# Patient Record
Sex: Female | Born: 1955 | Race: White | Hispanic: No | Smoking: Former smoker
Health system: Southern US, Community
[De-identification: ages and names within clinical notes are randomized; demographics above are authoritative.]

## PROBLEM LIST (undated history)

## (undated) DIAGNOSIS — M1990 Unspecified osteoarthritis, unspecified site: Secondary | ICD-10-CM

## (undated) DIAGNOSIS — IMO0002 Reserved for concepts with insufficient information to code with codable children: Secondary | ICD-10-CM

## (undated) DIAGNOSIS — F419 Anxiety disorder, unspecified: Secondary | ICD-10-CM

## (undated) DIAGNOSIS — Z87448 Personal history of other diseases of urinary system: Secondary | ICD-10-CM

## (undated) DIAGNOSIS — E669 Obesity, unspecified: Secondary | ICD-10-CM

## (undated) DIAGNOSIS — I889 Nonspecific lymphadenitis, unspecified: Secondary | ICD-10-CM

## (undated) DIAGNOSIS — F329 Major depressive disorder, single episode, unspecified: Secondary | ICD-10-CM

## (undated) DIAGNOSIS — M545 Low back pain: Secondary | ICD-10-CM

## (undated) DIAGNOSIS — B351 Tinea unguium: Secondary | ICD-10-CM

## (undated) DIAGNOSIS — IMO0001 Reserved for inherently not codable concepts without codable children: Secondary | ICD-10-CM

## (undated) DIAGNOSIS — G2581 Restless legs syndrome: Secondary | ICD-10-CM

## (undated) DIAGNOSIS — R1011 Right upper quadrant pain: Secondary | ICD-10-CM

## (undated) DIAGNOSIS — M25552 Pain in left hip: Secondary | ICD-10-CM

## (undated) DIAGNOSIS — R350 Frequency of micturition: Secondary | ICD-10-CM

## (undated) DIAGNOSIS — Z0001 Encounter for general adult medical examination with abnormal findings: Secondary | ICD-10-CM

## (undated) DIAGNOSIS — E785 Hyperlipidemia, unspecified: Secondary | ICD-10-CM

## (undated) DIAGNOSIS — B354 Tinea corporis: Secondary | ICD-10-CM

## (undated) DIAGNOSIS — R600 Localized edema: Secondary | ICD-10-CM

## (undated) DIAGNOSIS — H6090 Unspecified otitis externa, unspecified ear: Secondary | ICD-10-CM

## (undated) DIAGNOSIS — G4700 Insomnia, unspecified: Secondary | ICD-10-CM

## (undated) DIAGNOSIS — G4730 Sleep apnea, unspecified: Secondary | ICD-10-CM

## (undated) DIAGNOSIS — K219 Gastro-esophageal reflux disease without esophagitis: Secondary | ICD-10-CM

## (undated) DIAGNOSIS — K5900 Constipation, unspecified: Secondary | ICD-10-CM

## (undated) DIAGNOSIS — K1370 Unspecified lesions of oral mucosa: Secondary | ICD-10-CM

## (undated) DIAGNOSIS — N951 Menopausal and female climacteric states: Secondary | ICD-10-CM

## (undated) DIAGNOSIS — F32A Depression, unspecified: Secondary | ICD-10-CM

## (undated) DIAGNOSIS — T7840XA Allergy, unspecified, initial encounter: Secondary | ICD-10-CM

## (undated) DIAGNOSIS — J019 Acute sinusitis, unspecified: Secondary | ICD-10-CM

## (undated) HISTORY — DX: Nonspecific lymphadenitis, unspecified: I88.9

## (undated) HISTORY — DX: Menopausal and female climacteric states: N95.1

## (undated) HISTORY — DX: Personal history of other diseases of urinary system: Z87.448

## (undated) HISTORY — DX: Right upper quadrant pain: R10.11

## (undated) HISTORY — DX: Tinea unguium: B35.1

## (undated) HISTORY — DX: Unspecified otitis externa, unspecified ear: H60.90

## (undated) HISTORY — DX: Insomnia, unspecified: G47.00

## (undated) HISTORY — DX: Reserved for inherently not codable concepts without codable children: IMO0001

## (undated) HISTORY — DX: Allergy, unspecified, initial encounter: T78.40XA

## (undated) HISTORY — DX: Obesity, unspecified: E66.9

## (undated) HISTORY — DX: Restless legs syndrome: G25.81

## (undated) HISTORY — DX: Low back pain: M54.5

## (undated) HISTORY — DX: Reserved for concepts with insufficient information to code with codable children: IMO0002

## (undated) HISTORY — DX: Anxiety disorder, unspecified: F41.9

## (undated) HISTORY — DX: Constipation, unspecified: K59.00

## (undated) HISTORY — DX: Localized edema: R60.0

## (undated) HISTORY — DX: Tinea corporis: B35.4

## (undated) HISTORY — DX: Depression, unspecified: F32.A

## (undated) HISTORY — DX: Sleep apnea, unspecified: G47.30

## (undated) HISTORY — DX: Unspecified osteoarthritis, unspecified site: M19.90

## (undated) HISTORY — DX: Major depressive disorder, single episode, unspecified: F32.9

## (undated) HISTORY — DX: Hyperlipidemia, unspecified: E78.5

## (undated) HISTORY — DX: Frequency of micturition: R35.0

## (undated) HISTORY — DX: Pain in left hip: M25.552

## (undated) HISTORY — DX: Acute sinusitis, unspecified: J01.90

## (undated) HISTORY — DX: Encounter for general adult medical examination with abnormal findings: Z00.01

## (undated) HISTORY — DX: Unspecified lesions of oral mucosa: K13.70

---

## 1991-07-13 HISTORY — PX: TUBAL LIGATION: SHX77

## 1998-07-21 ENCOUNTER — Encounter: Payer: Self-pay | Admitting: *Deleted

## 1998-07-21 ENCOUNTER — Ambulatory Visit (HOSPITAL_COMMUNITY): Admission: RE | Admit: 1998-07-21 | Discharge: 1998-07-21 | Payer: Self-pay | Admitting: *Deleted

## 1999-10-12 ENCOUNTER — Encounter: Payer: Self-pay | Admitting: *Deleted

## 1999-10-12 ENCOUNTER — Ambulatory Visit (HOSPITAL_COMMUNITY): Admission: RE | Admit: 1999-10-12 | Discharge: 1999-10-12 | Payer: Self-pay | Admitting: *Deleted

## 1999-10-20 ENCOUNTER — Encounter: Admission: RE | Admit: 1999-10-20 | Discharge: 1999-10-20 | Payer: Self-pay | Admitting: *Deleted

## 1999-10-20 ENCOUNTER — Encounter: Payer: Self-pay | Admitting: *Deleted

## 1999-11-04 ENCOUNTER — Other Ambulatory Visit: Admission: RE | Admit: 1999-11-04 | Discharge: 1999-11-04 | Payer: Self-pay | Admitting: *Deleted

## 2000-02-12 ENCOUNTER — Ambulatory Visit (HOSPITAL_COMMUNITY): Admission: RE | Admit: 2000-02-12 | Discharge: 2000-02-12 | Payer: Self-pay | Admitting: Internal Medicine

## 2000-02-12 ENCOUNTER — Encounter: Payer: Self-pay | Admitting: Internal Medicine

## 2000-06-13 ENCOUNTER — Encounter: Admission: RE | Admit: 2000-06-13 | Discharge: 2000-07-21 | Payer: Self-pay | Admitting: Podiatry

## 2000-11-08 ENCOUNTER — Emergency Department (HOSPITAL_COMMUNITY): Admission: EM | Admit: 2000-11-08 | Discharge: 2000-11-08 | Payer: Self-pay | Admitting: *Deleted

## 2000-12-29 ENCOUNTER — Encounter: Payer: Self-pay | Admitting: *Deleted

## 2000-12-29 ENCOUNTER — Ambulatory Visit (HOSPITAL_COMMUNITY): Admission: RE | Admit: 2000-12-29 | Discharge: 2000-12-29 | Payer: Self-pay | Admitting: *Deleted

## 2001-01-10 ENCOUNTER — Encounter: Admission: RE | Admit: 2001-01-10 | Discharge: 2001-01-10 | Payer: Self-pay | Admitting: *Deleted

## 2001-01-10 ENCOUNTER — Encounter: Payer: Self-pay | Admitting: *Deleted

## 2001-01-26 ENCOUNTER — Other Ambulatory Visit: Admission: RE | Admit: 2001-01-26 | Discharge: 2001-01-26 | Payer: Self-pay | Admitting: *Deleted

## 2002-01-19 ENCOUNTER — Encounter: Admission: RE | Admit: 2002-01-19 | Discharge: 2002-01-19 | Payer: Self-pay | Admitting: *Deleted

## 2002-01-19 ENCOUNTER — Encounter: Payer: Self-pay | Admitting: *Deleted

## 2002-04-09 ENCOUNTER — Other Ambulatory Visit: Admission: RE | Admit: 2002-04-09 | Discharge: 2002-04-09 | Payer: Self-pay | Admitting: *Deleted

## 2003-02-13 ENCOUNTER — Encounter: Payer: Self-pay | Admitting: *Deleted

## 2003-02-13 ENCOUNTER — Encounter: Admission: RE | Admit: 2003-02-13 | Discharge: 2003-02-13 | Payer: Self-pay | Admitting: *Deleted

## 2003-04-11 ENCOUNTER — Other Ambulatory Visit: Admission: RE | Admit: 2003-04-11 | Discharge: 2003-04-11 | Payer: Self-pay | Admitting: *Deleted

## 2004-02-24 ENCOUNTER — Encounter: Admission: RE | Admit: 2004-02-24 | Discharge: 2004-02-24 | Payer: Self-pay | Admitting: *Deleted

## 2004-04-13 ENCOUNTER — Other Ambulatory Visit: Admission: RE | Admit: 2004-04-13 | Discharge: 2004-04-13 | Payer: Self-pay | Admitting: *Deleted

## 2004-07-12 HISTORY — PX: CARPAL TUNNEL RELEASE: SHX101

## 2004-07-12 HISTORY — PX: BACK SURGERY: SHX140

## 2005-03-31 ENCOUNTER — Encounter: Admission: RE | Admit: 2005-03-31 | Discharge: 2005-03-31 | Payer: Self-pay | Admitting: Obstetrics and Gynecology

## 2005-05-21 ENCOUNTER — Other Ambulatory Visit: Admission: RE | Admit: 2005-05-21 | Discharge: 2005-05-21 | Payer: Self-pay | Admitting: Obstetrics and Gynecology

## 2005-07-08 ENCOUNTER — Emergency Department (HOSPITAL_COMMUNITY): Admission: EM | Admit: 2005-07-08 | Discharge: 2005-07-08 | Payer: Self-pay | Admitting: Emergency Medicine

## 2005-07-25 ENCOUNTER — Ambulatory Visit (HOSPITAL_COMMUNITY): Admission: RE | Admit: 2005-07-25 | Discharge: 2005-07-25 | Payer: Self-pay | Admitting: Specialist

## 2005-08-05 ENCOUNTER — Encounter: Admission: RE | Admit: 2005-08-05 | Discharge: 2005-08-05 | Payer: Self-pay | Admitting: Obstetrics and Gynecology

## 2005-08-25 ENCOUNTER — Ambulatory Visit (HOSPITAL_COMMUNITY): Admission: RE | Admit: 2005-08-25 | Discharge: 2005-08-26 | Payer: Self-pay | Admitting: Specialist

## 2005-09-13 ENCOUNTER — Encounter: Admission: RE | Admit: 2005-09-13 | Discharge: 2005-09-13 | Payer: Self-pay | Admitting: Obstetrics and Gynecology

## 2006-04-25 ENCOUNTER — Encounter: Admission: RE | Admit: 2006-04-25 | Discharge: 2006-04-25 | Payer: Self-pay | Admitting: Obstetrics and Gynecology

## 2006-06-22 ENCOUNTER — Other Ambulatory Visit: Admission: RE | Admit: 2006-06-22 | Discharge: 2006-06-22 | Payer: Self-pay | Admitting: Obstetrics and Gynecology

## 2007-02-09 ENCOUNTER — Encounter: Admission: RE | Admit: 2007-02-09 | Discharge: 2007-02-09 | Payer: Self-pay | Admitting: Internal Medicine

## 2007-03-04 ENCOUNTER — Emergency Department (HOSPITAL_COMMUNITY): Admission: EM | Admit: 2007-03-04 | Discharge: 2007-03-04 | Payer: Self-pay | Admitting: Emergency Medicine

## 2007-05-23 ENCOUNTER — Encounter: Admission: RE | Admit: 2007-05-23 | Discharge: 2007-05-23 | Payer: Self-pay | Admitting: Obstetrics and Gynecology

## 2007-09-07 ENCOUNTER — Encounter: Admission: RE | Admit: 2007-09-07 | Discharge: 2007-11-01 | Payer: Self-pay | Admitting: Orthopedic Surgery

## 2007-09-20 ENCOUNTER — Other Ambulatory Visit: Admission: RE | Admit: 2007-09-20 | Discharge: 2007-09-20 | Payer: Self-pay | Admitting: Obstetrics and Gynecology

## 2008-06-04 ENCOUNTER — Encounter: Admission: RE | Admit: 2008-06-04 | Discharge: 2008-06-04 | Payer: Self-pay | Admitting: Obstetrics and Gynecology

## 2008-10-02 ENCOUNTER — Other Ambulatory Visit: Admission: RE | Admit: 2008-10-02 | Discharge: 2008-10-02 | Payer: Self-pay | Admitting: Obstetrics and Gynecology

## 2008-11-19 ENCOUNTER — Encounter: Payer: Self-pay | Admitting: Gastroenterology

## 2009-01-24 ENCOUNTER — Encounter: Payer: Self-pay | Admitting: Gastroenterology

## 2009-01-28 ENCOUNTER — Ambulatory Visit: Payer: Self-pay | Admitting: Gastroenterology

## 2009-02-06 ENCOUNTER — Ambulatory Visit: Payer: Self-pay | Admitting: Gastroenterology

## 2009-06-12 ENCOUNTER — Encounter: Admission: RE | Admit: 2009-06-12 | Discharge: 2009-06-12 | Payer: Self-pay | Admitting: Obstetrics and Gynecology

## 2009-06-20 ENCOUNTER — Encounter: Admission: RE | Admit: 2009-06-20 | Discharge: 2009-06-20 | Payer: Self-pay | Admitting: Obstetrics and Gynecology

## 2010-03-12 ENCOUNTER — Ambulatory Visit (HOSPITAL_COMMUNITY): Admission: RE | Admit: 2010-03-12 | Discharge: 2010-03-12 | Payer: Self-pay | Admitting: Specialist

## 2010-03-12 LAB — HM PAP SMEAR

## 2010-06-25 ENCOUNTER — Encounter
Admission: RE | Admit: 2010-06-25 | Discharge: 2010-06-25 | Payer: Self-pay | Source: Home / Self Care | Attending: Obstetrics and Gynecology | Admitting: Obstetrics and Gynecology

## 2010-11-27 NOTE — Op Note (Signed)
NAME:  [name], [name]               ACCOUNT NO.:  [id number]   MEDICAL RECORD NO.:  [id number]          PATIENT TYPE:  OIB   LOCATION:  1008                         FACILITY:  [facility]   PHYSICIAN:  Jene Every, M.D.    DATE OF BIRTH:  28-Mar-1956   DATE OF PROCEDURE:  08/25/2005  DATE OF DISCHARGE:                                 OPERATIVE REPORT   PREOPERATIVE DIAGNOSIS:  Spinal stenosis, herniated nucleus pulposus L5-S1  left.   POSTOPERATIVE DIAGNOSIS:  Spinal stenosis, herniated nucleus pulposus L5-S1  left.   PROCEDURE:  1.  Lateral recess decompression L5-S1, foraminotomy at L5 and S1.  2.  Partial excision of posterior element.  3.  Microdiskectomy L5-S1left.   ANESTHESIA:  General.   ASSISTANT:  Roma Schanz, P.A.   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  Operative intervention was indicated for decompression of the S1 nerve root,  bilateral recess decompression, microdiskectomy and foraminotomy. She had  significant disk degeneration at L5-S1, had minimal back pain. The S1 nerve  root was indicated for decompression. The risks and benefits were discussed  including bleeding, infection, injury to vascular structures, CSF leakage,  epidural fibrosis, adjacent segment disease, persistent back pain requiring  fusion in the future, anesthetic complications, etc.   TECHNIQUE:  The patient in the supine position and after the induction of  adequate general anesthesia and 1 gram of Kefzol, she was placed prone on  the [place] frame, all bony prominences well padded. The lumbar region was  prepped and draped in the usual sterile fashion. Two 18 gauge spinal needles  were utilized to localize the 5-1 interspace and confirmed with x-ray. An  incision was then made from the spinous process of 5 to S1, subcutaneous  tissue was dissected, electrocautery was utilized to  achieve hemostasis. The  dorsolumbar fascia identified and divided in line with the skin incision.  The paraspinous muscle was elevated from the lamina of 5 and S1, McCullough  retractor was placed. A Penfield 4 placed in the intralaminar space with an  x-ray confirming the 5-1 interspace. The operating microscope was draped and  brought onto the surgical field. Due to her significant disk degeneration,  there was a narrowing of the intralaminar window. There was an oblique area  of the lamina of 5 with a small osteophyte off of the junction of the lamina  in the spinous process. This was removed with a 2 and a 3 mm Kerrison. Next,  hemilaminotomy was performed on the caudad edge of 5 with a 2 and a 3 mm  Kerrison detaching the ligamentum flavum. The ligamentum flavum was detached  from the cephalad edge of S1 and again there was a fairly oblique  orientation of the lamina here noted. Foraminotomy of S1 was performed. We  detached the ligamentum flavum removing it from the interspace. Gently  mobilized it medially, there was significant lateral recessed stenosis  noted. There was a fairly hard and degenerated disk noted at 5-1. The  foramen of  5 was opened and I passed a hockey stick there without  difficulty. Extensive epidural venous plexus and bipolar electrocautery  utilized to achieve hemostasis here and aid in the lysing of these  adhesions. After mobilization of the thecal sac and the root medially, it  was a hard protruded disk, it was mobilized with an Epstein and a curette.  We gently removed with a pituitary. The disk degeneration was to the point  where it did not pass into the disk space. There was no movement here noted.  A fair amount of calcification of this disk noted. After removal of the  herniated material, it was felt that there was decompression of the S and 5  nerve root by removal of the intralaminar ligamentum flavum and the lateral  recess and foraminotomy.  Inspection revealed no evidence of CSF leakage or  active bleeding. There was at least 1 cm of excursion in the 5-S1 roots  medial to the pedicle without difficulty. The disk space was copiously  irrigated with antibiotic irrigation. I swept beneath the thecal sac at the  foramen of 5-S1 behind the vertebral body of 5 and S1 searching for a free  fragment which was not found. Also in the axilla of the root and the  shoulder of the root. All hernia material had been excised and the residual  ventral prominence was secondary to osteophyte and calcified disk. Again the  disk space was copiously irrigated with antibiotic irrigation. Inspection  revealed no evidence of CSF leakage or active bleeding. Thrombin soaked  Gelfoam was placed in the laminotomy defect. The McCullough retractor was  removed, paraspinous muscles inspected with no evidence of active bleeding.  The dorsolumbar fascia was reapproximated with #0 Vicryl interrupted figure-  of-eight sutures, subcutaneous tissue reapproximated with 2-0 Vicryl simple  sutures. The skin was reapproximated with 4-0 subcuticular chromic, wound  reinforced with Steri-Strips, sterile dressing applied, placed supine on the  hospital bed, extubated without difficulty and transported to the recovery  room in satisfactory condition.   The patient tolerated the procedure well with no complications. Assistant  Roma Schanz, P.A., minimal blood loss.      Jene Every, M.D.  Electronically Signed     JB/MEDQ  D:  08/25/2005  T:  08/26/2005  Job:  [id number]

## 2011-05-06 ENCOUNTER — Ambulatory Visit (INDEPENDENT_AMBULATORY_CARE_PROVIDER_SITE_OTHER): Payer: 59 | Admitting: Family Medicine

## 2011-05-06 ENCOUNTER — Other Ambulatory Visit: Payer: Self-pay | Admitting: Family Medicine

## 2011-05-06 ENCOUNTER — Encounter: Payer: Self-pay | Admitting: Family Medicine

## 2011-05-06 VITALS — BP 100/68 | HR 80 | Temp 97.8°F | Ht 65.0 in | Wt 193.4 lb

## 2011-05-06 DIAGNOSIS — IMO0002 Reserved for concepts with insufficient information to code with codable children: Secondary | ICD-10-CM

## 2011-05-06 DIAGNOSIS — T7840XA Allergy, unspecified, initial encounter: Secondary | ICD-10-CM

## 2011-05-06 DIAGNOSIS — F329 Major depressive disorder, single episode, unspecified: Secondary | ICD-10-CM | POA: Insufficient documentation

## 2011-05-06 DIAGNOSIS — F341 Dysthymic disorder: Secondary | ICD-10-CM

## 2011-05-06 DIAGNOSIS — R609 Edema, unspecified: Secondary | ICD-10-CM

## 2011-05-06 DIAGNOSIS — Z23 Encounter for immunization: Secondary | ICD-10-CM

## 2011-05-06 DIAGNOSIS — Z87448 Personal history of other diseases of urinary system: Secondary | ICD-10-CM | POA: Insufficient documentation

## 2011-05-06 DIAGNOSIS — Z0000 Encounter for general adult medical examination without abnormal findings: Secondary | ICD-10-CM

## 2011-05-06 DIAGNOSIS — L03019 Cellulitis of unspecified finger: Secondary | ICD-10-CM

## 2011-05-06 DIAGNOSIS — B351 Tinea unguium: Secondary | ICD-10-CM

## 2011-05-06 DIAGNOSIS — M545 Low back pain, unspecified: Secondary | ICD-10-CM

## 2011-05-06 DIAGNOSIS — E669 Obesity, unspecified: Secondary | ICD-10-CM

## 2011-05-06 DIAGNOSIS — F419 Anxiety disorder, unspecified: Secondary | ICD-10-CM | POA: Insufficient documentation

## 2011-05-06 DIAGNOSIS — Z79899 Other long term (current) drug therapy: Secondary | ICD-10-CM

## 2011-05-06 DIAGNOSIS — R600 Localized edema: Secondary | ICD-10-CM

## 2011-05-06 DIAGNOSIS — G2581 Restless legs syndrome: Secondary | ICD-10-CM

## 2011-05-06 DIAGNOSIS — IMO0001 Reserved for inherently not codable concepts without codable children: Secondary | ICD-10-CM

## 2011-05-06 DIAGNOSIS — F418 Other specified anxiety disorders: Secondary | ICD-10-CM

## 2011-05-06 MED ORDER — BACITRACIN ZINC 500 UNIT/GM EX OINT: TOPICAL_OINTMENT | CUTANEOUS | Status: DC

## 2011-05-06 NOTE — Patient Instructions (Addendum)

## 2011-05-07 LAB — BASIC METABOLIC PANEL
BUN: 16 mg/dL (ref 6–23)
Calcium: 8.6 mg/dL (ref 8.4–10.5)
Creat: 0.8 mg/dL (ref 0.50–1.10)
Glucose, Bld: 79 mg/dL (ref 70–99)

## 2011-05-07 LAB — HEPATIC FUNCTION PANEL
Albumin: 4.3 g/dL (ref 3.5–5.2)
Bilirubin, Direct: 0.1 mg/dL (ref 0.0–0.3)
Total Bilirubin: 0.5 mg/dL (ref 0.3–1.2)

## 2011-05-07 LAB — PHOSPHORUS: Phosphorus: 2.8 mg/dL (ref 2.3–4.6)

## 2011-05-09 ENCOUNTER — Encounter: Payer: Self-pay | Admitting: Family Medicine

## 2011-05-09 DIAGNOSIS — M545 Low back pain, unspecified: Secondary | ICD-10-CM | POA: Insufficient documentation

## 2011-05-09 DIAGNOSIS — IMO0001 Reserved for inherently not codable concepts without codable children: Secondary | ICD-10-CM | POA: Insufficient documentation

## 2011-05-09 DIAGNOSIS — Z0000 Encounter for general adult medical examination without abnormal findings: Secondary | ICD-10-CM | POA: Insufficient documentation

## 2011-05-09 DIAGNOSIS — F418 Other specified anxiety disorders: Secondary | ICD-10-CM | POA: Insufficient documentation

## 2011-05-09 DIAGNOSIS — B351 Tinea unguium: Secondary | ICD-10-CM

## 2011-05-09 HISTORY — DX: Tinea unguium: B35.1

## 2011-05-09 HISTORY — DX: Low back pain, unspecified: M54.50

## 2011-05-09 NOTE — Assessment & Plan Note (Addendum)
Is pleased with her improvement on multiple meds, no changes today

## 2011-05-09 NOTE — Assessment & Plan Note (Signed)
Agrees to flu shot today, maintain heart healthy diet, increase exercise and wear seat belt

## 2011-05-09 NOTE — Assessment & Plan Note (Signed)
Soak in water and distilled white vinegar daily and apply vick's vapor rub report if no resolution

## 2011-05-09 NOTE — Assessment & Plan Note (Signed)
Soak in warm water and H2O2 daily cover with Bactroban ointment and report if symptoms worsen

## 2011-05-09 NOTE — Assessment & Plan Note (Signed)
Consider the DASH diet increased exercise and decreased po intake

## 2011-05-09 NOTE — Assessment & Plan Note (Signed)
May use OTC Loratadine or Cetirizine as needed

## 2011-05-09 NOTE — Assessment & Plan Note (Signed)
Symptoms c/w atrophic vaginitis is following with GYN she will discuss the possibility of adding Premarin Cream pv weekly to her regimen to see if that is helpful

## 2011-05-09 NOTE — Assessment & Plan Note (Signed)
Minimize sodium, elevate feet, try compression hose and consider diuretics prn

## 2011-05-09 NOTE — Assessment & Plan Note (Signed)
Well controlled on meds 

## 2011-05-09 NOTE — Progress Notes (Signed)
[name]  [id number] May 21, 1956 05/09/2011      Progress Note New Patient  Subjective  Chief Complaint  Chief Complaint  Patient presents with  . Establish Care    new patient    HPI  Patient is a 55 year old female who is in today for new patient appointment. She has a lot of medical problems but is managing them. She sees Dr. Claudia Desanctis if any of her ongoing depression. She takes multiple medications and does believe it is managing her depression. She is frustrated over weight gain. Advised to watch her by mouth intake but is not exercising due to low back pain.Marland Kitchen He denies chest pain, palpitations, shortness of breath, GI or GU complaints. She complains is very in a dry mucosa. Follows with Dr. Webb Silversmith of gynecology. The G2 P1 status post a miscarriage and a C-section. Has a distant history of abnormal Pap but has had normal Paps since her colposcopy was performed. Last Pap was September 2001 and was normal. She is maintained on Prometrium. She is complaining of thickening and yellowing of something else after her trip to the salon. Has some swelling and discomfort along the nailbed of her left fourth finger.  Past Medical History  Diagnosis Date  . Allergy     seasonal  . Anxiety   . Depression   . Hyperlipidemia   . History of proteinuria syndrome   . Edema extremities   . RLS (restless legs syndrome)   . Dyspareunia   . Obese   . Onychomycosis 05/09/2011    Past Surgical History  Procedure Date  . Cesarean section 1993  . Back surgery 2006    low, discectomy for herniated disc    Family History  Problem Relation Age of Onset  . Cancer Mother 52    thyroid  . Arthritis Mother     nreck, back  . Hepatitis Sister     Hepatitis C  . Diabetes Maternal Grandmother     History   Social History  . Marital Status: Married    Spouse Name: N/A    Number of Children: N/A  . Years of Education: N/A   Occupational History  . Not on file.   Social History  Main Topics  . Smoking status: Former Smoker -- 0.5 packs/day for 20 years    Types: Cigarettes    Quit date: 07/12/1992  . Smokeless tobacco: Never Used  . Alcohol Use: Yes     occasionaly  . Drug Use: No  . Sexually Active: Yes -- Female partner(s)   Other Topics Concern  . Not on file   Social History Narrative  . No narrative on file    No current outpatient prescriptions on file prior to visit.    Allergies  Allergen Reactions  . Ceclor (Cefaclor)     Tongue swell    Review of Systems  Review of Systems  Constitutional: Negative for fever, chills, weight loss and malaise/fatigue.  HENT: Negative for hearing loss, nosebleeds and congestion.   Eyes: Negative for discharge.  Respiratory: Negative for cough, sputum production, shortness of breath and wheezing.   Cardiovascular: Positive for leg swelling. Negative for chest pain, palpitations, orthopnea and claudication.  Gastrointestinal: Negative for heartburn, nausea, vomiting, abdominal pain, diarrhea, constipation and blood in stool.  Genitourinary: Negative for dysuria, urgency, frequency and hematuria.  Musculoskeletal: Negative for myalgias, back pain and falls.  Skin: Positive for rash.  Neurological: Negative for dizziness, tremors, sensory change, focal weakness, loss of consciousness, weakness  and headaches.  Endo/Heme/Allergies: Negative for polydipsia. Does not bruise/bleed easily.  Psychiatric/Behavioral: Negative for depression and suicidal ideas. The patient is not nervous/anxious and does not have insomnia.     Objective  BP 100/68  Pulse 80  Temp(Src) 97.8 F (36.6 C) (Oral)  Ht 5\' 5"  (1.651 m)  Wt 193 lb 6.4 oz (87.726 kg)  BMI 32.18 kg/m2  SpO2 98%  Physical Exam  Physical Exam  Constitutional: She is oriented to person, place, and time and well-developed, well-nourished, and in no distress. No distress.  HENT:  Head: Normocephalic and atraumatic.  Right Ear: External ear normal.  Left  Ear: External ear normal.  Nose: Nose normal.  Mouth/Throat: Oropharynx is clear and moist. No oropharyngeal exudate.  Eyes: Conjunctivae are normal. Pupils are equal, round, and reactive to light. Right eye exhibits no discharge. Left eye exhibits no discharge. No scleral icterus.  Neck: Normal range of motion. Neck supple. No thyromegaly present.  Cardiovascular: Normal rate, regular rhythm, normal heart sounds and intact distal pulses.   No murmur heard. Pulmonary/Chest: Effort normal and breath sounds normal. No respiratory distress. She has no wheezes. She has no rales.  Abdominal: Soft. Bowel sounds are normal. She exhibits no distension and no mass. There is no tenderness.  Musculoskeletal: Normal range of motion. She exhibits no edema and no tenderness.  Lymphadenopathy:    She has no cervical adenopathy.  Neurological: She is alert and oriented to person, place, and time. She has normal reflexes. No cranial nerve deficit. Coordination normal.  Skin: Skin is warm and dry. No rash noted. She is not diaphoretic.       Swelling and red around base of nail on 4th finger left hand. No pus. Fingernails yellow and thickened  Psychiatric: Mood, memory and affect normal.       Assessment & Plan  Paronychia of fourth finger of left hand Soak in warm water and H2O2 daily cover with Bactroban ointment and report if symptoms worsen  Onychomycosis Soak in water and distilled white vinegar daily and apply vick's vapor rub report if no resolution  Depression with anxiety Is pleased with her improvement on multiple meds, no changes today  Dyspareunia Symptoms c/w atrophic vaginitis is following with GYN she will discuss the possibility of adding Premarin Cream pv weekly to her regimen to see if that is helpful  Allergic state May use OTC Loratadine or Cetirizine as needed  Obese Consider the DASH diet increased exercise and decreased po intake  Edema extremities Minimize sodium,  elevate feet, try compression hose and consider diuretics prn  Preventative health care Agrees to flu shot today, maintain heart healthy diet, increase exercise and wear seat belt  RLS (restless legs syndrome) Well controlled on meds

## 2011-06-04 ENCOUNTER — Telehealth: Payer: Self-pay | Admitting: Family Medicine

## 2011-06-04 ENCOUNTER — Other Ambulatory Visit: Payer: Self-pay | Admitting: *Deleted

## 2011-06-04 DIAGNOSIS — Z1231 Encounter for screening mammogram for malignant neoplasm of breast: Secondary | ICD-10-CM

## 2011-06-04 NOTE — Telephone Encounter (Signed)
Please advise 

## 2011-06-04 NOTE — Telephone Encounter (Signed)
Patient has been using the medicine that Dr Abner Greenspan prescribed for her fingers. They are getting infected looking, red & swollen. Can a stronger Rx be sent [facility] to [facility]?

## 2011-06-07 ENCOUNTER — Telehealth: Payer: Self-pay | Admitting: Family Medicine

## 2011-06-07 MED ORDER — CEPHALEXIN 500 MG PO CAPS: 500.0000 mg | ORAL_CAPSULE | Freq: Three times a day (TID) | ORAL | Status: DC

## 2011-06-07 NOTE — Telephone Encounter (Signed)
Pt informed and RX sent to pharmacy  

## 2011-06-07 NOTE — Telephone Encounter (Signed)
Addended by: Court Joy on: 06/07/2011 05:03 PM   Modules accepted: Orders

## 2011-06-07 NOTE — Telephone Encounter (Signed)
Worsening Paronychia, patient requesting antibiotics to treat, agree to start one if patient still uncomfortable and she is welcome to come in for further management as needed

## 2011-06-07 NOTE — Telephone Encounter (Signed)
Pt states her middle finger and ring finger are red and swollen around fingernail. Pt states she has been soaking nail and doing everything MD told her to? [facility] [address]

## 2011-06-07 NOTE — Telephone Encounter (Signed)
Ok to start her on Keflex 500mg  po tid x 10 days and she is to call if no improvement

## 2011-06-29 ENCOUNTER — Other Ambulatory Visit: Payer: Self-pay | Admitting: Family Medicine

## 2011-06-29 ENCOUNTER — Ambulatory Visit
Admission: RE | Admit: 2011-06-29 | Discharge: 2011-06-29 | Disposition: A | Discharge status: Home / Self Care | Payer: 59 | Source: Ambulatory Visit | Attending: *Deleted | Admitting: *Deleted

## 2011-06-29 DIAGNOSIS — Z1231 Encounter for screening mammogram for malignant neoplasm of breast: Secondary | ICD-10-CM

## 2011-08-10 ENCOUNTER — Ambulatory Visit: Payer: 59 | Admitting: Family Medicine

## 2011-09-20 ENCOUNTER — Encounter: Payer: Self-pay | Admitting: Family Medicine

## 2011-09-20 ENCOUNTER — Ambulatory Visit (INDEPENDENT_AMBULATORY_CARE_PROVIDER_SITE_OTHER): Payer: PRIVATE HEALTH INSURANCE | Admitting: Family Medicine

## 2011-09-20 VITALS — BP 121/68 | HR 78 | Temp 99.2°F | Ht 65.0 in | Wt 191.8 lb

## 2011-09-20 DIAGNOSIS — R1013 Epigastric pain: Secondary | ICD-10-CM

## 2011-09-20 DIAGNOSIS — R1011 Right upper quadrant pain: Secondary | ICD-10-CM

## 2011-09-20 HISTORY — DX: Right upper quadrant pain: R10.11

## 2011-09-20 MED ORDER — TRAMADOL HCL 50 MG PO TABS: 50.0000 mg | ORAL_TABLET | Freq: Three times a day (TID) | ORAL | Status: DC | PRN

## 2011-09-20 NOTE — Patient Instructions (Signed)
Cholecystitis   Cholecystitis is swelling and irritation (inflammation) of your gallbladder. This often happens when gallstones or sludge build up in the gallbladder. Treatment is needed right away.  HOME CARE  Home care depends on how you were treated. In general:   If you were given antibiotic medicine, take it as told. Finish the medicine even if you start to feel better.   Only take medicines as told by your doctor.   Eat low-fat foods until your next doctor visit.   Keep all doctor visits as told.  GET HELP RIGHT AWAY IF:   You have more pain and medicine does not help.   Your pain moves to a different part of your belly (abdomen) or to your back.   You have a fever.   You feel sick to your stomach (nauseous).   You throw up (vomit).  MAKE SURE YOU:   Understand these instructions.   Will watch your condition.   Will get help right away if you are not doing well or get worse.  Document Released: 06/17/2011 Document Reviewed: 06/15/2011  [employer] Patient Information 2012 [employer]

## 2011-09-20 NOTE — Assessment & Plan Note (Signed)
Intermittent and lasting 1-5 minutes when it occurs, she denies any correlation with eating. Will order a complete US and she should avoid fatty and spicy foods, she is given a small amount of Tramadol for severe pain. Call if symptoms worsen

## 2011-09-20 NOTE — Progress Notes (Signed)
Patient ID: [name], female   DOB: 1956/05/08, 56 y.o.   MRN: [id number] [name] [id number] 1956/01/25 09/20/2011      Progress Note-Follow Up  Subjective  Chief Complaint  Chief Complaint  Patient presents with  . pain in upper right side    radiates to back    HPI  Patient is a 55 year old Caucasian female who is in today with intermittent right upper quadrant pain since mid January. She describes it as lasting one to 5 minutes when it occurs. It is it's sharp sometimes squeezing pain in the anterior or posterior in the right upper quadrant. Tylenol stop helpful. Changing positions does not changed in character or intensity of pain. The pain has been severe enough to cause him nausea time but never any vomiting. She has noted that her bowels slowly continue daily have been somewhat softer lately. No bloody or tarry stool noted. No other c/o. Past Medical History  Diagnosis Date  . Allergy     seasonal  . Anxiety   . Depression   . Hyperlipidemia   . History of proteinuria syndrome   . Edema extremities   . RLS (restless legs syndrome)   . Dyspareunia   . Obese   . Onychomycosis 05/09/2011  . Low back pain 05/09/2011  . RUQ pain 09/20/2011    Past Surgical History  Procedure Date  . Cesarean section 1993  . Back surgery 2006    low, discectomy for herniated disc    Family History  Problem Relation Age of Onset  . Cancer Mother 77    thyroid  . Arthritis Mother     nreck, back  . Hepatitis Sister     Hepatitis C  . Diabetes Maternal Grandmother     History   Social History  . Marital Status: Married    Spouse Name: N/A    Number of Children: N/A  . Years of Education: N/A   Occupational History  . Not on file.   Social History Main Topics  . Smoking status: Former Smoker -- 0.5 packs/day for 20 years    Types: Cigarettes    Quit date: 07/12/1992  . Smokeless tobacco: Never Used  . Alcohol Use: Yes     occasionaly  . Drug Use: No  .  Sexually Active: Yes -- Female partner(s)   Other Topics Concern  . Not on file   Social History Narrative  . No narrative on file    Current Outpatient Prescriptions on File Prior to Visit  Medication Sig Dispense Refill  . ALPRAZolam (XANAX) 0.5 MG tablet Take 0.5 mg by mouth at bedtime as needed.        . Biotin 1000 MCG tablet Take 1,000 mcg by mouth daily.        . Calcium Carbonate-Vitamin D (CALTRATE 600+D) 600-400 MG-UNIT per tablet Take 1 tablet by mouth daily.        . Cholecalciferol (VITAMIN D3) 1000 UNITS CAPS Take by mouth.        . DULoxetine (CYMBALTA) 60 MG capsule Take 60 mg by mouth daily.        . Esomeprazole Magnesium (NEXIUM PO) Take by mouth daily.        Marland Kitchen FLAXSEED, LINSEED, PO Take by mouth.        . furosemide (LASIX) 40 MG tablet Take 40 mg by mouth daily.        Marland Kitchen lamoTRIgine (LAMICTAL) 200 MG tablet Take 200 mg by mouth daily.        Marland Kitchen  Omega-3 Fatty Acids (FISH OIL) 1000 MG CAPS Take 1 capsule by mouth daily.        . potassium chloride SA (K-DUR,KLOR-CON) 20 MEQ tablet Take 20 mEq by mouth daily.        . progesterone (PROMETRIUM) 200 MG capsule Take 200 mg by mouth as directed.        Marland Kitchen QUEtiapine (SEROQUEL) 100 MG tablet Take 100 mg by mouth at bedtime.        Marland Kitchen rOPINIRole (REQUIP) 1 MG tablet Take 1 mg by mouth daily.        . simvastatin (ZOCOR) 20 MG tablet Take 20 mg by mouth at bedtime.        . vitamin E 100 UNIT capsule Take 100 Units by mouth daily.          Allergies  Allergen Reactions  . Ceclor (Cefaclor)     Tongue swell    Review of Systems  Review of Systems  Constitutional: Negative for fever and malaise/fatigue.  HENT: Negative for congestion.   Eyes: Negative for discharge.  Respiratory: Negative for shortness of breath.   Cardiovascular: Negative for chest pain, palpitations and leg swelling.  Gastrointestinal: Positive for heartburn and abdominal pain. Negative for nausea, diarrhea, constipation, blood in stool and melena.         Ruq pain, lasts 1-5 minutes when it occurs and typically occurs at some point every day. No direct correlation to eating or pain are noted.  Genitourinary: Negative for dysuria.  Musculoskeletal: Negative for falls.  Skin: Negative for rash.  Neurological: Negative for loss of consciousness and headaches.  Endo/Heme/Allergies: Negative for polydipsia.  Psychiatric/Behavioral: Negative for depression and suicidal ideas. The patient is not nervous/anxious and does not have insomnia.     Objective  BP 121/68  Pulse 78  Temp(Src) 99.2 F (37.3 C) (Temporal)  Ht 5\' 5"  (1.651 m)  Wt 191 lb 12.8 oz (87 kg)  BMI 31.92 kg/m2  SpO2 99%  Physical Exam  Physical Exam  Constitutional: She is oriented to person, place, and time and well-developed, well-nourished, and in no distress. No distress.  HENT:  Head: Normocephalic and atraumatic.  Eyes: Conjunctivae are normal.  Neck: Neck supple. No thyromegaly present.  Cardiovascular: Normal rate, regular rhythm and normal heart sounds.   No murmur heard. Pulmonary/Chest: Effort normal and breath sounds normal. She has no wheezes.  Abdominal: Soft. Bowel sounds are normal. She exhibits no distension and no mass. There is tenderness. There is no rebound and no guarding.       Mildly tender with palp in epigastrium  Musculoskeletal: She exhibits no edema.  Lymphadenopathy:    She has no cervical adenopathy.  Neurological: She is alert and oriented to person, place, and time.  Skin: Skin is warm and dry. No rash noted. She is not diaphoretic.  Psychiatric: Memory, affect and judgment normal.    No results found for this basename: TSH   No results found for this basename: WBC, HGB, HCT, MCV, PLT   Lab Results  Component Value Date   CREATININE 0.80 05/06/2011   BUN 16 05/06/2011   NA 138 05/06/2011   K 4.2 05/06/2011   CL 102 05/06/2011   CO2 25 05/06/2011   Lab Results  Component Value Date   ALT 11 05/06/2011   AST 17  05/06/2011   ALKPHOS 43 05/06/2011   BILITOT 0.5 05/06/2011      Assessment & Plan  RUQ pain Intermittent and lasting 1-5 minutes  when it occurs, she denies any correlation with eating. Will order a complete US and she should avoid fatty and spicy foods, she is given a small amount of Tramadol for severe pain. Call if symptoms worsen

## 2011-09-22 ENCOUNTER — Ambulatory Visit (HOSPITAL_BASED_OUTPATIENT_CLINIC_OR_DEPARTMENT_OTHER)
Admission: RE | Admit: 2011-09-22 | Discharge: 2011-09-22 | Disposition: A | Discharge status: Home / Self Care | Payer: PRIVATE HEALTH INSURANCE | Source: Ambulatory Visit | Attending: Family Medicine | Admitting: Family Medicine

## 2011-09-22 ENCOUNTER — Telehealth: Payer: Self-pay | Admitting: Family Medicine

## 2011-09-22 DIAGNOSIS — R1013 Epigastric pain: Secondary | ICD-10-CM

## 2011-09-22 DIAGNOSIS — R1011 Right upper quadrant pain: Secondary | ICD-10-CM

## 2011-09-22 DIAGNOSIS — R110 Nausea: Secondary | ICD-10-CM | POA: Insufficient documentation

## 2011-09-22 LAB — HEPATIC FUNCTION PANEL
ALT: 22 U/L (ref 0–35)
AST: 26 U/L (ref 0–37)
Alkaline Phosphatase: 47 U/L (ref 39–117)
Bilirubin, Direct: 0.1 mg/dL (ref 0.0–0.3)
Indirect Bilirubin: 0.4 mg/dL (ref 0.0–0.9)
Total Protein: 6.7 g/dL (ref 6.0–8.3)

## 2011-09-22 NOTE — Progress Notes (Signed)
Addended by: Luisa Dago on: 09/22/2011 10:13 AM   Modules accepted: Orders

## 2011-09-22 NOTE — Telephone Encounter (Signed)
Will inform pt about [place] results

## 2011-09-23 LAB — CBC WITH DIFFERENTIAL/PLATELET
Eosinophils Absolute: 0.2 10*3/uL (ref 0.0–0.7)
Eosinophils Relative: 3 % (ref 0–5)
HCT: 41.2 % (ref 36.0–46.0)
Lymphs Abs: 2 10*3/uL (ref 0.7–4.0)
MCH: 29.2 pg (ref 26.0–34.0)
MCV: 90.5 fL (ref 78.0–100.0)
Monocytes Absolute: 0.7 10*3/uL (ref 0.1–1.0)
Monocytes Relative: 9 % (ref 3–12)
Platelets: 252 10*3/uL (ref 150–400)
RBC: 4.55 MIL/uL (ref 3.87–5.11)

## 2011-11-01 ENCOUNTER — Encounter: Payer: Self-pay | Admitting: Family Medicine

## 2011-11-01 ENCOUNTER — Ambulatory Visit (INDEPENDENT_AMBULATORY_CARE_PROVIDER_SITE_OTHER): Payer: PRIVATE HEALTH INSURANCE | Admitting: Family Medicine

## 2011-11-01 VITALS — BP 120/84 | HR 81 | Temp 98.0°F | Ht 65.0 in | Wt 190.1 lb

## 2011-11-01 DIAGNOSIS — R1011 Right upper quadrant pain: Secondary | ICD-10-CM

## 2011-11-01 DIAGNOSIS — F418 Other specified anxiety disorders: Secondary | ICD-10-CM

## 2011-11-01 DIAGNOSIS — F411 Generalized anxiety disorder: Secondary | ICD-10-CM

## 2011-11-01 DIAGNOSIS — R600 Localized edema: Secondary | ICD-10-CM

## 2011-11-01 DIAGNOSIS — M25559 Pain in unspecified hip: Secondary | ICD-10-CM

## 2011-11-01 DIAGNOSIS — E785 Hyperlipidemia, unspecified: Secondary | ICD-10-CM

## 2011-11-01 DIAGNOSIS — M25552 Pain in left hip: Secondary | ICD-10-CM

## 2011-11-01 DIAGNOSIS — M545 Low back pain: Secondary | ICD-10-CM

## 2011-11-01 DIAGNOSIS — E669 Obesity, unspecified: Secondary | ICD-10-CM

## 2011-11-01 DIAGNOSIS — F341 Dysthymic disorder: Secondary | ICD-10-CM

## 2011-11-01 DIAGNOSIS — R1013 Epigastric pain: Secondary | ICD-10-CM

## 2011-11-01 DIAGNOSIS — Z0000 Encounter for general adult medical examination without abnormal findings: Secondary | ICD-10-CM

## 2011-11-01 DIAGNOSIS — R609 Edema, unspecified: Secondary | ICD-10-CM

## 2011-11-01 DIAGNOSIS — F419 Anxiety disorder, unspecified: Secondary | ICD-10-CM

## 2011-11-01 DIAGNOSIS — R32 Unspecified urinary incontinence: Secondary | ICD-10-CM

## 2011-11-01 HISTORY — DX: Pain in left hip: M25.552

## 2011-11-01 LAB — POCT URINALYSIS DIPSTICK
Ketones, UA: NEGATIVE
Protein, UA: 100
Spec Grav, UA: 1.01
pH, UA: 5

## 2011-11-01 LAB — RENAL FUNCTION PANEL
Albumin: 4.5 g/dL (ref 3.5–5.2)
CO2: 29 mEq/L (ref 19–32)
Chloride: 101 mEq/L (ref 96–112)
Creat: 0.75 mg/dL (ref 0.50–1.10)
Glucose, Bld: 80 mg/dL (ref 70–99)

## 2011-11-01 LAB — LIPID PANEL
Cholesterol: 203 mg/dL — ABNORMAL HIGH (ref 0–200)
LDL Cholesterol: 100 mg/dL — ABNORMAL HIGH (ref 0–99)
Total CHOL/HDL Ratio: 3.7 Ratio
VLDL: 48 mg/dL — ABNORMAL HIGH (ref 0–40)

## 2011-11-01 MED ORDER — TRAMADOL HCL 50 MG PO TABS: 50.0000 mg | ORAL_TABLET | Freq: Three times a day (TID) | ORAL | Status: DC | PRN

## 2011-11-01 MED ORDER — FUROSEMIDE 40 MG PO TABS: ORAL_TABLET | ORAL | Status: DC

## 2011-11-01 MED ORDER — FUROSEMIDE 40 MG PO TABS: 40.0000 mg | ORAL_TABLET | Freq: Every day | ORAL | Status: DC

## 2011-11-01 MED ORDER — OMEPRAZOLE 40 MG PO CPDR: 40.0000 mg | DELAYED_RELEASE_CAPSULE | Freq: Every day | ORAL | Status: DC

## 2011-11-01 MED ORDER — SIMVASTATIN 20 MG PO TABS: 20.0000 mg | ORAL_TABLET | Freq: Every day | ORAL | Status: DC

## 2011-11-01 MED ORDER — ALPRAZOLAM 0.5 MG PO TABS: 0.5000 mg | ORAL_TABLET | Freq: Every evening | ORAL | Status: DC | PRN

## 2011-11-01 MED ORDER — POTASSIUM CHLORIDE CRYS ER 20 MEQ PO TBCR: EXTENDED_RELEASE_TABLET | ORAL | Status: DC

## 2011-11-01 MED ORDER — POTASSIUM CHLORIDE CRYS ER 20 MEQ PO TBCR: 20.0000 meq | EXTENDED_RELEASE_TABLET | Freq: Every day | ORAL | Status: DC

## 2011-11-01 NOTE — Assessment & Plan Note (Signed)
Check fasting labs, continues to follow up with GYN

## 2011-11-01 NOTE — Assessment & Plan Note (Signed)
Improved but still intermittently worsens continue current meds

## 2011-11-01 NOTE — Assessment & Plan Note (Signed)
Encouraged DASH diet and increased exercise 

## 2011-11-01 NOTE — Progress Notes (Signed)
Patient ID: [name], female   DOB: [birth year], 56 y.o.   MRN: [id number] [name] [id number] 14-Aug-1955 11/01/2011      Progress Note New Patient  Subjective  Chief Complaint  Chief Complaint  Patient presents with  . Annual Exam    physical    HPI  Patient is a 56 year old Caucasian female who is in today for an exam. Overall she reports she she is doing relatively well. Her low back pain is still present but is tolerable at this time. She does not chance of increasing left hip pain but is noting adequate relief when she takes an occasional tramadol or Tylenol. No radicular symptoms numbness or weakness down either leg. No incontinence noted. She denies any recent febrile illness and feels well otherwise. She does continue to take an occasional Xanax and needs a refill on this. Continues to struggle with pedal edema. And has done so for 20 years. She has days where the Lasix feels inadequate and her feet feel tight and uncomfortable. She denies shortness of breath, chest pain or palpitations, GI or GU complaints otherwise. She has not made any significant dietary changes although she is trying to move somewhat more. No significant flare in her allergies complains of congestion or headache are noted at today's visit  Past Medical History  Diagnosis Date  . Allergy     seasonal  . Anxiety   . Depression   . Hyperlipidemia   . History of proteinuria syndrome   . Edema extremities   . RLS (restless legs syndrome)   . Dyspareunia   . Obese   . Onychomycosis 05/09/2011  . Low back pain 05/09/2011  . RUQ pain 09/20/2011  . Hip pain, left 11/01/2011    Past Surgical History  Procedure Date  . Cesarean section 1993  . Back surgery 2006    low, discectomy for herniated disc    Family History  Problem Relation Age of Onset  . Cancer Mother 46    thyroid  . Arthritis Mother     nreck, back  . Hepatitis Sister     Hepatitis C  . Diabetes Maternal Grandmother      History   Social History  . Marital Status: Married    Spouse Name: N/A    Number of Children: N/A  . Years of Education: N/A   Occupational History  . Not on file.   Social History Main Topics  . Smoking status: Former Smoker -- 0.5 packs/day for 20 years    Types: Cigarettes    Quit date: 07/12/1992  . Smokeless tobacco: Never Used  . Alcohol Use: Yes     occasionaly  . Drug Use: No  . Sexually Active: Yes -- Female partner(s)   Other Topics Concern  . Not on file   Social History Narrative  . No narrative on file    Current Outpatient Prescriptions on File Prior to Visit  Medication Sig Dispense Refill  . Biotin 1000 MCG tablet Take 1,000 mcg by mouth daily.        . Calcium Carbonate-Vitamin D (CALTRATE 600+D) 600-400 MG-UNIT per tablet Take 1 tablet by mouth daily.        . Cholecalciferol (VITAMIN D3) 1000 UNITS CAPS Take by mouth.        . DULoxetine (CYMBALTA) 60 MG capsule Take 60 mg by mouth daily.        Marland Kitchen FLAXSEED, LINSEED, PO Take by mouth.        Marland Kitchen  lamoTRIgine (LAMICTAL) 200 MG tablet Take 200 mg by mouth daily.        . Omega-3 Fatty Acids (FISH OIL) 1000 MG CAPS Take 1 capsule by mouth daily.        . progesterone (PROMETRIUM) 200 MG capsule Take 200 mg by mouth as directed.        Marland Kitchen QUEtiapine (SEROQUEL) 100 MG tablet Take 100 mg by mouth at bedtime.        Marland Kitchen rOPINIRole (REQUIP) 1 MG tablet Take 1 mg by mouth daily.        . vitamin E 100 UNIT capsule Take 100 Units by mouth daily.        Marland Kitchen DISCONTD: furosemide (LASIX) 40 MG tablet Take 40 mg by mouth daily.        Marland Kitchen DISCONTD: potassium chloride SA (K-DUR,KLOR-CON) 20 MEQ tablet Take 20 mEq by mouth daily.        Marland Kitchen DISCONTD: simvastatin (ZOCOR) 20 MG tablet Take 20 mg by mouth at bedtime.        Marland Kitchen omeprazole (PRILOSEC) 40 MG capsule Take 1 capsule (40 mg total) by mouth daily.  30 capsule  5    Allergies  Allergen Reactions  . Ceclor (Cefaclor)     Tongue swell    Review of Systems  Review  of Systems  Constitutional: Negative for fever, chills and malaise/fatigue.  HENT: Negative for hearing loss, nosebleeds and congestion.   Eyes: Negative for discharge.  Respiratory: Negative for cough, sputum production, shortness of breath and wheezing.   Cardiovascular: Positive for leg swelling. Negative for chest pain and palpitations.  Gastrointestinal: Negative for heartburn, nausea, vomiting, abdominal pain, diarrhea, constipation and blood in stool.  Genitourinary: Negative for dysuria, urgency, frequency and hematuria.  Musculoskeletal: Negative for myalgias, back pain and falls.  Skin: Negative for rash.  Neurological: Negative for dizziness, tremors, sensory change, focal weakness, loss of consciousness, weakness and headaches.  Endo/Heme/Allergies: Negative for polydipsia. Does not bruise/bleed easily.  Psychiatric/Behavioral: Negative for depression and suicidal ideas. The patient is not nervous/anxious and does not have insomnia.     Objective  BP 120/84  Pulse 81  Temp(Src) 98 F (36.7 C) (Temporal)  Ht 5\' 5"  (1.651 m)  Wt 190 lb 1.9 oz (86.238 kg)  BMI 31.64 kg/m2  SpO2 99%  Physical Exam  Physical Exam  Constitutional: She is oriented to person, place, and time and well-developed, well-nourished, and in no distress. No distress.  HENT:  Head: Normocephalic and atraumatic.  Right Ear: External ear normal.  Left Ear: External ear normal.  Nose: Nose normal.  Mouth/Throat: Oropharynx is clear and moist. No oropharyngeal exudate.  Eyes: Conjunctivae are normal. Pupils are equal, round, and reactive to light. Right eye exhibits no discharge. Left eye exhibits no discharge. No scleral icterus.  Neck: Normal range of motion. Neck supple. No thyromegaly present.  Cardiovascular: Normal rate, regular rhythm, normal heart sounds and intact distal pulses.   No murmur heard. Pulmonary/Chest: Effort normal and breath sounds normal. No respiratory distress. She has no  wheezes. She has no rales.  Abdominal: Soft. Bowel sounds are normal. She exhibits no distension and no mass. There is no tenderness.  Musculoskeletal: Normal range of motion. She exhibits edema. She exhibits no tenderness.       1+ pedal edema b/l LE  Lymphadenopathy:    She has no cervical adenopathy.  Neurological: She is alert and oriented to person, place, and time. She has normal reflexes. No cranial nerve  deficit. Coordination normal.  Skin: Skin is warm and dry. No rash noted. She is not diaphoretic.  Psychiatric: Mood, memory and affect normal.       Assessment & Plan  Hip pain, left Left hip pain is tolerable with intermittent Tramadol and Tylenol. Report if symptoms worsen  Low back pain Improved but still intermittently worsens continue current meds   Depression with anxiety Doing well on current meds, no concerns today  Obese Encouraged DASH diet and increased exercise  Edema extremities Has a long history of edema and some days are worse, is give permission to take an extra Lasix and Potassium daily as needed  Preventative health care Check fasting labs, continues to follow up with GYN

## 2011-11-01 NOTE — Assessment & Plan Note (Signed)
Has a long history of edema and some days are worse, is give permission to take an extra Lasix and Potassium daily as needed

## 2011-11-01 NOTE — Assessment & Plan Note (Signed)
Left hip pain is tolerable with intermittent Tramadol and Tylenol. Report if symptoms worsen

## 2011-11-01 NOTE — Assessment & Plan Note (Signed)
Doing well on current meds, no concerns today

## 2011-11-01 NOTE — Patient Instructions (Signed)
Allergic Rhinitis Allergic rhinitis is when the mucous membranes in the nose respond to allergens. Allergens are particles in the air that cause your body to have an allergic reaction. This causes you to release allergic antibodies. Through a chain of events, these eventually cause you to release histamine into the blood stream (hence the use of antihistamines). Although meant to be protective to the body, it is this release that causes your discomfort, such as frequent sneezing, congestion and an itchy runny nose.  CAUSES  The pollen allergens may come from grasses, trees, and weeds. This is seasonal allergic rhinitis, or "hay fever." Other allergens cause year-round allergic rhinitis (perennial allergic rhinitis) such as house dust mite allergen, pet dander and mold spores.  SYMPTOMS   Nasal stuffiness (congestion).   Runny, itchy nose with sneezing and tearing of the eyes.   There is often an itching of the mouth, eyes and ears.  It cannot be cured, but it can be controlled with medications. DIAGNOSIS  If you are unable to determine the offending allergen, skin or blood testing may find it. TREATMENT   Avoid the allergen.   Medications and allergy shots (immunotherapy) can help.   Hay fever may often be treated with antihistamines in pill or nasal spray forms. Antihistamines block the effects of histamine. There are over-the-counter medicines that may help with nasal congestion and swelling around the eyes. Check with your caregiver before taking or giving this medicine.  If the treatment above does not work, there are many new medications your caregiver can prescribe. Stronger medications may be used if initial measures are ineffective. Desensitizing injections can be used if medications and avoidance fails. Desensitization is when a patient is given ongoing shots until the body becomes less sensitive to the allergen. Make sure you follow up with your caregiver if problems continue. SEEK  MEDICAL CARE IF:   You develop fever (more than 100.5 F (38.1 C).   You develop a cough that does not stop easily (persistent).   You have shortness of breath.   You start wheezing.   Symptoms interfere with normal daily activities.  Document Released: 03/23/2001 Document Revised: 06/17/2011 Document Reviewed: 10/02/2008 [facility] Patient Information 2012 [place], [place].  Try nasal saline twice daily, use Sudafed sparingly, increase Claritin to twice daily and notify us if not improving

## 2011-11-03 ENCOUNTER — Other Ambulatory Visit: Payer: Self-pay

## 2011-11-03 DIAGNOSIS — R1013 Epigastric pain: Secondary | ICD-10-CM

## 2011-11-03 LAB — URINE CULTURE
Colony Count: NO GROWTH
Organism ID, Bacteria: NO GROWTH

## 2011-11-03 NOTE — Telephone Encounter (Signed)
Pt informed that Omeprazole was sent to [facility].

## 2012-03-16 ENCOUNTER — Telehealth: Payer: Self-pay

## 2012-03-16 NOTE — Telephone Encounter (Signed)
I informed patient and she states that she will call Monday if not any better. I asked pt if she was sure she wanted to wait all weekend and she stated the headache she had this morning has eased up a little. Then patient stated that if it gets any worse tonight she would call in the morning to schedule an appt.

## 2012-03-16 NOTE — Telephone Encounter (Signed)
For a headache lasting several weeks I cannot give her good information/advice/meds unless I see her and do an exam. Can remind her to eat small, frequent meals with lean proteins and complex carbs (low or hi sugars can make HA worse), hydrate well with at least 64 oz of fluids daily and get 8 hours of sleep. Have her come in

## 2012-03-16 NOTE — Telephone Encounter (Signed)
Patient left a message stating that she has had a headache for the last several weeks. Pt also stated she took her BP and it was 120/66 and pulse 82. Patient stated she was scared about her pulse being 82?  I called patient and she states she was nauseas yesterday. Patient states that she saw spots yesterday with headache. Headache has been for 2 weeks. I informed pt that the pulse was okay and at her appt in April it was 19. Pt stated that made her feel better. I asked patient if she would like to come in and see one of the doctors today and she stated she would wait till after the weekend to see how she felt. I informed pt that if the headache got worse over the weekend and she was seeing spots, back, jaw or arm pain to go to ED. Pt voiced understanding.

## 2012-04-27 ENCOUNTER — Other Ambulatory Visit (INDEPENDENT_AMBULATORY_CARE_PROVIDER_SITE_OTHER): Payer: PRIVATE HEALTH INSURANCE

## 2012-04-27 DIAGNOSIS — E785 Hyperlipidemia, unspecified: Secondary | ICD-10-CM

## 2012-04-27 LAB — LIPID PANEL
HDL: 43.2 mg/dL (ref >39.00)
VLDL: 61.6 mg/dL — ABNORMAL HIGH (ref 0.0–40.0)

## 2012-04-27 LAB — LDL CHOLESTEROL, DIRECT: Direct LDL: 90.4 mg/dL

## 2012-05-02 ENCOUNTER — Ambulatory Visit (INDEPENDENT_AMBULATORY_CARE_PROVIDER_SITE_OTHER): Payer: PRIVATE HEALTH INSURANCE | Admitting: Family Medicine

## 2012-05-02 ENCOUNTER — Encounter: Payer: Self-pay | Admitting: Family Medicine

## 2012-05-02 VITALS — BP 82/56 | HR 82 | Temp 97.9°F | Ht 65.0 in | Wt 192.4 lb

## 2012-05-02 DIAGNOSIS — M25551 Pain in right hip: Secondary | ICD-10-CM

## 2012-05-02 DIAGNOSIS — M79662 Pain in left lower leg: Secondary | ICD-10-CM

## 2012-05-02 DIAGNOSIS — R600 Localized edema: Secondary | ICD-10-CM

## 2012-05-02 DIAGNOSIS — M79609 Pain in unspecified limb: Secondary | ICD-10-CM

## 2012-05-02 DIAGNOSIS — M545 Low back pain, unspecified: Secondary | ICD-10-CM

## 2012-05-02 DIAGNOSIS — I10 Essential (primary) hypertension: Secondary | ICD-10-CM

## 2012-05-02 DIAGNOSIS — M25559 Pain in unspecified hip: Secondary | ICD-10-CM

## 2012-05-02 DIAGNOSIS — R1013 Epigastric pain: Secondary | ICD-10-CM

## 2012-05-02 DIAGNOSIS — R609 Edema, unspecified: Secondary | ICD-10-CM

## 2012-05-02 DIAGNOSIS — E669 Obesity, unspecified: Secondary | ICD-10-CM

## 2012-05-02 DIAGNOSIS — Z23 Encounter for immunization: Secondary | ICD-10-CM

## 2012-05-02 DIAGNOSIS — R1011 Right upper quadrant pain: Secondary | ICD-10-CM

## 2012-05-02 DIAGNOSIS — M25552 Pain in left hip: Secondary | ICD-10-CM

## 2012-05-02 DIAGNOSIS — E785 Hyperlipidemia, unspecified: Secondary | ICD-10-CM

## 2012-05-02 MED ORDER — TRAMADOL HCL 50 MG PO TABS: 50.0000 mg | ORAL_TABLET | Freq: Three times a day (TID) | ORAL | Status: AC | PRN

## 2012-05-02 MED ORDER — FUROSEMIDE 40 MG PO TABS: ORAL_TABLET | ORAL | Status: DC

## 2012-05-02 MED ORDER — POTASSIUM CHLORIDE CRYS ER 20 MEQ PO TBCR: EXTENDED_RELEASE_TABLET | ORAL | Status: DC

## 2012-05-02 NOTE — Progress Notes (Signed)
Patient ID: [name], female   DOB: 1955-12-30, 56 y.o.   MRN: [id number] MALACHI SUDERMAN [id number] 06/05/1956 05/02/2012      Progress Note-Follow Up  Subjective  Chief Complaint  Chief Complaint  Patient presents with  . Follow-up    6 month  . Injections    flu    HPI  Patient is a 56 year old Caucasian female who is in today for followup with a few concerns. She notes roughly 4-6 weeks ago she jammed her second third and fourth toe on her left foot. She went to physiatry and her x-ray and she is unclear whether there was a fracture or not. It is slowly improving but she's had increased pedal edema in that leg ever since. Does complain of some posterior calf and posterior knee pain as well. She is persistent edema in both lower extremities which has responded to an increase in Lasix to 2 tabs daily she does have to do this very frequently now. She has up her potassium to 2 tabs daily as well and denies any chest pain, shortness of breath, palpitations, leg cramps or other new acute complaints today. She does have ongoing low back pain which has been present for years as well as bilateral hip pain. Left is worse than right. She has trouble finding a comfortable position at night secondary to being unable to lie in her left side at this time. She does get some relief with intermittent use of tramadol but is ready to proceed with orthopedic consultation and possible physical therapy for her persistent pain  Past Medical History  Diagnosis Date  . Allergy     seasonal  . Anxiety   . Depression   . Hyperlipidemia   . History of proteinuria syndrome   . Edema extremities   . RLS (restless legs syndrome)   . Dyspareunia   . Obese   . Onychomycosis 05/09/2011  . Low back pain 05/09/2011  . RUQ pain 09/20/2011  . Hip pain, left 11/01/2011    Past Surgical History  Procedure Date  . Cesarean section 1993  . Back surgery 2006    low, discectomy for herniated disc    Family  History  Problem Relation Age of Onset  . Cancer Mother 13    thyroid  . Arthritis Mother     nreck, back  . Hepatitis Sister     Hepatitis C  . Diabetes Maternal Grandmother     History   Social History  . Marital Status: Married    Spouse Name: N/A    Number of Children: N/A  . Years of Education: N/A   Occupational History  . Not on file.   Social History Main Topics  . Smoking status: Former Smoker -- 0.5 packs/day for 20 years    Types: Cigarettes    Quit date: 07/12/1992  . Smokeless tobacco: Never Used  . Alcohol Use: Yes     occasionaly  . Drug Use: No  . Sexually Active: Yes -- Female partner(s)   Other Topics Concern  . Not on file   Social History Narrative  . No narrative on file    Current Outpatient Prescriptions on File Prior to Visit  Medication Sig Dispense Refill  . ALPRAZolam (XANAX) 0.5 MG tablet Take 1 tablet (0.5 mg total) by mouth at bedtime as needed for sleep or anxiety.  30 tablet  5  . Calcium Carbonate-Vitamin D (CALTRATE 600+D) 600-400 MG-UNIT per tablet Take 1 tablet by mouth daily.       Marland Kitchen  Cholecalciferol (VITAMIN D3) 1000 UNITS CAPS Take by mouth.        Marland Kitchen FLAXSEED, LINSEED, PO Take by mouth every other day.       . lamoTRIgine (LAMICTAL) 200 MG tablet Take 200 mg by mouth daily.        Marland Kitchen omeprazole (PRILOSEC) 40 MG capsule Take 1 capsule (40 mg total) by mouth daily.  30 capsule  5  . progesterone (PROMETRIUM) 200 MG capsule Take 200 mg by mouth as directed.        Marland Kitchen QUEtiapine (SEROQUEL) 100 MG tablet Take 100 mg by mouth at bedtime.        Marland Kitchen rOPINIRole (REQUIP) 1 MG tablet Take 1 mg by mouth daily.        . simvastatin (ZOCOR) 20 MG tablet Take 1 tablet (20 mg total) by mouth at bedtime.  30 tablet  11  . venlafaxine XR (EFFEXOR-XR) 150 MG 24 hr capsule Take 150 mg by mouth daily.      Marland Kitchen DISCONTD: furosemide (LASIX) 40 MG tablet 1 tab po daily and a 2 nd tab daily prn increased edema  45 tablet  5  . DISCONTD: potassium chloride SA  (K-DUR,KLOR-CON) 20 MEQ tablet 1 tab po daily and a 2nd prn increased Lasix use  45 tablet  5    Allergies  Allergen Reactions  . Ceclor (Cefaclor)     Tongue swell    Review of Systems  Review of Systems  Constitutional: Negative for fever and malaise/fatigue.  HENT: Negative for congestion.   Eyes: Negative for discharge.  Respiratory: Negative for shortness of breath.   Cardiovascular: Positive for leg swelling. Negative for chest pain, palpitations and PND.  Gastrointestinal: Negative for nausea, abdominal pain and diarrhea.  Genitourinary: Negative for dysuria.  Musculoskeletal: Positive for myalgias, back pain and joint pain. Negative for falls.  Skin: Negative for rash.  Neurological: Negative for loss of consciousness and headaches.  Endo/Heme/Allergies: Negative for polydipsia.  Psychiatric/Behavioral: Negative for depression and suicidal ideas. The patient is not nervous/anxious and does not have insomnia.     Objective  BP 82/56  Pulse 82  Temp 97.9 F (36.6 C) (Temporal)  Ht 5\' 5"  (1.651 m)  Wt 192 lb 6.4 oz (87.272 kg)  BMI 32.02 kg/m2  SpO2 98%  Physical Exam  Physical Exam  Constitutional: She is oriented to person, place, and time and well-developed, well-nourished, and in no distress. No distress.  HENT:  Head: Normocephalic and atraumatic.  Eyes: Conjunctivae normal are normal.  Neck: Neck supple. No thyromegaly present.  Cardiovascular: Normal rate, regular rhythm and normal heart sounds.   No murmur heard. Pulmonary/Chest: Effort normal and breath sounds normal. She has no wheezes.  Abdominal: Bowel sounds are normal. She exhibits no distension and no mass.  Musculoskeletal: She exhibits edema and tenderness.       1-2 + pedal edema RLE, 2+ pedal edema LLE, mild pain with palp posterior left knee with swelling. Ecchymosis 2nd and 3 rd toe left foot  Lymphadenopathy:    She has no cervical adenopathy.  Neurological: She is alert and oriented to  person, place, and time.  Skin: Skin is warm and dry. No rash noted. She is not diaphoretic.  Psychiatric: Memory, affect and judgment normal.    Lab Results  Component Value Date   TSH 1.202 11/01/2011   Lab Results  Component Value Date   WBC 6.9 09/22/2011   HGB 13.3 09/22/2011   HCT 41.2 09/22/2011  MCV 90.5 09/22/2011   PLT 252 09/22/2011   Lab Results  Component Value Date   CREATININE 0.75 11/01/2011   BUN 14 11/01/2011   NA 139 11/01/2011   K 4.0 11/01/2011   CL 101 11/01/2011   CO2 29 11/01/2011   Lab Results  Component Value Date   ALT 22 09/22/2011   AST 26 09/22/2011   ALKPHOS 47 09/22/2011   BILITOT 0.5 09/22/2011   Lab Results  Component Value Date   CHOL 169 04/27/2012   Lab Results  Component Value Date   HDL 43.20 04/27/2012   Lab Results  Component Value Date   LDLCALC 100* 11/01/2011   Lab Results  Component Value Date   TRIG 308.0* 04/27/2012   Lab Results  Component Value Date   CHOLHDL 4 04/27/2012     Assessment & Plan  Edema extremities Worsening. Is not wearing the compression hose as recommended. She's not been watching her diet significantly. She has increased her furosemide to 2 pills daily along with two potassium tablets daily and does feel that helps some. Refills given today. We'll check a 2-D echo due to her hypertension and her worsening edema as well. Left leg is worse and she is having some discomfort and pain in posterior calf and behind knee. This started after she jammed her toes badly in this leg 4-6 weeks ago. Will check a lower extremity doppler  Hip pain, left Has trouble getting comfortable even at night, agrees to xray and referral to ortho at this time, is given a refill on Tramadol to use prn til then which she notes does help  Low back pain Persistence, encouraged ongoing attempts at weight loss and referred to ortho for further consideration  Obese Weight stable over past year. Encouraged DASH diet and decreased po  intake  Hyperlipidemia TGs up, avoid trans fats, minimize saturated fats and simple carbs, add a Krill oil cap daily and recheck in 3 months

## 2012-05-02 NOTE — Assessment & Plan Note (Signed)
Worsening. Is not wearing the compression hose as recommended. She's not been watching her diet significantly. She has increased her furosemide to 2 pills daily along with two potassium tablets daily and does feel that helps some. Refills given today. We'll check a 2-D echo due to her hypertension and her worsening edema as well. Left leg is worse and she is having some discomfort and pain in posterior calf and behind knee. This started after she jammed her toes badly in this leg 4-6 weeks ago. Will check a lower extremity doppler

## 2012-05-02 NOTE — Assessment & Plan Note (Signed)
Weight stable over past year. Encouraged DASH diet and decreased po intake

## 2012-05-02 NOTE — Assessment & Plan Note (Signed)
Persistence, encouraged ongoing attempts at weight loss and referred to ortho for further consideration

## 2012-05-02 NOTE — Assessment & Plan Note (Signed)
TGs up, avoid trans fats, minimize saturated fats and simple carbs, add a Krill oil cap daily and recheck in 3 months

## 2012-05-02 NOTE — Assessment & Plan Note (Signed)
Has trouble getting comfortable even at night, agrees to xray and referral to ortho at this time, is given a refill on Tramadol to use prn til then which she notes does help

## 2012-05-02 NOTE — Patient Instructions (Signed)
Start a Lawyer by [employer], 1 cap daily Minimize carbohydrate  Hip Pain The hips join the upper legs to the lower pelvis. The bones, cartilage, tendons, and muscles of the hip joint perform a lot of work each day holding your body weight and allowing you to move around. Hip pain is a common symptom. It can range from a minor ache to severe pain on 1 or both hips. Pain may be felt on the inside of the hip joint near the groin, or the outside near the buttocks and upper thigh. There may be swelling or stiffness as well. It occurs more often when a person walks or performs activity. There are many reasons hip pain can develop. CAUSES  It is important to work with your caregiver to identify the cause since many conditions can impact the bones, cartilage, muscles, and tendons of the hips. Causes for hip pain include:  Broken (fractured) bones.  Separation of the thighbone from the hip socket (dislocation).  Torn cartilage of the hip joint.  Swelling (inflammation) of a tendon (tendonitis), the sac within the hip joint (bursitis), or a joint.  A weakening in the abdominal wall (hernia), affecting the nerves to the hip.  Arthritis in the hip joint or lining of the hip joint.  Pinched nerves in the back, hip, or upper thigh.  A bulging disc in the spine (herniated disc).  Rarely, bone infection or cancer. DIAGNOSIS  The location of your hip pain will help your caregiver understand what may be causing the pain. A diagnosis is based on your medical history, your symptoms, results from your physical exam, and results from diagnostic tests. Diagnostic tests may include X-ray exams, a computerized magnetic scan (magnetic resonance imaging, MRI), or bone scan. TREATMENT  Treatment will depend on the cause of your hip pain. Treatment may include:  Limiting activities and resting until symptoms improve.  Crutches or other walking supports (a cane or brace).  Ice, elevation,  and compression.  Physical therapy or home exercises.  Shoe inserts or special shoes.  Losing weight.  Medications to reduce pain.  Undergoing surgery. HOME CARE INSTRUCTIONS   Only take over-the-counter or prescription medicines for pain, discomfort, or fever as directed by your caregiver.  Put ice on the injured area:  Put ice in a plastic bag.  Place a towel between your skin and the bag.  Leave the ice on for 15 to 20 minutes at a time, 3 to 4 times a day.  Keep your leg raised (elevated) when possible to lessen swelling.  Avoid activities that cause pain.  Follow specific exercises as directed by your caregiver.  Sleep with a pillow between your legs on your most comfortable side.  Record how often you have hip pain, the location of the pain, and what it feels like. This information may be helpful to you and your caregiver.  Ask your caregiver about returning to work or sports and whether you should drive.  Follow up with your caregiver for further exams, therapy, or testing as directed. SEEK MEDICAL CARE IF:   Your pain or swelling continues or worsens after 1 week.  You are feeling unwell or have chills.  You have increasing difficulty with walking.  You have a loss of sensation or other new symptoms.  You have questions or concerns. SEEK IMMEDIATE MEDICAL CARE IF:   You cannot put weight on the affected hip.  You have fallen.  You have a sudden increase in pain  and swelling in your hip.  You have a fever. MAKE SURE YOU:   Understand these instructions.  Will watch your condition.  Will get help right away if you are not doing well or get worse. Document Released: 12/16/2009 Document Revised: 09/20/2011 Document Reviewed: 12/16/2009 [facility] Patient Information 2013 [place], [place].

## 2012-05-03 ENCOUNTER — Ambulatory Visit (HOSPITAL_BASED_OUTPATIENT_CLINIC_OR_DEPARTMENT_OTHER)
Admission: RE | Admit: 2012-05-03 | Discharge: 2012-05-03 | Disposition: A | Discharge status: Home / Self Care | Payer: PRIVATE HEALTH INSURANCE | Source: Ambulatory Visit | Attending: Family Medicine | Admitting: Family Medicine

## 2012-05-03 DIAGNOSIS — I080 Rheumatic disorders of both mitral and aortic valves: Secondary | ICD-10-CM | POA: Insufficient documentation

## 2012-05-03 DIAGNOSIS — M545 Low back pain: Secondary | ICD-10-CM

## 2012-05-03 DIAGNOSIS — R600 Localized edema: Secondary | ICD-10-CM

## 2012-05-03 DIAGNOSIS — M25551 Pain in right hip: Secondary | ICD-10-CM

## 2012-05-03 DIAGNOSIS — M25552 Pain in left hip: Secondary | ICD-10-CM

## 2012-05-03 DIAGNOSIS — I359 Nonrheumatic aortic valve disorder, unspecified: Secondary | ICD-10-CM

## 2012-05-03 DIAGNOSIS — I379 Nonrheumatic pulmonary valve disorder, unspecified: Secondary | ICD-10-CM | POA: Insufficient documentation

## 2012-05-03 DIAGNOSIS — M79662 Pain in left lower leg: Secondary | ICD-10-CM

## 2012-05-03 DIAGNOSIS — I369 Nonrheumatic tricuspid valve disorder, unspecified: Secondary | ICD-10-CM | POA: Insufficient documentation

## 2012-05-03 NOTE — Progress Notes (Signed)
Quick Note:  Patient Informed and voiced understanding ______ 

## 2012-05-03 NOTE — Progress Notes (Signed)
  Echocardiogram 2D Echocardiogram has been performed.  Tameyah Koch, [facility] 05/03/2012, 10:41 AM

## 2012-05-24 ENCOUNTER — Other Ambulatory Visit: Payer: Self-pay | Admitting: Orthopedic Surgery

## 2012-05-24 DIAGNOSIS — M545 Low back pain: Secondary | ICD-10-CM

## 2012-05-28 ENCOUNTER — Other Ambulatory Visit: Payer: PRIVATE HEALTH INSURANCE

## 2012-05-31 ENCOUNTER — Ambulatory Visit
Admission: RE | Admit: 2012-05-31 | Discharge: 2012-05-31 | Disposition: A | Discharge status: Home / Self Care | Payer: PRIVATE HEALTH INSURANCE | Source: Ambulatory Visit | Attending: Orthopedic Surgery | Admitting: Orthopedic Surgery

## 2012-05-31 DIAGNOSIS — M545 Low back pain: Secondary | ICD-10-CM

## 2012-06-16 ENCOUNTER — Ambulatory Visit (INDEPENDENT_AMBULATORY_CARE_PROVIDER_SITE_OTHER): Payer: PRIVATE HEALTH INSURANCE | Admitting: Family Medicine

## 2012-06-16 ENCOUNTER — Encounter: Payer: Self-pay | Admitting: Family Medicine

## 2012-06-16 VITALS — BP 105/72 | HR 84 | Temp 97.6°F | Ht 65.0 in | Wt 195.8 lb

## 2012-06-16 DIAGNOSIS — J029 Acute pharyngitis, unspecified: Secondary | ICD-10-CM

## 2012-06-16 DIAGNOSIS — K1120 Sialoadenitis, unspecified: Secondary | ICD-10-CM

## 2012-06-16 DIAGNOSIS — I889 Nonspecific lymphadenitis, unspecified: Secondary | ICD-10-CM

## 2012-06-16 DIAGNOSIS — R110 Nausea: Secondary | ICD-10-CM

## 2012-06-16 DIAGNOSIS — K1121 Acute sialoadenitis: Secondary | ICD-10-CM | POA: Insufficient documentation

## 2012-06-16 HISTORY — DX: Nonspecific lymphadenitis, unspecified: I88.9

## 2012-06-16 LAB — POCT RAPID STREP A (OFFICE): Rapid Strep A Screen: NEGATIVE

## 2012-06-16 LAB — CBC
HCT: 40.9 % (ref 36.0–46.0)
Hemoglobin: 13.4 g/dL (ref 12.0–15.0)
MCHC: 32.7 g/dL (ref 30.0–36.0)
MCV: 91 fl (ref 78.0–100.0)
Platelets: 240 10*3/uL (ref 150.0–400.0)
RDW: 13.1 % (ref 11.5–14.6)

## 2012-06-16 MED ORDER — PROMETHAZINE HCL 25 MG PO TABS: 25.0000 mg | ORAL_TABLET | Freq: Three times a day (TID) | ORAL | Status: DC | PRN

## 2012-06-16 MED ORDER — CLINDAMYCIN HCL 300 MG PO CAPS: 300.0000 mg | ORAL_CAPSULE | Freq: Three times a day (TID) | ORAL | Status: DC

## 2012-06-16 NOTE — Assessment & Plan Note (Signed)
Left neck will treat with Clindamycin 300 mg tid

## 2012-06-16 NOTE — Assessment & Plan Note (Signed)
Check mumps titer and cbc, encouraged ice, Ibuprofen and rest. Return early next week for further evaluation

## 2012-06-16 NOTE — Patient Instructions (Addendum)
Parotitis  Parotitis is soreness and swelling (inflammation) of one or both parotid glands. The parotid glands produce saliva. They are located on each side of the face, below and in front of the earlobes. The saliva produced comes out of tiny openings (ducts) inside the cheeks. In most cases, parotitis goes away over time or with treatment. If your parotitis is caused by certain long-term (chronic) diseases, it may come back again.   CAUSES   Parotitis can be caused by:   Viral infections. Mumps is one viral infection that can cause parotitis.   Bacterial infections.   Blockage of the salivary ducts due to a salivary stone.   Narrowing of the salivary ducts.   Swelling of the salivary ducts.   Dehydration.   Autoimmune conditions, such as sarcoidosis or Sjogren's syndrome.   Air from activities such as scuba diving, glass blowing, or playing an instrument (rare).   Human immunodeficiency virus (HIV) or acquired immunodeficiency syndrome (AIDS).   Tuberculosis.  SYMPTOMS    The ears may appear to be pushed up and out from their normal position.   Redness (erythema) of the skin over the parotid glands.   Pain and tenderness over the parotid glands.   Swelling in the parotid gland area.   Yellowish-white fluid (pus) coming from the ducts inside the cheeks.   Dry mouth.   Bad taste in the mouth.  DIAGNOSIS   Your caregiver may determine that you have parotitis based on your symptoms and a physical exam. A sample of fluid may also be taken from the parotid gland and tested to find the cause of your infection. X-rays or computed tomography (CT) scans may be taken if your caregiver thinks you might have a salivary stone blocking your salivary duct.  TREATMENT   Treatment varies depending upon the cause of your parotitis. If your parotitis is caused by mumps, no treatment is needed. The condition will go away on its own after 7 to 10 days. In other cases, treatment may include:   Antibiotics if your  infection was caused by bacteria.   Pain medicines.   Gland massage.   Eating sour candy to increase your saliva production.   Removal of salivary stones. Your caregiver may flush stones out with fluids or remove them with tweezers.   Surgery to remove the parotid glands.  HOME CARE INSTRUCTIONS    If you were given antibiotics, take them as directed. Finish them even if you start to feel better.   Put warm compresses on the sore area.   Only take over-the-counter or prescription medicines for pain, discomfort, or fever as directed by your caregiver.   Drink enough fluids to keep your urine clear or pale yellow.  SEEK IMMEDIATE MEDICAL CARE IF:    You have increasing pain or swelling that is not controlled with medicine.   You have a fever.  MAKE SURE YOU:   Understand these instructions.   Will watch your condition.   Will get help right away if you are not doing well or get worse.  Document Released: 12/18/2001 Document Revised: 09/20/2011 Document Reviewed: 05/24/2011  [employer] Patient Information 2013 [employer].

## 2012-06-16 NOTE — Progress Notes (Signed)
Patient ID: [name], female   DOB: 1956-01-28, 56 y.o.   MRN: [id number] [name] [id number] 1956/07/10 06/16/2012      Progress Note-Follow Up  Subjective  Chief Complaint  Chief Complaint  Patient presents with  . Sore Throat    X  2 weeks- and lump near left ear and neck X 3 days    HPI  Patient is a 56 year old Caucasian female who is in today complaining of swelling and pain around her left ear for 2-3 days now. She reports mouth or throat 2 weeks ago as well as migraine week ago. Then over the last few days she's had increased nasal congestion itchy watery eyes and nose. Nausea then had low-grade fevers and chills and now she has swelling that is uncomfortable in front of and behind her left ear. She also has pain and discomfort down the left side of her neck. No chest congestion no right ear pain no headache. No neurologic complaints, shortness of breath palpitations or other GI symptoms noted. She has not tried any new medications for her symptoms  Past Medical History  Diagnosis Date  . Allergy     seasonal  . Anxiety   . Depression   . Hyperlipidemia   . History of proteinuria syndrome   . Edema extremities   . RLS (restless legs syndrome)   . Dyspareunia   . Obese   . Onychomycosis 05/09/2011  . Low back pain 05/09/2011  . RUQ pain 09/20/2011  . Hip pain, left 11/01/2011  . Lymphadenitis 06/16/2012    Past Surgical History  Procedure Date  . Cesarean section 1993  . Back surgery 2006    low, discectomy for herniated disc    Family History  Problem Relation Age of Onset  . Cancer Mother 87    thyroid  . Arthritis Mother     nreck, back  . Hepatitis Sister     Hepatitis C  . Diabetes Maternal Grandmother     History   Social History  . Marital Status: Married    Spouse Name: N/A    Number of Children: N/A  . Years of Education: N/A   Occupational History  . Not on file.   Social History Main Topics  . Smoking status: Former Smoker  -- 0.5 packs/day for 20 years    Types: Cigarettes    Quit date: 07/12/1992  . Smokeless tobacco: Never Used  . Alcohol Use: Yes     Comment: occasionaly  . Drug Use: No  . Sexually Active: Yes -- Female partner(s)   Other Topics Concern  . Not on file   Social History Narrative  . No narrative on file    Current Outpatient Prescriptions on File Prior to Visit  Medication Sig Dispense Refill  . ALPRAZolam (XANAX) 0.5 MG tablet Take 1 tablet (0.5 mg total) by mouth at bedtime as needed for sleep or anxiety.  30 tablet  5  . Calcium Carbonate-Vitamin D (CALTRATE 600+D) 600-400 MG-UNIT per tablet Take 1 tablet by mouth daily.       . Cholecalciferol (VITAMIN D3) 1000 UNITS CAPS Take by mouth.        Marland Kitchen FLAXSEED, LINSEED, PO Take by mouth every other day.       . furosemide (LASIX) 40 MG tablet 1 tab po daily and a 2 nd tab daily prn increased edema  60 tablet  5  . lamoTRIgine (LAMICTAL) 200 MG tablet Take 200 mg by mouth daily.        Marland Kitchen  omeprazole (PRILOSEC) 40 MG capsule Take 1 capsule (40 mg total) by mouth daily.  30 capsule  5  . potassium chloride SA (K-DUR,KLOR-CON) 20 MEQ tablet 1 tab po daily and a 2nd prn increased Lasix use  60 tablet  5  . progesterone (PROMETRIUM) 200 MG capsule Take 200 mg by mouth as directed.        Marland Kitchen QUEtiapine (SEROQUEL) 100 MG tablet Take 100 mg by mouth at bedtime.        Marland Kitchen rOPINIRole (REQUIP) 1 MG tablet Take 1 mg by mouth daily.        . simvastatin (ZOCOR) 20 MG tablet Take 1 tablet (20 mg total) by mouth at bedtime.  30 tablet  11  . traMADol (ULTRAM) 50 MG tablet Take 1 tablet (50 mg total) by mouth every 8 (eight) hours as needed for pain.  60 tablet  3  . venlafaxine XR (EFFEXOR-XR) 150 MG 24 hr capsule Take 150 mg by mouth daily.      . Zinc 50 MG TABS Take 1 tablet by mouth daily.      . promethazine (PHENERGAN) 25 MG tablet Take 1 tablet (25 mg total) by mouth every 8 (eight) hours as needed for nausea.  20 tablet  0    Allergies  Allergen  Reactions  . Ceclor (Cefaclor)     Tongue swell    Review of Systems  Review of Systems  Constitutional: Positive for fever and malaise/fatigue.  HENT: Positive for congestion and sore throat.        Swelling and tenderness in front of and behind left ear. Pain and swelling also noted down left side of nec.  Eyes: Negative for discharge.  Respiratory: Positive for cough. Negative for shortness of breath.   Cardiovascular: Negative for chest pain, palpitations and leg swelling.  Gastrointestinal: Positive for nausea. Negative for vomiting, abdominal pain and diarrhea.  Genitourinary: Negative for dysuria.  Musculoskeletal: Negative for falls.  Skin: Negative for rash.  Neurological: Negative for loss of consciousness and headaches.  Endo/Heme/Allergies: Negative for polydipsia.  Psychiatric/Behavioral: Negative for depression and suicidal ideas. The patient is not nervous/anxious and does not have insomnia.     Objective  BP 105/72  Pulse 84  Temp 97.6 F (36.4 C) (Temporal)  Ht 5\' 5"  (1.651 m)  Wt 195 lb 12.8 oz (88.814 kg)  BMI 32.58 kg/m2  SpO2 98%  Physical Exam  Physical Exam  Constitutional: She is oriented to person, place, and time and well-developed, well-nourished, and in no distress. No distress.  HENT:  Head: Normocephalic and atraumatic.       Parotid gland swollen, mildy erythematous and tender on left  Eyes: Conjunctivae normal are normal.  Neck: Neck supple. No thyromegaly present.       Left posterior auricular node and nodes along left SCM all swollen and tender  Cardiovascular: Normal rate, regular rhythm and normal heart sounds.   No murmur heard. Pulmonary/Chest: Effort normal and breath sounds normal. She has no wheezes.  Abdominal: She exhibits no distension and no mass.  Musculoskeletal: She exhibits no edema.  Lymphadenopathy:    She has no cervical adenopathy.  Neurological: She is alert and oriented to person, place, and time.  Skin: Skin  is warm and dry. No rash noted. She is not diaphoretic.  Psychiatric: Memory, affect and judgment normal.    Lab Results  Component Value Date   TSH 1.202 11/01/2011   Lab Results  Component Value Date   WBC  6.9 09/22/2011   HGB 13.3 09/22/2011   HCT 41.2 09/22/2011   MCV 90.5 09/22/2011   PLT 252 09/22/2011   Lab Results  Component Value Date   CREATININE 0.75 11/01/2011   BUN 14 11/01/2011   NA 139 11/01/2011   K 4.0 11/01/2011   CL 101 11/01/2011   CO2 29 11/01/2011   Lab Results  Component Value Date   ALT 22 09/22/2011   AST 26 09/22/2011   ALKPHOS 47 09/22/2011   BILITOT 0.5 09/22/2011   Lab Results  Component Value Date   CHOL 169 04/27/2012   Lab Results  Component Value Date   HDL 43.20 04/27/2012   Lab Results  Component Value Date   LDLCALC 100* 11/01/2011   Lab Results  Component Value Date   TRIG 308.0* 04/27/2012   Lab Results  Component Value Date   CHOLHDL 4 04/27/2012     Assessment & Plan  Parotitis, acute Check mumps titer and cbc, encouraged ice, Ibuprofen and rest. Return early next week for further evaluation  Lymphadenitis Left neck will treat with Clindamycin 300 mg tid

## 2012-06-18 LAB — CULTURE, GROUP A STREP: Organism ID, Bacteria: NORMAL

## 2012-06-20 NOTE — Progress Notes (Signed)
Quick Note:  Patient Informed and voiced understanding ______ 

## 2012-06-22 NOTE — Progress Notes (Signed)
Quick Note:  Patient Informed and voiced understanding ______ 

## 2012-06-23 ENCOUNTER — Ambulatory Visit (INDEPENDENT_AMBULATORY_CARE_PROVIDER_SITE_OTHER): Payer: PRIVATE HEALTH INSURANCE | Admitting: Family Medicine

## 2012-06-23 ENCOUNTER — Encounter: Payer: Self-pay | Admitting: Family Medicine

## 2012-06-23 VITALS — BP 109/72 | HR 83 | Temp 97.6°F | Ht 65.0 in | Wt 197.8 lb

## 2012-06-23 DIAGNOSIS — K1121 Acute sialoadenitis: Secondary | ICD-10-CM

## 2012-06-23 DIAGNOSIS — H60399 Other infective otitis externa, unspecified ear: Secondary | ICD-10-CM

## 2012-06-23 DIAGNOSIS — H6090 Unspecified otitis externa, unspecified ear: Secondary | ICD-10-CM

## 2012-06-23 DIAGNOSIS — K1120 Sialoadenitis, unspecified: Secondary | ICD-10-CM

## 2012-06-23 HISTORY — DX: Unspecified otitis externa, unspecified ear: H60.90

## 2012-06-23 MED ORDER — NEOMYCIN-POLYMYXIN-HC 1 % OT SOLN: 3.0000 [drp] | Freq: Three times a day (TID) | OTIC | Status: DC

## 2012-06-23 NOTE — Progress Notes (Signed)
Patient ID: [name], female   DOB: 05-15-1956, 56 y.o.   MRN: [id number] [name] [id number] September 26, 1955 06/23/2012      Progress Note-Follow Up  Subjective  Chief Complaint  Chief Complaint  Patient presents with  . Follow-up    1 week follow up    HPI  Patient is a 56 year old Caucasian female who is in today for followup secondary to parotiditis. Last week she had significant painful swelling on her left.. We started on antibiotics and her symptoms started to improve almost immediately. At this point she denies any pain, discomfort, swelling, malaise, myalgias, fevers or any other concerns. The other complaint is some itching in her left ear canal. That has been present for several days without any pain or discharge. No change in hearing, chest pain palpitations, shortness of breath, GI or GU complaints otherwise noted she is awaiting epidural injections in her spine next week and has stopped all NSAIDs and is encouraged to stop all fatty acid supplements until that procedure is completed  Past Medical History  Diagnosis Date  . Allergy     seasonal  . Anxiety   . Depression   . Hyperlipidemia   . History of proteinuria syndrome   . Edema extremities   . RLS (restless legs syndrome)   . Dyspareunia   . Obese   . Onychomycosis 05/09/2011  . Low back pain 05/09/2011  . RUQ pain 09/20/2011  . Hip pain, left 11/01/2011  . Lymphadenitis 06/16/2012  . Otitis externa 06/23/2012    left    Past Surgical History  Procedure Date  . Cesarean section 1993  . Back surgery 2006    low, discectomy for herniated disc    Family History  Problem Relation Age of Onset  . Cancer Mother 73    thyroid  . Arthritis Mother     nreck, back  . Hepatitis Sister     Hepatitis C  . Diabetes Maternal Grandmother     History   Social History  . Marital Status: Married    Spouse Name: N/A    Number of Children: N/A  . Years of Education: N/A   Occupational History  .  Not on file.   Social History Main Topics  . Smoking status: Former Smoker -- 0.5 packs/day for 20 years    Types: Cigarettes    Quit date: 07/12/1992  . Smokeless tobacco: Never Used  . Alcohol Use: Yes     Comment: occasionaly  . Drug Use: No  . Sexually Active: Yes -- Female partner(s)   Other Topics Concern  . Not on file   Social History Narrative  . No narrative on file    Current Outpatient Prescriptions on File Prior to Visit  Medication Sig Dispense Refill  . ALPRAZolam (XANAX) 0.5 MG tablet Take 1 tablet (0.5 mg total) by mouth at bedtime as needed for sleep or anxiety.  30 tablet  5  . Calcium Carbonate-Vitamin D (CALTRATE 600+D) 600-400 MG-UNIT per tablet Take 1 tablet by mouth daily.       . Cholecalciferol (VITAMIN D3) 1000 UNITS CAPS Take by mouth.        . clindamycin (CLEOCIN) 300 MG capsule Take 1 capsule (300 mg total) by mouth 3 (three) times daily.  30 capsule  0  . FLAXSEED, LINSEED, PO Take by mouth every other day.       . furosemide (LASIX) 40 MG tablet 1 tab po daily and a 2 nd tab  daily prn increased edema  60 tablet  5  . lamoTRIgine (LAMICTAL) 200 MG tablet Take 200 mg by mouth daily.        Marland Kitchen omeprazole (PRILOSEC) 40 MG capsule Take 1 capsule (40 mg total) by mouth daily.  30 capsule  5  . potassium chloride SA (K-DUR,KLOR-CON) 20 MEQ tablet 1 tab po daily and a 2nd prn increased Lasix use  60 tablet  5  . progesterone (PROMETRIUM) 200 MG capsule Take 200 mg by mouth as directed.        . promethazine (PHENERGAN) 25 MG tablet Take 1 tablet (25 mg total) by mouth every 8 (eight) hours as needed for nausea.  20 tablet  0  . QUEtiapine (SEROQUEL) 100 MG tablet Take 100 mg by mouth at bedtime.        Marland Kitchen rOPINIRole (REQUIP) 1 MG tablet Take 1 mg by mouth daily.        . simvastatin (ZOCOR) 20 MG tablet Take 1 tablet (20 mg total) by mouth at bedtime.  30 tablet  11  . traMADol (ULTRAM) 50 MG tablet Take 1 tablet (50 mg total) by mouth every 8 (eight) hours as  needed for pain.  60 tablet  3  . venlafaxine XR (EFFEXOR-XR) 150 MG 24 hr capsule Take 150 mg by mouth daily.      . Zinc 50 MG TABS Take 1 tablet by mouth daily.        Allergies  Allergen Reactions  . Ceclor (Cefaclor)     Tongue swell    Review of Systems  Review of Systems  Constitutional: Negative for fever and malaise/fatigue.  HENT: Negative for congestion.   Eyes: Negative for discharge.  Respiratory: Negative for shortness of breath.   Cardiovascular: Negative for chest pain, palpitations and leg swelling.  Gastrointestinal: Negative for nausea, abdominal pain and diarrhea.  Genitourinary: Negative for dysuria.  Musculoskeletal: Negative for falls.  Skin: Negative for rash.  Neurological: Negative for loss of consciousness and headaches.  Endo/Heme/Allergies: Negative for polydipsia.  Psychiatric/Behavioral: Negative for depression and suicidal ideas. The patient is not nervous/anxious and does not have insomnia.     Objective  BP 109/72  Pulse 83  Temp 97.6 F (36.4 C) (Temporal)  Ht 5\' 5"  (1.651 m)  Wt 197 lb 12.8 oz (89.721 kg)  BMI 32.92 kg/m2  SpO2 98%  Physical Exam  Physical Exam  Constitutional: She is oriented to person, place, and time and well-developed, well-nourished, and in no distress. No distress.  HENT:  Head: Normocephalic and atraumatic.       Left external canal, dry flaky skin  Eyes: Conjunctivae normal are normal.  Neck: Neck supple. No thyromegaly present.  Cardiovascular: Normal rate, regular rhythm and normal heart sounds.   No murmur heard. Pulmonary/Chest: Effort normal and breath sounds normal. She has no wheezes.  Abdominal: She exhibits no distension and no mass.  Musculoskeletal: She exhibits no edema.  Lymphadenopathy:    She has no cervical adenopathy.  Neurological: She is alert and oriented to person, place, and time.  Skin: Skin is warm and dry. No rash noted. She is not diaphoretic.  Psychiatric: Memory, affect  and judgment normal.    Lab Results  Component Value Date   TSH 1.202 11/01/2011   Lab Results  Component Value Date   WBC 6.9 06/16/2012   HGB 13.4 06/16/2012   HCT 40.9 06/16/2012   MCV 91.0 06/16/2012   PLT 240.0 06/16/2012   Lab Results  Component Value Date   CREATININE 0.75 11/01/2011   BUN 14 11/01/2011   NA 139 11/01/2011   K 4.0 11/01/2011   CL 101 11/01/2011   CO2 29 11/01/2011   Lab Results  Component Value Date   ALT 22 09/22/2011   AST 26 09/22/2011   ALKPHOS 47 09/22/2011   BILITOT 0.5 09/22/2011   Lab Results  Component Value Date   CHOL 169 04/27/2012   Lab Results  Component Value Date   HDL 43.20 04/27/2012   Lab Results  Component Value Date   LDLCALC 100* 11/01/2011   Lab Results  Component Value Date   TRIG 308.0* 04/27/2012   Lab Results  Component Value Date   CHOLHDL 4 04/27/2012     Assessment & Plan  Parotitis, acute Resolved with antibiotics, finish course and continue probiotic  Otitis externa coritsporin HC otic prn and call if any concerns

## 2012-06-23 NOTE — Patient Instructions (Addendum)
Otitis Externa Otitis externa is a bacterial or fungal infection of the outer ear canal. This is the area from the eardrum to the outside of the ear. Otitis externa is sometimes called "swimmer's ear." CAUSES  Possible causes of infection include:  Swimming in dirty water.  Moisture remaining in the ear after swimming or bathing.  Mild injury (trauma) to the ear.  Objects stuck in the ear (foreign body).  Cuts or scrapes (abrasions) on the outside of the ear. SYMPTOMS  The first symptom of infection is often itching in the ear canal. Later signs and symptoms may include swelling and redness of the ear canal, ear pain, and yellowish-white fluid (pus) coming from the ear. The ear pain may be worse when pulling on the earlobe. DIAGNOSIS  Your caregiver will perform a physical exam. A sample of fluid may be taken from the ear and examined for bacteria or fungi. TREATMENT  Antibiotic ear drops are often given for 10 to 14 days. Treatment may also include pain medicine or corticosteroids to reduce itching and swelling. PREVENTION   Keep your ear dry. Use the corner of a towel to absorb water out of the ear canal after swimming or bathing.  Avoid scratching or putting objects inside your ear. This can damage the ear canal or remove the protective wax that lines the canal. This makes it easier for bacteria and fungi to grow.  Avoid swimming in lakes, polluted water, or poorly chlorinated pools.  You may use ear drops made of rubbing alcohol and vinegar after swimming. Combine equal parts of white vinegar and alcohol in a bottle. Put 3 or 4 drops into each ear after swimming. HOME CARE INSTRUCTIONS   Apply antibiotic ear drops to the ear canal as prescribed by your caregiver.  Only take over-the-counter or prescription medicines for pain, discomfort, or fever as directed by your caregiver.  If you have diabetes, follow any additional treatment instructions from your caregiver.  Keep all  follow-up appointments as directed by your caregiver. SEEK MEDICAL CARE IF:   You have a fever.  Your ear is still red, swollen, painful, or draining pus after 3 days.  Your redness, swelling, or pain gets worse.  You have a severe headache.  You have redness, swelling, pain, or tenderness in the area behind your ear. MAKE SURE YOU:   Understand these instructions.  Will watch your condition.  Will get help right away if you are not doing well or get worse. Document Released: 06/28/2005 Document Revised: 09/20/2011 Document Reviewed: 07/15/2011 [employer] Patient Information 2013 [employer].  

## 2012-06-23 NOTE — Assessment & Plan Note (Signed)
coritsporin HC otic prn and call if any concerns

## 2012-06-23 NOTE — Assessment & Plan Note (Signed)
Resolved with antibiotics, finish course and continue probiotic

## 2012-06-25 LAB — ECHOVIRUS ABS PANEL (CSF)
Echovirus Ab Type 30: <1:10 {titer}
Echovirus Ab Type 7: <1:10 {titer}

## 2012-06-27 ENCOUNTER — Other Ambulatory Visit: Payer: Self-pay | Admitting: Family Medicine

## 2012-06-27 ENCOUNTER — Other Ambulatory Visit: Payer: Self-pay | Admitting: Obstetrics & Gynecology

## 2012-06-27 DIAGNOSIS — Z1231 Encounter for screening mammogram for malignant neoplasm of breast: Secondary | ICD-10-CM

## 2012-08-09 ENCOUNTER — Ambulatory Visit: Payer: PRIVATE HEALTH INSURANCE

## 2012-08-10 ENCOUNTER — Ambulatory Visit
Admission: RE | Admit: 2012-08-10 | Discharge: 2012-08-10 | Disposition: A | Discharge status: Home / Self Care | Payer: BC Managed Care – PPO | Source: Ambulatory Visit | Attending: Obstetrics & Gynecology | Admitting: Obstetrics & Gynecology

## 2012-08-10 DIAGNOSIS — Z1231 Encounter for screening mammogram for malignant neoplasm of breast: Secondary | ICD-10-CM

## 2012-09-14 ENCOUNTER — Telehealth: Payer: Self-pay | Admitting: Family Medicine

## 2012-09-14 ENCOUNTER — Other Ambulatory Visit: Payer: Self-pay | Admitting: Family Medicine

## 2012-09-14 NOTE — Telephone Encounter (Signed)
Please advise refill? 

## 2012-09-14 NOTE — Telephone Encounter (Signed)
Refill- alprazolam 0.5mg  tablet. Take one tablet by mouth at bedtime as needed for sleep or anxiety. Qty 30 last fill 8.12.13

## 2012-09-14 NOTE — Telephone Encounter (Signed)
Ok to refill Alprazolam #[phone] rf

## 2012-09-18 MED ORDER — ALPRAZOLAM 0.5 MG PO TABS: 0.5000 mg | ORAL_TABLET | Freq: Every evening | ORAL | Status: DC | PRN

## 2012-10-18 ENCOUNTER — Other Ambulatory Visit: Payer: Self-pay | Admitting: Urology

## 2012-10-19 ENCOUNTER — Telehealth: Payer: Self-pay | Admitting: Emergency Medicine

## 2012-10-19 NOTE — Telephone Encounter (Signed)
Wants referral for pain management. She is seeing one now but is not satisfied and wants another referral.

## 2012-10-20 NOTE — Telephone Encounter (Signed)
So I still need to know is it mostly for back pain or leg pain or both? Will set up referral after that

## 2012-10-20 NOTE — Telephone Encounter (Signed)
OK to move referral for pain management but I need to know a bit more. First she needs to come signa a release of rec for current pain management office, new office will usually not accept without old records. Then I need her to clarify is it primarily the low back pain they manage or her leg pain or both?

## 2012-10-20 NOTE — Telephone Encounter (Signed)
Please advise 

## 2012-10-20 NOTE — Telephone Encounter (Signed)
Patient states she would like to go to Dr Doristine Section? On [address]? Pt states Dr Sheppard Coil office sent her to Dr Christianne Dolin office and its to crowded in the waiting room, she has to wait 3-4 hours, theres only one bathroom for men or women, and they did a drug test again in 30 days. Pt states she will come and sign a release and would like a referral to be placed to Dr Roxy Horseman on [address]? Please advise?

## 2012-10-23 ENCOUNTER — Other Ambulatory Visit: Payer: Self-pay | Admitting: Family Medicine

## 2012-10-23 DIAGNOSIS — M549 Dorsalgia, unspecified: Secondary | ICD-10-CM

## 2012-10-23 NOTE — Telephone Encounter (Signed)
done

## 2012-10-23 NOTE — Telephone Encounter (Signed)
Sorry she said for back pain.

## 2012-11-03 ENCOUNTER — Encounter (HOSPITAL_BASED_OUTPATIENT_CLINIC_OR_DEPARTMENT_OTHER): Payer: Self-pay | Admitting: *Deleted

## 2012-11-03 NOTE — Progress Notes (Signed)
To [facility] at 0615-Hg on arrival,Npo after Mn-instructed to take omeprazole,xanax,effexor with small amt water that am.

## 2012-11-05 NOTE — Anesthesia Preprocedure Evaluation (Addendum)
Anesthesia Evaluation  Patient identified by MRN, date of birth, ID band Patient awake    Reviewed: Allergy & Precautions, H&P , NPO status , Patient's Chart, lab work & pertinent test results, reviewed documented beta blocker date and time   Airway Mallampati: II TM Distance: >3 FB Neck ROM: full    Dental  (+) Loose and Dental Advisory Given She reports slightly loose upper incisors.:   Pulmonary former smoker,  breath sounds clear to auscultation  Pulmonary exam normal       Cardiovascular Exercise Tolerance: Good negative cardio ROS  Rhythm:regular Rate:Normal  ECHO from Oct 2013 reviewed. EF good. Mild to moderate Aortic regurg. Echo was done because of swelling left ankle. She denies cardiopulmonary symptoms.   Neuro/Psych PSYCHIATRIC DISORDERS Anxiety Depression Low back pain.    GI/Hepatic Neg liver ROS, GERD-  Medicated,  Endo/Other  negative endocrine ROS  Renal/GU Renal diseaseH/O proteinuria.  negative genitourinary   Musculoskeletal   Abdominal (+) + obese,   Peds  Hematology  (+) Blood dyscrasia, ,   Anesthesia Other Findings   Reproductive/Obstetrics negative OB ROS                       Anesthesia Physical Anesthesia Plan  ASA: II  Anesthesia Plan: General   Post-op Pain Management:    Induction: Intravenous  Airway Management Planned: LMA  Additional Equipment:   Intra-op Plan:   Post-operative Plan: Extubation in OR  Informed Consent: I have reviewed the patients History and Physical, chart, labs and discussed the procedure including the risks, benefits and alternatives for the proposed anesthesia with the patient or authorized representative who has indicated his/her understanding and acceptance.   Dental Advisory Given  Plan Discussed with: CRNA  Anesthesia Plan Comments:         Anesthesia Quick Evaluation

## 2012-11-06 NOTE — H&P (Signed)
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 Dyspareunia 625.0 3. 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 Lower Back Pain 724.2 5. Pyuria 791.9 6. Urge And Stress Incontinence 788.33 7. Vaginitis 616.10  History of Present Illness  [name] returns today in f/u from her UDS.  She was found to have low amplitude detrusor instability, a low VLPP and CLPP consistent with genuine stress incontinence.  She had some EDSD consistent with a history of dysfunctional voiding, but she emptied completely with a good stream and low pressure voiding.   Past Medical History Problems  1. History of  Anxiety (Symptom) 300.00 2. History of  Depression 311 3. History of  Heartburn 787.1 4. History of  Hypercholesterolemia 272.0 5. History of  Incomplete Emptying Of Bladder 788.21  Surgical History Problems  1. History of  Back Surgery 2. History of  Cesarean Section 3. History of  Wrist Carpectomy  Current Meds 1. Biotin TABS; Therapy: (Recorded:17Feb2014) to 2. Caltrate 600+D TABS; Therapy: (Recorded:17Feb2014) to 3. Estrace 0.1 MG/GM Vaginal Cream; Therapy: (Recorded:17Feb2014) to 4. Estradiol 2 MG Oral Tablet; Therapy: (Recorded:17Feb2014) to 5. Flax Seed Oil CAPS; Therapy: (Recorded:17Feb2014) to 6. Furosemide 40 MG Oral Tablet; Therapy: (Recorded:17Feb2014) to 7. LaMICtal TABS; Therapy: (Recorded:17Feb2014) to 8. OxyCODONE HCl 5 MG Oral Tablet; Therapy: (Recorded:09Apr2014) to 9. Potassium Chloride Crys ER 20 MEQ Oral Tablet Extended Release; Therapy:  (Recorded:17Feb2014) to 10. Prometrium 200 MG Oral Capsule; Therapy: (Recorded:17Feb2014) to 11. ROPINIRole HCl 2 MG Oral Tablet; Therapy: (Recorded:17Feb2014) to 12. SEROquel 100 MG Oral Tablet; Therapy: (Recorded:17Feb2014) to 13. Simvastatin 20 MG Oral Tablet; Therapy: (Recorded:17Feb2014) to 14. Venlafaxine HCl ER 150 MG Oral Tablet Extended Release 24 Hour; Therapy:   (Recorded:17Feb2014) to 15. Vitamin D3 CAPS;  Therapy: (Recorded:17Feb2014) to  Allergies Medication  1. Ceclor CAPS  Family History Problems  1. Maternal history of  Family Health Status Number Of Children 1 son 2. Family history of  Family Health Status Of Father - Alive 3. Family history of  Family Health Status Of Mother - Alive 4. Maternal history of  Thyroid Cancer  Social History Problems  1. Alcohol Use 2 glasses of wine per week 2. Caffeine Use coffee, tea 2/day 3. Former Smoker V15.82 smoked 1ppd for 11yrs quit 50yrs ago 4. 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    Vitals Vital Signs [Data Includes: Last 1 Day]  09Apr2014 09:48AM  Blood Pressure: 108 / 73 Temperature: 97.8 F Heart Rate: 83  Physical Exam Constitutional: Well nourished and well developed . No acute distress.  Pulmonary: No respiratory distress and normal respiratory rhythm and effort.  Cardiovascular: Heart rate and rhythm are normal Amended By: [name]; 10/18/2012 10:22 AMEST . No peripheral edema. Amended By: [name]; 10/18/2012 10:22 AMEST.    Results/Data Urine [Data Includes: Last 1 Day]   09Apr2014  COLOR YELLOW   APPEARANCE CLEAR   SPECIFIC GRAVITY 1.020   pH 5.5   GLUCOSE NEG mg/dL  BILIRUBIN NEG   KETONE NEG mg/dL  BLOOD TRACE   PROTEIN NEG mg/dL  UROBILINOGEN 0.2 mg/dL  NITRITE NEG   LEUKOCYTE ESTERASE NEG   SQUAMOUS EPITHELIAL/HPF FEW   WBC 0-2 WBC/hpf  RBC 0-2 RBC/hpf  BACTERIA RARE   CRYSTALS NONE SEEN   CASTS NONE SEEN    The following images/tracing/specimen were independently visualized:  I have reviewed her UDS tracing, flouro and report.    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 Incomplete Emptying Of Bladder 788.21 3. 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 She has some findings consistent  with dysfunctional voiding. She has a good flow and empties well.   Plan Health Maintenance (V70.0)  1. UA With REFLEX  Done: 09Apr2014 09:38AM Intrinsic Sphincter Deficiency (599.82)  2. Follow-up Schedule Surgery Office  Follow-up  Requested for: 09Apr2014   I discussed physical therapy and a sling procedure but believe her best option is the sling. I reviewed the risks of bleeding, infection, persistent SUI, denovo UUI, obstruction possibly requiring catheterization or repair release, mesh erosion and extrusion, chronic pain, vaginal scarring, dysparunia, injury to the bladder or adjacent structures, thrombotic events and anesthetic complications.  I reviewed the outcome expectations and the recovery time.  She wants to go ahead and schedule the sling.   Discussion/Summary   CC: Dr. Malachi Carl.

## 2012-11-07 ENCOUNTER — Ambulatory Visit (HOSPITAL_BASED_OUTPATIENT_CLINIC_OR_DEPARTMENT_OTHER): Payer: BC Managed Care – PPO | Admitting: Anesthesiology

## 2012-11-07 ENCOUNTER — Encounter (HOSPITAL_BASED_OUTPATIENT_CLINIC_OR_DEPARTMENT_OTHER)
Admission: RE | Disposition: A | Discharge status: Home / Self Care | Payer: Self-pay | Source: Ambulatory Visit | Attending: Urology

## 2012-11-07 ENCOUNTER — Encounter (HOSPITAL_BASED_OUTPATIENT_CLINIC_OR_DEPARTMENT_OTHER): Payer: Self-pay | Admitting: Anesthesiology

## 2012-11-07 ENCOUNTER — Ambulatory Visit (HOSPITAL_BASED_OUTPATIENT_CLINIC_OR_DEPARTMENT_OTHER)
Admission: RE | Admit: 2012-11-07 | Discharge: 2012-11-07 | Disposition: A | Discharge status: Home / Self Care | Payer: BC Managed Care – PPO | Source: Ambulatory Visit | Attending: Urology | Admitting: Urology

## 2012-11-07 ENCOUNTER — Encounter (HOSPITAL_BASED_OUTPATIENT_CLINIC_OR_DEPARTMENT_OTHER): Payer: Self-pay | Admitting: *Deleted

## 2012-11-07 DIAGNOSIS — Z888 Allergy status to other drugs, medicaments and biological substances status: Secondary | ICD-10-CM | POA: Insufficient documentation

## 2012-11-07 DIAGNOSIS — IMO0002 Reserved for concepts with insufficient information to code with codable children: Secondary | ICD-10-CM | POA: Insufficient documentation

## 2012-11-07 DIAGNOSIS — R3915 Urgency of urination: Secondary | ICD-10-CM | POA: Insufficient documentation

## 2012-11-07 DIAGNOSIS — N393 Stress incontinence (female) (male): Secondary | ICD-10-CM

## 2012-11-07 DIAGNOSIS — F411 Generalized anxiety disorder: Secondary | ICD-10-CM | POA: Insufficient documentation

## 2012-11-07 DIAGNOSIS — N3642 Intrinsic sphincter deficiency (ISD): Secondary | ICD-10-CM | POA: Insufficient documentation

## 2012-11-07 DIAGNOSIS — M545 Low back pain, unspecified: Secondary | ICD-10-CM | POA: Insufficient documentation

## 2012-11-07 DIAGNOSIS — N3946 Mixed incontinence: Secondary | ICD-10-CM | POA: Insufficient documentation

## 2012-11-07 DIAGNOSIS — E669 Obesity, unspecified: Secondary | ICD-10-CM | POA: Insufficient documentation

## 2012-11-07 DIAGNOSIS — I359 Nonrheumatic aortic valve disorder, unspecified: Secondary | ICD-10-CM | POA: Insufficient documentation

## 2012-11-07 DIAGNOSIS — D759 Disease of blood and blood-forming organs, unspecified: Secondary | ICD-10-CM | POA: Insufficient documentation

## 2012-11-07 DIAGNOSIS — R339 Retention of urine, unspecified: Secondary | ICD-10-CM | POA: Insufficient documentation

## 2012-11-07 DIAGNOSIS — F329 Major depressive disorder, single episode, unspecified: Secondary | ICD-10-CM | POA: Insufficient documentation

## 2012-11-07 DIAGNOSIS — F3289 Other specified depressive episodes: Secondary | ICD-10-CM | POA: Insufficient documentation

## 2012-11-07 DIAGNOSIS — E7800 Pure hypercholesterolemia, unspecified: Secondary | ICD-10-CM | POA: Insufficient documentation

## 2012-11-07 DIAGNOSIS — N760 Acute vaginitis: Secondary | ICD-10-CM | POA: Insufficient documentation

## 2012-11-07 DIAGNOSIS — Z808 Family history of malignant neoplasm of other organs or systems: Secondary | ICD-10-CM | POA: Insufficient documentation

## 2012-11-07 DIAGNOSIS — K219 Gastro-esophageal reflux disease without esophagitis: Secondary | ICD-10-CM | POA: Insufficient documentation

## 2012-11-07 DIAGNOSIS — Z79899 Other long term (current) drug therapy: Secondary | ICD-10-CM | POA: Insufficient documentation

## 2012-11-07 DIAGNOSIS — N318 Other neuromuscular dysfunction of bladder: Secondary | ICD-10-CM | POA: Insufficient documentation

## 2012-11-07 DIAGNOSIS — Z87891 Personal history of nicotine dependence: Secondary | ICD-10-CM | POA: Insufficient documentation

## 2012-11-07 HISTORY — PX: BLADDER SUSPENSION: SHX72

## 2012-11-07 HISTORY — DX: Gastro-esophageal reflux disease without esophagitis: K21.9

## 2012-11-07 HISTORY — PX: CYSTOSCOPY: SHX5120

## 2012-11-07 LAB — POCT HEMOGLOBIN-HEMACUE: Hemoglobin: 14 g/dL (ref 12.0–15.0)

## 2012-11-07 SURGERY — CREATION, URETHRAL SLING, RETROPUBIC APPROACH, USING POLYPROPYLENE TAPE
Anesthesia: General | Site: Vagina | Wound class: Clean Contaminated

## 2012-11-07 MED ORDER — DEXAMETHASONE SODIUM PHOSPHATE 4 MG/ML IJ SOLN
INTRAMUSCULAR | Status: DC | PRN
  Administered 2012-11-07: 8 mg via INTRAVENOUS

## 2012-11-07 MED ORDER — DOCUSATE SODIUM 100 MG PO CAPS
100.0000 mg | ORAL_CAPSULE | Freq: Two times a day (BID) | ORAL | Status: DC
Start: 2012-11-07 — End: 2013-07-10

## 2012-11-07 MED ORDER — LACTATED RINGERS IV SOLN
INTRAVENOUS | Status: DC
  Administered 2012-11-07 (×3): via INTRAVENOUS
  Filled 2012-11-07: qty 1000

## 2012-11-07 MED ORDER — CIPROFLOXACIN HCL 500 MG PO TABS: 500.0000 mg | ORAL_TABLET | Freq: Two times a day (BID) | ORAL | Status: DC

## 2012-11-07 MED ORDER — ONDANSETRON HCL 4 MG/2ML IJ SOLN
4.0000 mg | Freq: Four times a day (QID) | INTRAMUSCULAR | Status: DC | PRN
  Filled 2012-11-07: qty 2

## 2012-11-07 MED ORDER — CIPROFLOXACIN IN D5W 400 MG/200ML IV SOLN
400.0000 mg | INTRAVENOUS | Status: AC
  Administered 2012-11-07: 400 mg via INTRAVENOUS
  Filled 2012-11-07: qty 200

## 2012-11-07 MED ORDER — ACETAMINOPHEN 650 MG RE SUPP
650.0000 mg | RECTAL | Status: DC | PRN
  Filled 2012-11-07: qty 1

## 2012-11-07 MED ORDER — PROPOFOL 10 MG/ML IV BOLUS
INTRAVENOUS | Status: DC | PRN
  Administered 2012-11-07: 250 mg via INTRAVENOUS

## 2012-11-07 MED ORDER — SODIUM CHLORIDE 0.9 % IV SOLN
250.0000 mL | INTRAVENOUS | Status: DC | PRN
  Filled 2012-11-07: qty 250

## 2012-11-07 MED ORDER — OXYCODONE HCL 5 MG PO TABS: 5.0000 mg | ORAL_TABLET | ORAL | Status: DC | PRN

## 2012-11-07 MED ORDER — STERILE WATER FOR IRRIGATION IR SOLN
Status: DC | PRN
  Administered 2012-11-07: 3000 mL

## 2012-11-07 MED ORDER — LIDOCAINE-EPINEPHRINE (PF) 1 %-1:200000 IJ SOLN
INTRAMUSCULAR | Status: DC | PRN
  Administered 2012-11-07: 5 mL

## 2012-11-07 MED ORDER — SODIUM CHLORIDE 0.9 % IJ SOLN
3.0000 mL | Freq: Two times a day (BID) | INTRAMUSCULAR | Status: DC
  Filled 2012-11-07: qty 3

## 2012-11-07 MED ORDER — SODIUM CHLORIDE 0.9 % IJ SOLN
3.0000 mL | INTRAMUSCULAR | Status: DC | PRN
  Filled 2012-11-07: qty 3

## 2012-11-07 MED ORDER — PROMETHAZINE HCL 25 MG/ML IJ SOLN
6.2500 mg | INTRAMUSCULAR | Status: DC | PRN
  Filled 2012-11-07: qty 1

## 2012-11-07 MED ORDER — MIDAZOLAM HCL 5 MG/5ML IJ SOLN
INTRAMUSCULAR | Status: DC | PRN
  Administered 2012-11-07: 1 mg via INTRAVENOUS

## 2012-11-07 MED ORDER — ACETAMINOPHEN 325 MG PO TABS
650.0000 mg | ORAL_TABLET | ORAL | Status: DC | PRN
  Filled 2012-11-07: qty 2

## 2012-11-07 MED ORDER — FENTANYL CITRATE 0.05 MG/ML IJ SOLN
INTRAMUSCULAR | Status: DC | PRN
  Administered 2012-11-07 (×6): 25 ug via INTRAVENOUS
  Administered 2012-11-07: 50 ug via INTRAVENOUS

## 2012-11-07 MED ORDER — LIDOCAINE HCL (CARDIAC) 20 MG/ML IV SOLN
INTRAVENOUS | Status: DC | PRN
  Administered 2012-11-07: 7575 mg via INTRAVENOUS

## 2012-11-07 MED ORDER — SODIUM CHLORIDE 0.9 % IR SOLN
Status: DC | PRN
  Administered 2012-11-07: 08:00:00

## 2012-11-07 MED ORDER — FENTANYL CITRATE 0.05 MG/ML IJ SOLN
25.0000 ug | INTRAMUSCULAR | Status: DC | PRN
  Administered 2012-11-07 (×3): 25 ug via INTRAVENOUS
  Filled 2012-11-07: qty 1

## 2012-11-07 MED ORDER — OXYCODONE HCL 5 MG PO TABS
5.0000 mg | ORAL_TABLET | ORAL | Status: DC | PRN
  Administered 2012-11-07: 5 mg via ORAL
  Filled 2012-11-07: qty 2

## 2012-11-07 MED ORDER — ONDANSETRON HCL 4 MG/2ML IJ SOLN
INTRAMUSCULAR | Status: DC | PRN
  Administered 2012-11-07: 4 mg via INTRAVENOUS

## 2012-11-07 MED ORDER — FENTANYL CITRATE 0.05 MG/ML IJ SOLN
25.0000 ug | INTRAMUSCULAR | Status: DC | PRN
  Filled 2012-11-07: qty 1

## 2012-11-07 SURGICAL SUPPLY — 38 items
ADH SKN CLS APL DERMABOND .7 (GAUZE/BANDAGES/DRESSINGS) ×2
BAG URINE DRAINAGE (UROLOGICAL SUPPLIES) ×3 IMPLANT
BLADE SURG 15 STRL LF DISP TIS (BLADE) ×2 IMPLANT
BLADE SURG 15 STRL SS (BLADE) ×3
BLADE SURG ROTATE 9660 (MISCELLANEOUS) ×3 IMPLANT
CANISTER SUCTION 1200CC (MISCELLANEOUS) ×3 IMPLANT
CATH FOLEY 2WAY SLVR  5CC 16FR (CATHETERS) ×1
CATH FOLEY 2WAY SLVR 5CC 16FR (CATHETERS) ×2 IMPLANT
CLEANER CAUTERY TIP 5X5 PAD (MISCELLANEOUS) IMPLANT
CLOTH BEACON ORANGE TIMEOUT ST (SAFETY) ×3 IMPLANT
COVER MAYO STAND STRL (DRAPES) ×3 IMPLANT
DERMABOND ADVANCED (GAUZE/BANDAGES/DRESSINGS) ×1
DERMABOND ADVANCED .7 DNX12 (GAUZE/BANDAGES/DRESSINGS) ×2 IMPLANT
DRAPE LG THREE QUARTER DISP (DRAPES) ×3 IMPLANT
DRAPE UNDERBUTTOCKS STRL (DRAPE) ×3 IMPLANT
ELECT REM PT RETURN 9FT ADLT (ELECTROSURGICAL)
ELECTRODE REM PT RTRN 9FT ADLT (ELECTROSURGICAL) IMPLANT
GAUZE PACKING IODOFORM 2 (PACKING) ×1 IMPLANT
GLOVE BIO SURGEON STRL SZ 6.5 (GLOVE) ×2 IMPLANT
GLOVE BIO SURGEON STRL SZ7.5 (GLOVE) ×1 IMPLANT
GLOVE ECLIPSE 6.0 STRL STRAW (GLOVE) ×1 IMPLANT
GLOVE ECLIPSE 6.5 STRL STRAW (GLOVE) ×1 IMPLANT
GLOVE SURG SS PI 8.0 STRL IVOR (GLOVE) ×3 IMPLANT
GOWN PREVENTION PLUS LG XLONG (DISPOSABLE) ×4 IMPLANT
GOWN STRL REIN XL XLG (GOWN DISPOSABLE) ×3 IMPLANT
HOLDER FOLEY CATH W/STRAP (MISCELLANEOUS) ×3 IMPLANT
NEEDLE HYPO 22GX1.5 SAFETY (NEEDLE) ×3 IMPLANT
PACK BASIN DAY SURGERY FS (CUSTOM PROCEDURE TRAY) ×3 IMPLANT
PACK CYSTOSCOPY (CUSTOM PROCEDURE TRAY) ×3 IMPLANT
PAD CLEANER CAUTERY TIP 5X5 (MISCELLANEOUS)
PENCIL BUTTON HOLSTER BLD 10FT (ELECTRODE) IMPLANT
PLUG CATH AND CAP STER (CATHETERS) ×3 IMPLANT
SLING SYSTEM SPARC (Sling) ×3 IMPLANT
SUT VIC AB 2-0 UR6 27 (SUTURE) ×3 IMPLANT
SYR BULB IRRIGATION 50ML (SYRINGE) ×3 IMPLANT
SYRINGE 10CC LL (SYRINGE) ×3 IMPLANT
TUBE CONNECTING 12X1/4 (SUCTIONS) ×3 IMPLANT
YANKAUER SUCT BULB TIP NO VENT (SUCTIONS) ×3 IMPLANT

## 2012-11-07 NOTE — Transfer of Care (Signed)
Immediate Anesthesia Transfer of Care Note  Patient: [name]  Procedure(s) Performed: Procedure(s) (LRB): SPARC SLING (N/A) CYSTOSCOPY (N/A)  Patient Location: Patient transported to PACU with oxygen via face mask at 4 Liters / Min  Anesthesia Type: General  Level of Consciousness: awake and alert   Airway & Oxygen Therapy: Patient Spontanous Breathing and Patient connected to face mask oxygen  Post-op Assessment: Report given to PACU RN and Post -op Vital signs reviewed and stable  Post vital signs: Reviewed and stable  Dentition: Teeth and oropharynx remain in pre-op condition  Complications: No apparent anesthesia complications

## 2012-11-07 NOTE — Brief Op Note (Signed)
11/07/2012  8:04 AM  PATIENT:  [name]  57 y.o. female  PRE-OPERATIVE DIAGNOSIS:  STRESS URINARY INCONTINENCE  POST-OPERATIVE DIAGNOSIS:  STRESS URINARY INCONTINENCE  PROCEDURE:  Procedure(s): SPARC SLING (N/A) CYSTOSCOPY (N/A)  SURGEON:  Surgeon(s) and Role:    * Anner Crete, MD - Primary  PHYSICIAN ASSISTANT:   ASSISTANTS: none   ANESTHESIA:   general  EBL:  Total I/O In: 800 [I.V.:800] Out: -   BLOOD ADMINISTERED:none  DRAINS: Urinary Catheter (Foley)   LOCAL MEDICATIONS USED:  LIDOCAINE   SPECIMEN:  No Specimen  DISPOSITION OF SPECIMEN:  N/A  COUNTS:  YES  TOURNIQUET:  * No tourniquets in log *  DICTATION: .Other Dictation: Dictation Number [id number]  PLAN OF CARE: Discharge to home after PACU  PATIENT DISPOSITION:  PACU - hemodynamically stable.   Delay start of Pharmacological VTE agent (>24hrs) due to surgical blood loss or risk of bleeding: yes

## 2012-11-07 NOTE — Interval H&P Note (Signed)
History and Physical Interval Note:  11/07/2012 7:15 AM  [name]  has presented today for surgery, with the diagnosis of STRESS URINARY INCONTINENCE  The various methods of treatment have been discussed with the patient and family. After consideration of risks, benefits and other options for treatment, the patient has consented to  Procedure(s): SPARC SLING (N/A) CYSTOSCOPY (N/A) as a surgical intervention .  The patient's history has been reviewed, patient examined, no change in status, stable for surgery.  I have reviewed the patient's chart and labs.  Questions were answered to the patient's satisfaction.     Kaliann Coryell J

## 2012-11-07 NOTE — Anesthesia Postprocedure Evaluation (Signed)
  Anesthesia Post-op Note  Patient: [name]  Procedure(s) Performed: Procedure(s) (LRB): SPARC SLING (N/A) CYSTOSCOPY (N/A)  Patient Location: PACU  Anesthesia Type: General  Level of Consciousness: awake and alert   Airway and Oxygen Therapy: Patient Spontanous Breathing  Post-op Pain: mild  Post-op Assessment: Post-op Vital signs reviewed, Patient's Cardiovascular Status Stable, Respiratory Function Stable, Patent Airway and No signs of Nausea or vomiting  Last Vitals:  Filed Vitals:   11/07/12 0815  BP: 113/62  Pulse: 82  Temp: 36.2 C  Resp: 12    Post-op Vital Signs: stable   Complications: No apparent anesthesia complications

## 2012-11-07 NOTE — Anesthesia Procedure Notes (Signed)
Procedure Name: LMA Insertion Date/Time: 11/07/2012 7:29 AM Performed by: Fran Lowes Pre-anesthesia Checklist: Patient identified, Emergency Drugs available, Suction available and Patient being monitored Patient Re-evaluated:Patient Re-evaluated prior to inductionOxygen Delivery Method: Circle System Utilized Preoxygenation: Pre-oxygenation with 100% oxygen Intubation Type: IV induction Ventilation: Mask ventilation without difficulty LMA: LMA inserted LMA Size: 4.0 Number of attempts: 1 Airway Equipment and Method: bite block Placement Confirmation: positive ETCO2 Tube secured with: Tape Dental Injury: Teeth and Oropharynx as per pre-operative assessment

## 2012-11-08 ENCOUNTER — Encounter (HOSPITAL_BASED_OUTPATIENT_CLINIC_OR_DEPARTMENT_OTHER): Payer: Self-pay | Admitting: Urology

## 2012-11-08 NOTE — Op Note (Signed)
[name], [name]               ACCOUNT NO.:  [id number]  MEDICAL RECORD NO.:  [id number]  LOCATION:                                 FACILITY:  PHYSICIAN:  Excell Seltzer. Annabell Howells, M.D.    DATE OF BIRTH:  July 06, 1956  DATE OF PROCEDURE:  11/07/2012 DATE OF DISCHARGE:                              OPERATIVE REPORT   PROCEDURE:  SPARC sling.  PREOPERATIVE DIAGNOSIS:  Stress urinary incontinence.  POSTOPERATIVE DIAGNOSIS:  Stress urinary incontinence.  SURGEON:  Excell Seltzer. Annabell Howells, M.D.  ANESTHESIA:  General.  SPECIMENS:  None.  DRAINS:  A 16-French Foley catheter.  BLOOD LOSS:  100 mL.  COMPLICATIONS:  None.  INDICATIONS:  [name] is a 57 year old white female with genuine stress incontinence.  She has elected a sling for correction.  FINDINGS AND PROCEDURE:  She was given Cipro.  She was taken to the operating room where general anesthetic was induced.  She was fitted with PAS hose and placed in lithotomy positions.  Her mons and genitalia were clipped.  She was prepped with Betadine solution and draped in usual sterile fashion.  A 16-French Foley catheter was inserted and a weighted vaginal retractor was placed.  The anterior vaginal wall was infiltrated with 5 mL of 1% lidocaine with epinephrine over the mid urethra.  Two small stab wounds were made just above the pubis 2 cm lateral to the midline on each side. The fat was spread to the fascia with a hemostat.  Midline anterior vaginal wall incision was made with a knife over the mid urethral area and scissors were used to elevate the vaginal mucosa allowing entry to the pubourethral fascia on each side.  Once sufficient space allowed, tip of the finger was created, we turned our attention to placing the sling.  The [facility] trocar was brought through the right abdominal puncture down to the top of the pubis where it walked along the back the pubis until it could be felt with a finger in the right side of the vaginal incision.   The urethra was protected during this maneuver.  The trocar was then brought through into the vaginal vault.  This was then repeated on the left side.  Once the trocars had been passed, cystoscopy was performed using a 22- [place] scope and 70-degree lens.  Examination revealed a normal urethra. The bladder wall was smooth and pale.  No tumor, stones, or inflammation were noted.  Ureteral orifices were unremarkable.  No evidence of bladder wall injury was identified.  The bladder was drained.  The cystoscope was removed.  The [facility] mesh was then secured to the trocars and drawn into position.  Repeat cystoscopy once again revealed no evidence of bladder injury.  At this point, the abdominal ends of the mesh were cut allowing removal of the sheath.  The Foley had been replaced and a small right angle clamp was used to provide appropriate spacing between the mesh and the urethra during sheath removal.  Once the sheaths were removed, the tension was adjusted, and the vaginal wall was then closed using a running locked 2-0 Vicryl suture.  There was some brisk venous bleeding during the procedure, but once  the incision was closed, no significant bleeding or further filling of the vaginal submucosa was noted.  The vault was aspirated free of blood and a 2-inch iodoform vaginal pack was placed.  The redundant ends of the mesh were then trimmed allowing the tips to fall back in the abdominal subcutaneous space.  The wound was cleaned, dried, and then sealed with Dermabond.  The patient was taken down from lithotomy position.  The Foley catheter was placed to straight drainage.  Her anesthetic was reversed.  She was moved to recovery in stable condition.  There were no complications. The catheter and pack will be left in for 2 hours, then removed.  She will be able to go home when voiding.     Excell Seltzer. Annabell Howells, M.D.     JJW/MEDQ  D:  11/07/2012  T:  11/08/2012  Job:  [id number]

## 2012-11-28 NOTE — Telephone Encounter (Signed)
Pt informed and voiced understanding

## 2012-11-28 NOTE — Telephone Encounter (Signed)
Unfortunately all the pain clinics I know require old records

## 2012-11-28 NOTE — Telephone Encounter (Signed)
Patient called in today. She went in to [facility] to see if she could get her records quicker. I sent record requests for the patient on 10/24/12 & to [facility] on 11/22/12.  The office in [place] told her that they would request them from their medical records department in [place] and that it would take 6 weeks to get. Patient wants to know if there is another pain clinic that she can go to that doesn't require prior records.

## 2012-11-28 NOTE — Telephone Encounter (Signed)
Please advise 

## 2012-12-27 ENCOUNTER — Ambulatory Visit (INDEPENDENT_AMBULATORY_CARE_PROVIDER_SITE_OTHER): Payer: BC Managed Care – PPO | Admitting: Family Medicine

## 2012-12-27 ENCOUNTER — Encounter: Payer: Self-pay | Admitting: Family Medicine

## 2012-12-27 VITALS — BP 100/68 | HR 77 | Temp 98.0°F | Ht 65.0 in | Wt 193.0 lb

## 2012-12-27 DIAGNOSIS — K1120 Sialoadenitis, unspecified: Secondary | ICD-10-CM

## 2012-12-27 DIAGNOSIS — R1011 Right upper quadrant pain: Secondary | ICD-10-CM

## 2012-12-27 DIAGNOSIS — K3189 Other diseases of stomach and duodenum: Secondary | ICD-10-CM

## 2012-12-27 DIAGNOSIS — R110 Nausea: Secondary | ICD-10-CM

## 2012-12-27 DIAGNOSIS — R1013 Epigastric pain: Secondary | ICD-10-CM

## 2012-12-27 DIAGNOSIS — Z79899 Other long term (current) drug therapy: Secondary | ICD-10-CM

## 2012-12-27 DIAGNOSIS — I889 Nonspecific lymphadenitis, unspecified: Secondary | ICD-10-CM

## 2012-12-27 DIAGNOSIS — E785 Hyperlipidemia, unspecified: Secondary | ICD-10-CM

## 2012-12-27 DIAGNOSIS — M545 Low back pain: Secondary | ICD-10-CM

## 2012-12-27 DIAGNOSIS — G2581 Restless legs syndrome: Secondary | ICD-10-CM

## 2012-12-27 DIAGNOSIS — G4700 Insomnia, unspecified: Secondary | ICD-10-CM

## 2012-12-27 MED ORDER — PROMETHAZINE HCL 25 MG PO TABS: 25.0000 mg | ORAL_TABLET | Freq: Three times a day (TID) | ORAL | Status: DC | PRN

## 2012-12-27 MED ORDER — SIMVASTATIN 20 MG PO TABS: 20.0000 mg | ORAL_TABLET | Freq: Every day | ORAL | Status: DC

## 2012-12-27 MED ORDER — METHOCARBAMOL 500 MG PO TABS: 500.0000 mg | ORAL_TABLET | Freq: Three times a day (TID) | ORAL | Status: DC | PRN

## 2012-12-27 MED ORDER — NEOMYCIN-POLYMYXIN-HC 1 % OT SOLN: 3.0000 [drp] | Freq: Three times a day (TID) | OTIC | Status: DC

## 2012-12-27 MED ORDER — TRAMADOL HCL 50 MG PO TABS: 50.0000 mg | ORAL_TABLET | Freq: Two times a day (BID) | ORAL | Status: DC | PRN

## 2012-12-27 MED ORDER — DIAZEPAM 10 MG PO TABS: 10.0000 mg | ORAL_TABLET | Freq: Two times a day (BID) | ORAL | Status: DC | PRN

## 2012-12-27 NOTE — Patient Instructions (Signed)
Labs prior to visit, lipid, renal, hepatic, tsh, cbc  Back Pain, Adult Low back pain is very common. About 1 in 5 people have back pain.The cause of low back pain is rarely dangerous. The pain often gets better over time.About half of people with a sudden onset of back pain feel better in just 2 weeks. About 8 in 10 people feel better by 6 weeks.  CAUSES Some common causes of back pain include:  Strain of the muscles or ligaments supporting the spine.  Wear and tear (degeneration) of the spinal discs.  Arthritis.  Direct injury to the back. DIAGNOSIS Most of the time, the direct cause of low back pain is not known.However, back pain can be treated effectively even when the exact cause of the pain is unknown.Answering your caregiver's questions about your overall health and symptoms is one of the most accurate ways to make sure the cause of your pain is not dangerous. If your caregiver needs more information, he or she may order lab work or imaging tests (X-rays or MRIs).However, even if imaging tests show changes in your back, this usually does not require surgery. HOME CARE INSTRUCTIONS For many people, back pain returns.Since low back pain is rarely dangerous, it is often a condition that people can learn to [facility] their own.   Remain active. It is stressful on the back to sit or stand in one place. Do not sit, drive, or stand in one place for more than 30 minutes at a time. Take short walks on level surfaces as soon as pain allows.Try to increase the length of time you walk each day.  Do not stay in bed.Resting more than 1 or 2 days can delay your recovery.  Do not avoid exercise or work.Your body is made to move.It is not dangerous to be active, even though your back may hurt.Your back will likely heal faster if you return to being active before your pain is gone.  Pay attention to your body when you bend and lift. Many people have less discomfortwhen lifting if they  bend their knees, keep the load close to their bodies,and avoid twisting. Often, the most comfortable positions are those that put less stress on your recovering back.  Find a comfortable position to sleep. Use a firm mattress and lie on your side with your knees slightly bent. If you lie on your back, put a pillow under your knees.  Only take over-the-counter or prescription medicines as directed by your caregiver. Over-the-counter medicines to reduce pain and inflammation are often the most helpful.Your caregiver may prescribe muscle relaxant drugs.These medicines help dull your pain so you can more quickly return to your normal activities and healthy exercise.  Put ice on the injured area.  Put ice in a plastic bag.  Place a towel between your skin and the bag.  Leave the ice on for 15-20 minutes, 3-4 times a day for the first 2 to 3 days. After that, ice and heat may be alternated to reduce pain and spasms.  Ask your caregiver about trying back exercises and gentle massage. This may be of some benefit.  Avoid feeling anxious or stressed.Stress increases muscle tension and can worsen back pain.It is important to recognize when you are anxious or stressed and learn ways to manage it.Exercise is a great option. SEEK MEDICAL CARE IF:  You have pain that is not relieved with rest or medicine.  You have pain that does not improve in 1 week.  You have  new symptoms.  You are generally not feeling well. SEEK IMMEDIATE MEDICAL CARE IF:   You have pain that radiates from your back into your legs.  You develop new bowel or bladder control problems.  You have unusual weakness or numbness in your arms or legs.  You develop nausea or vomiting.  You develop abdominal pain.  You feel faint. Document Released: 06/28/2005 Document Revised: 12/28/2011 Document Reviewed: 11/16/2010 [facility] Patient Information 2014 [place], [place].

## 2012-12-27 NOTE — Assessment & Plan Note (Signed)
Tolerating Simvastatin, continue to avoid trans fats and continue fatty acids.

## 2012-12-27 NOTE — Progress Notes (Signed)
Patient ID: [name], female   DOB: [birth year], 57 y.o.   MRN: [id number] BEE MARCHIANO [id number] 1955-07-28 12/27/2012      Progress Note-Follow Up  Subjective  Chief Complaint  Chief Complaint  Patient presents with  . Back Pain    no disc left in L5    HPI  Patient is a 57 year old Caucasian female who is in today for followup. She has chosen not to continue with pain management due to the difficulty of the office situation there. Instead is been managing with tramadol for her pain. She weaned herself off the oxycodone and other than nausea she was able to get through without incident. She denies any worsening of pain. No recent illness. No fevers or chills. No chest pain, palpitations, shortness of breath. Requip is been helpful for her restless leg syndrome.  Past Medical History  Diagnosis Date  . Allergy     seasonal  . Anxiety   . Depression   . Hyperlipidemia   . History of proteinuria syndrome   . Edema extremities   . RLS (restless legs syndrome)   . Dyspareunia   . Obese   . Onychomycosis 05/09/2011  . Low back pain 05/09/2011  . RUQ pain 09/20/2011  . Hip pain, left 11/01/2011  . Lymphadenitis 06/16/2012  . Otitis externa 06/23/2012    left  . GERD (gastroesophageal reflux disease)     Past Surgical History  Procedure Laterality Date  . Cesarean section  1993  . Back surgery  2006    low, discectomy for herniated disc  . Tubal ligation  1993  . Carpal tunnel release Right 2006  . Bladder suspension N/A 11/07/2012    Procedure: [facility];  Surgeon: Anner Crete, MD;  Location: [facility];  Service: Urology;  Laterality: N/A;  . Cystoscopy N/A 11/07/2012    Procedure: CYSTOSCOPY;  Surgeon: Anner Crete, MD;  Location: [facility];  Service: Urology;  Laterality: N/A;    Family History  Problem Relation Age of Onset  . Cancer Mother 30    thyroid  . Arthritis Mother     nreck, back  . Hepatitis Sister    Hepatitis C  . Diabetes Maternal Grandmother     History   Social History  . Marital Status: Married    Spouse Name: N/A    Number of Children: N/A  . Years of Education: N/A   Occupational History  . Not on file.   Social History Main Topics  . Smoking status: Former Smoker -- 0.50 packs/day for 20 years    Types: Cigarettes    Quit date: 07/12/1992  . Smokeless tobacco: Never Used  . Alcohol Use: Yes     Comment: occasionaly  . Drug Use: No  . Sexually Active: Yes -- Female partner(s)   Other Topics Concern  . Not on file   Social History Narrative  . No narrative on file    Current Outpatient Prescriptions on File Prior to Visit  Medication Sig Dispense Refill  . BIOTIN PO Take by mouth.      . Calcium Carbonate-Vitamin D (CALTRATE 600+D) 600-400 MG-UNIT per tablet Take 1 tablet by mouth daily.       . Cholecalciferol (VITAMIN D3) 1000 UNITS CAPS Take by mouth.        . docusate sodium (COLACE) 100 MG capsule Take 1 capsule (100 mg total) by mouth 2 (two) times daily.  60 capsule  2  .  FLAXSEED, LINSEED, PO Take by mouth every other day.       . furosemide (LASIX) 40 MG tablet TAKE 1 TABLET BY MOUTH DAILY & A 2ND TABLET AS NEEDED FOR INCREASED EDEMA  45 tablet  5  . lamoTRIgine (LAMICTAL) 200 MG tablet Take 200 mg by mouth daily.        Marland Kitchen omeprazole (PRILOSEC) 40 MG capsule Take 40 mg by mouth daily.      . potassium chloride SA (K-DUR,KLOR-CON) 20 MEQ tablet TAKE 1 TABLET BY MOUTH DAILY & A 2ND TABLET AS NEEDED W/ INCREASED LASIXUSE  45 tablet  5  . rOPINIRole (REQUIP) 1 MG tablet Take 1 mg by mouth daily.        Marland Kitchen venlafaxine XR (EFFEXOR-XR) 150 MG 24 hr capsule Take 150 mg by mouth daily.       No current facility-administered medications on file prior to visit.    Allergies  Allergen Reactions  . Ceclor (Cefaclor)     Tongue swell    Review of Systems  Review of Systems  Constitutional: Negative for fever and malaise/fatigue.  HENT: Negative for  congestion.   Eyes: Negative for discharge.  Respiratory: Negative for shortness of breath.   Cardiovascular: Negative for chest pain, palpitations and leg swelling.  Gastrointestinal: Negative for nausea, abdominal pain and diarrhea.  Genitourinary: Negative for dysuria.  Musculoskeletal: Negative for falls.  Skin: Negative for rash.  Neurological: Negative for loss of consciousness and headaches.  Endo/Heme/Allergies: Negative for polydipsia.  Psychiatric/Behavioral: Negative for depression and suicidal ideas. The patient is not nervous/anxious and does not have insomnia.     Objective  BP 100/68  Pulse 77  Temp(Src) 98 F (36.7 C) (Oral)  Ht 5\' 5"  (1.651 m)  Wt 193 lb (87.544 kg)  BMI 32.12 kg/m2  SpO2 97%  LMP 10/03/2012  Physical Exam  Physical Exam  Constitutional: She is oriented to person, place, and time and well-developed, well-nourished, and in no distress. No distress.  HENT:  Head: Normocephalic and atraumatic.  Eyes: Conjunctivae are normal.  Neck: Neck supple. No thyromegaly present.  Cardiovascular: Normal rate, regular rhythm and normal heart sounds.   No murmur heard. Pulmonary/Chest: Effort normal and breath sounds normal. She has no wheezes.  Abdominal: She exhibits no distension and no mass.  Musculoskeletal: She exhibits no edema.  Lymphadenopathy:    She has no cervical adenopathy.  Neurological: She is alert and oriented to person, place, and time.  Skin: Skin is warm and dry. No rash noted. She is not diaphoretic.  Psychiatric: Memory, affect and judgment normal.    Lab Results  Component Value Date   TSH 1.202 11/01/2011   Lab Results  Component Value Date   WBC 6.9 06/16/2012   HGB 14.0 11/07/2012   HCT 40.9 06/16/2012   MCV 91.0 06/16/2012   PLT 240.0 06/16/2012   Lab Results  Component Value Date   CREATININE 0.75 11/01/2011   BUN 14 11/01/2011   NA 139 11/01/2011   K 4.0 11/01/2011   CL 101 11/01/2011   CO2 29 11/01/2011   Lab  Results  Component Value Date   ALT 22 09/22/2011   AST 26 09/22/2011   ALKPHOS 47 09/22/2011   BILITOT 0.5 09/22/2011   Lab Results  Component Value Date   CHOL 169 04/27/2012   Lab Results  Component Value Date   HDL 43.20 04/27/2012   Lab Results  Component Value Date   LDLCALC 100* 11/01/2011   Lab  Results  Component Value Date   TRIG 308.0* 04/27/2012   Lab Results  Component Value Date   CHOLHDL 4 04/27/2012     Assessment & Plan  Low back pain Has finished with Pain management clinic as of this month. Is off of Oxycodone after trouble with nausea. Now using Tramadol in am and only an occasional Tramadol in pm to manage the pain, would like to hold off on further referral to another pain management clinic. Has documented disc disease in lower back  Hyperlipidemia Tolerating Simvastatin, continue to avoid trans fats and continue fatty acids.  RLS (restless legs syndrome) requip has been helpful, will continue the same

## 2012-12-27 NOTE — Assessment & Plan Note (Signed)
Has finished with Pain management clinic as of this month. Is off of Oxycodone after trouble with nausea. Now using Tramadol in am and only an occasional Tramadol in pm to manage the pain, would like to hold off on further referral to another pain management clinic. Has documented disc disease in lower back

## 2012-12-27 NOTE — Assessment & Plan Note (Signed)
requip has been helpful, will continue the same

## 2013-01-31 NOTE — Addendum Note (Signed)
Addended by: Regis Bill on: 01/31/2013 02:42 PM   Modules accepted: Orders

## 2013-02-01 LAB — LIPID PANEL
Cholesterol: 161 mg/dL (ref 0–200)
HDL: 31 mg/dL — ABNORMAL LOW (ref >39)
LDL Cholesterol: 65 mg/dL (ref 0–99)
Triglycerides: 323 mg/dL — ABNORMAL HIGH (ref <150)
VLDL: 65 mg/dL — ABNORMAL HIGH (ref 0–40)

## 2013-02-01 LAB — CBC
MCH: 28.9 pg (ref 26.0–34.0)
MCHC: 32.4 g/dL (ref 30.0–36.0)
Platelets: 276 10*3/uL (ref 150–400)
RDW: 14.2 % (ref 11.5–15.5)

## 2013-02-01 LAB — HEPATIC FUNCTION PANEL
AST: 17 U/L (ref 0–37)
Albumin: 3.8 g/dL (ref 3.5–5.2)
Alkaline Phosphatase: 35 U/L — ABNORMAL LOW (ref 39–117)
Total Protein: 6.1 g/dL (ref 6.0–8.3)

## 2013-02-01 LAB — RENAL FUNCTION PANEL
Albumin: 3.8 g/dL (ref 3.5–5.2)
BUN: 10 mg/dL (ref 6–23)
CO2: 25 mEq/L (ref 19–32)
Chloride: 107 mEq/L (ref 96–112)
Creat: 0.71 mg/dL (ref 0.50–1.10)

## 2013-02-01 LAB — TSH: TSH: 1.205 u[IU]/mL (ref 0.350–4.500)

## 2013-02-06 ENCOUNTER — Encounter: Payer: Self-pay | Admitting: Family Medicine

## 2013-02-06 ENCOUNTER — Ambulatory Visit (INDEPENDENT_AMBULATORY_CARE_PROVIDER_SITE_OTHER): Payer: BC Managed Care – PPO | Admitting: Family Medicine

## 2013-02-06 VITALS — BP 102/60 | HR 82 | Temp 97.8°F | Ht 65.0 in | Wt 195.0 lb

## 2013-02-06 DIAGNOSIS — E785 Hyperlipidemia, unspecified: Secondary | ICD-10-CM

## 2013-02-06 DIAGNOSIS — R609 Edema, unspecified: Secondary | ICD-10-CM

## 2013-02-06 DIAGNOSIS — F341 Dysthymic disorder: Secondary | ICD-10-CM

## 2013-02-06 DIAGNOSIS — M545 Low back pain, unspecified: Secondary | ICD-10-CM

## 2013-02-06 DIAGNOSIS — F418 Other specified anxiety disorders: Secondary | ICD-10-CM

## 2013-02-06 DIAGNOSIS — R600 Localized edema: Secondary | ICD-10-CM

## 2013-02-06 DIAGNOSIS — G4700 Insomnia, unspecified: Secondary | ICD-10-CM

## 2013-02-06 MED ORDER — DIAZEPAM 10 MG PO TABS: 10.0000 mg | ORAL_TABLET | Freq: Two times a day (BID) | ORAL | Status: DC | PRN

## 2013-02-06 MED ORDER — METHYLPREDNISOLONE (PAK) 4 MG PO TABS: ORAL_TABLET | ORAL | Status: DC

## 2013-02-06 MED ORDER — METHOCARBAMOL 500 MG PO TABS: 500.0000 mg | ORAL_TABLET | Freq: Three times a day (TID) | ORAL | Status: DC | PRN

## 2013-02-06 NOTE — Patient Instructions (Addendum)
Robaxin 3 x a day, Valium and Tramadol 2 x a day and the medrol dose pak for next 3-5 days, if no improvement will need xray For edema take Lasix 3 x a day for 2 days, twice a day for 2 days then as needed   Edema Edema is an abnormal build-up of fluids in tissues. Because this is partly dependent on gravity (water flows to the lowest place), it is more common in the legs and thighs (lower extremities). It is also common in the looser tissues, like around the eyes. Painless swelling of the feet and ankles is common and increases as a person ages. It may affect both legs and may include the calves or even thighs. When squeezed, the fluid may move out of the affected area and may leave a dent for a few moments. CAUSES   Prolonged standing or sitting in one place for extended periods of time. Movement helps pump tissue fluid into the veins, and absence of movement prevents this, resulting in edema.  Varicose veins. The valves in the veins do not work as well as they should. This causes fluid to leak into the tissues.  Fluid and salt overload.  Injury, burn, or surgery to the leg, ankle, or foot, may damage veins and allow fluid to leak out.  Sunburn damages vessels. Leaky vessels allow fluid to go out into the sunburned tissues.  Allergies (from insect bites or stings, medications or chemicals) cause swelling by allowing vessels to become leaky.  Protein in the blood helps keep fluid in your vessels. Low protein, as in malnutrition, allows fluid to leak out.  Hormonal changes, including pregnancy and menstruation, cause fluid retention. This fluid may leak out of vessels and cause edema.  Medications that cause fluid retention. Examples are sex hormones, blood pressure medications, steroid treatment, or anti-depressants.  Some illnesses cause edema, especially heart failure, kidney disease, or liver disease.  Surgery that cuts veins or lymph nodes, such as surgery done for the heart or for  breast cancer, may result in edema. DIAGNOSIS  Your caregiver is usually easily able to determine what is causing your swelling (edema) by simply asking what is wrong (getting a history) and examining you (doing a physical). Sometimes x-rays, EKG (electrocardiogram or heart tracing), and blood work may be done to evaluate for underlying medical illness. TREATMENT  General treatment includes:  Leg elevation (or elevation of the affected body part).  Restriction of fluid intake.  Prevention of fluid overload.  Compression of the affected body part. Compression with elastic bandages or support stockings squeezes the tissues, preventing fluid from entering and forcing it back into the blood vessels.  Diuretics (also called water pills or fluid pills) pull fluid out of your body in the form of increased urination. These are effective in reducing the swelling, but can have side effects and must be used only under your caregiver's supervision. Diuretics are appropriate only for some types of edema. The specific treatment can be directed at any underlying causes discovered. Heart, liver, or kidney disease should be treated appropriately. HOME CARE INSTRUCTIONS   Elevate the legs (or affected body part) above the level of the heart, while lying down.  Avoid sitting or standing still for prolonged periods of time.  Avoid putting anything directly under the knees when lying down, and do not wear constricting clothing or garters on the upper legs.  Exercising the legs causes the fluid to work back into the veins and lymphatic channels. This may  help the swelling go down.  The pressure applied by elastic bandages or support stockings can help reduce ankle swelling.  A low-salt diet may help reduce fluid retention and decrease the ankle swelling.  Take any medications exactly as prescribed. SEEK MEDICAL CARE IF:  Your edema is not responding to recommended treatments. SEEK IMMEDIATE MEDICAL CARE  IF:   You develop shortness of breath or chest pain.  You cannot breathe when you lay down; or if, while lying down, you have to get up and go to the window to get your breath.  You are having increasing swelling without relief from treatment.  You develop a fever over 102 F (38.9 C).  You develop pain or redness in the areas that are swollen.  Tell your caregiver right away if you have gained 3 lb/1.4 kg in 1 day or 5 lb/2.3 kg in a week. MAKE SURE YOU:   Understand these instructions.  Will watch your condition.  Will get help right away if you are not doing well or get worse. Document Released: 06/28/2005 Document Revised: 12/28/2011 Document Reviewed: 02/14/2008 [facility] Patient Information 2014 [place], [place].

## 2013-02-07 LAB — URINALYSIS
Leukocytes, UA: NEGATIVE
Nitrite: NEGATIVE
Protein, ur: 30 mg/dL — AB
Urobilinogen, UA: 0.2 mg/dL (ref 0.0–1.0)

## 2013-02-07 MED ORDER — SULFAMETHOXAZOLE-TMP DS 800-160 MG PO TABS: 1.0000 | ORAL_TABLET | Freq: Two times a day (BID) | ORAL | Status: DC

## 2013-02-08 ENCOUNTER — Encounter: Payer: Self-pay | Admitting: Family Medicine

## 2013-02-08 LAB — URINE CULTURE

## 2013-02-08 NOTE — Progress Notes (Signed)
Quick Note:  Patient Informed and voiced understanding.  Pt states she didn't pick up the bactrim but is feeling a little better. Pt stated she doesn't think she has a uti because it doesn't burn when she pees. ______

## 2013-02-08 NOTE — Progress Notes (Signed)
Patient ID: [name], female   DOB: Nov 25, 1955, 57 y.o.   MRN: [id number] [name] [id number] August 15, 1955 02/08/2013      Progress Note-Follow Up  Subjective  Chief Complaint  Chief Complaint  Patient presents with  . Follow-up    HPI  Patient is a 57 year old Caucasian female who is in today for followup. Her blood has been flared for a couple of weeks. She has trouble is in any position. She has to change positions frequently and she does not sitting, standing, lying down all her she continues to struggle with lower extremity edema that first night. No fevers, chills, cp, palp, sob, gi or gu c/o. Still struggling with depression but it is improved. No suicidal ideation  Past Medical History  Diagnosis Date  . Allergy     seasonal  . Anxiety   . Depression   . Hyperlipidemia   . History of proteinuria syndrome   . Edema extremities   . RLS (restless legs syndrome)   . Dyspareunia   . Obese   . Onychomycosis 05/09/2011  . Low back pain 05/09/2011  . RUQ pain 09/20/2011  . Hip pain, left 11/01/2011  . Lymphadenitis 06/16/2012  . Otitis externa 06/23/2012    left  . GERD (gastroesophageal reflux disease)     Past Surgical History  Procedure Laterality Date  . Cesarean section  1993  . Back surgery  2006    low, discectomy for herniated disc  . Tubal ligation  1993  . Carpal tunnel release Right 2006  . Bladder suspension N/A 11/07/2012    Procedure: [facility];  Surgeon: Anner Crete, MD;  Location: [facility];  Service: Urology;  Laterality: N/A;  . Cystoscopy N/A 11/07/2012    Procedure: CYSTOSCOPY;  Surgeon: Anner Crete, MD;  Location: [facility];  Service: Urology;  Laterality: N/A;    Family History  Problem Relation Age of Onset  . Cancer Mother 32    thyroid  . Arthritis Mother     nreck, back  . Hepatitis Sister     Hepatitis C  . Diabetes Maternal Grandmother     History   Social History  . Marital  Status: Married    Spouse Name: N/A    Number of Children: N/A  . Years of Education: N/A   Occupational History  . Not on file.   Social History Main Topics  . Smoking status: Former Smoker -- 0.50 packs/day for 20 years    Types: Cigarettes    Quit date: 07/12/1992  . Smokeless tobacco: Never Used  . Alcohol Use: Yes     Comment: occasionaly  . Drug Use: No  . Sexually Active: Yes -- Female partner(s)   Other Topics Concern  . Not on file   Social History Narrative  . No narrative on file    Current Outpatient Prescriptions on File Prior to Visit  Medication Sig Dispense Refill  . BIOTIN PO Take by mouth.      . Calcium Carbonate-Vitamin D (CALTRATE 600+D) 600-400 MG-UNIT per tablet Take 1 tablet by mouth daily.       . Cholecalciferol (VITAMIN D3) 1000 UNITS CAPS Take by mouth.        . docusate sodium (COLACE) 100 MG capsule Take 1 capsule (100 mg total) by mouth 2 (two) times daily.  60 capsule  2  . estradiol (ESTRACE) 2 MG tablet Take 2 mg by mouth daily.      Marland Kitchen  FLAXSEED, LINSEED, PO Take by mouth every other day.       . furosemide (LASIX) 40 MG tablet TAKE 1 TABLET BY MOUTH DAILY & A 2ND TABLET AS NEEDED FOR INCREASED EDEMA  45 tablet  5  . lamoTRIgine (LAMICTAL) 200 MG tablet Take 200 mg by mouth daily.        . NEOMYCIN-POLYMYXIN-HC, OTIC, (CORTISPORIN) 1 % SOLN Place 3 drops into both ears every 8 (eight) hours. Itching or irritation  10 mL  0  . omeprazole (PRILOSEC) 40 MG capsule Take 40 mg by mouth daily.      . potassium chloride SA (K-DUR,KLOR-CON) 20 MEQ tablet TAKE 1 TABLET BY MOUTH DAILY & A 2ND TABLET AS NEEDED W/ INCREASED LASIXUSE  45 tablet  5  . promethazine (PHENERGAN) 25 MG tablet Take 1 tablet (25 mg total) by mouth every 8 (eight) hours as needed for nausea.  40 tablet  2  . rOPINIRole (REQUIP) 1 MG tablet Take 1 mg by mouth daily.        . simvastatin (ZOCOR) 20 MG tablet Take 1 tablet (20 mg total) by mouth at bedtime.  90 tablet  1  . traMADol  (ULTRAM) 50 MG tablet Take 1 tablet (50 mg total) by mouth 2 (two) times daily as needed for pain.  60 tablet  2  . venlafaxine XR (EFFEXOR-XR) 150 MG 24 hr capsule Take 150 mg by mouth daily.       No current facility-administered medications on file prior to visit.    Allergies  Allergen Reactions  . Ceclor (Cefaclor)     Tongue swell    Review of Systems  Review of Systems  Constitutional: Negative for fever and malaise/fatigue.  HENT: Negative for congestion.   Eyes: Negative for pain and discharge.  Respiratory: Negative for shortness of breath.   Cardiovascular: Positive for leg swelling. Negative for chest pain and palpitations.  Gastrointestinal: Negative for nausea, abdominal pain and diarrhea.  Genitourinary: Negative for dysuria.  Musculoskeletal: Positive for myalgias and back pain. Negative for falls.  Skin: Negative for rash.  Neurological: Negative for loss of consciousness and headaches.  Endo/Heme/Allergies: Negative for polydipsia.  Psychiatric/Behavioral: Positive for depression. Negative for suicidal ideas. The patient is nervous/anxious. The patient does not have insomnia.     Objective  BP 102/60  Pulse 82  Temp(Src) 97.8 F (36.6 C) (Oral)  Ht 5\' 5"  (1.651 m)  Wt 195 lb (88.451 kg)  BMI 32.45 kg/m2  SpO2 97%  LMP 10/03/2012  Physical Exam  Physical Exam  Constitutional: She is oriented to person, place, and time and well-developed, well-nourished, and in no distress. No distress.  HENT:  Head: Normocephalic and atraumatic.  Eyes: Conjunctivae are normal.  Neck: Neck supple. No thyromegaly present.  Cardiovascular: Normal rate and regular rhythm.  Exam reveals no gallop.   No murmur heard. Pulmonary/Chest: Effort normal and breath sounds normal. She has no wheezes.  Abdominal: She exhibits no distension and no mass.  Musculoskeletal: She exhibits no edema.  Lymphadenopathy:    She has no cervical adenopathy.  Neurological: She is alert and  oriented to person, place, and time.  Skin: Skin is warm and dry. No rash noted. She is not diaphoretic.  Psychiatric: Memory, affect and judgment normal.    Lab Results  Component Value Date   TSH 1.205 01/31/2013   Lab Results  Component Value Date   WBC 7.1 01/31/2013   HGB 13.4 01/31/2013   HCT 41.3 01/31/2013  MCV 89.0 01/31/2013   PLT 276 01/31/2013   Lab Results  Component Value Date   CREATININE 0.71 01/31/2013   BUN 10 01/31/2013   NA 140 01/31/2013   K 4.5 01/31/2013   CL 107 01/31/2013   CO2 25 01/31/2013   Lab Results  Component Value Date   ALT 14 01/31/2013   AST 17 01/31/2013   ALKPHOS 35* 01/31/2013   BILITOT 0.5 01/31/2013   Lab Results  Component Value Date   CHOL 161 01/31/2013   Lab Results  Component Value Date   HDL 31* 01/31/2013   Lab Results  Component Value Date   LDLCALC 65 01/31/2013   Lab Results  Component Value Date   TRIG 323* 01/31/2013   Lab Results  Component Value Date   CHOLHDL 5.2 01/31/2013     Assessment & Plan  Hyperlipidemia Tolerating Zocor, avoid trans fats, add krill oil caps  Edema extremities Minimize sodium elevate feet. Furosemide as prescribed  Low back pain Flared, moist heat, gentle stretching, Tramdol prn. Medrol dosepak and Robaxin prn  Depression with anxiety Doing well on Venlafaxine

## 2013-02-08 NOTE — Progress Notes (Signed)
Quick Note:  Patient Informed and voiced understanding ______ 

## 2013-02-11 NOTE — Assessment & Plan Note (Signed)
Tolerating Zocor, avoid trans fats, add krill oil caps

## 2013-02-11 NOTE — Assessment & Plan Note (Addendum)
Minimize sodium elevate feet. Furosemide as prescribed

## 2013-02-12 NOTE — Assessment & Plan Note (Signed)
Flared, moist heat, gentle stretching, Tramdol prn. Medrol dosepak and Robaxin prn

## 2013-02-12 NOTE — Assessment & Plan Note (Signed)
Doing well on Venlafaxine

## 2013-03-05 ENCOUNTER — Ambulatory Visit (INDEPENDENT_AMBULATORY_CARE_PROVIDER_SITE_OTHER): Payer: BC Managed Care – PPO | Admitting: Family Medicine

## 2013-03-05 ENCOUNTER — Encounter: Payer: Self-pay | Admitting: Family Medicine

## 2013-03-05 VITALS — BP 110/62 | HR 88 | Temp 97.8°F | Ht 65.0 in | Wt 191.1 lb

## 2013-03-05 DIAGNOSIS — N951 Menopausal and female climacteric states: Secondary | ICD-10-CM

## 2013-03-05 DIAGNOSIS — E669 Obesity, unspecified: Secondary | ICD-10-CM

## 2013-03-05 DIAGNOSIS — G4700 Insomnia, unspecified: Secondary | ICD-10-CM

## 2013-03-05 DIAGNOSIS — M545 Low back pain: Secondary | ICD-10-CM

## 2013-03-05 DIAGNOSIS — E785 Hyperlipidemia, unspecified: Secondary | ICD-10-CM

## 2013-03-05 MED ORDER — PHENTERMINE HCL 15 MG PO CAPS: 15.0000 mg | ORAL_CAPSULE | ORAL | Status: DC

## 2013-03-05 NOTE — Patient Instructions (Addendum)
Move  Start a probiotic daily, generic is fine  DASH Diet The DASH diet stands for "Dietary Approaches to Stop Hypertension." It is a healthy eating plan that has been shown to reduce high blood pressure (hypertension) in as little as 14 days, while also possibly providing other significant health benefits. These other health benefits include reducing the risk of breast cancer after menopause and reducing the risk of type 2 diabetes, heart disease, colon cancer, and stroke. Health benefits also include weight loss and slowing kidney failure in patients with chronic kidney disease.  DIET GUIDELINES  Limit salt (sodium). Your diet should contain less than 1500 mg of sodium daily.  Limit refined or processed carbohydrates. Your diet should include mostly whole grains. Desserts and added sugars should be used sparingly.  Include small amounts of heart-healthy fats. These types of fats include nuts, oils, and tub margarine. Limit saturated and trans fats. These fats have been shown to be harmful in the body. CHOOSING FOODS  The following food groups are based on a 2000 calorie diet. See your Registered Dietitian for individual calorie needs. Grains and Grain Products (6 to 8 servings daily)  Eat More Often: Whole-wheat bread, brown rice, whole-grain or wheat pasta, quinoa, popcorn without added fat or salt (air popped).  Eat Less Often: White bread, white pasta, white rice, cornbread. Vegetables (4 to 5 servings daily)  Eat More Often: Fresh, frozen, and canned vegetables. Vegetables may be raw, steamed, roasted, or grilled with a minimal amount of fat.  Eat Less Often/Avoid: Creamed or fried vegetables. Vegetables in a cheese sauce. Fruit (4 to 5 servings daily)  Eat More Often: All fresh, canned (in natural juice), or frozen fruits. Dried fruits without added sugar. One hundred percent fruit juice ( cup [237 mL] daily).  Eat Less Often: Dried fruits with added sugar. Canned fruit in light  or heavy syrup. [employer], Fish, and Poultry (2 servings or less daily. One serving is 3 to 4 oz [85-114 g]).  Eat More Often: Ninety percent or leaner ground beef, tenderloin, sirloin. Round cuts of beef, chicken breast, [place] breast. All fish. Grill, bake, or broil your meat. Nothing should be fried.  Eat Less Often/Avoid: Fatty cuts of meat, [place], or chicken leg, thigh, or wing. Fried cuts of meat or fish. Dairy (2 to 3 servings)  Eat More Often: Low-fat or fat-free milk, low-fat plain or light yogurt, reduced-fat or part-skim cheese.  Eat Less Often/Avoid: Milk (whole, 2%).Whole milk yogurt. Full-fat cheeses. Nuts, Seeds, and Legumes (4 to 5 servings per week)  Eat More Often: All without added salt.  Eat Less Often/Avoid: Salted nuts and seeds, canned beans with added salt. Fats and Sweets (limited)  Eat More Often: Vegetable oils, tub margarines without trans fats, sugar-free gelatin. Mayonnaise and salad dressings.  Eat Less Often/Avoid: Coconut oils, palm oils, butter, stick margarine, cream, half and half, cookies, candy, pie. FOR MORE INFORMATION The Dash Diet Eating Plan: www.dashdiet.org Document Released: 06/17/2011 Document Revised: 09/20/2011 Document Reviewed: 06/17/2011 [facility] Patient Information 2014 [place], [place].

## 2013-03-11 ENCOUNTER — Encounter: Payer: Self-pay | Admitting: Family Medicine

## 2013-03-11 DIAGNOSIS — N951 Menopausal and female climacteric states: Secondary | ICD-10-CM

## 2013-03-11 DIAGNOSIS — G4700 Insomnia, unspecified: Secondary | ICD-10-CM

## 2013-03-11 HISTORY — DX: Insomnia, unspecified: G47.00

## 2013-03-11 HISTORY — DX: Menopausal and female climacteric states: N95.1

## 2013-03-11 NOTE — Assessment & Plan Note (Signed)
Tolerating Simvastatin, needs Krill oil caps daily, avoid trans fats.

## 2013-03-11 NOTE — Progress Notes (Signed)
Patient ID: [name], female   DOB: January 10, 1956, 57 y.o.   MRN: [id number] [name] [id number] [birth year] 03/11/2013      Progress Note-Follow Up  Subjective  Chief Complaint  Chief Complaint  Patient presents with  . Follow-up    1 month    HPI  Patient is a 57 year old Caucasian female who is in today for followup. She notes her back pain and lower extremity radicular symptoms are improved somewhat Robaxin. Tramadol is still helpful. She started estrogen recently for hot flashes and that has been helpful. Valium has been helping her sleep. No recent illness. No chest pain, palpitations, shortness of breath, GI or GU complaints at this time.  Past Medical History  Diagnosis Date  . Allergy     seasonal  . Anxiety   . Depression   . Hyperlipidemia   . History of proteinuria syndrome   . Edema extremities   . RLS (restless legs syndrome)   . Dyspareunia   . Obese   . Onychomycosis 05/09/2011  . Low back pain 05/09/2011  . RUQ pain 09/20/2011  . Hip pain, left 11/01/2011  . Lymphadenitis 06/16/2012  . Otitis externa 06/23/2012    left  . GERD (gastroesophageal reflux disease)   . Insomnia 03/11/2013  . Perimenopause 03/11/2013    Past Surgical History  Procedure Laterality Date  . Cesarean section  1993  . Back surgery  2006    low, discectomy for herniated disc  . Tubal ligation  1993  . Carpal tunnel release Right 2006  . Bladder suspension N/A 11/07/2012    Procedure: [facility];  Surgeon: Anner Crete, MD;  Location: [facility];  Service: Urology;  Laterality: N/A;  . Cystoscopy N/A 11/07/2012    Procedure: CYSTOSCOPY;  Surgeon: Anner Crete, MD;  Location: [facility];  Service: Urology;  Laterality: N/A;    Family History  Problem Relation Age of Onset  . Cancer Mother 51    thyroid  . Arthritis Mother     nreck, back  . Hepatitis Sister     Hepatitis C  . Diabetes Maternal Grandmother     History   Social  History  . Marital Status: Married    Spouse Name: N/A    Number of Children: N/A  . Years of Education: N/A   Occupational History  . Not on file.   Social History Main Topics  . Smoking status: Former Smoker -- 0.50 packs/day for 20 years    Types: Cigarettes    Quit date: 07/12/1992  . Smokeless tobacco: Never Used  . Alcohol Use: Yes     Comment: occasionaly  . Drug Use: No  . Sexual Activity: Yes    Partners: Male   Other Topics Concern  . Not on file   Social History Narrative  . No narrative on file    Current Outpatient Prescriptions on File Prior to Visit  Medication Sig Dispense Refill  . BIOTIN PO Take by mouth.      . Calcium Carbonate-Vitamin D (CALTRATE 600+D) 600-400 MG-UNIT per tablet Take 1 tablet by mouth daily.       . Cholecalciferol (VITAMIN D3) 1000 UNITS CAPS Take by mouth.        . diazepam (VALIUM) 10 MG tablet Take 1 tablet (10 mg total) by mouth every 12 (twelve) hours as needed for anxiety or sleep.  40 tablet  1  . docusate sodium (COLACE) 100 MG capsule Take 1  capsule (100 mg total) by mouth 2 (two) times daily.  60 capsule  2  . FLAXSEED, LINSEED, PO Take by mouth every other day.       . furosemide (LASIX) 40 MG tablet TAKE 1 TABLET BY MOUTH DAILY & A 2ND TABLET AS NEEDED FOR INCREASED EDEMA  45 tablet  5  . lamoTRIgine (LAMICTAL) 200 MG tablet Take 200 mg by mouth daily.        . methocarbamol (ROBAXIN) 500 MG tablet Take 1 tablet (500 mg total) by mouth 3 (three) times daily as needed.  90 tablet  2  . NEOMYCIN-POLYMYXIN-HC, OTIC, (CORTISPORIN) 1 % SOLN Place 3 drops into both ears every 8 (eight) hours. Itching or irritation  10 mL  0  . omeprazole (PRILOSEC) 40 MG capsule Take 40 mg by mouth daily.      . potassium chloride SA (K-DUR,KLOR-CON) 20 MEQ tablet TAKE 1 TABLET BY MOUTH DAILY & A 2ND TABLET AS NEEDED W/ INCREASED LASIXUSE  45 tablet  5  . promethazine (PHENERGAN) 25 MG tablet Take 1 tablet (25 mg total) by mouth every 8 (eight)  hours as needed for nausea.  40 tablet  2  . rOPINIRole (REQUIP) 1 MG tablet Take 1 mg by mouth daily.        . simvastatin (ZOCOR) 20 MG tablet Take 1 tablet (20 mg total) by mouth at bedtime.  90 tablet  1  . traMADol (ULTRAM) 50 MG tablet Take 1 tablet (50 mg total) by mouth 2 (two) times daily as needed for pain.  60 tablet  2  . venlafaxine XR (EFFEXOR-XR) 150 MG 24 hr capsule Take 150 mg by mouth daily.       No current facility-administered medications on file prior to visit.    Allergies  Allergen Reactions  . Ceclor [Cefaclor]     Tongue swell    Review of Systems  Review of Systems  Constitutional: Negative for fever and malaise/fatigue.  HENT: Negative for congestion.   Eyes: Negative for discharge.  Respiratory: Negative for shortness of breath.   Cardiovascular: Negative for chest pain, palpitations and leg swelling.  Gastrointestinal: Negative for nausea, abdominal pain and diarrhea.  Genitourinary: Negative for dysuria.  Musculoskeletal: Positive for back pain and joint pain. Negative for falls.  Skin: Negative for rash.  Neurological: Negative for loss of consciousness and headaches.  Endo/Heme/Allergies: Negative for polydipsia.  Psychiatric/Behavioral: Positive for depression. Negative for suicidal ideas. The patient has insomnia. The patient is not nervous/anxious.     Objective  BP 110/62  Pulse 88  Temp(Src) 97.8 F (36.6 C) (Oral)  Ht 5\' 5"  (1.651 m)  Wt 191 lb 1.3 oz (86.673 kg)  BMI 31.8 kg/m2  SpO2 97%  LMP 10/03/2012  Physical Exam  Physical Exam  Constitutional: She is oriented to person, place, and time and well-developed, well-nourished, and in no distress. No distress.  HENT:  Head: Normocephalic and atraumatic.  Eyes: Conjunctivae are normal.  Neck: Neck supple. No thyromegaly present.  Cardiovascular: Normal rate, regular rhythm and normal heart sounds.   No murmur heard. Pulmonary/Chest: Effort normal and breath sounds normal. She  has no wheezes.  Abdominal: She exhibits no distension and no mass.  Musculoskeletal: She exhibits no edema.  Lymphadenopathy:    She has no cervical adenopathy.  Neurological: She is alert and oriented to person, place, and time.  Skin: Skin is warm and dry. No rash noted. She is not diaphoretic.  Psychiatric: Memory, affect and judgment  normal.    Lab Results  Component Value Date   TSH 1.205 01/31/2013   Lab Results  Component Value Date   WBC 7.1 01/31/2013   HGB 13.4 01/31/2013   HCT 41.3 01/31/2013   MCV 89.0 01/31/2013   PLT 276 01/31/2013   Lab Results  Component Value Date   CREATININE 0.71 01/31/2013   BUN 10 01/31/2013   NA 140 01/31/2013   K 4.5 01/31/2013   CL 107 01/31/2013   CO2 25 01/31/2013   Lab Results  Component Value Date   ALT 14 01/31/2013   AST 17 01/31/2013   ALKPHOS 35* 01/31/2013   BILITOT 0.5 01/31/2013   Lab Results  Component Value Date   CHOL 161 01/31/2013   Lab Results  Component Value Date   HDL 31* 01/31/2013   Lab Results  Component Value Date   LDLCALC 65 01/31/2013   Lab Results  Component Value Date   TRIG 323* 01/31/2013   Lab Results  Component Value Date   CHOLHDL 5.2 01/31/2013     Assessment & Plan  Low back pain Good response to Robaxin, tolerating well. May continue Tramadol prn  Insomnia Good response to Valium prn, continue good sleep hygiene  Perimenopause Hot flashes were helped with addition of Estroven. Continue same  Hyperlipidemia Tolerating Simvastatin, needs Krill oil caps daily, avoid trans fats.

## 2013-03-11 NOTE — Assessment & Plan Note (Signed)
Hot flashes were helped with addition of Estroven. Continue same

## 2013-03-11 NOTE — Assessment & Plan Note (Signed)
Good response to Robaxin, tolerating well. May continue Tramadol prn

## 2013-03-11 NOTE — Assessment & Plan Note (Signed)
Good response to Valium prn, continue good sleep hygiene

## 2013-03-19 ENCOUNTER — Telehealth: Payer: Self-pay | Admitting: *Deleted

## 2013-03-19 DIAGNOSIS — M545 Low back pain: Secondary | ICD-10-CM

## 2013-03-19 DIAGNOSIS — G4700 Insomnia, unspecified: Secondary | ICD-10-CM

## 2013-03-19 NOTE — Telephone Encounter (Signed)
Faxed refill request received from pharmacy for Diazepam 10 mg Last filled by MD on 07.29.14, #[id number] Last AEX - 08.25.14 Next AEX - 4 Weeks, [has appt scheduled for 09.30.14] Please Advise/SLS

## 2013-03-19 NOTE — Telephone Encounter (Signed)
Not due til end of September, would have to come in sooner to get more sooner

## 2013-03-23 ENCOUNTER — Telehealth: Payer: Self-pay | Admitting: *Deleted

## 2013-03-23 NOTE — Telephone Encounter (Signed)
Faxed refill request received from pharmacy for Diazepam 10 mg Last filled by MD on 07.30.14, #40x1; phoned pharmacy & they reported that pt filled Rx on 07.30.14 & 08.16.14 Patient informed, understood & agreed; states she will keep her scheduled appt for 09.30.14/SLS  Per phone message from 09.08.14: Bradd Canary, MD at 03/19/2013 4:36 PM Not due til end of September, would have to come in sooner to get more sooner diazepam (VALIUM) 10 MG tablet 40 tablet 1 03/20/2013  Sig - Route: Take 1 tablet (10 mg total) by mouth every 12 (twelve) hours as needed for anxiety or sleep.  Oral Class: Print Reason for Refusal: Patient has requested refill too soon Refused By: April Paul Half, CMA

## 2013-03-26 ENCOUNTER — Encounter: Payer: Self-pay | Admitting: Family Medicine

## 2013-03-26 ENCOUNTER — Ambulatory Visit (INDEPENDENT_AMBULATORY_CARE_PROVIDER_SITE_OTHER): Payer: BC Managed Care – PPO | Admitting: Family Medicine

## 2013-03-26 VITALS — BP 120/70 | HR 97 | Temp 98.1°F | Ht 65.0 in | Wt 190.1 lb

## 2013-03-26 DIAGNOSIS — J209 Acute bronchitis, unspecified: Secondary | ICD-10-CM

## 2013-03-26 DIAGNOSIS — M545 Low back pain: Secondary | ICD-10-CM

## 2013-03-26 DIAGNOSIS — Z5189 Encounter for other specified aftercare: Secondary | ICD-10-CM

## 2013-03-26 DIAGNOSIS — R609 Edema, unspecified: Secondary | ICD-10-CM

## 2013-03-26 DIAGNOSIS — G4700 Insomnia, unspecified: Secondary | ICD-10-CM

## 2013-03-26 DIAGNOSIS — J019 Acute sinusitis, unspecified: Secondary | ICD-10-CM

## 2013-03-26 DIAGNOSIS — E785 Hyperlipidemia, unspecified: Secondary | ICD-10-CM

## 2013-03-26 DIAGNOSIS — T7840XD Allergy, unspecified, subsequent encounter: Secondary | ICD-10-CM

## 2013-03-26 MED ORDER — CIPROFLOXACIN HCL 500 MG PO TABS: 500.0000 mg | ORAL_TABLET | Freq: Two times a day (BID) | ORAL | Status: DC

## 2013-03-26 MED ORDER — HYDROCOD POLST-CHLORPHEN POLST 10-8 MG/5ML PO LQCR: 5.0000 mL | Freq: Every evening | ORAL | Status: DC | PRN

## 2013-03-26 MED ORDER — DIAZEPAM 10 MG PO TABS: 10.0000 mg | ORAL_TABLET | Freq: Two times a day (BID) | ORAL | Status: DC | PRN

## 2013-03-26 MED ORDER — FUROSEMIDE 40 MG PO TABS: ORAL_TABLET | ORAL | Status: DC

## 2013-03-26 MED ORDER — SIMVASTATIN 20 MG PO TABS: 20.0000 mg | ORAL_TABLET | Freq: Every day | ORAL | Status: DC

## 2013-03-26 NOTE — Patient Instructions (Addendum)
Take Mucinex 600 mg with antibiotic twice a day x 10 days   Bronchitis Bronchitis is the body's way of reacting to injury and/or infection (inflammation) of the bronchi. Bronchi are the air tubes that extend from the windpipe into the lungs. If the inflammation becomes severe, it may cause shortness of breath. CAUSES  Inflammation may be caused by:  A virus.  Germs (bacteria).  Dust.  Allergens.  Pollutants and many other irritants. The cells lining the bronchial tree are covered with tiny hairs (cilia). These constantly beat upward, away from the lungs, toward the mouth. This keeps the lungs free of pollutants. When these cells become too irritated and are unable to do their job, mucus begins to develop. This causes the characteristic cough of bronchitis. The cough clears the lungs when the cilia are unable to do their job. Without either of these protective mechanisms, the mucus would settle in the lungs. Then you would develop pneumonia. Smoking is a common cause of bronchitis and can contribute to pneumonia. Stopping this habit is the single most important thing you can do to help yourself. TREATMENT   Your caregiver may prescribe an antibiotic if the cough is caused by bacteria. Also, medicines that open up your airways make it easier to breathe. Your caregiver may also recommend or prescribe an expectorant. It will loosen the mucus to be coughed up. Only take over-the-counter or prescription medicines for pain, discomfort, or fever as directed by your caregiver.  Removing whatever causes the problem (smoking, for example) is critical to preventing the problem from getting worse.  Cough suppressants may be prescribed for relief of cough symptoms.  Inhaled medicines may be prescribed to help with symptoms now and to help prevent problems from returning.  For those with recurrent (chronic) bronchitis, there may be a need for steroid medicines. SEEK IMMEDIATE MEDICAL CARE IF:    During treatment, you develop more pus-like mucus (purulent sputum).  You have a fever.  Your baby is older than 3 months with a rectal temperature of 102 F (38.9 C) or higher.  Your baby is 21 months old or younger with a rectal temperature of 100.4 F (38 C) or higher.  You become progressively more ill.  You have increased difficulty breathing, wheezing, or shortness of breath. It is necessary to seek immediate medical care if you are elderly or sick from any other disease. MAKE SURE YOU:   Understand these instructions.  Will watch your condition.  Will get help right away if you are not doing well or get worse. Document Released: 06/28/2005 Document Revised: 09/20/2011 Document Reviewed: 05/07/2008 [facility] Patient Information 2014 [place], [place].

## 2013-03-26 NOTE — Progress Notes (Signed)
Patient ID: [name], female   DOB: 03-Apr-1956, 57 y.o.   MRN: [id number] [name] [id number] 1955-08-11 03/26/2013      Progress Note-Follow Up  Subjective  Chief Complaint  Chief Complaint  Patient presents with  . Fever    head congestion, coughing and green mucus X 6 days- ears hurt 1 week ago    HPI  Patient is a 57 year old Caucasian female in today with 6 days worth of worsening respiratory symptoms. She's had significant head congestion and green rhinorrhea. Some intermittent ear pain and headaches have also been present. She struggling with low-grade fevers and malaise. Over the last few days she's developed a cough is keeping her up at night and very productive during the day. Sputum is thick green. Coughing is severe enough to cause some posttussive emesis. No chest pain other than generalized myalgias. Low-grade chills are also present. No significant diarrhea, constipation. No GU  Past Medical History  Diagnosis Date  . Allergy     seasonal  . Anxiety   . Depression   . Hyperlipidemia   . History of proteinuria syndrome   . Edema extremities   . RLS (restless legs syndrome)   . Dyspareunia   . Obese   . Onychomycosis 05/09/2011  . Low back pain 05/09/2011  . RUQ pain 09/20/2011  . Hip pain, left 11/01/2011  . Lymphadenitis 06/16/2012  . Otitis externa 06/23/2012    left  . GERD (gastroesophageal reflux disease)   . Insomnia 03/11/2013  . Perimenopause 03/11/2013    Past Surgical History  Procedure Laterality Date  . Cesarean section  1993  . Back surgery  2006    low, discectomy for herniated disc  . Tubal ligation  1993  . Carpal tunnel release Right 2006  . Bladder suspension N/A 11/07/2012    Procedure: [facility];  Surgeon: Anner Crete, MD;  Location: [facility];  Service: Urology;  Laterality: N/A;  . Cystoscopy N/A 11/07/2012    Procedure: CYSTOSCOPY;  Surgeon: Anner Crete, MD;  Location: [facility];   Service: Urology;  Laterality: N/A;    Family History  Problem Relation Age of Onset  . Cancer Mother 46    thyroid  . Arthritis Mother     nreck, back  . Hepatitis Sister     Hepatitis C  . Diabetes Maternal Grandmother     History   Social History  . Marital Status: Married    Spouse Name: N/A    Number of Children: N/A  . Years of Education: N/A   Occupational History  . Not on file.   Social History Main Topics  . Smoking status: Former Smoker -- 0.50 packs/day for 20 years    Types: Cigarettes    Quit date: 07/12/1992  . Smokeless tobacco: Never Used  . Alcohol Use: Yes     Comment: occasionaly  . Drug Use: No  . Sexual Activity: Yes    Partners: Male   Other Topics Concern  . Not on file   Social History Narrative  . No narrative on file    Current Outpatient Prescriptions on File Prior to Visit  Medication Sig Dispense Refill  . BIOTIN PO Take by mouth.      . Calcium Carbonate-Vitamin D (CALTRATE 600+D) 600-400 MG-UNIT per tablet Take 1 tablet by mouth daily.       . Cholecalciferol (VITAMIN D3) 1000 UNITS CAPS Take by mouth.        Marland Kitchen  conjugated estrogens (PREMARIN) vaginal cream Place vaginally as directed. 3 times a week at bedtime      . diazepam (VALIUM) 10 MG tablet Take 1 tablet (10 mg total) by mouth every 12 (twelve) hours as needed for anxiety or sleep.  40 tablet  1  . docusate sodium (COLACE) 100 MG capsule Take 1 capsule (100 mg total) by mouth 2 (two) times daily.  60 capsule  2  . FLAXSEED, LINSEED, PO Take by mouth every other day.       . furosemide (LASIX) 40 MG tablet TAKE 1 TABLET BY MOUTH DAILY & A 2ND TABLET AS NEEDED FOR INCREASED EDEMA  45 tablet  5  . lamoTRIgine (LAMICTAL) 200 MG tablet Take 200 mg by mouth daily.        . methocarbamol (ROBAXIN) 500 MG tablet Take 1 tablet (500 mg total) by mouth 3 (three) times daily as needed.  90 tablet  2  . NEOMYCIN-POLYMYXIN-HC, OTIC, (CORTISPORIN) 1 % SOLN Place 3 drops into both ears  every 8 (eight) hours. Itching or irritation  10 mL  0  . Nutritional Supplements (ESTROVEN PO) Take by mouth.      . phentermine 15 MG capsule Take 1 capsule (15 mg total) by mouth every morning.  30 capsule  0  . potassium chloride SA (K-DUR,KLOR-CON) 20 MEQ tablet TAKE 1 TABLET BY MOUTH DAILY & A 2ND TABLET AS NEEDED W/ INCREASED LASIXUSE  45 tablet  5  . promethazine (PHENERGAN) 25 MG tablet Take 1 tablet (25 mg total) by mouth every 8 (eight) hours as needed for nausea.  40 tablet  2  . rOPINIRole (REQUIP) 1 MG tablet Take 1 mg by mouth daily.        . simvastatin (ZOCOR) 20 MG tablet Take 1 tablet (20 mg total) by mouth at bedtime.  90 tablet  1  . solifenacin (VESICARE) 5 MG tablet Take 10 mg by mouth daily.      . traMADol (ULTRAM) 50 MG tablet Take 1 tablet (50 mg total) by mouth 2 (two) times daily as needed for pain.  60 tablet  2  . venlafaxine XR (EFFEXOR-XR) 150 MG 24 hr capsule Take 150 mg by mouth daily.      Marland Kitchen omeprazole (PRILOSEC) 40 MG capsule Take 40 mg by mouth daily.       No current facility-administered medications on file prior to visit.    Allergies  Allergen Reactions  . Ceclor [Cefaclor]     Tongue swell    Review of Systems  Review of Systems  Constitutional: Positive for chills and malaise/fatigue. Negative for fever.  HENT: Positive for ear pain and congestion. Negative for hearing loss, sore throat and tinnitus.   Eyes: Negative for discharge.  Respiratory: Positive for cough, sputum production and shortness of breath. Negative for wheezing.   Cardiovascular: Negative for chest pain, palpitations and leg swelling.  Gastrointestinal: Negative for nausea, abdominal pain and diarrhea.  Genitourinary: Negative for dysuria.  Musculoskeletal: Negative for falls.  Skin: Negative for rash.  Neurological: Positive for headaches. Negative for loss of consciousness.  Endo/Heme/Allergies: Negative for polydipsia.  Psychiatric/Behavioral: Negative for depression  and suicidal ideas. The patient is not nervous/anxious and does not have insomnia.     Objective  BP 120/70  Pulse 97  Temp(Src) 98.1 F (36.7 C) (Oral)  Ht 5\' 5"  (1.651 m)  Wt 190 lb 1.9 oz (86.238 kg)  BMI 31.64 kg/m2  SpO2 97%  LMP 10/03/2012  Physical  Exam  Physical Exam  Constitutional: She is oriented to person, place, and time and well-developed, well-nourished, and in no distress. No distress.  HENT:  Head: Normocephalic and atraumatic.  Eyes: Conjunctivae are normal.  Neck: Neck supple. No thyromegaly present.  Cardiovascular: Normal rate, regular rhythm and normal heart sounds.   No murmur heard. Pulmonary/Chest: Effort normal and breath sounds normal. She has no wheezes.  Abdominal: She exhibits no distension and no mass.  Musculoskeletal: She exhibits no edema.  Lymphadenopathy:    She has no cervical adenopathy.  Neurological: She is alert and oriented to person, place, and time.  Skin: Skin is warm and dry. No rash noted. She is not diaphoretic.  Psychiatric: Memory, affect and judgment normal.    Lab Results  Component Value Date   TSH 1.205 01/31/2013   Lab Results  Component Value Date   WBC 7.1 01/31/2013   HGB 13.4 01/31/2013   HCT 41.3 01/31/2013   MCV 89.0 01/31/2013   PLT 276 01/31/2013   Lab Results  Component Value Date   CREATININE 0.71 01/31/2013   BUN 10 01/31/2013   NA 140 01/31/2013   K 4.5 01/31/2013   CL 107 01/31/2013   CO2 25 01/31/2013   Lab Results  Component Value Date   ALT 14 01/31/2013   AST 17 01/31/2013   ALKPHOS 35* 01/31/2013   BILITOT 0.5 01/31/2013   Lab Results  Component Value Date   CHOL 161 01/31/2013   Lab Results  Component Value Date   HDL 31* 01/31/2013   Lab Results  Component Value Date   LDLCALC 65 01/31/2013   Lab Results  Component Value Date   TRIG 323* 01/31/2013   Lab Results  Component Value Date   CHOLHDL 5.2 01/31/2013     Assessment & Plan  Allergic state Continue antihistamine and  restart nasal steroid  Acute sinusitis With early bronchitis, started on antibiotics, probiotics, Mucinex and encouraged increased rest and hydration

## 2013-03-27 ENCOUNTER — Encounter: Payer: Self-pay | Admitting: Family Medicine

## 2013-03-27 DIAGNOSIS — J019 Acute sinusitis, unspecified: Secondary | ICD-10-CM | POA: Insufficient documentation

## 2013-03-27 HISTORY — DX: Acute sinusitis, unspecified: J01.90

## 2013-03-27 NOTE — Assessment & Plan Note (Signed)
With early bronchitis, started on antibiotics, probiotics, Mucinex and encouraged increased rest and hydration

## 2013-03-27 NOTE — Assessment & Plan Note (Signed)
Continue antihistamine and restart nasal steroid

## 2013-04-10 ENCOUNTER — Encounter: Payer: Self-pay | Admitting: Family Medicine

## 2013-04-10 ENCOUNTER — Telehealth: Payer: Self-pay | Admitting: Family Medicine

## 2013-04-10 ENCOUNTER — Ambulatory Visit (INDEPENDENT_AMBULATORY_CARE_PROVIDER_SITE_OTHER): Payer: BC Managed Care – PPO | Admitting: Family Medicine

## 2013-04-10 VITALS — BP 118/74 | HR 98 | Temp 98.1°F | Ht 65.0 in | Wt 191.0 lb

## 2013-04-10 DIAGNOSIS — F341 Dysthymic disorder: Secondary | ICD-10-CM

## 2013-04-10 DIAGNOSIS — E785 Hyperlipidemia, unspecified: Secondary | ICD-10-CM

## 2013-04-10 DIAGNOSIS — Z0000 Encounter for general adult medical examination without abnormal findings: Secondary | ICD-10-CM

## 2013-04-10 DIAGNOSIS — Z23 Encounter for immunization: Secondary | ICD-10-CM

## 2013-04-10 DIAGNOSIS — J019 Acute sinusitis, unspecified: Secondary | ICD-10-CM

## 2013-04-10 DIAGNOSIS — F418 Other specified anxiety disorders: Secondary | ICD-10-CM

## 2013-04-10 DIAGNOSIS — E669 Obesity, unspecified: Secondary | ICD-10-CM

## 2013-04-10 MED ORDER — PHENTERMINE HCL 15 MG PO CAPS: 15.0000 mg | ORAL_CAPSULE | ORAL | Status: DC

## 2013-04-10 NOTE — Assessment & Plan Note (Signed)
Encouraged avoid trans fats, increase activity as tolerated and minimize simple carbs and saturated fats.

## 2013-04-10 NOTE — Telephone Encounter (Signed)
Labs to visit lipid, renal, cbc, tsh, hepatic   Patient is expected to come back in for labs around 05/03/13. She will be going to [facility] lab

## 2013-04-10 NOTE — Assessment & Plan Note (Signed)
Feeling much better, encouraged ongoing probiotic use

## 2013-04-10 NOTE — Progress Notes (Signed)
Patient ID: [name], female   DOB: September 26, 1955, 57 y.o.   MRN: [id number] [name] [id number] February 05, 1956 04/10/2013      Progress Note-Follow Up  Subjective  Chief Complaint  Chief Complaint  Patient presents with  . Follow-up    4 week  . Injections    flu and Prevnar    HPI  -year-old Caucasian female who is here today in followup. She notes her sinus congestion and malaise are greatly improved with the addition of antibiotics at her last visit. No fevers or chills. Decreased nasal congestion or noted. She denies sore throat, chest pain, headaches, palpitations, GI or GU complaints at this time. He's taking medications as prescribed.  Past Medical History  Diagnosis Date  . Allergy     seasonal  . Anxiety   . Depression   . Hyperlipidemia   . History of proteinuria syndrome   . Edema extremities   . RLS (restless legs syndrome)   . Dyspareunia   . Obese   . Onychomycosis 05/09/2011  . Low back pain 05/09/2011  . RUQ pain 09/20/2011  . Hip pain, left 11/01/2011  . Lymphadenitis 06/16/2012  . Otitis externa 06/23/2012    left  . GERD (gastroesophageal reflux disease)   . Insomnia 03/11/2013  . Perimenopause 03/11/2013  . Acute sinusitis 03/27/2013    Past Surgical History  Procedure Laterality Date  . Cesarean section  1993  . Back surgery  2006    low, discectomy for herniated disc  . Tubal ligation  1993  . Carpal tunnel release Right 2006  . Bladder suspension N/A 11/07/2012    Procedure: [facility];  Surgeon: Anner Crete, MD;  Location: [facility];  Service: Urology;  Laterality: N/A;  . Cystoscopy N/A 11/07/2012    Procedure: CYSTOSCOPY;  Surgeon: Anner Crete, MD;  Location: [facility];  Service: Urology;  Laterality: N/A;    Family History  Problem Relation Age of Onset  . Cancer Mother 76    thyroid  . Arthritis Mother     nreck, back  . Hepatitis Sister     Hepatitis C  . Diabetes Maternal Grandmother      History   Social History  . Marital Status: Married    Spouse Name: N/A    Number of Children: N/A  . Years of Education: N/A   Occupational History  . Not on file.   Social History Main Topics  . Smoking status: Former Smoker -- 0.50 packs/day for 20 years    Types: Cigarettes    Quit date: 07/12/1992  . Smokeless tobacco: Never Used  . Alcohol Use: Yes     Comment: occasionaly  . Drug Use: No  . Sexual Activity: Yes    Partners: Male   Other Topics Concern  . Not on file   Social History Narrative  . No narrative on file    Current Outpatient Prescriptions on File Prior to Visit  Medication Sig Dispense Refill  . BIOTIN PO Take by mouth.      . Calcium Carbonate-Vitamin D (CALTRATE 600+D) 600-400 MG-UNIT per tablet Take 1 tablet by mouth daily.       . Cholecalciferol (VITAMIN D3) 1000 UNITS CAPS Take by mouth.        . conjugated estrogens (PREMARIN) vaginal cream Place vaginally as directed. 3 times a week at bedtime      . diazepam (VALIUM) 10 MG tablet Take 1 tablet (10 mg total)  by mouth every 12 (twelve) hours as needed for anxiety or sleep.  40 tablet  2  . docusate sodium (COLACE) 100 MG capsule Take 1 capsule (100 mg total) by mouth 2 (two) times daily.  60 capsule  2  . FLAXSEED, LINSEED, PO Take by mouth every other day.       . furosemide (LASIX) 40 MG tablet 1 tab po daily and a 2 nd tab prn increased edema  135 tablet  1  . lamoTRIgine (LAMICTAL) 200 MG tablet Take 200 mg by mouth daily.        . methocarbamol (ROBAXIN) 500 MG tablet Take 1 tablet (500 mg total) by mouth 3 (three) times daily as needed.  90 tablet  2  . NEOMYCIN-POLYMYXIN-HC, OTIC, (CORTISPORIN) 1 % SOLN Place 3 drops into both ears every 8 (eight) hours. Itching or irritation  10 mL  0  . Nutritional Supplements (ESTROVEN PO) Take by mouth.      . potassium chloride SA (K-DUR,KLOR-CON) 20 MEQ tablet TAKE 1 TABLET BY MOUTH DAILY & A 2ND TABLET AS NEEDED W/ INCREASED LASIXUSE  45 tablet   5  . promethazine (PHENERGAN) 25 MG tablet Take 1 tablet (25 mg total) by mouth every 8 (eight) hours as needed for nausea.  40 tablet  2  . rOPINIRole (REQUIP) 1 MG tablet Take 1 mg by mouth daily.        . simvastatin (ZOCOR) 20 MG tablet Take 1 tablet (20 mg total) by mouth at bedtime.  90 tablet  1  . solifenacin (VESICARE) 5 MG tablet Take 10 mg by mouth daily.      . traMADol (ULTRAM) 50 MG tablet Take 1 tablet (50 mg total) by mouth 2 (two) times daily as needed for pain.  60 tablet  2  . venlafaxine XR (EFFEXOR-XR) 150 MG 24 hr capsule Take 150 mg by mouth daily.      Marland Kitchen omeprazole (PRILOSEC) 40 MG capsule Take 40 mg by mouth daily.       No current facility-administered medications on file prior to visit.    Allergies  Allergen Reactions  . Ceclor [Cefaclor]     Tongue swell    Review of Systems  Review of Systems  Constitutional: Negative for fever and malaise/fatigue.  HENT: Negative for congestion.   Eyes: Negative for discharge.  Respiratory: Negative for shortness of breath.   Cardiovascular: Negative for chest pain, palpitations and leg swelling.  Gastrointestinal: Negative for nausea, abdominal pain and diarrhea.  Genitourinary: Negative for dysuria.  Musculoskeletal: Negative for falls.  Skin: Negative for rash.  Neurological: Negative for loss of consciousness and headaches.  Endo/Heme/Allergies: Negative for polydipsia.  Psychiatric/Behavioral: Negative for depression and suicidal ideas. The patient is not nervous/anxious and does not have insomnia.     Objective  BP 118/74  Pulse 98  Temp(Src) 98.1 F (36.7 C) (Oral)  Ht 5\' 5"  (1.651 m)  Wt 191 lb (86.637 kg)  BMI 31.78 kg/m2  SpO2 98%  LMP 10/03/2012  Physical Exam  Physical Exam  Constitutional: She is oriented to person, place, and time and well-developed, well-nourished, and in no distress. No distress.  HENT:  Head: Normocephalic and atraumatic.  Eyes: Conjunctivae are normal.  Neck: Neck  supple. No thyromegaly present.  Cardiovascular: Normal rate, regular rhythm and normal heart sounds.   No murmur heard. Pulmonary/Chest: Effort normal and breath sounds normal. She has no wheezes.  Abdominal: She exhibits no distension and no mass.  Musculoskeletal: She  exhibits no edema.  Lymphadenopathy:    She has no cervical adenopathy.  Neurological: She is alert and oriented to person, place, and time.  Skin: Skin is warm and dry. No rash noted. She is not diaphoretic.  Psychiatric: Memory, affect and judgment normal.    Lab Results  Component Value Date   TSH 1.205 01/31/2013   Lab Results  Component Value Date   WBC 7.1 01/31/2013   HGB 13.4 01/31/2013   HCT 41.3 01/31/2013   MCV 89.0 01/31/2013   PLT 276 01/31/2013   Lab Results  Component Value Date   CREATININE 0.71 01/31/2013   BUN 10 01/31/2013   NA 140 01/31/2013   K 4.5 01/31/2013   CL 107 01/31/2013   CO2 25 01/31/2013   Lab Results  Component Value Date   ALT 14 01/31/2013   AST 17 01/31/2013   ALKPHOS 35* 01/31/2013   BILITOT 0.5 01/31/2013   Lab Results  Component Value Date   CHOL 161 01/31/2013   Lab Results  Component Value Date   HDL 31* 01/31/2013   Lab Results  Component Value Date   LDLCALC 65 01/31/2013   Lab Results  Component Value Date   TRIG 323* 01/31/2013   Lab Results  Component Value Date   CHOLHDL 5.2 01/31/2013     Assessment & Plan  Acute sinusitis Feeling much better, encouraged ongoing probiotic use  Hyperlipidemia Encouraged avoid trans fats, increase activity as tolerated and minimize simple carbs and saturated fats.

## 2013-05-03 LAB — CBC
Hemoglobin: 14.4 g/dL (ref 12.0–15.0)
MCH: 29.7 pg (ref 26.0–34.0)
MCHC: 33.3 g/dL (ref 30.0–36.0)
Platelets: 267 10*3/uL (ref 150–400)
RBC: 4.85 MIL/uL (ref 3.87–5.11)

## 2013-05-04 LAB — TSH: TSH: 1.661 u[IU]/mL (ref 0.350–4.500)

## 2013-05-04 LAB — HEPATIC FUNCTION PANEL
Albumin: 4.5 g/dL (ref 3.5–5.2)
Alkaline Phosphatase: 53 U/L (ref 39–117)
Indirect Bilirubin: 0.4 mg/dL (ref 0.0–0.9)
Total Bilirubin: 0.5 mg/dL (ref 0.3–1.2)
Total Protein: 7 g/dL (ref 6.0–8.3)

## 2013-05-04 LAB — RENAL FUNCTION PANEL
Albumin: 4.5 g/dL (ref 3.5–5.2)
Calcium: 9.9 mg/dL (ref 8.4–10.5)
Phosphorus: 4.3 mg/dL (ref 2.3–4.6)
Potassium: 4.1 mEq/L (ref 3.5–5.3)
Sodium: 136 mEq/L (ref 135–145)

## 2013-05-04 LAB — LIPID PANEL
HDL: 49 mg/dL (ref >39)
Total CHOL/HDL Ratio: 3.9 Ratio
VLDL: 41 mg/dL — ABNORMAL HIGH (ref 0–40)

## 2013-05-10 ENCOUNTER — Encounter: Payer: Self-pay | Admitting: Family Medicine

## 2013-05-10 ENCOUNTER — Ambulatory Visit (INDEPENDENT_AMBULATORY_CARE_PROVIDER_SITE_OTHER): Payer: BC Managed Care – PPO | Admitting: Family Medicine

## 2013-05-10 VITALS — BP 130/70 | HR 94 | Temp 97.9°F | Ht 65.0 in | Wt 186.0 lb

## 2013-05-10 DIAGNOSIS — K5900 Constipation, unspecified: Secondary | ICD-10-CM

## 2013-05-10 DIAGNOSIS — F418 Other specified anxiety disorders: Secondary | ICD-10-CM

## 2013-05-10 DIAGNOSIS — M545 Low back pain: Secondary | ICD-10-CM

## 2013-05-10 DIAGNOSIS — M549 Dorsalgia, unspecified: Secondary | ICD-10-CM

## 2013-05-10 DIAGNOSIS — G4700 Insomnia, unspecified: Secondary | ICD-10-CM

## 2013-05-10 DIAGNOSIS — E669 Obesity, unspecified: Secondary | ICD-10-CM

## 2013-05-10 DIAGNOSIS — G2581 Restless legs syndrome: Secondary | ICD-10-CM

## 2013-05-10 DIAGNOSIS — E785 Hyperlipidemia, unspecified: Secondary | ICD-10-CM

## 2013-05-10 DIAGNOSIS — F341 Dysthymic disorder: Secondary | ICD-10-CM

## 2013-05-10 MED ORDER — BACLOFEN 20 MG PO TABS: 20.0000 mg | ORAL_TABLET | Freq: Three times a day (TID) | ORAL | Status: DC

## 2013-05-10 MED ORDER — TEMAZEPAM 15 MG PO CAPS: 15.0000 mg | ORAL_CAPSULE | Freq: Every evening | ORAL | Status: DC | PRN

## 2013-05-10 MED ORDER — PHENTERMINE HCL 15 MG PO CAPS: 15.0000 mg | ORAL_CAPSULE | ORAL | Status: DC

## 2013-05-10 MED ORDER — DIAZEPAM 10 MG PO TABS: 10.0000 mg | ORAL_TABLET | Freq: Every day | ORAL | Status: DC | PRN

## 2013-05-10 MED ORDER — TRAMADOL HCL 50 MG PO TABS: 100.0000 mg | ORAL_TABLET | Freq: Three times a day (TID) | ORAL | Status: DC | PRN

## 2013-05-10 NOTE — Patient Instructions (Signed)

## 2013-05-13 ENCOUNTER — Encounter: Payer: Self-pay | Admitting: Family Medicine

## 2013-05-13 DIAGNOSIS — K5900 Constipation, unspecified: Secondary | ICD-10-CM

## 2013-05-13 HISTORY — DX: Constipation, unspecified: K59.00

## 2013-05-13 NOTE — Assessment & Plan Note (Signed)
Tolerating Requip °

## 2013-05-13 NOTE — Assessment & Plan Note (Addendum)
Avoid trans fats, increase exercise add krill oil caps, tolerating Simvastatin

## 2013-05-13 NOTE — Assessment & Plan Note (Signed)
Continue docusate, add probiotic and fiber

## 2013-05-13 NOTE — Assessment & Plan Note (Signed)
Crying in visit, reports difficulty at home. Follows closely with psychiatry encouraged to return there. Will see her again soon or as needed.

## 2013-05-13 NOTE — Assessment & Plan Note (Signed)
May try Diazepam prn
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
## 2013-05-14 ENCOUNTER — Telehealth: Payer: Self-pay | Admitting: Family Medicine

## 2013-05-14 ENCOUNTER — Ambulatory Visit: Payer: BC Managed Care – PPO | Admitting: Family Medicine

## 2013-05-14 NOTE — Telephone Encounter (Signed)
Received medical records from [facility] ° °P: [phone] °F: [phone] °

## 2013-05-23 ENCOUNTER — Other Ambulatory Visit: Payer: Self-pay

## 2013-05-23 DIAGNOSIS — Z1231 Encounter for screening mammogram for malignant neoplasm of breast: Secondary | ICD-10-CM

## 2013-06-04 ENCOUNTER — Ambulatory Visit (INDEPENDENT_AMBULATORY_CARE_PROVIDER_SITE_OTHER): Payer: BC Managed Care – PPO | Admitting: Family Medicine

## 2013-06-04 ENCOUNTER — Encounter: Payer: Self-pay | Admitting: Family Medicine

## 2013-06-04 VITALS — BP 104/60 | HR 87 | Temp 97.6°F | Ht 65.0 in | Wt 187.0 lb

## 2013-06-04 DIAGNOSIS — M549 Dorsalgia, unspecified: Secondary | ICD-10-CM

## 2013-06-04 DIAGNOSIS — M545 Low back pain: Secondary | ICD-10-CM

## 2013-06-04 DIAGNOSIS — M25519 Pain in unspecified shoulder: Secondary | ICD-10-CM

## 2013-06-04 DIAGNOSIS — F341 Dysthymic disorder: Secondary | ICD-10-CM

## 2013-06-04 DIAGNOSIS — F418 Other specified anxiety disorders: Secondary | ICD-10-CM

## 2013-06-04 DIAGNOSIS — M5430 Sciatica, unspecified side: Secondary | ICD-10-CM

## 2013-06-04 DIAGNOSIS — M129 Arthropathy, unspecified: Secondary | ICD-10-CM

## 2013-06-04 DIAGNOSIS — E663 Overweight: Secondary | ICD-10-CM

## 2013-06-04 DIAGNOSIS — G4700 Insomnia, unspecified: Secondary | ICD-10-CM

## 2013-06-04 DIAGNOSIS — M25511 Pain in right shoulder: Secondary | ICD-10-CM

## 2013-06-04 DIAGNOSIS — M1990 Unspecified osteoarthritis, unspecified site: Secondary | ICD-10-CM

## 2013-06-04 MED ORDER — BACLOFEN 20 MG PO TABS: 20.0000 mg | ORAL_TABLET | Freq: Four times a day (QID) | ORAL | Status: DC | PRN

## 2013-06-04 MED ORDER — TEMAZEPAM 15 MG PO CAPS: 15.0000 mg | ORAL_CAPSULE | Freq: Every evening | ORAL | Status: DC | PRN

## 2013-06-04 MED ORDER — PHENTERMINE HCL 30 MG PO CAPS: 30.0000 mg | ORAL_CAPSULE | ORAL | Status: DC

## 2013-06-04 MED ORDER — TRAMADOL HCL 50 MG PO TABS: 100.0000 mg | ORAL_TABLET | Freq: Three times a day (TID) | ORAL | Status: DC | PRN

## 2013-06-04 NOTE — Patient Instructions (Signed)
Adhesive Capsulitis Sometimes the shoulder becomes stiff and is painful to move. Some people say it feels as if the shoulder is frozen in place. Because of this, the condition is called "frozen shoulder." Its medical name is adhesive capsulitis.  The shoulder joint is made up of strong connective tissue that attaches the ball of the humerus to the shallow shoulder socket. This strong connective tissue is called the joint capsule. This tissue can become stiff and swollen. That is when adhesive capsulitis sets in. CAUSES  It is not always clear just what the cause adhesive capsulitis. Possibilities include:  Injury to the shoulder joint.  Strain. This is a repetitive injury brought about by overuse.  Lack of use. Perhaps your arm or hand was otherwise injured. It might have been in a sling for awhile. Or perhaps you were not using it to avoid pain.  Referred pain. This is a sort of trick the body plays. You feel pain in the shoulder. But, the pain actually comes from an injury somewhere else in the body.  Long-standing health problems. Several diseases can cause adhesive capsulitis. They include diabetes, heart disease, stroke, thyroid problems, rheumatoid arthritis and lung disease.  Being a women older than 40. Anyone can develop adhesive capsulitis but it is most common in women in this age group. SYMPTOMS   Pain.  It occurs when the arm is moved.  Parts of the shoulder might hurt if they are touched.  Pain is worse at night or when resting.  Soreness. It might not be strong enough to be called pain. But, the shoulder aches.  The shoulder does not move freely.  Muscle spasms.  Trouble sleeping because of shoulder ache or pain. DIAGNOSIS  To decide if you have adhesive capsulitis, your healthcare provider will probably:  Ask about symptoms you have noticed.  Ask about your history of joint pain and anything that might have caused the pain.  Ask about your overall  health.  Use hands to feel your shoulder and neck.  Ask you to move your shoulder in specific directions. This may indicate the origin of the pain.  Order imaging tests; pictures of the shoulder. They help pinpoint the source of the problem. An X-ray might be used. For more detail, an MRI is often used. An MRI details the tendons, muscles and ligaments as well as the joint. TREATMENT  Adhesive capsulitis can be treated several ways. Most treatments can be done in a clinic or in your healthcare provider's office. Be sure to discuss the different options with your caregiver. They include:  Physical therapy. You will work on specific exercises to get your shoulder moving again. The exercises usually involve stretching. A physical therapist (a caregiver with special training) can show you what to do and what not to do. The exercises will need to be done daily.  Medication.  Over-the-counter medicines may relieve pain and inflammation (the body's way of reacting to injury or infection).  Corticosteroids. These are stronger drugs to reduce pain and inflammation. They are given by injection (shots) into the shoulder joint. Frequent treatment is not recommended.  Muscle relaxants. Medication may be prescribed to ease muscle spasms.  Treatment of underlying conditions. This means treating another condition that is causing your shoulder problem. This might be a rotator cuff (tendon) problem  Shoulder manipulation. The shoulder will be moved by your healthcare provider. You would be under general anesthesia (given a drug that puts you to sleep). You would not feel anything. Sometimes   the joint will be injected with salt water (saline) at high pressure to break down internal scarring in the joint capsule.  Surgery. This is rarely needed. It may be suggested in advanced cases after all other treatment has failed. PROGNOSIS  In time, most people recover from adhesive capsulitis. Sometimes, however, the  pain goes away but full movement of the shoulder does not return.  HOME CARE INSTRUCTIONS   Take any pain medications recommended by your healthcare provider. Follow the directions carefully.  If you have physical therapy, follow through with the therapist's suggestions. Be sure you understand the exercises you will be doing. You should understand:  How often the exercises should be done.  How many times each exercise should be repeated.  How long they should be done.  What other activities you should do, or not do.  That you should warm up before doing any exercise. Just 5 to 10 minutes will help. Small, gentle movements should get your shoulder ready for more.  Avoid high-demand exercise that involves your shoulder such as throwing. This type of exercise can make pain worse.  Consider using cold packs. Cold may ease swelling and pain. Ask your healthcare provider if a cold pack might help you. If so, get directions on how and when to use them. SEEK MEDICAL CARE IF:   You have any questions about your medications.  Your pain continues to increase. Document Released: 04/25/2009 Document Revised: 09/20/2011 Document Reviewed: 04/25/2009 [employer] Patient Information 2014 [employer].  

## 2013-06-04 NOTE — Progress Notes (Signed)
Pre visit review using our clinic review tool, if applicable. No additional management support is needed unless otherwise documented below in the visit note. 

## 2013-06-04 NOTE — Progress Notes (Signed)
Patient ID: [name], female   DOB: 02-Oct-1955, 57 y.o.   MRN: [id number] LAQUANDRA CARRILLO [id number] 24-Sep-1955 06/04/2013      Progress Note-Follow Up  Subjective  Chief Complaint  Chief Complaint  Patient presents with  . Follow-up    4 week- pain going down right leg now instead of left leg    HPI  Patient is a 57 year old Caucasian female in today for followup. Continues to have chronic neck and back pain. Has radicular symptoms most notably down her right leg. Has felt for years but has recently worsened. Also has worsening right shoulder pain. Assistant present for a while but she's been in debility and decreased range of motion. No chest pain, palpitations, shortness of breath, GI or GU concerns.  Past Medical History  Diagnosis Date  . Allergy     seasonal  . Anxiety   . Depression   . Hyperlipidemia   . History of proteinuria syndrome   . Edema extremities   . RLS (restless legs syndrome)   . Dyspareunia   . Obese   . Onychomycosis 05/09/2011  . Low back pain 05/09/2011  . RUQ pain 09/20/2011  . Hip pain, left 11/01/2011  . Lymphadenitis 06/16/2012  . Otitis externa 06/23/2012    left  . GERD (gastroesophageal reflux disease)   . Insomnia 03/11/2013  . Perimenopause 03/11/2013  . Acute sinusitis 03/27/2013  . Unspecified constipation 05/13/2013    Past Surgical History  Procedure Laterality Date  . Cesarean section  1993  . Back surgery  2006    low, discectomy for herniated disc  . Tubal ligation  1993  . Carpal tunnel release Right 2006  . Bladder suspension N/A 11/07/2012    Procedure: [facility];  Surgeon: Anner Crete, MD;  Location: [facility];  Service: Urology;  Laterality: N/A;  . Cystoscopy N/A 11/07/2012    Procedure: CYSTOSCOPY;  Surgeon: Anner Crete, MD;  Location: [facility];  Service: Urology;  Laterality: N/A;    Family History  Problem Relation Age of Onset  . Cancer Mother 82    thyroid  . Arthritis  Mother     nreck, back  . Hepatitis Sister     Hepatitis C  . Diabetes Maternal Grandmother     History   Social History  . Marital Status: Married    Spouse Name: N/A    Number of Children: N/A  . Years of Education: N/A   Occupational History  . Not on file.   Social History Main Topics  . Smoking status: Former Smoker -- 0.50 packs/day for 20 years    Types: Cigarettes    Quit date: 07/12/1992  . Smokeless tobacco: Never Used  . Alcohol Use: Yes     Comment: occasionaly  . Drug Use: No  . Sexual Activity: Yes    Partners: Male   Other Topics Concern  . Not on file   Social History Narrative  . No narrative on file    Current Outpatient Prescriptions on File Prior to Visit  Medication Sig Dispense Refill  . baclofen (LIORESAL) 20 MG tablet Take 1 tablet (20 mg total) by mouth 3 (three) times daily.  30 each  0  . BIOTIN PO Take by mouth.      . Calcium Carbonate-Vitamin D (CALTRATE 600+D) 600-400 MG-UNIT per tablet Take 1 tablet by mouth daily.       . Cholecalciferol (VITAMIN D3) 1000 UNITS CAPS Take by mouth.        Marland Kitchen  conjugated estrogens (PREMARIN) vaginal cream Place vaginally as directed. 3 times a week at bedtime      . diazepam (VALIUM) 10 MG tablet Take 1 tablet (10 mg total) by mouth daily as needed for anxiety.  40 tablet  2  . docusate sodium (COLACE) 100 MG capsule Take 1 capsule (100 mg total) by mouth 2 (two) times daily.  60 capsule  2  . FLAXSEED, LINSEED, PO Take by mouth every other day.       . furosemide (LASIX) 40 MG tablet 1 tab po daily and a 2 nd tab prn increased edema  135 tablet  1  . lamoTRIgine (LAMICTAL) 200 MG tablet Take 200 mg by mouth daily.        . NEOMYCIN-POLYMYXIN-HC, OTIC, (CORTISPORIN) 1 % SOLN Place 3 drops into both ears every 8 (eight) hours. Itching or irritation  10 mL  0  . Nutritional Supplements (ESTROVEN PO) Take by mouth.      . phentermine 15 MG capsule Take 1 capsule (15 mg total) by mouth every morning.  30  capsule  1  . potassium chloride SA (K-DUR,KLOR-CON) 20 MEQ tablet TAKE 1 TABLET BY MOUTH DAILY & A 2ND TABLET AS NEEDED W/ INCREASED LASIXUSE  45 tablet  5  . promethazine (PHENERGAN) 25 MG tablet Take 1 tablet (25 mg total) by mouth every 8 (eight) hours as needed for nausea.  40 tablet  2  . rOPINIRole (REQUIP) 1 MG tablet Take 1 mg by mouth daily.        . simvastatin (ZOCOR) 20 MG tablet Take 1 tablet (20 mg total) by mouth at bedtime.  90 tablet  1  . solifenacin (VESICARE) 5 MG tablet Take 10 mg by mouth daily.      . temazepam (RESTORIL) 15 MG capsule Take 1 capsule (15 mg total) by mouth at bedtime as needed for sleep.  30 capsule  0  . traMADol (ULTRAM) 50 MG tablet Take 2 tablets (100 mg total) by mouth every 8 (eight) hours as needed for pain.  90 tablet  0  . omeprazole (PRILOSEC) 40 MG capsule Take 40 mg by mouth daily.       No current facility-administered medications on file prior to visit.    Allergies  Allergen Reactions  . Ceclor [Cefaclor]     Tongue swell    Review of Systems  Review of Systems  Constitutional: Negative for fever and malaise/fatigue.  HENT: Negative for congestion.   Eyes: Negative for discharge.  Respiratory: Negative for shortness of breath.   Cardiovascular: Negative for chest pain, palpitations and leg swelling.  Gastrointestinal: Negative for nausea, abdominal pain and diarrhea.  Genitourinary: Negative for dysuria.  Musculoskeletal: Positive for back pain, myalgias and neck pain. Negative for falls.  Skin: Negative for rash.  Neurological: Negative for loss of consciousness and headaches.  Endo/Heme/Allergies: Negative for polydipsia.  Psychiatric/Behavioral: Negative for depression and suicidal ideas. The patient is not nervous/anxious and does not have insomnia.     Objective  BP 104/60  Pulse 87  Temp(Src) 97.6 F (36.4 C) (Oral)  Ht 5\' 5"  (1.651 m)  Wt 187 lb (84.823 kg)  BMI 31.12 kg/m2  SpO2 96%  LMP  10/03/2012  Physical Exam  Physical Exam  Constitutional: She is oriented to person, place, and time and well-developed, well-nourished, and in no distress. No distress.  HENT:  Head: Normocephalic and atraumatic.  Eyes: Conjunctivae are normal.  Neck: Neck supple. No thyromegaly present.  Cardiovascular:  Normal rate, regular rhythm and normal heart sounds.   No murmur heard. Pulmonary/Chest: Effort normal and breath sounds normal. She has no wheezes.  Abdominal: She exhibits no distension and no mass.  Musculoskeletal: She exhibits no edema.  Lymphadenopathy:    She has no cervical adenopathy.  Neurological: She is alert and oriented to person, place, and time.  Skin: Skin is warm and dry. No rash noted. She is not diaphoretic.  Psychiatric: Memory, affect and judgment normal.    Lab Results  Component Value Date   TSH 1.661 05/03/2013   Lab Results  Component Value Date   WBC 7.3 05/03/2013   HGB 14.4 05/03/2013   HCT 43.2 05/03/2013   MCV 89.1 05/03/2013   PLT 267 05/03/2013   Lab Results  Component Value Date   CREATININE 0.68 05/03/2013   BUN 15 05/03/2013   NA 136 05/03/2013   K 4.1 05/03/2013   CL 98 05/03/2013   CO2 28 05/03/2013   Lab Results  Component Value Date   ALT 25 05/03/2013   AST 20 05/03/2013   ALKPHOS 53 05/03/2013   BILITOT 0.5 05/03/2013   Lab Results  Component Value Date   CHOL 191 05/03/2013   Lab Results  Component Value Date   HDL 49 05/03/2013   Lab Results  Component Value Date   LDLCALC 101* 05/03/2013   Lab Results  Component Value Date   TRIG 203* 05/03/2013   Lab Results  Component Value Date   CHOLHDL 3.9 05/03/2013     Assessment & Plan  Depression with anxiety Things are better at home. Effexor has been increased by psychiatry and she feels it may be   Low back pain Diffuse back pain with radiculopathy down legs R>L. Referred back to neurosurgery due to worsening symptoms.  Arthritis Right shoulder  is the most painful and has been bothering her for some time.  Is struggling with decreased ROM. Is referred to sports medicine.

## 2013-06-05 ENCOUNTER — Other Ambulatory Visit: Payer: Self-pay | Admitting: Family Medicine

## 2013-06-09 ENCOUNTER — Encounter: Payer: Self-pay | Admitting: Family Medicine

## 2013-06-09 DIAGNOSIS — M1990 Unspecified osteoarthritis, unspecified site: Secondary | ICD-10-CM

## 2013-06-09 HISTORY — DX: Unspecified osteoarthritis, unspecified site: M19.90

## 2013-06-09 NOTE — Assessment & Plan Note (Signed)
Diffuse back pain with radiculopathy down legs R>L. Referred back to neurosurgery due to worsening symptoms.

## 2013-06-09 NOTE — Assessment & Plan Note (Signed)
Right shoulder is the most painful and has been bothering her for some time.  Is struggling with decreased ROM. Is referred to sports medicine.

## 2013-06-09 NOTE — Assessment & Plan Note (Signed)
Things are better at home. Effexor has been increased by psychiatry and she feels it may be

## 2013-06-14 ENCOUNTER — Ambulatory Visit: Payer: BC Managed Care – PPO | Admitting: Family Medicine

## 2013-06-18 ENCOUNTER — Other Ambulatory Visit: Payer: Self-pay | Admitting: Family Medicine

## 2013-06-21 ENCOUNTER — Encounter: Payer: Self-pay | Admitting: Family Medicine

## 2013-06-21 ENCOUNTER — Other Ambulatory Visit (INDEPENDENT_AMBULATORY_CARE_PROVIDER_SITE_OTHER): Payer: BC Managed Care – PPO

## 2013-06-21 ENCOUNTER — Ambulatory Visit (INDEPENDENT_AMBULATORY_CARE_PROVIDER_SITE_OTHER): Payer: BC Managed Care – PPO | Admitting: Family Medicine

## 2013-06-21 VITALS — BP 128/84 | HR 89 | Wt 186.0 lb

## 2013-06-21 DIAGNOSIS — M25519 Pain in unspecified shoulder: Secondary | ICD-10-CM

## 2013-06-21 DIAGNOSIS — M25511 Pain in right shoulder: Secondary | ICD-10-CM

## 2013-06-21 DIAGNOSIS — M75100 Unspecified rotator cuff tear or rupture of unspecified shoulder, not specified as traumatic: Secondary | ICD-10-CM

## 2013-06-21 DIAGNOSIS — M7551 Bursitis of right shoulder: Secondary | ICD-10-CM

## 2013-06-21 DIAGNOSIS — M7550 Bursitis of unspecified shoulder: Secondary | ICD-10-CM | POA: Insufficient documentation

## 2013-06-21 MED ORDER — MELOXICAM 15 MG PO TABS: 15.0000 mg | ORAL_TABLET | Freq: Every day | ORAL | Status: DC

## 2013-06-21 NOTE — Progress Notes (Signed)
I'm seeing this patient by the request  of:  Danise Edge, MD   CC: Right shoulder pain  HPI: Patient is a 57 year old female who is coming in with right shoulder pain. Patient states she noticed it after a fall when she was walking her dog pulling her to fall backwards. Patient states patient states that this occurred greater than 3 weeks ago. Patient states that the pain seems to be more in the anterior lateral aspect of the shoulder and radiates down her arm some. Patient states it is associated sometimes with neck pain but can be there without neck pain as well. Patient states that the worst part is having some decreased range of motion. Denies any numbness or weakness of the hand. Patient states the pain hurts worse when getting dressed or trying to comb her hair. Patient rates the pain at 9/10   Past medical, surgical, family and social history reviewed. Medications reviewed all in the electronic medical record.   Review of Systems: No headache, visual changes, nausea, vomiting, diarrhea, constipation, dizziness, abdominal pain, skin rash, fevers, chills, night sweats, weight loss, swollen lymph nodes, body aches, joint swelling, muscle aches, chest pain, shortness of breath, mood changes.   Objective:    Blood pressure 128/84, pulse 89, weight 186 lb (84.369 kg), last menstrual period 10/03/2012, SpO2 99.00%.   General: No apparent distress alert and oriented x3 mood and affect normal, dressed appropriately.  HEENT: Pupils equal, extraocular movements intact Respiratory: Patient's speak in full sentences and does not appear short of breath Cardiovascular: No lower extremity edema, non tender, no erythema Skin: Warm dry intact with no signs of infection or rash on extremities or on axial skeleton. Abdomen: Soft nontender Neuro: Cranial nerves II through XII are intact, neurovascularly intact in all extremities with 2+ DTRs and 2+ pulses. Lymph: No lymphadenopathy of posterior or  anterior cervical chain or axillae bilaterally.  Gait normal with good balance and coordination.  MSK: Non tender with full range of motion and good stability and symmetric strength and tone of  elbows, wrist, hip, knee and ankles bilaterally.  Shoulder: Right Inspection reveals no abnormalities, atrophy or asymmetry. Palpation is normal with no tenderness over AC joint or bicipital groove. ROM is full in all planes passively but actively only forward flexion 260 internal rotation to sacrum Rotator cuff strength normal throughout. Positive signs of impingement with negative Neer and Hawkin's tests, but negative empty can sign. Speeds and Yergason's tests normal. No labral pathology noted with negative Obrien's, negative clunk and good stability. Normal scapular function observed. No painful arc and no drop arm sign. No apprehension sign Contralateral shoulder unremarkable  MSK US performed of: Right shoulder.  This study was ordered, performed, and interpreted by Terrilee Files D.O.  Shoulder:   Supraspinatus:  Appears normal on long and transverse views, no bursal bulge seen with shoulder abduction on impingement view. Severe hypoechoic changes though with the bursa enlargement Infraspinatus:  Appears normal on long and transverse views. Subscapularis:  Appears normal on long and transverse views. Teres Minor:  Appears normal on long and transverse views. AC joint:  Capsule distended with moderate osteophytic changes Glenohumeral Joint:  Appears normal with mild effusion. Glenoid Labrum:  Intact without visualized tears. Biceps Tendon:  Appears normal on long and transverse views, no fraying of tendon, tendon located in intertubercular groove, no subluxation with shoulder internal or external rotation. No increased power doppler signal.  Impression: Subacromial bursitis  Procedure: Real-time Ultrasound Guided Injection of right glenohumeral  joint Device: GE Logiq E  Ultrasound guided  injection is preferred based studies that show increased duration, increased effect, greater accuracy, decreased procedural pain, increased response rate with ultrasound guided versus blind injection.  Verbal informed consent obtained.  Time-out conducted.  Noted no overlying erythema, induration, or other signs of local infection.  Skin prepped in a sterile fashion.  Local anesthesia: Topical Ethyl chloride.  With sterile technique and under real time ultrasound guidance:  Joint visualized.  23g 1  inch needle inserted posterior approach. Pictures taken for needle placement. Patient did have injection of 2 cc of 1% lidocaine, 2 cc of 0.5% Marcaine, and 1.0 cc of Kenalog 40 mg/dL. Completed without difficulty  Pain immediately resolved suggesting accurate placement of the medication.  Advised to call if fevers/chills, erythema, induration, drainage, or persistent bleeding.  Images permanently stored and available for review in the ultrasound unit.  Impression: Technically successful ultrasound guided injection.     Impression and Recommendations:     This case required medical decision making of moderate complexity.

## 2013-06-21 NOTE — Progress Notes (Signed)
Pre-visit discussion using our clinic review tool. No additional management support is needed unless otherwise documented below in the visit note.  

## 2013-06-21 NOTE — Assessment & Plan Note (Signed)
Patient had injection today and did have good relief. Patient will do home exercise with theraband. Patient will try this on a daily basis. Patient was also given some anti-inflammatories which will be beneficial. Patient will follow up again in 4 weeks. If she does not seem to have improvement we may look at more of cervical radiculopathy. Patient is already seeing neurosurgery for her lower back.

## 2013-06-21 NOTE — Patient Instructions (Addendum)
Great to meet you Happy Holidays!!! Kenalog injection was done, subacromial bursitis.  Try exercises daily.  Meloxicam daily for 10 days then as needed Come back again in 4 weeks.

## 2013-07-02 ENCOUNTER — Encounter: Payer: Self-pay | Admitting: Physician Assistant

## 2013-07-02 ENCOUNTER — Ambulatory Visit (INDEPENDENT_AMBULATORY_CARE_PROVIDER_SITE_OTHER): Payer: BC Managed Care – PPO | Admitting: Physician Assistant

## 2013-07-02 VITALS — BP 118/78 | HR 88 | Temp 98.5°F | Resp 16 | Ht 65.0 in | Wt 179.5 lb

## 2013-07-02 DIAGNOSIS — J209 Acute bronchitis, unspecified: Secondary | ICD-10-CM

## 2013-07-02 MED ORDER — HYDROCOD POLST-CHLORPHEN POLST 10-8 MG/5ML PO LQCR: 5.0000 mL | Freq: Two times a day (BID) | ORAL | Status: DC | PRN

## 2013-07-02 MED ORDER — FLUTICASONE PROPIONATE 50 MCG/ACT NA SUSP: 2.0000 | Freq: Every day | NASAL | Status: DC

## 2013-07-02 MED ORDER — AZITHROMYCIN 250 MG PO TABS: ORAL_TABLET | ORAL | Status: DC

## 2013-07-02 NOTE — Progress Notes (Signed)
Pre visit review using our clinic review tool, if applicable. No additional management support is needed unless otherwise documented below in the visit note/SLS  

## 2013-07-02 NOTE — Progress Notes (Signed)
Patient ID: [name], female   DOB: 1956/05/23, 57 y.o.   MRN: [id number]  Patient presents to clinic today complaining of one week of head congestion, chest congestion, postnasal drainage, sore throat, dry cough and chest tightness. Patient denies fever, myalgias, recent travel or sick contact. Patient denies history of asthma. Does endorse seasonal allergies. Patient has also noticed bilateral ear pain with left ear worse than right ear.  Past Medical History  Diagnosis Date  . Allergy     seasonal  . Anxiety   . Depression   . Hyperlipidemia   . History of proteinuria syndrome   . Edema extremities   . RLS (restless legs syndrome)   . Dyspareunia   . Obese   . Onychomycosis 05/09/2011  . Low back pain 05/09/2011  . RUQ pain 09/20/2011  . Hip pain, left 11/01/2011  . Lymphadenitis 06/16/2012  . Otitis externa 06/23/2012    left  . GERD (gastroesophageal reflux disease)   . Insomnia 03/11/2013  . Perimenopause 03/11/2013  . Acute sinusitis 03/27/2013  . Unspecified constipation 05/13/2013  . Arthritis 06/09/2013    Current Outpatient Prescriptions on File Prior to Visit  Medication Sig Dispense Refill  . baclofen (LIORESAL) 20 MG tablet Take 1 tablet (20 mg total) by mouth 4 (four) times daily as needed for muscle spasms.  120 each  1  . BIOTIN PO Take by mouth.      . Calcium Carbonate-Vitamin D (CALTRATE 600+D) 600-400 MG-UNIT per tablet Take 1 tablet by mouth daily.       . Cholecalciferol (VITAMIN D3) 1000 UNITS CAPS Take by mouth.        . conjugated estrogens (PREMARIN) vaginal cream Place vaginally as directed. 3 times a week at bedtime      . diazepam (VALIUM) 10 MG tablet Take 1 tablet (10 mg total) by mouth daily as needed for anxiety.  40 tablet  2  . docusate sodium (COLACE) 100 MG capsule Take 1 capsule (100 mg total) by mouth 2 (two) times daily.  60 capsule  2  . FLAXSEED, LINSEED, PO Take by mouth every other day.       . furosemide (LASIX) 40 MG tablet 1 tab  po daily and a 2 nd tab prn increased edema  135 tablet  1  . lamoTRIgine (LAMICTAL) 200 MG tablet Take 200 mg by mouth daily.        . meloxicam (MOBIC) 15 MG tablet Take 1 tablet (15 mg total) by mouth daily.  30 tablet  0  . NEOMYCIN-POLYMYXIN-HC, OTIC, (CORTISPORIN) 1 % SOLN Place 3 drops into both ears every 8 (eight) hours. Itching or irritation  10 mL  0  . Nutritional Supplements (ESTROVEN PO) Take by mouth.      Marland Kitchen omeprazole (PRILOSEC) 40 MG capsule Take 40 mg by mouth daily.      . phentermine 30 MG capsule Take 1 capsule (30 mg total) by mouth every morning.  30 capsule  0  . potassium chloride SA (K-DUR,KLOR-CON) 20 MEQ tablet TAKE 1 TABLET BY MOUTH DAILY & A 2ND TABLET AS NEEDED W/ INCREASED LASIXUSE  45 tablet  5  . promethazine (PHENERGAN) 25 MG tablet Take 1 tablet (25 mg total) by mouth every 8 (eight) hours as needed for nausea.  40 tablet  2  . rOPINIRole (REQUIP) 1 MG tablet Take 1 mg by mouth daily.        . simvastatin (ZOCOR) 20 MG tablet TAKE 1 TABLET BY  MOUTH ATBEDTIME  90 tablet  1  . solifenacin (VESICARE) 5 MG tablet Take 10 mg by mouth daily.      . temazepam (RESTORIL) 15 MG capsule Take 1 capsule (15 mg total) by mouth at bedtime as needed for sleep.  30 capsule  0  . traMADol (ULTRAM) 50 MG tablet Take 2 tablets (100 mg total) by mouth every 8 (eight) hours as needed for moderate pain or severe pain.  120 tablet  0  . venlafaxine XR (EFFEXOR-XR) 150 MG 24 hr capsule Take 225 mg by mouth daily with breakfast.       No current facility-administered medications on file prior to visit.    Allergies  Allergen Reactions  . Ceclor [Cefaclor]     Tongue swell    Family History  Problem Relation Age of Onset  . Cancer Mother 20    thyroid  . Arthritis Mother     nreck, back  . Hepatitis Sister     Hepatitis C  . Diabetes Maternal Grandmother     History   Social History  . Marital Status: Married    Spouse Name: N/A    Number of Children: N/A  . Years  of Education: N/A   Social History Main Topics  . Smoking status: Former Smoker -- 0.50 packs/day for 20 years    Types: Cigarettes    Quit date: 07/12/1992  . Smokeless tobacco: Never Used  . Alcohol Use: Yes     Comment: occasionaly  . Drug Use: No  . Sexual Activity: Yes    Partners: Male   Other Topics Concern  . None   Social History Narrative  . None   Review of Systems - See HPI.  All other ROS are negative.  Filed Vitals:   07/02/13 1614  BP: 118/78  Pulse: 88  Temp: 98.5 F (36.9 C)  Resp: 16    Physical Exam  Vitals reviewed. Constitutional: She is oriented to person, place, and time and well-developed, well-nourished, and in no distress.  HENT:  Head: Normocephalic and atraumatic.  Right Ear: External ear normal.  Left Ear: External ear normal.  Nose: Nose normal.  Mouth/Throat: Oropharynx is clear and moist.  Tympanic membranes within normal limits bilaterally.  Eyes: Conjunctivae are normal. Pupils are equal, round, and reactive to light.  Neck: Neck supple.  Cardiovascular: Normal rate, regular rhythm, normal heart sounds and intact distal pulses.   Pulmonary/Chest: Effort normal and breath sounds normal. No respiratory distress. She has no wheezes. She has no rales. She exhibits no tenderness.  Lymphadenopathy:    She has no cervical adenopathy.  Neurological: She is alert and oriented to person, place, and time.  Skin: Skin is warm and dry. No rash noted.  Psychiatric: Affect normal.    Recent Results (from the past 2160 hour(s))  LIPID PANEL     Status: Abnormal   Collection Time    05/03/13 12:17 PM      Result Value Range   Cholesterol 191  0 - 200 mg/dL   Comment: ATP III Classification:           < 200        mg/dL        Desirable          200 - 239     mg/dL        Borderline High          >= 240        mg/dL  High         Triglycerides 203 (*) <150 mg/dL   HDL 49  >47 mg/dL   Total CHOL/HDL Ratio 3.9     VLDL 41 (*) 0 - 40  mg/dL   LDL Cholesterol 829 (*) 0 - 99 mg/dL   Comment:       Total Cholesterol/HDL Ratio:CHD Risk                            Coronary Heart Disease Risk Table                                            Men       Women              1/2 Average Risk              3.4        3.3                  Average Risk              5.0        4.4               2X Average Risk              9.6        7.1               3X Average Risk             23.4       11.0     Use the calculated Patient Ratio above and the CHD Risk table      to determine the patient's CHD Risk.     ATP III Classification (LDL):           < 100        mg/dL         Optimal          100 - 129     mg/dL         Near or Above Optimal          130 - 159     mg/dL         Borderline High          160 - 189     mg/dL         High           > 190        mg/dL         Very High        RENAL FUNCTION PANEL     Status: None   Collection Time    05/03/13 12:17 PM      Result Value Range   Sodium 136  135 - 145 mEq/L   Potassium 4.1  3.5 - 5.3 mEq/L   Chloride 98  96 - 112 mEq/L   CO2 28  19 - 32 mEq/L   Glucose, Bld 82  70 - 99 mg/dL   BUN 15  6 - 23 mg/dL   Creat 5.62  1.30 - 8.65 mg/dL   Albumin 4.5  3.5 - 5.2 g/dL   Calcium 9.9  8.4 - 78.4 mg/dL   Phosphorus 4.3  2.3 - 4.6 mg/dL  CBC     Status: None   Collection Time    05/03/13 12:17 PM      Result Value Range   WBC 7.3  4.0 - 10.5 K/uL   RBC 4.85  3.87 - 5.11 MIL/uL   Hemoglobin 14.4  12.0 - 15.0 g/dL   HCT 16.1  09.6 - 04.5 %   MCV 89.1  78.0 - 100.0 fL   MCH 29.7  26.0 - 34.0 pg   MCHC 33.3  30.0 - 36.0 g/dL   RDW 40.9  81.1 - 91.4 %   Platelets 267  150 - 400 K/uL  TSH     Status: None   Collection Time    05/03/13 12:17 PM      Result Value Range   TSH 1.661  0.350 - 4.500 uIU/mL  HEPATIC FUNCTION PANEL     Status: None   Collection Time    05/03/13 12:17 PM      Result Value Range   Total Bilirubin 0.5  0.3 - 1.2 mg/dL   Bilirubin, Direct 0.1  0.0 - 0.3  mg/dL   Indirect Bilirubin 0.4  0.0 - 0.9 mg/dL   Alkaline Phosphatase 53  39 - 117 U/L   AST 20  0 - 37 U/L   ALT 25  0 - 35 U/L   Total Protein 7.0  6.0 - 8.3 g/dL   Albumin 4.5  3.5 - 5.2 g/dL    Assessment/Plan: Acute bronchitis Rx azithromycin. Rx Flonase. Rx Tussionex. Increase fluid intake. Rest. Please humidifier in bedroom. Over-the-counter medications for symptomatic care.

## 2013-07-02 NOTE — Patient Instructions (Signed)
Please take medications as directed.  Increase fluid intake.  Mucinex-D.  Claritin at bedtime.  Place a humidifier in bedroom.  Call or return to clinic if symptoms not improving.

## 2013-07-03 DIAGNOSIS — J209 Acute bronchitis, unspecified: Secondary | ICD-10-CM | POA: Insufficient documentation

## 2013-07-03 NOTE — Assessment & Plan Note (Signed)
Rx azithromycin. Rx Flonase. Rx Tussionex. Increase fluid intake. Rest. Please humidifier in bedroom. Over-the-counter medications for symptomatic care.

## 2013-07-09 ENCOUNTER — Ambulatory Visit: Payer: BC Managed Care – PPO | Admitting: Family Medicine

## 2013-07-09 MED ORDER — METHYLPREDNISOLONE ACETATE 80 MG/ML IJ SUSP
80.0000 mg | Freq: Once | INTRAMUSCULAR | Status: AC
  Administered 2013-07-02: 80 mg via INTRAMUSCULAR

## 2013-07-09 NOTE — Addendum Note (Signed)
Addended by: Regis Bill on: 07/09/2013 11:55 AM   Modules accepted: Orders

## 2013-07-10 ENCOUNTER — Encounter: Payer: Self-pay | Admitting: Family Medicine

## 2013-07-10 ENCOUNTER — Ambulatory Visit (INDEPENDENT_AMBULATORY_CARE_PROVIDER_SITE_OTHER): Payer: BC Managed Care – PPO | Admitting: Family Medicine

## 2013-07-10 VITALS — BP 118/80 | HR 81 | Temp 97.8°F | Ht 65.0 in | Wt 177.0 lb

## 2013-07-10 DIAGNOSIS — R609 Edema, unspecified: Secondary | ICD-10-CM

## 2013-07-10 DIAGNOSIS — R110 Nausea: Secondary | ICD-10-CM

## 2013-07-10 DIAGNOSIS — J209 Acute bronchitis, unspecified: Secondary | ICD-10-CM

## 2013-07-10 DIAGNOSIS — E669 Obesity, unspecified: Secondary | ICD-10-CM

## 2013-07-10 DIAGNOSIS — R600 Localized edema: Secondary | ICD-10-CM

## 2013-07-10 MED ORDER — HYDROCOD POLST-CHLORPHEN POLST 10-8 MG/5ML PO LQCR: 5.0000 mL | Freq: Two times a day (BID) | ORAL | Status: DC | PRN

## 2013-07-10 MED ORDER — PROMETHAZINE HCL 25 MG PO TABS: 25.0000 mg | ORAL_TABLET | Freq: Three times a day (TID) | ORAL | Status: DC | PRN

## 2013-07-10 MED ORDER — ALBUTEROL SULFATE HFA 108 (90 BASE) MCG/ACT IN AERS: 2.0000 | INHALATION_SPRAY | Freq: Four times a day (QID) | RESPIRATORY_TRACT | Status: DC | PRN

## 2013-07-10 MED ORDER — AMOXICILLIN-POT CLAVULANATE 875-125 MG PO TABS: 1.0000 | ORAL_TABLET | Freq: Two times a day (BID) | ORAL | Status: DC

## 2013-07-10 MED ORDER — METHYLPREDNISOLONE (PAK) 4 MG PO TABS: ORAL_TABLET | ORAL | Status: DC

## 2013-07-10 MED ORDER — PANTOPRAZOLE SODIUM 40 MG PO TBEC: 40.0000 mg | DELAYED_RELEASE_TABLET | Freq: Every day | ORAL | Status: DC

## 2013-07-10 NOTE — Patient Instructions (Signed)

## 2013-07-10 NOTE — Progress Notes (Signed)
Pre visit review using our clinic review tool, if applicable. No additional management support is needed unless otherwise documented below in the visit note. 

## 2013-07-13 ENCOUNTER — Other Ambulatory Visit: Payer: Self-pay | Admitting: Family Medicine

## 2013-07-16 DIAGNOSIS — R110 Nausea: Secondary | ICD-10-CM | POA: Insufficient documentation

## 2013-07-16 NOTE — Assessment & Plan Note (Signed)
Avoid offending foods, use Ginger prn. Given refill on Phenergan.

## 2013-07-16 NOTE — Assessment & Plan Note (Signed)
Will hold Phentermine for now. Encouraged DASH diet.

## 2013-07-16 NOTE — Assessment & Plan Note (Signed)
No recent flares 

## 2013-07-16 NOTE — Assessment & Plan Note (Signed)
Given antibiotics and Phenergan, increase rest and hydration. Albuterol prn report worsening symptoms

## 2013-07-16 NOTE — Progress Notes (Signed)
Patient ID: [name], female   DOB: January 04, 1956, 58 y.o.   MRN: [id number] [name] [id number] 1955-12-22 07/16/2013      Progress Note-Follow Up  Subjective  Chief Complaint  Chief Complaint  Patient presents with  . Follow-up    4 month  . chest congestion    X 3 weeks- hardly any phlegm    HPI  Patient is a 58 year old Caucasian female who is in today for followup. She is complaining of a persistent cough. She says she tightness in her chest and her cough is keeping her awake. It is generally nonproductive but she does feel phlegm in her nose and throat. No fevers or chills but she does have nausea and some occasional sweats which might be low-grade fevers. She is having trouble sleeping due to the cough. No chest pain or palpitations. No shortness of breath GI or GU complaints.  Past Medical History  Diagnosis Date  . Allergy     seasonal  . Anxiety   . Depression   . Hyperlipidemia   . History of proteinuria syndrome   . Edema extremities   . RLS (restless legs syndrome)   . Dyspareunia   . Obese   . Onychomycosis 05/09/2011  . Low back pain 05/09/2011  . RUQ pain 09/20/2011  . Hip pain, left 11/01/2011  . Lymphadenitis 06/16/2012  . Otitis externa 06/23/2012    left  . GERD (gastroesophageal reflux disease)   . Insomnia 03/11/2013  . Perimenopause 03/11/2013  . Acute sinusitis 03/27/2013  . Unspecified constipation 05/13/2013  . Arthritis 06/09/2013    Past Surgical History  Procedure Laterality Date  . Cesarean section  1993  . Back surgery  2006    low, discectomy for herniated disc  . Tubal ligation  1993  . Carpal tunnel release Right 2006  . Bladder suspension N/A 11/07/2012    Procedure: [facility] SLING;  Surgeon: Malka So, MD;  Location: [facility];  Service: Urology;  Laterality: N/A;  . Cystoscopy N/A 11/07/2012    Procedure: CYSTOSCOPY;  Surgeon: Malka So, MD;  Location: [facility];  Service: Urology;   Laterality: N/A;    Family History  Problem Relation Age of Onset  . Cancer Mother 109    thyroid  . Arthritis Mother     nreck, back  . Hepatitis Sister     Hepatitis C  . Diabetes Maternal Grandmother     History   Social History  . Marital Status: Married    Spouse Name: N/A    Number of Children: N/A  . Years of Education: N/A   Occupational History  . Not on file.   Social History Main Topics  . Smoking status: Former Smoker -- 0.50 packs/day for 20 years    Types: Cigarettes    Quit date: 07/12/1992  . Smokeless tobacco: Never Used  . Alcohol Use: Yes     Comment: occasionaly  . Drug Use: No  . Sexual Activity: Yes    Partners: Male   Other Topics Concern  . Not on file   Social History Narrative  . No narrative on file    Current Outpatient Prescriptions on File Prior to Visit  Medication Sig Dispense Refill  . baclofen (LIORESAL) 20 MG tablet Take 1 tablet (20 mg total) by mouth 4 (four) times daily as needed for muscle spasms.  120 each  1  . BIOTIN PO Take by mouth.      Marland Kitchen  Calcium Carbonate-Vitamin D (CALTRATE 600+D) 600-400 MG-UNIT per tablet Take 1 tablet by mouth daily.       . Cholecalciferol (VITAMIN D3) 1000 UNITS CAPS Take by mouth.        . conjugated estrogens (PREMARIN) vaginal cream Place vaginally as directed. 3 times a week at bedtime      . diazepam (VALIUM) 10 MG tablet Take 1 tablet (10 mg total) by mouth daily as needed for anxiety.  40 tablet  2  . FLAXSEED, LINSEED, PO Take by mouth every other day.       . fluticasone (FLONASE) 50 MCG/ACT nasal spray Place 2 sprays into both nostrils daily.  16 g  6  . furosemide (LASIX) 40 MG tablet 1 tab po daily and a 2 nd tab prn increased edema  135 tablet  1  . lamoTRIgine (LAMICTAL) 200 MG tablet Take 200 mg by mouth daily.        . NEOMYCIN-POLYMYXIN-HC, OTIC, (CORTISPORIN) 1 % SOLN Place 3 drops into both ears every 8 (eight) hours. Itching or irritation  10 mL  0  . Nutritional Supplements  (ESTROVEN PO) Take by mouth.      Marland Kitchen rOPINIRole (REQUIP) 1 MG tablet Take 1 mg by mouth daily.        . simvastatin (ZOCOR) 20 MG tablet TAKE 1 TABLET BY MOUTH ATBEDTIME  90 tablet  1  . temazepam (RESTORIL) 15 MG capsule Take 1 capsule (15 mg total) by mouth at bedtime as needed for sleep.  30 capsule  0  . traMADol (ULTRAM) 50 MG tablet Take 2 tablets (100 mg total) by mouth every 8 (eight) hours as needed for moderate pain or severe pain.  120 tablet  0  . venlafaxine XR (EFFEXOR-XR) 150 MG 24 hr capsule Take 225 mg by mouth daily with breakfast.      . omeprazole (PRILOSEC) 40 MG capsule Take 40 mg by mouth daily.       No current facility-administered medications on file prior to visit.    Allergies  Allergen Reactions  . Ceclor [Cefaclor]     Tongue swell    Review of Systems  Review of Systems  Constitutional: Positive for malaise/fatigue. Negative for fever.  HENT: Positive for congestion.   Eyes: Negative for discharge.  Respiratory: Positive for cough. Negative for shortness of breath.   Cardiovascular: Negative for chest pain, palpitations and leg swelling.  Gastrointestinal: Positive for nausea. Negative for abdominal pain and diarrhea.  Genitourinary: Negative for dysuria.  Musculoskeletal: Negative for falls.  Skin: Negative for rash.  Neurological: Positive for headaches. Negative for loss of consciousness.  Endo/Heme/Allergies: Negative for polydipsia.  Psychiatric/Behavioral: Positive for depression. Negative for suicidal ideas. The patient has insomnia. The patient is not nervous/anxious.     Objective  BP 118/80  Pulse 81  Temp(Src) 97.8 F (36.6 C) (Oral)  Ht 5\' 5"  (1.651 m)  Wt 177 lb 0.6 oz (80.305 kg)  BMI 29.46 kg/m2  SpO2 95%  LMP 10/03/2012  Physical Exam  Physical Exam  Constitutional: She is oriented to person, place, and time and well-developed, well-nourished, and in no distress. No distress.  HENT:  Head: Normocephalic and atraumatic.   Eyes: Conjunctivae are normal.  Neck: Neck supple. No thyromegaly present.  Cardiovascular: Normal rate, regular rhythm and normal heart sounds.   No murmur heard. Pulmonary/Chest: Effort normal and breath sounds normal. She has no wheezes.  Abdominal: She exhibits no distension and no mass.  Musculoskeletal: She exhibits no  edema.  Lymphadenopathy:    She has no cervical adenopathy.  Neurological: She is alert and oriented to person, place, and time.  Skin: Skin is warm and dry. No rash noted. She is not diaphoretic.  Psychiatric: Memory, affect and judgment normal.    Lab Results  Component Value Date   TSH 1.661 05/03/2013   Lab Results  Component Value Date   WBC 7.3 05/03/2013   HGB 14.4 05/03/2013   HCT 43.2 05/03/2013   MCV 89.1 05/03/2013   PLT 267 05/03/2013   Lab Results  Component Value Date   CREATININE 0.68 05/03/2013   BUN 15 05/03/2013   NA 136 05/03/2013   K 4.1 05/03/2013   CL 98 05/03/2013   CO2 28 05/03/2013   Lab Results  Component Value Date   ALT 25 05/03/2013   AST 20 05/03/2013   ALKPHOS 53 05/03/2013   BILITOT 0.5 05/03/2013   Lab Results  Component Value Date   CHOL 191 05/03/2013   Lab Results  Component Value Date   HDL 49 05/03/2013   Lab Results  Component Value Date   LDLCALC 101* 05/03/2013   Lab Results  Component Value Date   TRIG 203* 05/03/2013   Lab Results  Component Value Date   CHOLHDL 3.9 05/03/2013     Assessment & Plan  Nausea Avoid offending foods, use Ginger prn. Given refill on Phenergan.   Acute bronchitis Given antibiotics and Phenergan, increase rest and hydration. Albuterol prn report worsening symptoms  Obese Will hold Phentermine for now. Encouraged DASH diet.   Edema extremities No recent flares.

## 2013-07-23 ENCOUNTER — Ambulatory Visit: Payer: BC Managed Care – PPO | Admitting: Family Medicine

## 2013-08-06 ENCOUNTER — Telehealth: Payer: Self-pay | Admitting: Family Medicine

## 2013-08-06 DIAGNOSIS — G4700 Insomnia, unspecified: Secondary | ICD-10-CM

## 2013-08-06 DIAGNOSIS — M545 Low back pain, unspecified: Secondary | ICD-10-CM

## 2013-08-06 DIAGNOSIS — M549 Dorsalgia, unspecified: Secondary | ICD-10-CM

## 2013-08-06 NOTE — Telephone Encounter (Signed)
Refill- diazepam 10mg  tablet. Take one tablet by mouth every 12 hours as needed for anxiety or sleep. Qty 40 last fill 12.8.14  Refill- temazepam 15mg  capsule. Take one capsule by mouth at bedtime as needed for sleep. Qty 30 last fill 12.1.14

## 2013-08-08 MED ORDER — TEMAZEPAM 15 MG PO CAPS: 15.0000 mg | ORAL_CAPSULE | Freq: Every evening | ORAL | Status: DC | PRN

## 2013-08-08 MED ORDER — DIAZEPAM 10 MG PO TABS: 10.0000 mg | ORAL_TABLET | Freq: Every day | ORAL | Status: DC | PRN

## 2013-08-08 NOTE — Telephone Encounter (Signed)
Rx's called to pharmacy voicemail.

## 2013-08-08 NOTE — Telephone Encounter (Signed)
OK to refill Diazepam and Temazepam with same strength, same sig, same number, no further refills

## 2013-08-23 ENCOUNTER — Ambulatory Visit (INDEPENDENT_AMBULATORY_CARE_PROVIDER_SITE_OTHER): Payer: BC Managed Care – PPO | Admitting: Family Medicine

## 2013-08-23 ENCOUNTER — Encounter: Payer: Self-pay | Admitting: Family Medicine

## 2013-08-23 VITALS — BP 118/70 | HR 89 | Temp 98.1°F | Ht 65.0 in | Wt 185.1 lb

## 2013-08-23 DIAGNOSIS — T148XXA Other injury of unspecified body region, initial encounter: Secondary | ICD-10-CM

## 2013-08-23 DIAGNOSIS — R5383 Other fatigue: Secondary | ICD-10-CM

## 2013-08-23 DIAGNOSIS — K1370 Unspecified lesions of oral mucosa: Secondary | ICD-10-CM

## 2013-08-23 DIAGNOSIS — K219 Gastro-esophageal reflux disease without esophagitis: Secondary | ICD-10-CM

## 2013-08-23 DIAGNOSIS — R8290 Unspecified abnormal findings in urine: Secondary | ICD-10-CM

## 2013-08-23 DIAGNOSIS — R110 Nausea: Secondary | ICD-10-CM

## 2013-08-23 DIAGNOSIS — R0789 Other chest pain: Secondary | ICD-10-CM

## 2013-08-23 DIAGNOSIS — R5381 Other malaise: Secondary | ICD-10-CM

## 2013-08-23 DIAGNOSIS — R82998 Other abnormal findings in urine: Secondary | ICD-10-CM

## 2013-08-23 LAB — POCT URINALYSIS DIPSTICK
Bilirubin, UA: NEGATIVE
Glucose, UA: NEGATIVE
KETONES UA: NEGATIVE
Leukocytes, UA: NEGATIVE
Nitrite, UA: NEGATIVE
PH UA: 5
PROTEIN UA: NEGATIVE
Spec Grav, UA: 1.01
Urobilinogen, UA: 0.2

## 2013-08-23 MED ORDER — POTASSIUM CHLORIDE CRYS ER 20 MEQ PO TBCR: EXTENDED_RELEASE_TABLET | ORAL | Status: DC

## 2013-08-23 NOTE — Patient Instructions (Signed)
Start a probiotic daily   Gastroesophageal Reflux Disease, Adult Gastroesophageal reflux disease (GERD) happens when acid from your stomach flows up into the esophagus. When acid comes in contact with the esophagus, the acid causes soreness (inflammation) in the esophagus. Over time, GERD may create small holes (ulcers) in the lining of the esophagus. CAUSES   Increased body weight. This puts pressure on the stomach, making acid rise from the stomach into the esophagus.  Smoking. This increases acid production in the stomach.  Drinking alcohol. This causes decreased pressure in the lower esophageal sphincter (valve or ring of muscle between the esophagus and stomach), allowing acid from the stomach into the esophagus.  Late evening meals and a full stomach. This increases pressure and acid production in the stomach.  A malformed lower esophageal sphincter. Sometimes, no cause is found. SYMPTOMS   Burning pain in the lower part of the mid-chest behind the breastbone and in the mid-stomach area. This may occur twice a week or more often.  Trouble swallowing.  Sore throat.  Dry cough.  Asthma-like symptoms including chest tightness, shortness of breath, or wheezing. DIAGNOSIS  Your caregiver may be able to diagnose GERD based on your symptoms. In some cases, X-rays and other tests may be done to check for complications or to check the condition of your stomach and esophagus. TREATMENT  Your caregiver may recommend over-the-counter or prescription medicines to help decrease acid production. Ask your caregiver before starting or adding any new medicines.  HOME CARE INSTRUCTIONS   Change the factors that you can control. Ask your caregiver for guidance concerning weight loss, quitting smoking, and alcohol consumption.  Avoid foods and drinks that make your symptoms worse, such as:  Caffeine or alcoholic drinks.  Chocolate.  Peppermint or mint flavorings.  Garlic and  onions.  Spicy foods.  Citrus fruits, such as oranges, lemons, or limes.  Tomato-based foods such as sauce, chili, salsa, and pizza.  Fried and fatty foods.  Avoid lying down for the 3 hours prior to your bedtime or prior to taking a nap.  Eat small, frequent meals instead of large meals.  Wear loose-fitting clothing. Do not wear anything tight around your waist that causes pressure on your stomach.  Raise the head of your bed 6 to 8 inches with wood blocks to help you sleep. Extra pillows will not help.  Only take over-the-counter or prescription medicines for pain, discomfort, or fever as directed by your caregiver.  Do not take aspirin, ibuprofen, or other nonsteroidal anti-inflammatory drugs (NSAIDs). SEEK IMMEDIATE MEDICAL CARE IF:   You have pain in your arms, neck, jaw, teeth, or back.  Your pain increases or changes in intensity or duration.  You develop nausea, vomiting, or sweating (diaphoresis).  You develop shortness of breath, or you faint.  Your vomit is green, yellow, black, or looks like coffee grounds or blood.  Your stool is red, bloody, or black. These symptoms could be signs of other problems, such as heart disease, gastric bleeding, or esophageal bleeding. MAKE SURE YOU:   Understand these instructions.  Will watch your condition.  Will get help right away if you are not doing well or get worse. Document Released: 04/07/2005 Document Revised: 09/20/2011 Document Reviewed: 01/15/2011 [facility] Patient Information 2014 [place], [place].

## 2013-08-23 NOTE — Progress Notes (Signed)
Pre visit review using our clinic review tool, if applicable. No additional management support is needed unless otherwise documented below in the visit note. 

## 2013-08-24 ENCOUNTER — Telehealth: Payer: Self-pay | Admitting: Family Medicine

## 2013-08-24 LAB — HEPATIC FUNCTION PANEL
ALT: 18 U/L (ref 0–35)
AST: 17 U/L (ref 0–37)
Albumin: 4.2 g/dL (ref 3.5–5.2)
Alkaline Phosphatase: 43 U/L (ref 39–117)
BILIRUBIN DIRECT: 0.1 mg/dL (ref 0.0–0.3)
Indirect Bilirubin: 0.3 mg/dL (ref 0.2–1.2)
Total Bilirubin: 0.4 mg/dL (ref 0.2–1.2)
Total Protein: 6.5 g/dL (ref 6.0–8.3)

## 2013-08-24 LAB — RENAL FUNCTION PANEL
Albumin: 4.2 g/dL (ref 3.5–5.2)
BUN: 19 mg/dL (ref 6–23)
CO2: 31 mEq/L (ref 19–32)
Calcium: 9.2 mg/dL (ref 8.4–10.5)
Chloride: 98 mEq/L (ref 96–112)
Creat: 0.83 mg/dL (ref 0.50–1.10)
Glucose, Bld: 76 mg/dL (ref 70–99)
POTASSIUM: 4 meq/L (ref 3.5–5.3)
Phosphorus: 4.1 mg/dL (ref 2.3–4.6)
Sodium: 142 mEq/L (ref 135–145)

## 2013-08-24 LAB — CBC
HCT: 40 % (ref 36.0–46.0)
Hemoglobin: 13.5 g/dL (ref 12.0–15.0)
MCH: 29.3 pg (ref 26.0–34.0)
MCHC: 33.8 g/dL (ref 30.0–36.0)
MCV: 86.8 fL (ref 78.0–100.0)
PLATELETS: 267 10*3/uL (ref 150–400)
RBC: 4.61 MIL/uL (ref 3.87–5.11)
RDW: 15.1 % (ref 11.5–15.5)
WBC: 6.7 10*3/uL (ref 4.0–10.5)

## 2013-08-24 LAB — TSH: TSH: 1.574 u[IU]/mL (ref 0.350–4.500)

## 2013-08-24 NOTE — Telephone Encounter (Signed)
Patient states that she thought that Dr. Charlett Blake was going to refer her to a gastroenterologist? There is not a referral in there for that. Please assist.

## 2013-08-24 NOTE — Telephone Encounter (Signed)
What I actually told her is we will check an H pylori and if it is positive we will treat that and see how she does, then would refer if does not get better. If she is negative would refer due to symptoms. Am awaiting lab results presently. I can refer sooner but it may not be necessary

## 2013-08-25 LAB — CULTURE, URINE COMPREHENSIVE
Colony Count: NO GROWTH
Organism ID, Bacteria: NO GROWTH

## 2013-08-26 ENCOUNTER — Encounter: Payer: Self-pay | Admitting: Family Medicine

## 2013-08-26 DIAGNOSIS — R0789 Other chest pain: Secondary | ICD-10-CM | POA: Insufficient documentation

## 2013-08-26 DIAGNOSIS — K1370 Unspecified lesions of oral mucosa: Secondary | ICD-10-CM

## 2013-08-26 DIAGNOSIS — R350 Frequency of micturition: Secondary | ICD-10-CM

## 2013-08-26 DIAGNOSIS — K219 Gastro-esophageal reflux disease without esophagitis: Secondary | ICD-10-CM | POA: Insufficient documentation

## 2013-08-26 HISTORY — DX: Frequency of micturition: R35.0

## 2013-08-26 HISTORY — DX: Unspecified lesions of oral mucosa: K13.70

## 2013-08-26 NOTE — Assessment & Plan Note (Signed)
Patient reports seeing an oral surgeon and being told she has shingles in her mouth, no evidence of lesions today

## 2013-08-26 NOTE — Progress Notes (Signed)
Patient ID: [name], female   DOB: [birth year], 58 y.o.   MRN: [id number] NYKIRA FLANERY [id number] Jul 19, 1955 08/26/2013      Progress Note-Follow Up  Subjective  Chief Complaint  Chief Complaint  Patient presents with  . Follow-up    6 week     HPI  Patient is 58 year old Caucasian female who is in today for followup. She reports urinary frequency and malodorous urine. No fevers, chills. She continues to struggle with significant anxiety and depression but denies suicidal ideation. She has chronic pain. Pain is noted in hips back, most joints and muscles. She is complaining of some atypical chest pain generally in epigastrium and substernal. She notes it is more of a pressure especially when she's stressed. Sometimes she feels short of breath during these episodes. Denies palpitations. No recent illness.  Past Medical History  Diagnosis Date  . Allergy     seasonal  . Anxiety   . Depression   . Hyperlipidemia   . History of proteinuria syndrome   . Edema extremities   . RLS (restless legs syndrome)   . Dyspareunia   . Obese   . Onychomycosis 05/09/2011  . Low back pain 05/09/2011  . RUQ pain 09/20/2011  . Hip pain, left 11/01/2011  . Lymphadenitis 06/16/2012  . Otitis externa 06/23/2012    left  . GERD (gastroesophageal reflux disease)   . Insomnia 03/11/2013  . Perimenopause 03/11/2013  . Acute sinusitis 03/27/2013  . Unspecified constipation 05/13/2013  . Arthritis 06/09/2013  . Urinary frequency 08/26/2013  . Oral lesion 08/26/2013    Past Surgical History  Procedure Laterality Date  . Cesarean section  1993  . Back surgery  2006    low, discectomy for herniated disc  . Tubal ligation  1993  . Carpal tunnel release Right 2006  . Bladder suspension N/A 11/07/2012    Procedure: [facility] SLING;  Surgeon: Malka So, MD;  Location: [facility];  Service: Urology;  Laterality: N/A;  . Cystoscopy N/A 11/07/2012    Procedure: CYSTOSCOPY;  Surgeon: Malka So, MD;  Location: [facility];  Service: Urology;  Laterality: N/A;    Family History  Problem Relation Age of Onset  . Cancer Mother 75    thyroid  . Arthritis Mother     nreck, back  . Hepatitis Sister     Hepatitis C  . Diabetes Maternal Grandmother     History   Social History  . Marital Status: Married    Spouse Name: N/A    Number of Children: N/A  . Years of Education: N/A   Occupational History  . Not on file.   Social History Main Topics  . Smoking status: Former Smoker -- 0.50 packs/day for 20 years    Types: Cigarettes    Quit date: 07/12/1992  . Smokeless tobacco: Never Used  . Alcohol Use: Yes     Comment: occasionaly  . Drug Use: No  . Sexual Activity: Yes    Partners: Male   Other Topics Concern  . Not on file   Social History Narrative  . No narrative on file    Current Outpatient Prescriptions on File Prior to Visit  Medication Sig Dispense Refill  . albuterol (PROVENTIL HFA;VENTOLIN HFA) 108 (90 BASE) MCG/ACT inhaler Inhale 2 puffs into the lungs every 6 (six) hours as needed for wheezing or shortness of breath.  1 Inhaler  2  . baclofen (LIORESAL) 20 MG tablet Take 1  tablet (20 mg total) by mouth 4 (four) times daily as needed for muscle spasms.  120 each  1  . BIOTIN PO Take by mouth.      . Calcium Carbonate-Vitamin D (CALTRATE 600+D) 600-400 MG-UNIT per tablet Take 1 tablet by mouth daily.       . Cholecalciferol (VITAMIN D3) 1000 UNITS CAPS Take by mouth.        . conjugated estrogens (PREMARIN) vaginal cream Place vaginally as directed. 3 times a week at bedtime      . diazepam (VALIUM) 10 MG tablet Take 1 tablet (10 mg total) by mouth daily as needed for anxiety.  40 tablet  0  . FLAXSEED, LINSEED, PO Take by mouth every other day.       . fluticasone (FLONASE) 50 MCG/ACT nasal spray Place 2 sprays into both nostrils daily.  16 g  6  . furosemide (LASIX) 40 MG tablet 1 tab po daily and a 2 nd tab prn increased edema   135 tablet  1  . lamoTRIgine (LAMICTAL) 200 MG tablet Take 200 mg by mouth daily.        . NEOMYCIN-POLYMYXIN-HC, OTIC, (CORTISPORIN) 1 % SOLN Place 3 drops into both ears every 8 (eight) hours. Itching or irritation  10 mL  0  . Nutritional Supplements (ESTROVEN PO) Take by mouth.      . pantoprazole (PROTONIX) 40 MG tablet Take 1 tablet (40 mg total) by mouth daily.  30 tablet  3  . promethazine (PHENERGAN) 25 MG tablet Take 1 tablet (25 mg total) by mouth every 8 (eight) hours as needed for nausea.  40 tablet  0  . rOPINIRole (REQUIP) 1 MG tablet Take 1 mg by mouth daily.        . simvastatin (ZOCOR) 20 MG tablet TAKE 1 TABLET BY MOUTH ATBEDTIME  90 tablet  1  . temazepam (RESTORIL) 15 MG capsule Take 1 capsule (15 mg total) by mouth at bedtime as needed for sleep.  30 capsule  0  . traMADol (ULTRAM) 50 MG tablet Take 2 tablets (100 mg total) by mouth every 8 (eight) hours as needed for moderate pain or severe pain.  120 tablet  0  . venlafaxine XR (EFFEXOR-XR) 150 MG 24 hr capsule Take 225 mg by mouth daily with breakfast.      . amoxicillin-clavulanate (AUGMENTIN) 875-125 MG per tablet Take 1 tablet by mouth 2 (two) times daily.  20 tablet  0  . omeprazole (PRILOSEC) 40 MG capsule Take 40 mg by mouth daily.       No current facility-administered medications on file prior to visit.    Allergies  Allergen Reactions  . Ceclor [Cefaclor]     Tongue swell    Review of Systems  Review of Systems  Constitutional: Positive for malaise/fatigue. Negative for fever.  HENT: Negative for congestion.   Eyes: Negative for discharge.  Respiratory: Negative for shortness of breath.   Cardiovascular: Negative for chest pain, palpitations and leg swelling.  Gastrointestinal: Positive for nausea. Negative for abdominal pain and diarrhea.  Genitourinary: Positive for frequency. Negative for dysuria, urgency and hematuria.  Musculoskeletal: Positive for back pain, joint pain and myalgias. Negative  for falls.  Skin: Negative for rash.  Neurological: Positive for headaches. Negative for loss of consciousness.  Endo/Heme/Allergies: Negative for polydipsia.  Psychiatric/Behavioral: Positive for depression. Negative for suicidal ideas. The patient is nervous/anxious. The patient does not have insomnia.     Objective  BP 118/70  Pulse  89  Temp(Src) 98.1 F (36.7 C) (Oral)  Ht 5\' 5"  (1.651 m)  Wt 185 lb 1.3 oz (83.952 kg)  BMI 30.80 kg/m2  SpO2 96%  LMP 10/03/2012  Physical Exam  Physical Exam  Constitutional: She is oriented to person, place, and time and well-developed, well-nourished, and in no distress. No distress.  HENT:  Head: Normocephalic and atraumatic.  Eyes: Conjunctivae are normal.  Neck: Neck supple. No thyromegaly present.  Cardiovascular: Normal rate, regular rhythm and normal heart sounds.   No murmur heard. Pulmonary/Chest: Effort normal and breath sounds normal. She has no wheezes.  Abdominal: She exhibits no distension and no mass.  Musculoskeletal: She exhibits no edema.  Lymphadenopathy:    She has no cervical adenopathy.  Neurological: She is alert and oriented to person, place, and time.  Skin: Skin is warm and dry. No rash noted. She is not diaphoretic.  Psychiatric: Memory, affect and judgment normal.    Lab Results  Component Value Date   TSH 1.574 08/23/2013   Lab Results  Component Value Date   WBC 6.7 08/23/2013   HGB 13.5 08/23/2013   HCT 40.0 08/23/2013   MCV 86.8 08/23/2013   PLT 267 08/23/2013   Lab Results  Component Value Date   CREATININE 0.83 08/23/2013   BUN 19 08/23/2013   NA 142 08/23/2013   K 4.0 08/23/2013   CL 98 08/23/2013   CO2 31 08/23/2013   Lab Results  Component Value Date   ALT 18 08/23/2013   AST 17 08/23/2013   ALKPHOS 43 08/23/2013   BILITOT 0.4 08/23/2013   Lab Results  Component Value Date   CHOL 191 05/03/2013   Lab Results  Component Value Date   HDL 49 05/03/2013   Lab Results  Component Value Date    LDLCALC 101* 05/03/2013   Lab Results  Component Value Date   TRIG 203* 05/03/2013   Lab Results  Component Value Date   CHOLHDL 3.9 05/03/2013     Assessment & Plan  Nausea May continue promethazine, encouraged ginger prn, check H Pylori.  Urinary frequency Urine culture unremarkable.  Atypical chest pain Referred to cardiology for further consideration likely GI related but patient reports some chest pressure with stress and sob. Will r/o cardiac cause.  Oral lesion Patient reports seeing an oral surgeon and being told she has shingles in her mouth, no evidence of lesions today

## 2013-08-26 NOTE — Assessment & Plan Note (Addendum)
May continue promethazine, encouraged ginger prn, check H Pylori.

## 2013-08-26 NOTE — Assessment & Plan Note (Signed)
Urine culture unremarkable.

## 2013-08-26 NOTE — Assessment & Plan Note (Signed)
Referred to cardiology for further consideration likely GI related but patient reports some chest pressure with stress and sob. Will r/o cardiac cause.

## 2013-08-27 ENCOUNTER — Other Ambulatory Visit: Payer: Self-pay | Admitting: Family Medicine

## 2013-08-27 DIAGNOSIS — R110 Nausea: Secondary | ICD-10-CM

## 2013-08-27 DIAGNOSIS — J209 Acute bronchitis, unspecified: Secondary | ICD-10-CM

## 2013-08-27 LAB — H. PYLORI ANTIBODY, IGG: H Pylori IgG: 0.43 {ISR}

## 2013-08-27 MED ORDER — PROMETHAZINE HCL 25 MG PO TABS: 25.0000 mg | ORAL_TABLET | Freq: Three times a day (TID) | ORAL | Status: DC | PRN

## 2013-08-29 ENCOUNTER — Ambulatory Visit
Admission: RE | Admit: 2013-08-29 | Discharge: 2013-08-29 | Disposition: A | Discharge status: Home / Self Care | Payer: BC Managed Care – PPO | Source: Ambulatory Visit

## 2013-08-29 DIAGNOSIS — Z1231 Encounter for screening mammogram for malignant neoplasm of breast: Secondary | ICD-10-CM

## 2013-08-30 NOTE — Telephone Encounter (Signed)
Notes Recorded by Earnstine Regal, MA on 08/30/2013 at 2:54 PM Called pt again still no answer LMOM with md response.Marland KitchenJohny Chess ------ Notes Recorded by Varney Daily, RMA on 08/29/2013 at 3:35 PM Left a message for patient to return my call ------ Notes Recorded by Varney Daily, RMA on 08/29/2013 at 3:34 PM Left a message for patient to return my call ------ Notes Recorded by Mosie Lukes, MD on 08/27/2013 at 3:25 PM If she is still uncomfortable I will refer to Gastroenterology ------ Notes Recorded by Mosie Lukes, MD on 08/27/2013 at 3:25 PM Notify h pylori negative

## 2013-09-06 ENCOUNTER — Institutional Professional Consult (permissible substitution): Payer: BC Managed Care – PPO | Admitting: Cardiovascular Disease

## 2013-09-24 ENCOUNTER — Ambulatory Visit: Payer: BC Managed Care – PPO | Admitting: Family Medicine

## 2013-10-02 ENCOUNTER — Ambulatory Visit (INDEPENDENT_AMBULATORY_CARE_PROVIDER_SITE_OTHER): Payer: BC Managed Care – PPO | Admitting: Cardiovascular Disease

## 2013-10-02 ENCOUNTER — Encounter: Payer: Self-pay | Admitting: Cardiovascular Disease

## 2013-10-02 VITALS — BP 106/68 | HR 80 | Ht 65.0 in | Wt 191.8 lb

## 2013-10-02 DIAGNOSIS — R609 Edema, unspecified: Secondary | ICD-10-CM

## 2013-10-02 DIAGNOSIS — E785 Hyperlipidemia, unspecified: Secondary | ICD-10-CM

## 2013-10-02 DIAGNOSIS — R600 Localized edema: Secondary | ICD-10-CM

## 2013-10-02 DIAGNOSIS — R0789 Other chest pain: Secondary | ICD-10-CM

## 2013-10-02 NOTE — Progress Notes (Signed)
Vicki Mallet Guarnieri Date of Birth  1955/11/24       [facility] [address], [place], [place]  [address]   [place], [place]  [address] [place]   Fax  [phone]     Fax [phone]  Problem List: 1. Chest tightness  History of Present Illness:  Faithanne is a 58 yo who developed chest tightness associated with bronchitis in December ( 3 months ago)  .  She ws found to have a heart murmur. She's also had some leg swelling.   Her medical doctor prescribed lasix and kdur for this leg swelling.  She avoids eating any extra salt.   She walks her dog about 10 minutes each day.  She has symptoms of GERD - occurs at any time - can occur before or after she eats.    She has been diagnosed with  Lymphedema.    Current Outpatient Prescriptions on File Prior to Visit  Medication Sig Dispense Refill  . albuterol (PROVENTIL HFA;VENTOLIN HFA) 108 (90 BASE) MCG/ACT inhaler Inhale 2 puffs into the lungs every 6 (six) hours as needed for wheezing or shortness of breath.  1 Inhaler  2  . baclofen (LIORESAL) 20 MG tablet Take 1 tablet (20 mg total) by mouth 4 (four) times daily as needed for muscle spasms.  120 each  1  . BIOTIN PO Take by mouth.      . Calcium Carbonate-Vitamin D (CALTRATE 600+D) 600-400 MG-UNIT per tablet Take 1 tablet by mouth daily.       . Cholecalciferol (VITAMIN D3) 1000 UNITS CAPS Take by mouth.        . conjugated estrogens (PREMARIN) vaginal cream Place vaginally as directed. 3 times a week at bedtime      . diazepam (VALIUM) 10 MG tablet Take 1 tablet (10 mg total) by mouth daily as needed for anxiety.  40 tablet  0  . FLAXSEED, LINSEED, PO Take by mouth every other day.       . fluticasone (FLONASE) 50 MCG/ACT nasal spray Place 2 sprays into both nostrils daily.  16 g  6  . furosemide (LASIX) 40 MG tablet 1 tab po daily and a 2 nd tab prn increased edema  135 tablet  1  .  lamoTRIgine (LAMICTAL) 200 MG tablet Take 200 mg by mouth daily.        . Nutritional Supplements (ESTROVEN PO) Take by mouth.      . pantoprazole (PROTONIX) 40 MG tablet Take 1 tablet (40 mg total) by mouth daily.  30 tablet  3  . potassium chloride SA (K-DUR,KLOR-CON) 20 MEQ tablet 1 tab daily and a second tab daily as needed for edema, sob,  135 tablet  1  . promethazine (PHENERGAN) 25 MG tablet Take 1 tablet (25 mg total) by mouth every 8 (eight) hours as needed for nausea.  40 tablet  2  . rOPINIRole (REQUIP) 1 MG tablet Take 1 mg by mouth daily.        . simvastatin (ZOCOR) 20 MG tablet TAKE 1 TABLET BY MOUTH ATBEDTIME  90 tablet  1  . temazepam (RESTORIL) 15 MG capsule Take 1 capsule (15 mg total) by mouth at bedtime as needed for sleep.  30 capsule  0  . traMADol (ULTRAM) 50 MG tablet Take 2 tablets (100 mg total) by mouth every 8 (eight) hours as needed for moderate  pain or severe pain.  120 tablet  0  . venlafaxine XR (EFFEXOR-XR) 150 MG 24 hr capsule Take 225 mg by mouth daily with breakfast.      . omeprazole (PRILOSEC) 40 MG capsule Take 40 mg by mouth daily.       No current facility-administered medications on file prior to visit.    Allergies  Allergen Reactions  . Ceclor [Cefaclor]     Tongue swell    Past Medical History  Diagnosis Date  . Allergy     seasonal  . Anxiety   . Depression   . Hyperlipidemia   . History of proteinuria syndrome   . Edema extremities   . RLS (restless legs syndrome)   . Dyspareunia   . Obese   . Onychomycosis 05/09/2011  . Low back pain 05/09/2011  . RUQ pain 09/20/2011  . Hip pain, left 11/01/2011  . Lymphadenitis 06/16/2012  . Otitis externa 06/23/2012    left  . GERD (gastroesophageal reflux disease)   . Insomnia 03/11/2013  . Perimenopause 03/11/2013  . Acute sinusitis 03/27/2013  . Unspecified constipation 05/13/2013  . Arthritis 06/09/2013  . Urinary frequency 08/26/2013  . Oral lesion 08/26/2013    Past Surgical History    Procedure Laterality Date  . Cesarean section  1993  . Back surgery  2006    low, discectomy for herniated disc  . Tubal ligation  1993  . Carpal tunnel release Right 2006  . Bladder suspension N/A 11/07/2012    Procedure: [facility];  Surgeon: Malka So, MD;  Location: [facility];  Service: Urology;  Laterality: N/A;  . Cystoscopy N/A 11/07/2012    Procedure: CYSTOSCOPY;  Surgeon: Malka So, MD;  Location: [facility];  Service: Urology;  Laterality: N/A;    History  Smoking status  . Former Smoker -- 0.50 packs/day for 20 years  . Types: Cigarettes  . Quit date: 07/12/1992  Smokeless tobacco  . Never Used    History  Alcohol Use  . Yes    Comment: occasionaly    Family History  Problem Relation Age of Onset  . Cancer Mother 34    thyroid  . Arthritis Mother     nreck, back  . Hepatitis Sister     Hepatitis C  . Diabetes Maternal Grandmother     Reviw of Systems:  Reviewed in the HPI.  All other systems are negative.  Physical Exam: Blood pressure 106/68, pulse 80, height 5\' 5"  (1.651 m), weight 191 lb 12.8 oz (87 kg), last menstrual period 10/03/2012. Wt Readings from Last 3 Encounters:  10/02/13 191 lb 12.8 oz (87 kg)  08/23/13 185 lb 1.3 oz (83.952 kg)  07/10/13 177 lb 0.6 oz (80.305 kg)     General: Well developed, well nourished, in no acute distress.  Head: Normocephalic, atraumatic, sclera non-icteric, mucus membranes are moist,   Neck: Supple. Carotids are 2 + without bruits. No JVD  Lungs: Clear  Heart: RR, S1S2,  No significant murmur Abdomen: Soft, non-tender, non-distended with normal bowel sounds. Msk:  Strength and tone are normal  Extremities: No clubbing or cyanosis. No edema.  Distal pedal pulses are 2+ and equal  Neuro: CN II - XII intact.  Alert and oriented X 3.  Psych:  Normal   ECG: October 02, 2012:  NSR at 80.  NS ST abnormality.    Assessment / Plan:

## 2013-10-02 NOTE — Assessment & Plan Note (Signed)
[name] presents with episodes of chest discomfort. These episodes are somewhat atypical. She has some at various times and not necessarily with exertion. She thinks that they're related to gastroesophageal reflux but she cannot be sure.  She has some nonspecific ST changes on her EKG and I think it we should proceed with a stress Myoview study to rule out coronary artery disease.  We'll also get an echocardiogram for further evaluation of his chest pain and rule out pulmonary hypertension. She does have some leg edema. Of note, she also has lymphedema which seems to make her legs well.  Will see her one time in 3 months for followup visit. We will turn her back over to her medical Dr. if the above noted tests are normal.

## 2013-10-02 NOTE — Patient Instructions (Signed)
Your physician has requested that you have an echocardiogram. Echocardiography is a painless test that uses sound waves to create images of your heart. It provides your doctor with information about the size and shape of your heart and how well your heart's chambers and valves are working. This procedure takes approximately one hour. There are no restrictions for this procedure.  Your physician has requested that you have en exercise stress myoview. For further information please visit [email or web address]. Please follow instruction sheet, as given.   Your physician recommends that you schedule a follow-up appointment in: 3 MONTHS

## 2013-10-09 ENCOUNTER — Encounter: Payer: Self-pay | Admitting: Family Medicine

## 2013-10-09 ENCOUNTER — Ambulatory Visit (INDEPENDENT_AMBULATORY_CARE_PROVIDER_SITE_OTHER): Payer: BC Managed Care – PPO | Admitting: Family Medicine

## 2013-10-09 VITALS — BP 120/74 | HR 79 | Temp 97.8°F | Ht 65.0 in | Wt 193.0 lb

## 2013-10-09 DIAGNOSIS — M545 Low back pain, unspecified: Secondary | ICD-10-CM

## 2013-10-09 DIAGNOSIS — E785 Hyperlipidemia, unspecified: Secondary | ICD-10-CM

## 2013-10-09 DIAGNOSIS — R609 Edema, unspecified: Secondary | ICD-10-CM

## 2013-10-09 DIAGNOSIS — G4700 Insomnia, unspecified: Secondary | ICD-10-CM

## 2013-10-09 DIAGNOSIS — M549 Dorsalgia, unspecified: Secondary | ICD-10-CM

## 2013-10-09 DIAGNOSIS — Z111 Encounter for screening for respiratory tuberculosis: Secondary | ICD-10-CM

## 2013-10-09 DIAGNOSIS — K5900 Constipation, unspecified: Secondary | ICD-10-CM

## 2013-10-09 DIAGNOSIS — R0789 Other chest pain: Secondary | ICD-10-CM

## 2013-10-09 MED ORDER — DIAZEPAM 10 MG PO TABS: 10.0000 mg | ORAL_TABLET | Freq: Every day | ORAL | Status: DC | PRN

## 2013-10-09 MED ORDER — TEMAZEPAM 15 MG PO CAPS: 15.0000 mg | ORAL_CAPSULE | Freq: Every evening | ORAL | Status: DC | PRN

## 2013-10-09 MED ORDER — TRAMADOL HCL 50 MG PO TABS: 100.0000 mg | ORAL_TABLET | Freq: Three times a day (TID) | ORAL | Status: DC | PRN

## 2013-10-09 MED ORDER — FUROSEMIDE 40 MG PO TABS: ORAL_TABLET | ORAL | Status: DC

## 2013-10-09 NOTE — Patient Instructions (Signed)

## 2013-10-09 NOTE — Progress Notes (Signed)
Pre visit review using our clinic review tool, if applicable. No additional management support is needed unless otherwise documented below in the visit note. 

## 2013-10-10 ENCOUNTER — Encounter: Payer: Self-pay | Admitting: Family Medicine

## 2013-10-10 NOTE — Addendum Note (Signed)
Addended by: Penni Homans A on: 10/10/2013 10:21 PM   Modules accepted: Medications

## 2013-10-10 NOTE — Assessment & Plan Note (Signed)
Tolerating statin, encouraged heart healthy diet, avoid trans fats, minimize simple carbs and saturated fats. Increase exercise as tolerated 

## 2013-10-10 NOTE — Progress Notes (Addendum)
Patient ID: SADEE OSLAND, female   DOB: 1956/03/27, 58 y.o.   MRN: [id number] BREIONA COUVILLON [id number] 1955/10/03 10/10/2013      Progress Note-Follow Up  Subjective  Chief Complaint  Chief Complaint  Patient presents with  . Follow-up    4 week  . Injections    ppd    HPI  Patient is a 58 year old female in today for routine medical care. He is doing somewhat better. Is not having significant chest pain at this time but is going to proceed with cardiac testing with cardiology as per caution. No major illness since her last visit. Continues to struggle with chronic diffuse pain also complains of increased bruisability since her last visit. Denies palp/SOB/HA/congestion/fevers/GI or GU c/o. Taking meds as prescribed  Past Medical History  Diagnosis Date  . Allergy     seasonal  . Anxiety   . Depression   . Hyperlipidemia   . History of proteinuria syndrome   . Edema extremities   . RLS (restless legs syndrome)   . Dyspareunia   . Obese   . Onychomycosis 05/09/2011  . Low back pain 05/09/2011  . RUQ pain 09/20/2011  . Hip pain, left 11/01/2011  . Lymphadenitis 06/16/2012  . Otitis externa 06/23/2012    left  . GERD (gastroesophageal reflux disease)   . Insomnia 03/11/2013  . Perimenopause 03/11/2013  . Acute sinusitis 03/27/2013  . Unspecified constipation 05/13/2013  . Arthritis 06/09/2013  . Urinary frequency 08/26/2013  . Oral lesion 08/26/2013    Past Surgical History  Procedure Laterality Date  . Cesarean section  1993  . Back surgery  2006    low, discectomy for herniated disc  . Tubal ligation  1993  . Carpal tunnel release Right 2006  . Bladder suspension N/A 11/07/2012    Procedure: [facility];  Surgeon: Malka So, MD;  Location: [facility];  Service: Urology;  Laterality: N/A;  . Cystoscopy N/A 11/07/2012    Procedure: CYSTOSCOPY;  Surgeon: Malka So, MD;  Location: [facility];  Service: Urology;  Laterality: N/A;     Family History  Problem Relation Age of Onset  . Cancer Mother 86    thyroid  . Arthritis Mother     nreck, back  . Hepatitis Sister     Hepatitis C  . Diabetes Maternal Grandmother     History   Social History  . Marital Status: Married    Spouse Name: N/A    Number of Children: N/A  . Years of Education: N/A   Occupational History  . Not on file.   Social History Main Topics  . Smoking status: Former Smoker -- 0.50 packs/day for 20 years    Types: Cigarettes    Quit date: 07/12/1992  . Smokeless tobacco: Never Used  . Alcohol Use: Yes     Comment: occasionaly  . Drug Use: No  . Sexual Activity: Yes    Partners: Male   Other Topics Concern  . Not on file   Social History Narrative  . No narrative on file    Current Outpatient Prescriptions on File Prior to Visit  Medication Sig Dispense Refill  . albuterol (PROVENTIL HFA;VENTOLIN HFA) 108 (90 BASE) MCG/ACT inhaler Inhale 2 puffs into the lungs every 6 (six) hours as needed for wheezing or shortness of breath.  1 Inhaler  2  . baclofen (LIORESAL) 20 MG tablet Take 1 tablet (20 mg total) by mouth 4 (four) times  daily as needed for muscle spasms.  120 each  1  . BIOTIN PO Take by mouth.      . Calcium Carbonate-Vitamin D (CALTRATE 600+D) 600-400 MG-UNIT per tablet Take 1 tablet by mouth daily.       . carbamazepine (EQUETRO) 200 MG CP12 12 hr capsule Take 200 mg by mouth.      . Cholecalciferol (VITAMIN D3) 1000 UNITS CAPS Take by mouth.        . conjugated estrogens (PREMARIN) vaginal cream Place vaginally as directed. 3 times a week at bedtime      . FLAXSEED, LINSEED, PO Take by mouth every other day.       . fluticasone (FLONASE) 50 MCG/ACT nasal spray Place 2 sprays into both nostrils daily.  16 g  6  . lamoTRIgine (LAMICTAL) 200 MG tablet Take 200 mg by mouth daily.        . Nutritional Supplements (ESTROVEN PO) Take by mouth.      . pantoprazole (PROTONIX) 40 MG tablet Take 1 tablet (40 mg total) by  mouth daily.  30 tablet  3  . potassium chloride SA (K-DUR,KLOR-CON) 20 MEQ tablet 1 tab daily and a second tab daily as needed for edema, sob,  135 tablet  1  . promethazine (PHENERGAN) 25 MG tablet Take 1 tablet (25 mg total) by mouth every 8 (eight) hours as needed for nausea.  40 tablet  2  . rOPINIRole (REQUIP) 1 MG tablet Take 1 mg by mouth daily.        . simvastatin (ZOCOR) 20 MG tablet TAKE 1 TABLET BY MOUTH ATBEDTIME  90 tablet  1  . venlafaxine XR (EFFEXOR-XR) 150 MG 24 hr capsule Take 225 mg by mouth daily with breakfast.      . omeprazole (PRILOSEC) 40 MG capsule Take 40 mg by mouth daily.       No current facility-administered medications on file prior to visit.    Allergies  Allergen Reactions  . Ceclor [Cefaclor]     Tongue swell    Review of Systems  Review of Systems  Constitutional: Positive for malaise/fatigue. Negative for fever.  HENT: Negative for congestion.   Eyes: Negative for discharge.  Respiratory: Negative for shortness of breath.   Cardiovascular: Negative for chest pain, palpitations and leg swelling.  Gastrointestinal: Negative for nausea, abdominal pain and diarrhea.  Genitourinary: Negative for dysuria.  Musculoskeletal: Positive for back pain. Negative for falls.  Skin: Negative for rash.  Neurological: Negative for loss of consciousness and headaches.  Endo/Heme/Allergies: Negative for polydipsia.  Psychiatric/Behavioral: Negative for depression and suicidal ideas. The patient is nervous/anxious. The patient does not have insomnia.     Objective  BP 120/74  Pulse 79  Temp(Src) 97.8 F (36.6 C) (Oral)  Ht 5\' 5"  (1.651 m)  Wt 193 lb 0.6 oz (87.562 kg)  BMI 32.12 kg/m2  SpO2 98%  LMP 10/03/2012  Physical Exam  Physical Exam  Constitutional: She is oriented to person, place, and time and well-developed, well-nourished, and in no distress. No distress.  HENT:  Head: Normocephalic and atraumatic.  Eyes: Conjunctivae are normal.   Neck: Neck supple. No thyromegaly present.  Cardiovascular: Normal rate, regular rhythm and normal heart sounds.   No murmur heard. Pulmonary/Chest: Effort normal and breath sounds normal. She has no wheezes.  Abdominal: She exhibits no distension and no mass.  Musculoskeletal: She exhibits no edema.  Lymphadenopathy:    She has no cervical adenopathy.  Neurological: She is alert  and oriented to person, place, and time.  Skin: Skin is warm and dry. No rash noted. She is not diaphoretic.  Psychiatric: Memory, affect and judgment normal.    Lab Results  Component Value Date   TSH 1.574 08/23/2013   Lab Results  Component Value Date   WBC 6.7 08/23/2013   HGB 13.5 08/23/2013   HCT 40.0 08/23/2013   MCV 86.8 08/23/2013   PLT 267 08/23/2013   Lab Results  Component Value Date   CREATININE 0.83 08/23/2013   BUN 19 08/23/2013   NA 142 08/23/2013   K 4.0 08/23/2013   CL 98 08/23/2013   CO2 31 08/23/2013   Lab Results  Component Value Date   ALT 18 08/23/2013   AST 17 08/23/2013   ALKPHOS 43 08/23/2013   BILITOT 0.4 08/23/2013   Lab Results  Component Value Date   CHOL 191 05/03/2013   Lab Results  Component Value Date   HDL 49 05/03/2013   Lab Results  Component Value Date   LDLCALC 101* 05/03/2013   Lab Results  Component Value Date   TRIG 203* 05/03/2013   Lab Results  Component Value Date   CHOLHDL 3.9 05/03/2013     Assessment & Plan  Unspecified constipation Encouraged increased hydration and fiber in diet. Daily probiotics. If bowels not moving can use MOM 2 tbls po in 4 oz of warm prune juice by mouth every 2-3 days. If no results then repeat in 4 hours with  Dulcolax suppository pr, may repeat again in 4 more hours as needed. Seek care if symptoms worsen. Consider daily Miralax and/or Dulcolax if symptoms persist.   Atypical chest pain Is feeling better and will proceed with cardiac testing and assuming that is normal we will address if  recurs  Insomnia Encouraged good sleep hygiene such as dark, quiet room. No blue/green glowing lights such as computer screens in bedroom. No alcohol or stimulants in evening. Cut down on caffeine as able. Regular exercise is helpful but not just prior to bed time.   Hyperlipidemia Tolerating statin, encouraged heart healthy diet, avoid trans fats, minimize simple carbs and saturated fats. Increase exercise as tolerated

## 2013-10-10 NOTE — Assessment & Plan Note (Signed)
Encouraged good sleep hygiene such as dark, quiet room. No blue/green glowing lights such as computer screens in bedroom. No alcohol or stimulants in evening. Cut down on caffeine as able. Regular exercise is helpful but not just prior to bed time.  

## 2013-10-10 NOTE — Assessment & Plan Note (Signed)
Is feeling better and will proceed with cardiac testing and assuming that is normal we will address if recurs

## 2013-10-10 NOTE — Assessment & Plan Note (Signed)
Encouraged increased hydration and fiber in diet. Daily probiotics. If bowels not moving can use MOM 2 tbls po in 4 oz of warm prune juice by mouth every 2-3 days. If no results then repeat in 4 hours with  Dulcolax suppository pr, may repeat again in 4 more hours as needed. Seek care if symptoms worsen. Consider daily Miralax and/or Dulcolax if symptoms persist.  

## 2013-10-11 ENCOUNTER — Encounter: Payer: Self-pay | Admitting: Family Medicine

## 2013-10-11 LAB — TB SKIN TEST: TB SKIN TEST: NEGATIVE

## 2013-10-18 ENCOUNTER — Other Ambulatory Visit (HOSPITAL_COMMUNITY): Payer: BC Managed Care – PPO

## 2013-10-18 ENCOUNTER — Encounter: Payer: Self-pay | Admitting: Cardiology

## 2013-10-18 ENCOUNTER — Ambulatory Visit (HOSPITAL_BASED_OUTPATIENT_CLINIC_OR_DEPARTMENT_OTHER): Payer: BC Managed Care – PPO | Admitting: Radiology

## 2013-10-18 ENCOUNTER — Ambulatory Visit (HOSPITAL_COMMUNITY): Payer: BC Managed Care – PPO | Attending: Cardiology | Admitting: Radiology

## 2013-10-18 VITALS — BP 131/65 | Ht 65.0 in | Wt 195.0 lb

## 2013-10-18 DIAGNOSIS — R072 Precordial pain: Secondary | ICD-10-CM

## 2013-10-18 DIAGNOSIS — R079 Chest pain, unspecified: Secondary | ICD-10-CM | POA: Insufficient documentation

## 2013-10-18 DIAGNOSIS — R5381 Other malaise: Secondary | ICD-10-CM | POA: Insufficient documentation

## 2013-10-18 DIAGNOSIS — Z87891 Personal history of nicotine dependence: Secondary | ICD-10-CM | POA: Insufficient documentation

## 2013-10-18 DIAGNOSIS — R0789 Other chest pain: Secondary | ICD-10-CM

## 2013-10-18 DIAGNOSIS — R0602 Shortness of breath: Secondary | ICD-10-CM

## 2013-10-18 DIAGNOSIS — Z8249 Family history of ischemic heart disease and other diseases of the circulatory system: Secondary | ICD-10-CM | POA: Insufficient documentation

## 2013-10-18 DIAGNOSIS — R9431 Abnormal electrocardiogram [ECG] [EKG]: Secondary | ICD-10-CM | POA: Insufficient documentation

## 2013-10-18 DIAGNOSIS — R600 Localized edema: Secondary | ICD-10-CM

## 2013-10-18 DIAGNOSIS — R0609 Other forms of dyspnea: Secondary | ICD-10-CM | POA: Insufficient documentation

## 2013-10-18 DIAGNOSIS — R0989 Other specified symptoms and signs involving the circulatory and respiratory systems: Secondary | ICD-10-CM | POA: Insufficient documentation

## 2013-10-18 DIAGNOSIS — R5383 Other fatigue: Secondary | ICD-10-CM

## 2013-10-18 DIAGNOSIS — R42 Dizziness and giddiness: Secondary | ICD-10-CM | POA: Insufficient documentation

## 2013-10-18 MED ORDER — REGADENOSON 0.4 MG/5ML IV SOLN
0.4000 mg | Freq: Once | INTRAVENOUS | Status: AC
  Administered 2013-10-18: 0.4 mg via INTRAVENOUS

## 2013-10-18 MED ORDER — TECHNETIUM TC 99M SESTAMIBI GENERIC - CARDIOLITE
33.0000 | Freq: Once | INTRAVENOUS | Status: AC | PRN
  Administered 2013-10-18: 33 via INTRAVENOUS

## 2013-10-18 MED ORDER — TECHNETIUM TC 99M SESTAMIBI GENERIC - CARDIOLITE
11.0000 | Freq: Once | INTRAVENOUS | Status: AC | PRN
  Administered 2013-10-18: 11 via INTRAVENOUS

## 2013-10-18 NOTE — Progress Notes (Signed)
[facility] [address] [place], [place] [address] [phone]    Cardiology Nuclear Med Study  [name] is a 58 y.o. female     MRN : [id number]     DOB: February 21, 1956  Procedure Date: 10/18/2013  Nuclear Med Background Indication for Stress Test:  Evaluation for Ischemia and Abnormal EKG History:  2013 Echo EF 55-65% Cardiac Risk Factors: Family History - CAD, History of Smoking and Lipids  Symptoms:  Chest Pain, Dizziness and DOE   Nuclear Pre-Procedure Caffeine/Decaff Intake:  None > 12 hrs NPO After: 8:00am   Lungs:  clear O2 Sat: 95% on room air. IV 0.9% NS with Angio Cath:  20g  IV Site: R Antecubital x 1, tolerated well IV Started by:  Irven Baltimore, RN  Chest Size (in):  34 Cup Size: D  Height: 5\' 5"  (1.651 m)  Weight:  195 lb (88.451 kg)  BMI:  Body mass index is 32.45 kg/(m^2). Tech Comments:  N/A    Nuclear Med Study 1 or 2 day study: 1 day  Stress Test Type:  Stress  Reading MD: N/A  Order Authorizing Provider:  Mertie Moores, MD  Resting Radionuclide: Technetium 26m Sestamibi  Resting Radionuclide Dose: 11.0 mCi   Stress Radionuclide:  Technetium 38m Sestamibi  Stress Radionuclide Dose: 33.0 mCi           Stress Protocol Rest HR: 73 Stress HR: 118  Rest BP: 131/65 Stress BP: 142/64  Exercise Time (min): 6:02 METS: 6.2   Predicted Max HR: 165 bpm % Max HR: 71.52 bpm Rate Pressure Product: 16756   Dose of Adenosine (mg):  n/a Dose of Lexiscan: 0.4 mg  Dose of Atropine (mg): n/a Dose of Dobutamine: n/a mcg/kg/min (at max HR)  Stress Test Technologist: Crissie Figures, RN  Nuclear Technologist:  Charlton Amor, CNMT     Rest Procedure:  Myocardial perfusion imaging was performed at rest 45 minutes following the intravenous administration of Technetium 57m Sestamibi. Rest ECG: NSR nonspecific ST/T wave changes  Stress Procedure:  The patient attempted to walk the treadmill utilizing the Bruce protocol. She was unable to  achieve target heart due to dyspnea and fatigue and was changed to a walking Lexiscan. The patient received IV Lexiscan 0.4 mg over 15-seconds with concurrent low level exercise and then Technetium 67m Sestamibi was injected at 30-seconds while the patient continued walking one more minute.  Quantitative spect images were obtained after a 45-minute delay. Stress ECG: No significant change from baseline ECG  QPS Raw Data Images:  Patient motion noted. Stress Images:  Normal homogeneous uptake in all areas of the myocardium. Rest Images:  Normal homogeneous uptake in all areas of the myocardium. Subtraction (SDS):  No evidence of ischemia. Transient Ischemic Dilatation (Normal <1.22):  0.91 Lung/Heart Ratio (Normal <0.45):  0.42  Quantitative Gated Spect Images QGS EDV:  87 ml QGS ESV:  29 ml  Impression Exercise Capacity:  Lexiscan with low level exercise. BP Response:  Normal blood pressure response. Clinical Symptoms:  Fatigue ECG Impression:  No significant ST segment change suggestive of ischemia. Comparison with Prior Nuclear Study: No images to compare  Overall Impression:  Normal stress nuclear study.  LV Ejection Fraction: 67%.  LV Wall Motion:  NL LV Function; NL Wall Motion  Josue Hector

## 2013-10-18 NOTE — Progress Notes (Signed)
Echocardiogram Performed. 

## 2013-12-18 ENCOUNTER — Telehealth: Payer: Self-pay

## 2013-12-18 NOTE — Telephone Encounter (Signed)
We received paperwork for Disability Determination Services.  Paperwork on [employer]

## 2013-12-19 ENCOUNTER — Encounter: Payer: Self-pay | Admitting: Cardiovascular Disease

## 2013-12-19 ENCOUNTER — Ambulatory Visit (INDEPENDENT_AMBULATORY_CARE_PROVIDER_SITE_OTHER): Payer: BC Managed Care – PPO | Admitting: Cardiovascular Disease

## 2013-12-19 VITALS — BP 100/74 | HR 80 | Ht 65.0 in | Wt 204.4 lb

## 2013-12-19 DIAGNOSIS — I359 Nonrheumatic aortic valve disorder, unspecified: Secondary | ICD-10-CM

## 2013-12-19 DIAGNOSIS — R0789 Other chest pain: Secondary | ICD-10-CM

## 2013-12-19 DIAGNOSIS — I351 Nonrheumatic aortic (valve) insufficiency: Secondary | ICD-10-CM

## 2013-12-19 NOTE — Patient Instructions (Signed)
Your physician recommends that you schedule a follow-up appointment in: AS NEEDED  Your physician recommends that you continue on your current medications as directed. Please refer to the Current Medication list given to you today.  

## 2013-12-19 NOTE — Assessment & Plan Note (Signed)
Her Myoview study was normal.  She is not having any further episodes of chest discomfort.

## 2013-12-19 NOTE — Assessment & Plan Note (Signed)
She is to have mild to moderate aortic insufficiency by echo. I was unable to hear any significant murmur today. I doubt that this aortic valve regurgitation will progress but did suggest that she has an echocardiogram in about 5-10 years.   Will have her medical doctor order this when needed.   We will want to form an echo sooner if she were to have worsening shortness of breath.  She needs to work on a diet and exercise program.  I think this will be very helpful for her in the future.

## 2013-12-19 NOTE — Progress Notes (Signed)
[name] Date of Birth  1956/06/07       [facility] [address], [address], [place], [place]  [address]   [place], [place]  [address] [place]   Fax  [phone]     Fax [phone]  Problem List: 1. Chest tightness  History of Present Illness:  [name] is a 58 yo who developed chest tightness associated with bronchitis in December ( 3 months ago)  .  She ws found to have a heart murmur. She's also had some leg swelling.   Her medical doctor prescribed lasix and kdur for this leg swelling.  She avoids eating any extra salt.   She walks her dog about 10 minutes each day.  She has symptoms of GERD - occurs at any time - can occur before or after she eats.    She has been diagnosed with  Lymphedema.    December 19, 2013:  [name] was seen for atypical CP several months ago.  She also has lymphedema Echo reveals: Left ventricle: The cavity size was normal. Wall thickness was normal. Systolic function was normal. The estimated ejection fraction was in the range of 55% to 60%. - Aortic valve: Mild to moderate regurgitation. - Mitral valve: Mild regurgitation. - Atrial septum: No defect or patent foramen ovale was Identified.  Stress myoview revealed no ischemia.  Her LV function is normal with EF of 67%.     Current Outpatient Prescriptions on File Prior to Visit  Medication Sig Dispense Refill  . albuterol (PROVENTIL HFA;VENTOLIN HFA) 108 (90 BASE) MCG/ACT inhaler Inhale 2 puffs into the lungs every 6 (six) hours as needed for wheezing or shortness of breath.  1 Inhaler  2  . baclofen (LIORESAL) 20 MG tablet Take 1 tablet (20 mg total) by mouth 4 (four) times daily as needed for muscle spasms.  120 each  1  . BIOTIN PO Take by mouth.      . Calcium Carbonate-Vitamin D (CALTRATE 600+D) 600-400 MG-UNIT per tablet Take 1 tablet by mouth daily.       . carbamazepine (EQUETRO) 200 MG CP12 12  hr capsule Take 200 mg by mouth.      . Cholecalciferol (VITAMIN D3) 1000 UNITS CAPS Take by mouth.        . conjugated estrogens (PREMARIN) vaginal cream Place vaginally as directed. 3 times a week at bedtime      . diazepam (VALIUM) 10 MG tablet Take 1 tablet (10 mg total) by mouth daily as needed for anxiety.  40 tablet  1  . FLAXSEED, LINSEED, PO Take by mouth every other day.       . fluticasone (FLONASE) 50 MCG/ACT nasal spray Place 2 sprays into both nostrils daily.  16 g  6  . furosemide (LASIX) 40 MG tablet 1 tab po daily and a 2 nd tab prn increased edema  135 tablet  1  . lamoTRIgine (LAMICTAL) 200 MG tablet Take 200 mg by mouth daily.        . Nutritional Supplements (ESTROVEN PO) Take by mouth.      Marland Kitchen omeprazole (PRILOSEC) 40 MG capsule Take 40 mg by mouth daily.      . pantoprazole (PROTONIX) 40 MG tablet Take 1 tablet (40 mg total) by mouth daily.  30 tablet  3  . potassium chloride SA (K-DUR,KLOR-CON) 20 MEQ tablet 1 tab daily and a  second tab daily as needed for edema, sob,  135 tablet  1  . promethazine (PHENERGAN) 25 MG tablet Take 1 tablet (25 mg total) by mouth every 8 (eight) hours as needed for nausea.  40 tablet  2  . rOPINIRole (REQUIP) 1 MG tablet Take 1 mg by mouth daily.        . simvastatin (ZOCOR) 20 MG tablet TAKE 1 TABLET BY MOUTH ATBEDTIME  90 tablet  1  . temazepam (RESTORIL) 15 MG capsule Take 1 capsule (15 mg total) by mouth at bedtime as needed for sleep.  30 capsule  1  . traMADol (ULTRAM) 50 MG tablet Take 2 tablets (100 mg total) by mouth every 8 (eight) hours as needed for moderate pain or severe pain.  120 tablet  0  . venlafaxine XR (EFFEXOR-XR) 150 MG 24 hr capsule Take 225 mg by mouth daily with breakfast.       No current facility-administered medications on file prior to visit.    Allergies  Allergen Reactions  . Carbamazepine Hives  . Ceclor [Cefaclor]     Tongue swell    Past Medical History  Diagnosis Date  . Allergy     seasonal  .  Anxiety   . Depression   . Hyperlipidemia   . History of proteinuria syndrome   . Edema extremities   . RLS (restless legs syndrome)   . Dyspareunia   . Obese   . Onychomycosis 05/09/2011  . Low back pain 05/09/2011  . RUQ pain 09/20/2011  . Hip pain, left 11/01/2011  . Lymphadenitis 06/16/2012  . Otitis externa 06/23/2012    left  . GERD (gastroesophageal reflux disease)   . Insomnia 03/11/2013  . Perimenopause 03/11/2013  . Acute sinusitis 03/27/2013  . Unspecified constipation 05/13/2013  . Arthritis 06/09/2013  . Urinary frequency 08/26/2013  . Oral lesion 08/26/2013    Past Surgical History  Procedure Laterality Date  . Cesarean section  1993  . Back surgery  2006    low, discectomy for herniated disc  . Tubal ligation  1993  . Carpal tunnel release Right 2006  . Bladder suspension N/A 11/07/2012    Procedure: [facility] SLING;  Surgeon: Malka So, MD;  Location: [facility];  Service: Urology;  Laterality: N/A;  . Cystoscopy N/A 11/07/2012    Procedure: CYSTOSCOPY;  Surgeon: Malka So, MD;  Location: [facility];  Service: Urology;  Laterality: N/A;    History  Smoking status  . Former Smoker -- 0.50 packs/day for 20 years  . Types: Cigarettes  . Quit date: 07/12/1992  Smokeless tobacco  . Never Used    History  Alcohol Use  . Yes    Comment: occasionaly    Family History  Problem Relation Age of Onset  . Cancer Mother 30    thyroid  . Arthritis Mother     nreck, back  . Hepatitis Sister     Hepatitis C  . Diabetes Maternal Grandmother     Reviw of Systems:  Reviewed in the HPI.  All other systems are negative.  Physical Exam: Blood pressure 100/74, pulse 80, height 5\' 5"  (1.651 m), weight 204 lb 6.4 oz (92.715 kg), last menstrual period 10/03/2012. Wt Readings from Last 3 Encounters:  12/19/13 204 lb 6.4 oz (92.715 kg)  10/18/13 195 lb (88.451 kg)  10/09/13 193 lb 0.6 oz (87.562 kg)     General: Well developed,  well nourished, in no acute distress.  Head:  Normocephalic, atraumatic, sclera non-icteric, mucus membranes are moist,   Neck: Supple. Carotids are 2 + without bruits. No JVD  Lungs: Clear  Heart: RR, S1S2,  No significant murmur Abdomen: Soft, non-tender, non-distended with normal bowel sounds. Msk:  Strength and tone are normal  Extremities: No clubbing or cyanosis. No edema.  Distal pedal pulses are 2+ and equal  Neuro: CN II - XII intact.  Alert and oriented X 3.  Psych:  Normal   ECG: October 02, 2012:  NSR at 80.  NS ST abnormality.    Assessment / Plan:

## 2013-12-20 ENCOUNTER — Ambulatory Visit (INDEPENDENT_AMBULATORY_CARE_PROVIDER_SITE_OTHER): Payer: BC Managed Care – PPO | Admitting: Family Medicine

## 2013-12-20 ENCOUNTER — Encounter: Payer: Self-pay | Admitting: Family Medicine

## 2013-12-20 VITALS — BP 100/68 | HR 92 | Temp 98.0°F | Ht 65.0 in | Wt 200.1 lb

## 2013-12-20 DIAGNOSIS — F418 Other specified anxiety disorders: Secondary | ICD-10-CM

## 2013-12-20 DIAGNOSIS — R110 Nausea: Secondary | ICD-10-CM

## 2013-12-20 DIAGNOSIS — J209 Acute bronchitis, unspecified: Secondary | ICD-10-CM

## 2013-12-20 DIAGNOSIS — E669 Obesity, unspecified: Secondary | ICD-10-CM

## 2013-12-20 DIAGNOSIS — R609 Edema, unspecified: Secondary | ICD-10-CM

## 2013-12-20 DIAGNOSIS — F341 Dysthymic disorder: Secondary | ICD-10-CM

## 2013-12-20 DIAGNOSIS — K219 Gastro-esophageal reflux disease without esophagitis: Secondary | ICD-10-CM

## 2013-12-20 DIAGNOSIS — E785 Hyperlipidemia, unspecified: Secondary | ICD-10-CM

## 2013-12-20 MED ORDER — SIMVASTATIN 20 MG PO TABS: 20.0000 mg | ORAL_TABLET | Freq: Every day | ORAL | Status: DC

## 2013-12-20 MED ORDER — FUROSEMIDE 40 MG PO TABS: ORAL_TABLET | ORAL | Status: DC

## 2013-12-20 MED ORDER — PROMETHAZINE HCL 25 MG PO TABS: 25.0000 mg | ORAL_TABLET | Freq: Three times a day (TID) | ORAL | Status: DC | PRN

## 2013-12-20 NOTE — Progress Notes (Signed)
Pre visit review using our clinic review tool, if applicable. No additional management support is needed unless otherwise documented below in the visit note. 

## 2013-12-20 NOTE — Patient Instructions (Signed)

## 2013-12-28 ENCOUNTER — Encounter: Payer: Self-pay | Admitting: Family Medicine

## 2013-12-28 NOTE — Assessment & Plan Note (Signed)
Encouraged DASH diet, decrease po intake and increase exercise as tolerated. Needs 7-8 hours of sleep nightly. Avoid trans fats, eat small, frequent meals every 4-5 hours with lean proteins, complex carbs and healthy fats. Minimize simple carbs, GMO foods. 

## 2013-12-28 NOTE — Assessment & Plan Note (Signed)
Avoid offending foods, start probiotics. Do not eat large meals in late evening and consider raising head of bed.  

## 2013-12-28 NOTE — Assessment & Plan Note (Addendum)
Continues to struggle but is following with psychiatry. Reports being diagnosed with ADD is considering meds

## 2013-12-28 NOTE — Progress Notes (Signed)
Patient ID: [name], female   DOB: August 06, 1955, 58 y.o.   MRN: [id number] [name] [id number] 26-Mar-1956 12/28/2013      Progress Note-Follow Up  Subjective  Chief Complaint  Chief Complaint  Patient presents with  . Follow-up    HPI  Patient is a 58 year old female in today for routine medical care. He is struggling still with her depression. He is frustrated with weight gain. 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 Has been diagnosed with ADD but has not started medications. No recent illness. Denies CP/palp/SOB/HA/congestion/fevers/GI or GU c/o. Taking meds as prescribed  Past Medical History  Diagnosis Date  . Allergy     seasonal  . Anxiety   . Depression   . Hyperlipidemia   . History of proteinuria syndrome   . Edema extremities   . RLS (restless legs syndrome)   . Dyspareunia   . Obese   . Onychomycosis 05/09/2011  . Low back pain 05/09/2011  . RUQ pain 09/20/2011  . Hip pain, left 11/01/2011  . Lymphadenitis 06/16/2012  . Otitis externa 06/23/2012    left  . GERD (gastroesophageal reflux disease)   . Insomnia 03/11/2013  . Perimenopause 03/11/2013  . Acute sinusitis 03/27/2013  . Unspecified constipation 05/13/2013  . Arthritis 06/09/2013  . Urinary frequency 08/26/2013  . Oral lesion 08/26/2013    Past Surgical History  Procedure Laterality Date  . Cesarean section  1993  . Back surgery  2006    low, discectomy for herniated disc  . Tubal ligation  1993  . Carpal tunnel release Right 2006  . Bladder suspension N/A 11/07/2012    Procedure: [facility] SLING;  Surgeon: Malka So, MD;  Location: [facility];  Service: Urology;  Laterality: N/A;  . Cystoscopy N/A 11/07/2012    Procedure: CYSTOSCOPY;  Surgeon: Malka So, MD;  Location: [facility];  Service: Urology;  Laterality: N/A;    Family History  Problem Relation Age of Onset  . Cancer Mother 23    thyroid  . Arthritis Mother     nreck,  back  . Hepatitis Sister     Hepatitis C  . Diabetes Maternal Grandmother     History   Social History  . Marital Status: Married    Spouse Name: N/A    Number of Children: N/A  . Years of Education: N/A   Occupational History  . Not on file.   Social History Main Topics  . Smoking status: Former Smoker -- 0.50 packs/day for 20 years    Types: Cigarettes    Quit date: 07/12/1992  . Smokeless tobacco: Never Used  . Alcohol Use: Yes     Comment: occasionaly  . Drug Use: No  . Sexual Activity: Yes    Partners: Male   Other Topics Concern  . Not on file   Social History Narrative  . No narrative on file    Current Outpatient Prescriptions on File Prior to Visit  Medication Sig Dispense Refill  . albuterol (PROVENTIL HFA;VENTOLIN HFA) 108 (90 BASE) MCG/ACT inhaler Inhale 2 puffs into the lungs every 6 (six) hours as needed for wheezing or shortness of breath.  1 Inhaler  2  . baclofen (LIORESAL) 20 MG tablet Take 1 tablet (20 mg total) by mouth 4 (four) times daily as needed for muscle spasms.  120 each  1  . BIOTIN PO Take by mouth.      . Calcium Carbonate-Vitamin D (CALTRATE  600+D) 600-400 MG-UNIT per tablet Take 1 tablet by mouth daily.       . carbamazepine (EQUETRO) 200 MG CP12 12 hr capsule Take 200 mg by mouth.      . Cholecalciferol (VITAMIN D3) 1000 UNITS CAPS Take by mouth.        . conjugated estrogens (PREMARIN) vaginal cream Place vaginally as directed. 3 times a week at bedtime      . diazepam (VALIUM) 10 MG tablet Take 1 tablet (10 mg total) by mouth daily as needed for anxiety.  40 tablet  1  . FLAXSEED, LINSEED, PO Take by mouth every other day.       . fluticasone (FLONASE) 50 MCG/ACT nasal spray Place 2 sprays into both nostrils daily.  16 g  6  . lamoTRIgine (LAMICTAL) 200 MG tablet Take 200 mg by mouth daily.        . Nutritional Supplements (ESTROVEN PO) Take by mouth.      . pantoprazole (PROTONIX) 40 MG tablet Take 1 tablet (40 mg total) by mouth  daily.  30 tablet  3  . potassium chloride SA (K-DUR,KLOR-CON) 20 MEQ tablet 1 tab daily and a second tab daily as needed for edema, sob,  135 tablet  1  . rOPINIRole (REQUIP) 1 MG tablet Take 1 mg by mouth daily.        . temazepam (RESTORIL) 15 MG capsule Take 1 capsule (15 mg total) by mouth at bedtime as needed for sleep.  30 capsule  1  . traMADol (ULTRAM) 50 MG tablet Take 2 tablets (100 mg total) by mouth every 8 (eight) hours as needed for moderate pain or severe pain.  120 tablet  0  . venlafaxine XR (EFFEXOR-XR) 150 MG 24 hr capsule Take 225 mg by mouth daily with breakfast.      . omeprazole (PRILOSEC) 40 MG capsule Take 40 mg by mouth daily.       No current facility-administered medications on file prior to visit.    Allergies  Allergen Reactions  . Carbamazepine Hives  . Ceclor [Cefaclor]     Tongue swell    Review of Systems  Review of Systems  Constitutional: Negative for fever and malaise/fatigue.  HENT: Negative for congestion.   Eyes: Negative for discharge.  Respiratory: Negative for shortness of breath.   Cardiovascular: Negative for chest pain, palpitations and leg swelling.  Gastrointestinal: Negative for nausea, abdominal pain and diarrhea.  Genitourinary: Negative for dysuria.  Musculoskeletal: Negative for falls.  Skin: Negative for rash.  Neurological: Negative for loss of consciousness and headaches.  Endo/Heme/Allergies: Negative for polydipsia.  Psychiatric/Behavioral: Negative for depression and suicidal ideas. The patient is not nervous/anxious and does not have insomnia.     Objective  BP 100/68  Pulse 92  Temp(Src) 98 F (36.7 C) (Oral)  Ht 5\' 5"  (1.651 m)  Wt 200 lb 1.3 oz (90.756 kg)  BMI 33.30 kg/m2  SpO2 92%  LMP 10/03/2012  Physical Exam  Physical Exam  Constitutional: She is oriented to person, place, and time and well-developed, well-nourished, and in no distress. No distress.  HENT:  Head: Normocephalic and atraumatic.   Left Ear: External ear normal.  Mouth/Throat: No oropharyngeal exudate.  Eyes: Conjunctivae and EOM are normal. Left eye exhibits no discharge. No scleral icterus.  Neck: Neck supple. No JVD present. No tracheal deviation present. No thyromegaly present.  Cardiovascular: Normal rate, regular rhythm, normal heart sounds and intact distal pulses.   No murmur heard. Pulmonary/Chest: Effort  normal and breath sounds normal. No respiratory distress. She has no wheezes. She has no rales.  Abdominal: She exhibits no distension and no mass. There is tenderness. There is no guarding.  Musculoskeletal: She exhibits no edema and no tenderness.  Lymphadenopathy:    She has no cervical adenopathy.  Neurological: She is alert and oriented to person, place, and time.  Skin: Skin is warm and dry. No rash noted. She is not diaphoretic. No erythema.  Psychiatric: Memory, affect and judgment normal.    Lab Results  Component Value Date   TSH 1.574 08/23/2013   Lab Results  Component Value Date   WBC 6.7 08/23/2013   HGB 13.5 08/23/2013   HCT 40.0 08/23/2013   MCV 86.8 08/23/2013   PLT 267 08/23/2013   Lab Results  Component Value Date   CREATININE 0.83 08/23/2013   BUN 19 08/23/2013   NA 142 08/23/2013   K 4.0 08/23/2013   CL 98 08/23/2013   CO2 31 08/23/2013   Lab Results  Component Value Date   ALT 18 08/23/2013   AST 17 08/23/2013   ALKPHOS 43 08/23/2013   BILITOT 0.4 08/23/2013   Lab Results  Component Value Date   CHOL 191 05/03/2013   Lab Results  Component Value Date   HDL 49 05/03/2013   Lab Results  Component Value Date   LDLCALC 101* 05/03/2013   Lab Results  Component Value Date   TRIG 203* 05/03/2013   Lab Results  Component Value Date   CHOLHDL 3.9 05/03/2013     Assessment & Plan  Obese Encouraged DASH diet, decrease po intake and increase exercise as tolerated. Needs 7-8 hours of sleep nightly. Avoid trans fats, eat small, frequent meals every 4-5 hours with lean  proteins, complex carbs and healthy fats. Minimize simple carbs, GMO foods.  Hyperlipidemia Tolerating statin, encouraged heart healthy diet, avoid trans fats, minimize simple carbs and saturated fats. Increase exercise as tolerated  Depression with anxiety Continues to struggle but is following with psychiatry. Reports being diagnosed with ADD is considering meds  GERD (gastroesophageal reflux disease) Avoid offending foods, start probiotics. Do not eat large meals in late evening and consider raising head of bed.

## 2013-12-28 NOTE — Assessment & Plan Note (Signed)
Tolerating statin, encouraged heart healthy diet, avoid trans fats, minimize simple carbs and saturated fats. Increase exercise as tolerated 

## 2014-01-08 ENCOUNTER — Institutional Professional Consult (permissible substitution): Payer: Self-pay | Admitting: Neurology

## 2014-01-14 ENCOUNTER — Other Ambulatory Visit: Payer: Self-pay | Admitting: Family Medicine

## 2014-01-14 ENCOUNTER — Telehealth: Payer: Self-pay

## 2014-01-14 ENCOUNTER — Encounter: Payer: Self-pay | Admitting: Neurology

## 2014-01-14 ENCOUNTER — Ambulatory Visit (INDEPENDENT_AMBULATORY_CARE_PROVIDER_SITE_OTHER): Payer: BC Managed Care – PPO | Admitting: Neurology

## 2014-01-14 ENCOUNTER — Encounter (INDEPENDENT_AMBULATORY_CARE_PROVIDER_SITE_OTHER): Payer: Self-pay

## 2014-01-14 VITALS — BP 111/70 | HR 71 | Temp 98.2°F | Ht 65.0 in | Wt 204.0 lb

## 2014-01-14 DIAGNOSIS — R0683 Snoring: Secondary | ICD-10-CM

## 2014-01-14 DIAGNOSIS — F329 Major depressive disorder, single episode, unspecified: Secondary | ICD-10-CM

## 2014-01-14 DIAGNOSIS — F32A Depression, unspecified: Secondary | ICD-10-CM

## 2014-01-14 DIAGNOSIS — G4710 Hypersomnia, unspecified: Secondary | ICD-10-CM

## 2014-01-14 DIAGNOSIS — R0609 Other forms of dyspnea: Secondary | ICD-10-CM

## 2014-01-14 DIAGNOSIS — F3289 Other specified depressive episodes: Secondary | ICD-10-CM

## 2014-01-14 DIAGNOSIS — R609 Edema, unspecified: Secondary | ICD-10-CM

## 2014-01-14 DIAGNOSIS — G4761 Periodic limb movement disorder: Secondary | ICD-10-CM

## 2014-01-14 DIAGNOSIS — R600 Localized edema: Secondary | ICD-10-CM

## 2014-01-14 DIAGNOSIS — G2581 Restless legs syndrome: Secondary | ICD-10-CM

## 2014-01-14 DIAGNOSIS — R0989 Other specified symptoms and signs involving the circulatory and respiratory systems: Secondary | ICD-10-CM

## 2014-01-14 DIAGNOSIS — F4321 Adjustment disorder with depressed mood: Secondary | ICD-10-CM

## 2014-01-14 DIAGNOSIS — R400 Somnolence: Secondary | ICD-10-CM

## 2014-01-14 NOTE — Telephone Encounter (Signed)
Per md the Disability office is wanting a narrative of why patient is out of work. Pt doesn't have an appt until 04-18-14. Per md see if pt wants to wait until then or come in after Dr Rhae Lerner vacation to get this paperwork done

## 2014-01-14 NOTE — Patient Instructions (Signed)

## 2014-01-14 NOTE — Progress Notes (Signed)
Subjective:    Patient ID: [name] is a 58 y.o. female.  HPI    Star Age, MD, PhD [facility] [address] [place], [place] [address]  Dear Sim Boast,   I saw your patient, [name], upon your kind request in my neurologic clinic today for initial consultation of her sleep disorder, in particular, concern for obstructive sleep apnea. The patient is unaccompanied today. As you know, [name] is a 58 year old right-handed woman with an underlying medical history of anxiety, depression, hyperlipidemia, seasonal allergies, restless leg syndrome, obesity, chronic low back pain, and reflux disease, who reports snoring, and daytime somnolence. She tends to eat late, around 8 PM and tends to go to bed soon after. She seems to sleep better in the early morning. She dreams vividly and also has flash backs, which are disturbing. She states, she has PTSD. She reports stressors, especially with her half-sister and her mother had a brain tumor, age 17.  She sleeps on her sides. She has LBP, she has radiating pain to the R leg. She has seen ortho. She was advised to lose weight. She has L foot pain. She cannot sleep on her back without her HOB up.   Her typical bedtime is reported to be around 10 PM and usual wake time is around 10 AM. Sleep onset typically occurs within 15 minutes. She reports feeling adequately rested upon awakening, but hurts "all over". She wakes up on an average 4 times in the middle of the night and has to go to the bathroom 1 times on a typical night. She denies morning headaches.  She reports excessive daytime somnolence (EDS) and Her Epworth Sleepiness Score (ESS) is 8/24 today. She has not fallen asleep while driving. The patient has not been taking a planned nap.  She has been known to snore for the past few years. Snoring is reportedly mild, but can be loud, and associated with choking sensations. there is report of  nighttime reflux and she is on omeprazole prn. There is no nighttime cough experienced. The patient has RLS symptoms and is known to kick while asleep or before falling asleep.  She is a restless sleeper and in the morning, the bed is quite disheveled.   She denies cataplexy, sleep paralysis, hypnagogic or hypnopompic hallucinations, or sleep attacks. She reports vivid dreams, and nightmares and sleep talking. No sleep walking.  She consumes 2 caffeinated beverages per day, usually in the form of coffee in the morning and tea with supper.  Her bedroom is usually dark and cool. There is a TV in the bedroom and usually she turns it off before falling asleep.   Her Past Medical History Is Significant For: Past Medical History  Diagnosis Date  . Allergy     seasonal  . Anxiety   . Depression   . Hyperlipidemia   . History of proteinuria syndrome   . Edema extremities   . RLS (restless legs syndrome)   . Dyspareunia   . Obese   . Onychomycosis 05/09/2011  . Low back pain 05/09/2011  . RUQ pain 09/20/2011  . Hip pain, left 11/01/2011  . Lymphadenitis 06/16/2012  . Otitis externa 06/23/2012    left  . GERD (gastroesophageal reflux disease)   . Insomnia 03/11/2013  . Perimenopause 03/11/2013  . Acute sinusitis 03/27/2013  . Unspecified constipation 05/13/2013  . Arthritis 06/09/2013  . Urinary frequency 08/26/2013  . Oral lesion 08/26/2013    Her Past  Surgical History Is Significant For: Past Surgical History  Procedure Laterality Date  . Cesarean section  1993  . Back surgery  2006    low, discectomy for herniated disc  . Tubal ligation  1993  . Carpal tunnel release Right 2006  . Bladder suspension N/A 11/07/2012    Procedure: [facility] SLING;  Surgeon: Malka So, MD;  Location: [facility];  Service: Urology;  Laterality: N/A;  . Cystoscopy N/A 11/07/2012    Procedure: CYSTOSCOPY;  Surgeon: Malka So, MD;  Location: [facility];  Service: Urology;   Laterality: N/A;    Her Family History Is Significant For: Family History  Problem Relation Age of Onset  . Cancer Mother 72    thyroid  . Arthritis Mother     nreck, back  . Hepatitis Sister     Hepatitis C  . Diabetes Maternal Grandmother     Her Social History Is Significant For: History   Social History  . Marital Status: Married    Spouse Name: N/A    Number of Children: N/A  . Years of Education: N/A   Social History Main Topics  . Smoking status: Former Smoker -- 0.50 packs/day for 20 years    Types: Cigarettes    Quit date: 07/12/1992  . Smokeless tobacco: Never Used  . Alcohol Use: Yes     Comment: occasionaly  . Drug Use: No  . Sexual Activity: Yes    Partners: Male   Other Topics Concern  . None   Social History Narrative  . None    Her Allergies Are:  Allergies  Allergen Reactions  . Carbamazepine Hives  . Ceclor [Cefaclor]     Tongue swell  :   Her Current Medications Are:  Outpatient Encounter Prescriptions as of 01/14/2014  Medication Sig  . albuterol (PROVENTIL HFA;VENTOLIN HFA) 108 (90 BASE) MCG/ACT inhaler Inhale 2 puffs into the lungs every 6 (six) hours as needed for wheezing or shortness of breath.  . baclofen (LIORESAL) 20 MG tablet Take 1 tablet (20 mg total) by mouth 4 (four) times daily as needed for muscle spasms.  Marland Kitchen BIOTIN PO Take by mouth.  . Calcium Carbonate-Vitamin D (CALTRATE 600+D) 600-400 MG-UNIT per tablet Take 1 tablet by mouth daily.   . carbamazepine (EQUETRO) 200 MG CP12 12 hr capsule Take 200 mg by mouth.  . Cholecalciferol (VITAMIN D3) 1000 UNITS CAPS Take by mouth.    . conjugated estrogens (PREMARIN) vaginal cream Place vaginally as directed. 3 times a week at bedtime  . diazepam (VALIUM) 10 MG tablet Take 1 tablet (10 mg total) by mouth daily as needed for anxiety.  Marland Kitchen FLAXSEED, LINSEED, PO Take by mouth every other day.   . fluticasone (FLONASE) 50 MCG/ACT nasal spray Place 2 sprays into both nostrils daily.  .  furosemide (LASIX) 40 MG tablet 1 tab po daily and a 2 nd tab prn increased edema  . hydrOXYzine (ATARAX/VISTARIL) 50 MG tablet Take 50 mg by mouth every 6 (six) hours as needed.  . lamoTRIgine (LAMICTAL) 200 MG tablet Take 200 mg by mouth daily.    . Nutritional Supplements (ESTROVEN PO) Take by mouth.  . pantoprazole (PROTONIX) 40 MG tablet Take 1 tablet (40 mg total) by mouth daily.  . potassium chloride SA (K-DUR,KLOR-CON) 20 MEQ tablet 1 tab daily and a second tab daily as needed for edema, sob,  . promethazine (PHENERGAN) 25 MG tablet Take 1 tablet (25 mg total) by mouth every  8 (eight) hours as needed for nausea.  Marland Kitchen rOPINIRole (REQUIP) 1 MG tablet Take 1 mg by mouth daily.    . simvastatin (ZOCOR) 20 MG tablet Take 1 tablet (20 mg total) by mouth daily at 6 PM.  . temazepam (RESTORIL) 15 MG capsule Take 1 capsule (15 mg total) by mouth at bedtime as needed for sleep.  . traMADol (ULTRAM) 50 MG tablet Take 2 tablets (100 mg total) by mouth every 8 (eight) hours as needed for moderate pain or severe pain.  Marland Kitchen venlafaxine XR (EFFEXOR-XR) 150 MG 24 hr capsule Take 225 mg by mouth daily with breakfast.  . omeprazole (PRILOSEC) 40 MG capsule Take 40 mg by mouth daily.  :  Review of Systems:  Out of a complete 14 point review of systems, all are reviewed and negative with the exception of these symptoms as listed below:  Review of Systems  Constitutional: Positive for activity change, fatigue and unexpected weight change.  HENT: Negative.   Eyes: Negative.   Respiratory: Positive for shortness of breath.   Cardiovascular: Positive for leg swelling. Palpitations: when pressed.  Gastrointestinal: Positive for abdominal pain.  Endocrine: Positive for polydipsia.  Genitourinary: Positive for difficulty urinating.  Musculoskeletal: Positive for arthralgias, joint swelling and myalgias.  Skin: Positive for rash.  Allergic/Immunologic: Positive for environmental allergies.  Neurological:  Positive for weakness and numbness.  Hematological: Bruises/bleeds easily.  Psychiatric/Behavioral: Positive for sleep disturbance (snoring, eds, restless leg, insomnia), dysphoric mood and decreased concentration. The patient is nervous/anxious.     Objective:  Neurologic Exam  Physical Exam Physical Examination:   Filed Vitals:   01/14/14 1038  BP: 111/70  Pulse: 71  Temp: 98.2 F (36.8 C)    General Examination: The patient is a 58 y.o. female in no acute distress. She appears well-developed and well-nourished and adequately groomed. She is overweight. She is tearful at times. She appears anxious.   HEENT: Normocephalic, atraumatic, pupils are equal, round and reactive to light and accommodation. Funduscopic exam is normal with sharp disc margins noted. Extraocular tracking is good without limitation to gaze excursion or nystagmus noted. Normal smooth pursuit is noted. Hearing is grossly intact. Tympanic membranes are clear bilaterally. Face is symmetric with normal facial animation and normal facial sensation. Speech is clear with no dysarthria noted. There is no hypophonia. There is no lip, neck/head, jaw or voice tremor. Neck is supple with full range of passive and active motion. There are no carotid bruits on auscultation. Oropharynx exam reveals: moderate mouth dryness, adequate dental hygiene and mild to moderate airway crowding, due to longer tongue, tonsils in place, and narrow airway entry. Mallampati is class II. Tongue protrudes centrally and palate elevates symmetrically. Tonsils are 1+ in size. Neck size is 16 3/8 inches. She has a Mild overbite. Nasal inspection reveals no significant nasal mucosal bogginess or redness and no septal deviation.   Chest: Clear to auscultation without wheezing, rhonchi or crackles noted.  Heart: S1+S2+0, regular and normal without murmurs, rubs or gallops noted.   Abdomen: Soft, non-tender and non-distended with normal bowel sounds  appreciated on auscultation.  Extremities: There is non-pitting edema of both lower legs and feet, L>R. Pedal pulses are intact.  Skin: Warm and dry without trophic changes noted. She has a patchy purplish non-raised rash on the R arm. There are no varicose veins.  Musculoskeletal: exam reveals no obvious joint deformities, tenderness or joint swelling or erythema.   Neurologically:  Mental status: The patient is awake, alert and  oriented in all 4 spheres. Her immediate and remote memory, attention, language skills and fund of knowledge are appropriate. There is no evidence of aphasia, agnosia, apraxia or anomia. Speech is clear with normal prosody and enunciation. Thought process is linear. Mood is sad and affect is flat.  Cranial nerves II - XII are as described above under HEENT exam. In addition: shoulder shrug is normal with equal shoulder height noted. Motor exam: Normal bulk, strength and tone is noted. There is no drift, tremor or rebound. Romberg is negative. Reflexes are 2+ throughout. Babinski: Toes are flexor bilaterally. Fine motor skills and coordination: intact with normal finger taps, normal hand movements, normal rapid alternating patting, normal foot taps and normal foot agility.  Cerebellar testing: No dysmetria or intention tremor on finger to nose testing. Heel to shin is unremarkable bilaterally. There is no truncal or gait ataxia.  Sensory exam: intact to light touch, pinprick, vibration, temperature sense in the upper and lower extremities.  Gait, station and balance: She stands easily. No veering to one side is noted. No leaning to one side is noted. Posture is age-appropriate and stance is narrow based. Gait shows normal stride length and normal pace. No problems turning are noted. She turns en bloc. Tandem walk is difficult for her. Intact toe and heel stance is noted.               Assessment and Plan:  In summary, [name] is a very pleasant 58 y.o.-year old  female with a history and physical exam concerning for obstructive sleep apnea (OSA), in that, she reports snoring, EDS and sleep disruption. In additions, she endorses sleep talking, RLS and PLMs.  I had a long chat with the patient about my findings and the diagnosis of OSA, its prognosis and treatment options. We talked about medical treatments, surgical interventions and non-pharmacological approaches. I explained in particular the risks and ramifications of untreated moderate to severe OSA, especially with respect to developing cardiovascular disease down the Road, including congestive heart failure, difficult to treat hypertension, cardiac arrhythmias, or stroke. Even type 2 diabetes has, in part, been linked to untreated OSA. Symptoms of untreated OSA include daytime sleepiness, memory problems, mood irritability and mood disorder such as depression and anxiety, lack of energy, as well as recurrent headaches, especially morning headaches. We talked about trying to maintain a healthy lifestyle in general, as well as the importance of weight control. I encouraged the patient to eat healthy, exercise daily and keep well hydrated, to keep a scheduled bedtime and wake time routine, to not skip any meals and eat healthy snacks in between meals. I advised the patient not to drive when feeling sleepy. I recommended the following at this time: sleep study with potential positive airway pressure titration.  I explained the sleep test procedure to the patient and also outlined possible surgical and non-surgical treatment options of OSA, including the use of a custom-made dental device (which would require a referral to a specialist dentist or oral surgeon), upper airway surgical options, such as pillar implants, radiofrequency surgery, tongue base surgery, and UPPP (which would involve a referral to an ENT surgeon). Rarely, jaw surgery such as mandibular advancement may be considered.  I also explained the CPAP  treatment option to the patient, who indicated that she would be willing to try CPAP if the need arises, but would prefer the nasal pillows and reports not being able to sleep flat on her back. I explained the importance of  being compliant with PAP treatment, not only for insurance purposes but primarily to improve Her symptoms, and for the patient's long term health benefit, including to reduce Her cardiovascular risks. I answered all her questions today and the patient were in agreement. I would like to see her back after the sleep study is completed and encouraged her to call with any interim questions, concerns, problems or updates.   Thank you very much for allowing me to participate in the care of this nice patient. If I can be of any further assistance to you please do not hesitate to call me at [phone].  Sincerely,   Star Age, MD, PhD

## 2014-01-14 NOTE — Telephone Encounter (Signed)
Call transferred to [facility] to schedule

## 2014-01-15 ENCOUNTER — Encounter: Payer: Self-pay | Admitting: Family Medicine

## 2014-01-15 ENCOUNTER — Ambulatory Visit (INDEPENDENT_AMBULATORY_CARE_PROVIDER_SITE_OTHER): Payer: BC Managed Care – PPO | Admitting: Family Medicine

## 2014-01-15 VITALS — BP 120/72 | HR 83 | Temp 98.3°F | Ht 65.0 in | Wt 205.1 lb

## 2014-01-15 DIAGNOSIS — M5442 Lumbago with sciatica, left side: Secondary | ICD-10-CM

## 2014-01-15 DIAGNOSIS — M129 Arthropathy, unspecified: Secondary | ICD-10-CM

## 2014-01-15 DIAGNOSIS — R5381 Other malaise: Secondary | ICD-10-CM

## 2014-01-15 DIAGNOSIS — E785 Hyperlipidemia, unspecified: Secondary | ICD-10-CM

## 2014-01-15 DIAGNOSIS — R5383 Other fatigue: Secondary | ICD-10-CM

## 2014-01-15 DIAGNOSIS — F341 Dysthymic disorder: Secondary | ICD-10-CM

## 2014-01-15 DIAGNOSIS — M1990 Unspecified osteoarthritis, unspecified site: Secondary | ICD-10-CM

## 2014-01-15 DIAGNOSIS — G4700 Insomnia, unspecified: Secondary | ICD-10-CM

## 2014-01-15 DIAGNOSIS — M5441 Lumbago with sciatica, right side: Secondary | ICD-10-CM

## 2014-01-15 DIAGNOSIS — F418 Other specified anxiety disorders: Secondary | ICD-10-CM

## 2014-01-15 DIAGNOSIS — M5430 Sciatica, unspecified side: Secondary | ICD-10-CM

## 2014-01-15 DIAGNOSIS — K219 Gastro-esophageal reflux disease without esophagitis: Secondary | ICD-10-CM

## 2014-01-15 LAB — LIPID PANEL
CHOL/HDL RATIO: 4.7 ratio
Cholesterol: 179 mg/dL (ref 0–200)
HDL: 38 mg/dL — AB (ref >39)
LDL Cholesterol: 82 mg/dL (ref 0–99)
TRIGLYCERIDES: 294 mg/dL — AB (ref <150)
VLDL: 59 mg/dL — AB (ref 0–40)

## 2014-01-15 LAB — RENAL FUNCTION PANEL
ALBUMIN: 3.6 g/dL (ref 3.5–5.2)
BUN: 21 mg/dL (ref 6–23)
CALCIUM: 8.7 mg/dL (ref 8.4–10.5)
CO2: 25 mEq/L (ref 19–32)
Chloride: 110 mEq/L (ref 96–112)
Creat: 0.79 mg/dL (ref 0.50–1.10)
GLUCOSE: 78 mg/dL (ref 70–99)
PHOSPHORUS: 3.8 mg/dL (ref 2.3–4.6)
Potassium: 4.4 mEq/L (ref 3.5–5.3)
Sodium: 142 mEq/L (ref 135–145)

## 2014-01-15 LAB — HEPATIC FUNCTION PANEL
ALT: 25 U/L (ref 0–35)
AST: 18 U/L (ref 0–37)
Albumin: 3.6 g/dL (ref 3.5–5.2)
Alkaline Phosphatase: 60 U/L (ref 39–117)
BILIRUBIN INDIRECT: 0.2 mg/dL (ref 0.2–1.2)
BILIRUBIN TOTAL: 0.3 mg/dL (ref 0.2–1.2)
Bilirubin, Direct: 0.1 mg/dL (ref 0.0–0.3)
Total Protein: 6.2 g/dL (ref 6.0–8.3)

## 2014-01-15 LAB — CBC
HCT: 39.6 % (ref 36.0–46.0)
Hemoglobin: 13.2 g/dL (ref 12.0–15.0)
MCH: 28.6 pg (ref 26.0–34.0)
MCHC: 33.3 g/dL (ref 30.0–36.0)
MCV: 85.7 fL (ref 78.0–100.0)
PLATELETS: 265 10*3/uL (ref 150–400)
RBC: 4.62 MIL/uL (ref 3.87–5.11)
RDW: 13.7 % (ref 11.5–15.5)
WBC: 5 10*3/uL (ref 4.0–10.5)

## 2014-01-15 LAB — TSH: TSH: 0.993 u[IU]/mL (ref 0.350–4.500)

## 2014-01-15 MED ORDER — DIAZEPAM 10 MG PO TABS: 10.0000 mg | ORAL_TABLET | Freq: Every day | ORAL | Status: DC | PRN

## 2014-01-15 NOTE — Progress Notes (Signed)
Patient ID: [name], female   DOB: November 08, 1955, 58 y.o.   MRN: [id number]

## 2014-01-15 NOTE — Patient Instructions (Signed)
Cholesterol  Cholesterol is a white, waxy, fat-like protein needed by your body in small amounts. The liver makes all the cholesterol you need. Cholesterol is carried from the liver by the blood through the blood vessels. Deposits of cholesterol (plaque) may build up on blood vessel walls. These make the arteries narrower and stiffer. Cholesterol plaques increase the risk for heart attack and stroke.   You cannot feel your cholesterol level even if it is very high. The only way to know it is high is with a blood test. Once you know your cholesterol levels, you should keep a record of the test results. Work with your health care provider to keep your levels in the desired range.   WHAT DO THE RESULTS MEAN?  · Total cholesterol is a rough measure of all the cholesterol in your blood.    · LDL is the so-called bad cholesterol. This is the type that deposits cholesterol in the walls of the arteries. You want this level to be low.    · HDL is the good cholesterol because it cleans the arteries and carries the LDL away. You want this level to be high.  · Triglycerides are fat that the body can either burn for energy or store. High levels are closely linked to heart disease.    WHAT ARE THE DESIRED LEVELS OF CHOLESTEROL?  · Total cholesterol below 200.    · LDL below 100 for people at risk, below 70 for those at very high risk.    · HDL above 50 is good, above 60 is best.    · Triglycerides below 150.    HOW CAN I LOWER MY CHOLESTEROL?  · Diet. Follow your diet programs as directed by your health care provider.    ¨ Choose fish or white meat chicken and turkey, roasted or baked. Limit fatty cuts of red meat, fried foods, and processed meats, such as sausage and lunch meats.    ¨ Eat lots of fresh fruits and vegetables.  ¨ Choose whole grains, beans, pasta, potatoes, and cereals.    ¨ Use only small amounts of olive, corn, or canola oils.    ¨ Avoid butter, mayonnaise, shortening, or palm kernel oils.  ¨ Avoid foods with  trans fats.    ¨ Drink skim or nonfat milk and eat low-fat or nonfat yogurt and cheeses. Avoid whole milk, cream, ice cream, egg yolks, and full-fat cheeses.    ¨ Healthy desserts include angel food cake, ginger snaps, animal crackers, hard candy, popsicles, and low-fat or nonfat frozen yogurt. Avoid pastries, cakes, pies, and cookies.    · Exercise. Follow your exercise programs as directed by your health care provider.    ¨ A regular program helps decrease LDL and raise HDL.    ¨ A regular program helps with weight control.    ¨ Do things that increase your activity level like gardening, walking, or taking the stairs. Ask your health care provider about how you can be more active in your daily life.    · Medicine. Take medicine as directed by your health care provider.    ¨ Medicine may be prescribed by your health care provider to help lower cholesterol and decrease the risk for heart disease.    ¨ If you have several risk factors, you may need medicine even if your levels are normal.  Document Released: 03/23/2001 Document Revised: 07/03/2013 Document Reviewed: 04/11/2013  [employer]® Patient Information ©2015 [employer]. This information is not intended to replace advice given to you by your health care provider. Make sure you discuss any questions you have with your health   care provider.

## 2014-01-15 NOTE — Assessment & Plan Note (Signed)
Encouraged heart healthy diet, increase exercise, avoid trans fats, consider a krill oil cap daily 
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
## 2014-01-15 NOTE — Progress Notes (Signed)
Pre visit review using our clinic review tool, if applicable. No additional management support is needed unless otherwise documented below in the visit note. 

## 2014-01-16 ENCOUNTER — Encounter: Payer: Self-pay | Admitting: Family Medicine

## 2014-01-16 NOTE — Assessment & Plan Note (Signed)
Pain makes her unable to lift greater than 20 pounds or maintain a single position for any period of time. Unable to work as a Dentist

## 2014-01-16 NOTE — Assessment & Plan Note (Signed)
Struggles with chronic diffuse pain. Unable to lift, grip, twist without pain or weakness in hands, elbows etc
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
## 2014-01-16 NOTE — Assessment & Plan Note (Signed)
Avoid offending foods, start probiotics. Do not eat large meals in late evening and consider raising head of bed.  

## 2014-01-17 ENCOUNTER — Telehealth: Payer: Self-pay

## 2014-01-17 ENCOUNTER — Other Ambulatory Visit: Payer: Self-pay | Admitting: Family Medicine

## 2014-01-17 MED ORDER — SIMVASTATIN 40 MG PO TABS: 40.0000 mg | ORAL_TABLET | Freq: Every day | ORAL | Status: DC

## 2014-01-17 NOTE — Telephone Encounter (Signed)
Message copied by Varney Daily on Thu Jan 17, 2014  9:21 AM ------      Message from: Penni Homans A      Created: Wed Jan 16, 2014  8:25 PM      Regarding: Kerrigan, Berneda       I have written her disability letter, please print and forward to [place]. They also need some records forwarded, I have the papers that explain. Dr B ------

## 2014-01-17 NOTE — Telephone Encounter (Signed)
Already sent on 01-15-14

## 2014-01-17 NOTE — Addendum Note (Signed)
Addended by: Jannette Spanner on: 01/17/2014 09:09 AM   Modules accepted: Orders

## 2014-01-17 NOTE — Telephone Encounter (Signed)
Do you have the paperwork with the fax #?

## 2014-01-17 NOTE — Telephone Encounter (Signed)
Yes now on your desk

## 2014-01-17 NOTE — Telephone Encounter (Signed)
Sent to be faxed.  

## 2014-01-22 ENCOUNTER — Other Ambulatory Visit: Payer: Self-pay | Admitting: Family Medicine

## 2014-01-22 NOTE — Telephone Encounter (Signed)
Pt has f/u 04/18/14. Last tramadol refill 10/09/13, #120.  Rx printed and forwarded to Provider for signature.

## 2014-01-23 NOTE — Telephone Encounter (Signed)
Rx faxed to pharmacy  

## 2014-02-06 ENCOUNTER — Encounter: Payer: Self-pay | Admitting: Dietician

## 2014-02-06 ENCOUNTER — Encounter: Payer: BC Managed Care – PPO | Attending: Family Medicine | Admitting: Dietician

## 2014-02-06 VITALS — Ht 65.0 in | Wt 203.7 lb

## 2014-02-06 DIAGNOSIS — Z713 Dietary counseling and surveillance: Secondary | ICD-10-CM | POA: Diagnosis not present

## 2014-02-06 DIAGNOSIS — E669 Obesity, unspecified: Secondary | ICD-10-CM | POA: Insufficient documentation

## 2014-02-06 DIAGNOSIS — Z6834 Body mass index (BMI) 34.0-34.9, adult: Secondary | ICD-10-CM | POA: Insufficient documentation

## 2014-02-06 NOTE — Progress Notes (Signed)
  Medical Nutrition Therapy:  Appt start time: 1115 end time:  1215.   Assessment:  Primary concerns today: [name] is here today to "understand what to eat and how much at a time." She states that her husband is physically disabled and causes her a lot of stress. Additionally, her mom was recently diagnosed with cancer and this stress has caused a 25-lb weight gain. She states that she weight 174 lbs last November. In the past she tried [facility] and lost 50 pounds. [name] reports excessive swelling recently. Having pain in inner thigh and left ear. 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 She is currently unemployed and attempting to get approved for disability. Having 1 glass of wine a night instead of 2.  Preferred Learning Style:  No preference indicated   Learning Readiness:  Contemplating   MEDICATIONS: see list   DIETARY INTAKE:    24-hr recall:  B ( AM): 1/2 cup of regular coffee with low fat creamer and stevia, frosted mini wheats with skim milk   Snk ( AM): none  L ( PM): sometimes skips, small can of tuna in water Snk ( PM):  D ( PM): wheat crackers and cheese, salad with balsamic dressing, salmon, 2 pieces of bread with cream cheese Snk ( PM): ice cream  Beverages: water, coke when nauseated, unsweet tea, ginger ale, hot tea  Usual physical activity: very little, muscle, nerve, back, and knee pain  Estimated energy needs: 1600-1800 calories  Progress Towards Goal(s):  In progress.   Nutritional Diagnosis:  [place]-3.3 Overweight/obesity As related to history of excessive energy intake and inappropriate food choices.  As evidenced by BMI 34.    Intervention:  Nutrition counseling provided.  Teaching Method Utilized:  Visual Auditory  Handouts given during visit include:  Low sodium flavoring tips  MyPlate  Barriers to learning/adherence to lifestyle change: none  Demonstrated degree of understanding via:  Teach Back    Monitoring/Evaluation:  Dietary intake, exercise, and body weight in 3 month(s).

## 2014-02-06 NOTE — Patient Instructions (Addendum)
-  Get mole on head checked out!  -Continue to swim and do water aerobics  -Walk and stationary biking as tolerated  -Work on portion sizes  -Eat off of small bowls and plates  -Use your hand for reference  -Stick to lean meats (poultry and fish)  -Lean ground beef and lean cuts of beef and pork  -Avoid skipping meals  -Fill up on non-starchy vegetables (any veggie that is not corn, peas, or potatoes)  -Veggies are good raw or cooked, fresh or frozen  -Avoid salting food  -Use Ms. Dash, herbs, and spices  -Grill, boil, steam, broil, sautee, bake, roast (avoid frying foods) -Limit butter  -Choose Dannon Light and Fit Greek yogurt  -Practice mindful eating  -Eat slowly and eat only in the kitchen  -Choose a cereal that is high in protein and fiber and low in sugar

## 2014-02-07 ENCOUNTER — Ambulatory Visit (INDEPENDENT_AMBULATORY_CARE_PROVIDER_SITE_OTHER): Payer: BC Managed Care – PPO | Admitting: Neurology

## 2014-02-07 DIAGNOSIS — G4761 Periodic limb movement disorder: Secondary | ICD-10-CM

## 2014-02-07 DIAGNOSIS — R600 Localized edema: Secondary | ICD-10-CM

## 2014-02-07 DIAGNOSIS — G2581 Restless legs syndrome: Secondary | ICD-10-CM

## 2014-02-07 DIAGNOSIS — G4730 Sleep apnea, unspecified: Secondary | ICD-10-CM

## 2014-02-07 DIAGNOSIS — G4710 Hypersomnia, unspecified: Secondary | ICD-10-CM

## 2014-02-07 DIAGNOSIS — R400 Somnolence: Secondary | ICD-10-CM

## 2014-02-07 DIAGNOSIS — F32A Depression, unspecified: Secondary | ICD-10-CM

## 2014-02-07 DIAGNOSIS — F4321 Adjustment disorder with depressed mood: Secondary | ICD-10-CM

## 2014-02-07 DIAGNOSIS — F329 Major depressive disorder, single episode, unspecified: Secondary | ICD-10-CM

## 2014-02-07 DIAGNOSIS — R0683 Snoring: Secondary | ICD-10-CM

## 2014-02-07 DIAGNOSIS — G4733 Obstructive sleep apnea (adult) (pediatric): Secondary | ICD-10-CM

## 2014-02-20 ENCOUNTER — Telehealth: Payer: Self-pay | Admitting: Neurology

## 2014-02-20 DIAGNOSIS — G4733 Obstructive sleep apnea (adult) (pediatric): Secondary | ICD-10-CM

## 2014-02-20 DIAGNOSIS — G4710 Hypersomnia, unspecified: Secondary | ICD-10-CM

## 2014-02-20 DIAGNOSIS — G4730 Sleep apnea, unspecified: Secondary | ICD-10-CM

## 2014-02-20 DIAGNOSIS — G4734 Idiopathic sleep related nonobstructive alveolar hypoventilation: Secondary | ICD-10-CM

## 2014-02-20 NOTE — Telephone Encounter (Signed)
Sleep study confirmed OSA, but she did not get dream sleep. Oxygen levels were below normal too. Given the patient's significant sleep related complaints, including her severe daytime somnolence, and the fact that she had significant desaturations including evidence of nocturnal hypoxemia in the context of sleep disordered breathing, treatment of her obstructive sleep apnea with CPAP as a reasonable consideration and I would like to offer this to the patient. For this, she will ideally have to return for a full night CPAP titration study for proper mask fitting and titration. I will order cpap titration, please call pt.

## 2014-02-26 ENCOUNTER — Encounter: Payer: Self-pay | Admitting: *Deleted

## 2014-02-26 NOTE — Telephone Encounter (Signed)
I called and spoke with the patient about her recent sleep study. I informed the patient that the study confirmed the diagnosis of obstructive sleep apnea and oxygen level were below normal. I informed the patient that given her complaints of severe daytime somnolence and evidence of nocturnal hypoxemia. Dr. Rexene Alberts recommends CPAP therapy. This will required a repeat study for the proper titration and mask fitting. Patient has been scheduled for Oct. 1,2015 at 8:00 pm. I will fax a copy of the report to Dr. Sim Boast McKinney's office and mail a copy to the patient.

## 2014-03-15 ENCOUNTER — Telehealth: Payer: Self-pay | Admitting: Neurology

## 2014-03-15 NOTE — Telephone Encounter (Signed)
Patient calling to schedule a sleep study follow up with Dr. Rexene Alberts, please return call and advise.

## 2014-03-19 ENCOUNTER — Other Ambulatory Visit: Payer: Self-pay

## 2014-03-19 NOTE — Telephone Encounter (Signed)
Please advise Baclofen refill? Last RX was done on 06-04-13 quantity 120 with 1 refill

## 2014-03-20 MED ORDER — BACLOFEN 20 MG PO TABS: 20.0000 mg | ORAL_TABLET | Freq: Four times a day (QID) | ORAL | Status: DC | PRN

## 2014-03-20 NOTE — Telephone Encounter (Signed)
OK to give 30 tabs without refills, same sig but needs an appt to get any more.

## 2014-03-20 NOTE — Telephone Encounter (Signed)
Patient already has appointment on 04/18/14 with Dr. Charlett Blake

## 2014-03-20 NOTE — Telephone Encounter (Signed)
Please call and inform pt of mds message below

## 2014-03-22 ENCOUNTER — Telehealth: Payer: Self-pay | Admitting: Neurology

## 2014-03-22 NOTE — Telephone Encounter (Signed)
States Dr. Caprice Beaver that she needs to see Dr. Rexene Alberts  Before she comes in for her sleep study. I advised her I will have someone call to speak with her

## 2014-03-25 NOTE — Telephone Encounter (Signed)
Contacted patient to inquire why she wanted to see Dr. Rexene Alberts prior to having a CPAP titration study performed.  Pt stated one of her doctor's suggested the use of supplemental 02 rather than use of CPAP.  I indicated to pt that the use of 02 was not the indicated treatment for OSA.  She was understanding of information yet still requested that her Oct. 1st CPAP titration study be canceled until she has an opportunity to see her PCP.  Pt was informed of the importance of treating OSA and stated she would call back to reschedule appointment.

## 2014-03-26 ENCOUNTER — Telehealth: Payer: Self-pay | Admitting: *Deleted

## 2014-03-26 MED ORDER — TRAMADOL HCL 50 MG PO TABS: ORAL_TABLET | ORAL | Status: DC

## 2014-03-26 NOTE — Telephone Encounter (Signed)
faxed

## 2014-03-26 NOTE — Telephone Encounter (Signed)
Received fax requesting refill of tramadol 50mg . Last RF 01/22/14, #120. Pt has f/u 04/18/14.  Rx printed and forwarded to Provider for signature.

## 2014-04-16 ENCOUNTER — Encounter: Payer: Self-pay | Admitting: Family Medicine

## 2014-04-16 ENCOUNTER — Ambulatory Visit (INDEPENDENT_AMBULATORY_CARE_PROVIDER_SITE_OTHER): Payer: BC Managed Care – PPO | Admitting: Family Medicine

## 2014-04-16 VITALS — BP 98/46 | HR 79 | Temp 98.2°F | Ht 65.0 in | Wt 205.2 lb

## 2014-04-16 DIAGNOSIS — R350 Frequency of micturition: Secondary | ICD-10-CM

## 2014-04-16 DIAGNOSIS — R609 Edema, unspecified: Secondary | ICD-10-CM

## 2014-04-16 DIAGNOSIS — Z23 Encounter for immunization: Secondary | ICD-10-CM

## 2014-04-16 DIAGNOSIS — R52 Pain, unspecified: Secondary | ICD-10-CM

## 2014-04-16 DIAGNOSIS — R600 Localized edema: Secondary | ICD-10-CM

## 2014-04-16 DIAGNOSIS — K219 Gastro-esophageal reflux disease without esophagitis: Secondary | ICD-10-CM

## 2014-04-16 DIAGNOSIS — R109 Unspecified abdominal pain: Secondary | ICD-10-CM | POA: Insufficient documentation

## 2014-04-16 MED ORDER — SIMVASTATIN 40 MG PO TABS: 40.0000 mg | ORAL_TABLET | Freq: Every day | ORAL | Status: DC

## 2014-04-16 MED ORDER — HYDROCODONE-ACETAMINOPHEN 5-325 MG PO TABS: 1.0000 | ORAL_TABLET | Freq: Four times a day (QID) | ORAL | Status: DC | PRN

## 2014-04-16 MED ORDER — METOLAZONE 2.5 MG PO TABS: 2.5000 mg | ORAL_TABLET | Freq: Every day | ORAL | Status: DC

## 2014-04-16 NOTE — Progress Notes (Signed)
Pre visit review using our clinic review tool, if applicable. No additional management support is needed unless otherwise documented below in the visit note. 

## 2014-04-16 NOTE — Patient Instructions (Signed)

## 2014-04-17 LAB — URINE CULTURE
COLONY COUNT: NO GROWTH
ORGANISM ID, BACTERIA: NO GROWTH

## 2014-04-17 LAB — URINALYSIS
Bilirubin Urine: NEGATIVE
Hgb urine dipstick: NEGATIVE
Ketones, ur: NEGATIVE
Leukocytes, UA: NEGATIVE
NITRITE: NEGATIVE
PH: 5 (ref 5.0–8.0)
SPECIFIC GRAVITY, URINE: 1.01 (ref 1.000–1.030)
TOTAL PROTEIN, URINE-UPE24: NEGATIVE
URINE GLUCOSE: NEGATIVE
Urobilinogen, UA: 0.2 (ref 0.0–1.0)

## 2014-04-18 ENCOUNTER — Ambulatory Visit (INDEPENDENT_AMBULATORY_CARE_PROVIDER_SITE_OTHER): Payer: BC Managed Care – PPO | Admitting: Family Medicine

## 2014-04-18 ENCOUNTER — Encounter: Payer: Self-pay | Admitting: Family Medicine

## 2014-04-18 VITALS — BP 106/69 | HR 93 | Temp 98.4°F | Ht 65.0 in | Wt 198.4 lb

## 2014-04-18 DIAGNOSIS — M5410 Radiculopathy, site unspecified: Secondary | ICD-10-CM

## 2014-04-18 DIAGNOSIS — G4730 Sleep apnea, unspecified: Secondary | ICD-10-CM

## 2014-04-18 DIAGNOSIS — M25552 Pain in left hip: Secondary | ICD-10-CM

## 2014-04-18 DIAGNOSIS — E785 Hyperlipidemia, unspecified: Secondary | ICD-10-CM

## 2014-04-18 DIAGNOSIS — R52 Pain, unspecified: Secondary | ICD-10-CM

## 2014-04-18 DIAGNOSIS — K219 Gastro-esophageal reflux disease without esophagitis: Secondary | ICD-10-CM

## 2014-04-18 DIAGNOSIS — E669 Obesity, unspecified: Secondary | ICD-10-CM

## 2014-04-18 DIAGNOSIS — M545 Low back pain: Secondary | ICD-10-CM

## 2014-04-18 DIAGNOSIS — Z0000 Encounter for general adult medical examination without abnormal findings: Secondary | ICD-10-CM

## 2014-04-18 DIAGNOSIS — R600 Localized edema: Secondary | ICD-10-CM

## 2014-04-18 DIAGNOSIS — K5900 Constipation, unspecified: Secondary | ICD-10-CM

## 2014-04-18 DIAGNOSIS — R609 Edema, unspecified: Secondary | ICD-10-CM

## 2014-04-18 DIAGNOSIS — Z0001 Encounter for general adult medical examination with abnormal findings: Secondary | ICD-10-CM

## 2014-04-18 MED ORDER — BACLOFEN 20 MG PO TABS: 20.0000 mg | ORAL_TABLET | Freq: Every evening | ORAL | Status: DC | PRN

## 2014-04-18 MED ORDER — FUROSEMIDE 40 MG PO TABS: ORAL_TABLET | ORAL | Status: DC

## 2014-04-18 MED ORDER — PANTOPRAZOLE SODIUM 40 MG PO TBEC: 40.0000 mg | DELAYED_RELEASE_TABLET | Freq: Every day | ORAL | Status: DC

## 2014-04-18 MED ORDER — RANITIDINE HCL 300 MG PO TABS: 300.0000 mg | ORAL_TABLET | Freq: Every day | ORAL | Status: DC

## 2014-04-18 NOTE — Assessment & Plan Note (Signed)
Avoid offending foods, start probiotics. Do not eat large meals in late evening and consider raising head of bed.  

## 2014-04-18 NOTE — Patient Instructions (Signed)
Gastroesophageal Reflux Disease, Adult Gastroesophageal reflux disease (GERD) happens when acid from your stomach flows up into the esophagus. When acid comes in contact with the esophagus, the acid causes soreness (inflammation) in the esophagus. Over time, GERD may create small holes (ulcers) in the lining of the esophagus. CAUSES   Increased body weight. This puts pressure on the stomach, making acid rise from the stomach into the esophagus.  Smoking. This increases acid production in the stomach.  Drinking alcohol. This causes decreased pressure in the lower esophageal sphincter (valve or ring of muscle between the esophagus and stomach), allowing acid from the stomach into the esophagus.  Late evening meals and a full stomach. This increases pressure and acid production in the stomach.  A malformed lower esophageal sphincter. Sometimes, no cause is found. SYMPTOMS   Burning pain in the lower part of the mid-chest behind the breastbone and in the mid-stomach area. This may occur twice a week or more often.  Trouble swallowing.  Sore throat.  Dry cough.  Asthma-like symptoms including chest tightness, shortness of breath, or wheezing. DIAGNOSIS  Your caregiver may be able to diagnose GERD based on your symptoms. In some cases, X-rays and other tests may be done to check for complications or to check the condition of your stomach and esophagus. TREATMENT  Your caregiver may recommend over-the-counter or prescription medicines to help decrease acid production. Ask your caregiver before starting or adding any new medicines.  HOME CARE INSTRUCTIONS   Change the factors that you can control. Ask your caregiver for guidance concerning weight loss, quitting smoking, and alcohol consumption.  Avoid foods and drinks that make your symptoms worse, such as:  Caffeine or alcoholic drinks.  Chocolate.  Peppermint or mint flavorings.  Garlic and onions.  Spicy foods.  Citrus fruits,  such as oranges, lemons, or limes.  Tomato-based foods such as sauce, chili, salsa, and pizza.  Fried and fatty foods.  Avoid lying down for the 3 hours prior to your bedtime or prior to taking a nap.  Eat small, frequent meals instead of large meals.  Wear loose-fitting clothing. Do not wear anything tight around your waist that causes pressure on your stomach.  Raise the head of your bed 6 to 8 inches with wood blocks to help you sleep. Extra pillows will not help.  Only take over-the-counter or prescription medicines for pain, discomfort, or fever as directed by your caregiver.  Do not take aspirin, ibuprofen, or other nonsteroidal anti-inflammatory drugs (NSAIDs). SEEK IMMEDIATE MEDICAL CARE IF:   You have pain in your arms, neck, jaw, teeth, or back.  Your pain increases or changes in intensity or duration.  You develop nausea, vomiting, or sweating (diaphoresis).  You develop shortness of breath, or you faint.  Your vomit is green, yellow, black, or looks like coffee grounds or blood.  Your stool is red, bloody, or black. These symptoms could be signs of other problems, such as heart disease, gastric bleeding, or esophageal bleeding. MAKE SURE YOU:   Understand these instructions.  Will watch your condition.  Will get help right away if you are not doing well or get worse. Document Released: 04/07/2005 Document Revised: 09/20/2011 Document Reviewed: 01/15/2011 ExitCare Patient Information 2015 [employer]. This information is not intended to replace advice given to you by your health care provider. Make sure you discuss any questions you have with your health care provider.  

## 2014-04-18 NOTE — Progress Notes (Signed)
Pre visit review using our clinic review tool, if applicable. No additional management support is needed unless otherwise documented below in the visit note. 

## 2014-04-19 LAB — CBC
HCT: 47.3 % — ABNORMAL HIGH (ref 36.0–46.0)
Hemoglobin: 15.4 g/dL — ABNORMAL HIGH (ref 12.0–15.0)
MCHC: 32.5 g/dL (ref 30.0–36.0)
MCV: 86.1 fl (ref 78.0–100.0)
PLATELETS: 321 10*3/uL (ref 150.0–400.0)
RBC: 5.5 Mil/uL — AB (ref 3.87–5.11)
RDW: 13.9 % (ref 11.5–15.5)
WBC: 8.1 10*3/uL (ref 4.0–10.5)

## 2014-04-19 LAB — LIPID PANEL
CHOLESTEROL: 198 mg/dL (ref 0–200)
HDL: 44.6 mg/dL (ref >39.00)
LDL CALC: 117 mg/dL — AB (ref 0–99)
NonHDL: 153.4
Total CHOL/HDL Ratio: 4
Triglycerides: 181 mg/dL — ABNORMAL HIGH (ref 0.0–149.0)
VLDL: 36.2 mg/dL (ref 0.0–40.0)

## 2014-04-19 LAB — HEPATIC FUNCTION PANEL
ALT: 47 U/L — ABNORMAL HIGH (ref 0–35)
AST: 46 U/L — AB (ref 0–37)
Albumin: 4.2 g/dL (ref 3.5–5.2)
Alkaline Phosphatase: 88 U/L (ref 39–117)
BILIRUBIN DIRECT: 0.1 mg/dL (ref 0.0–0.3)
BILIRUBIN TOTAL: 0.5 mg/dL (ref 0.2–1.2)
Total Protein: 8.5 g/dL — ABNORMAL HIGH (ref 6.0–8.3)

## 2014-04-19 LAB — RENAL FUNCTION PANEL
ALBUMIN: 4.2 g/dL (ref 3.5–5.2)
BUN: 20 mg/dL (ref 6–23)
CALCIUM: 10.8 mg/dL — AB (ref 8.4–10.5)
CHLORIDE: 98 meq/L (ref 96–112)
CO2: 29 mEq/L (ref 19–32)
Creatinine, Ser: 1 mg/dL (ref 0.4–1.2)
GFR: 60.89 mL/min (ref >60.00)
GLUCOSE: 77 mg/dL (ref 70–99)
Phosphorus: 5.7 mg/dL — ABNORMAL HIGH (ref 2.3–4.6)
Potassium: 3.7 mEq/L (ref 3.5–5.1)
Sodium: 139 mEq/L (ref 135–145)

## 2014-04-19 LAB — TSH: TSH: 0.76 u[IU]/mL (ref 0.35–4.50)

## 2014-04-21 ENCOUNTER — Encounter: Payer: Self-pay | Admitting: Family Medicine

## 2014-04-21 DIAGNOSIS — G4730 Sleep apnea, unspecified: Secondary | ICD-10-CM

## 2014-04-21 DIAGNOSIS — R52 Pain, unspecified: Secondary | ICD-10-CM | POA: Insufficient documentation

## 2014-04-21 DIAGNOSIS — Z0001 Encounter for general adult medical examination with abnormal findings: Secondary | ICD-10-CM

## 2014-04-21 HISTORY — DX: Encounter for general adult medical examination with abnormal findings: Z00.01

## 2014-04-21 HISTORY — DX: Sleep apnea, unspecified: G47.30

## 2014-04-21 NOTE — Assessment & Plan Note (Signed)
Increase Lasix for the next 3-5 days, minimize sodium, elevate feet frequently, try compression hose and report if no improvement.

## 2014-04-21 NOTE — Assessment & Plan Note (Signed)
Worsening, with notable disease will refer to neurosurgeon for further consideration.

## 2014-04-21 NOTE — Progress Notes (Signed)
Patient ID: [name], female   DOB: [birth year], 58 y.o.   MRN: [id number] ELVERA ALMARIO [id number] 11/01/1955 04/21/2014      Progress Note-Follow Up  Subjective  Chief Complaint  Chief Complaint  Patient presents with  . Knee Pain    both knees X 3 months, bottom of feet hurt  . Injections    flu    HPI  Patient is a 58 year old female in today for routine medical care. Patient is in today complaining of worsening pedal edema bilateral lower extremities. Has been worse this week. She is complaining of right shoulder and right elbow pain status post fall about 6 weeks ago. Has persistent knee pain and back pain as well. Continues to struggle with significant stressors and anxiety. Denies CP/palp/SOB/HA/congestion/fevers/GI c/o. Taking meds as prescribed. Notes urinary frequency but no dysuria  Past Medical History  Diagnosis Date  . Allergy     seasonal  . Anxiety   . Depression   . Hyperlipidemia   . History of proteinuria syndrome   . Edema extremities   . RLS (restless legs syndrome)   . Dyspareunia   . Obese   . Onychomycosis 05/09/2011  . Low back pain 05/09/2011  . RUQ pain 09/20/2011  . Hip pain, left 11/01/2011  . Lymphadenitis 06/16/2012  . Otitis externa 06/23/2012    left  . GERD (gastroesophageal reflux disease)   . Insomnia 03/11/2013  . Perimenopause 03/11/2013  . Acute sinusitis 03/27/2013  . Unspecified constipation 05/13/2013  . Arthritis 06/09/2013  . Urinary frequency 08/26/2013  . Oral lesion 08/26/2013    Past Surgical History  Procedure Laterality Date  . Cesarean section  1993  . Back surgery  2006    low, discectomy for herniated disc  . Tubal ligation  1993  . Carpal tunnel release Right 2006  . Bladder suspension N/A 11/07/2012    Procedure: [facility];  Surgeon: Malka So, MD;  Location: [facility];  Service: Urology;  Laterality: N/A;  . Cystoscopy N/A 11/07/2012    Procedure: CYSTOSCOPY;  Surgeon: Malka So,  MD;  Location: [facility];  Service: Urology;  Laterality: N/A;    Family History  Problem Relation Age of Onset  . Cancer Mother 87    thyroid, malignant brain tumor  . Arthritis Mother     neck, back  . Hepatitis Sister     Hepatitis C  . Heart disease Sister     stent  . Diabetes Maternal Grandmother   . Arthritis Father     back pain  . Heart disease Father     History   Social History  . Marital Status: Married    Spouse Name: N/A    Number of Children: N/A  . Years of Education: N/A   Occupational History  . Not on file.   Social History Main Topics  . Smoking status: Former Smoker -- 0.50 packs/day for 20 years    Types: Cigarettes    Quit date: 07/12/1992  . Smokeless tobacco: Never Used  . Alcohol Use: Yes     Comment: occasionaly  . Drug Use: No  . Sexual Activity: Yes    Partners: Male   Other Topics Concern  . Not on file   Social History Narrative  . No narrative on file    Current Outpatient Prescriptions on File Prior to Visit  Medication Sig Dispense Refill  . albuterol (PROVENTIL HFA;VENTOLIN HFA) 108 (90 BASE) MCG/ACT  inhaler Inhale 2 puffs into the lungs every 6 (six) hours as needed for wheezing or shortness of breath.  1 Inhaler  2  . BIOTIN PO Take by mouth.      . Calcium Carbonate-Vitamin D (CALTRATE 600+D) 600-400 MG-UNIT per tablet Take 1 tablet by mouth daily.       . Cholecalciferol (VITAMIN D3) 1000 UNITS CAPS Take by mouth.        . conjugated estrogens (PREMARIN) vaginal cream Place vaginally as directed. 3 times a week at bedtime      . diazepam (VALIUM) 10 MG tablet Take 1 tablet (10 mg total) by mouth daily as needed for anxiety.  40 tablet  1  . FLAXSEED, LINSEED, PO Take by mouth every other day.       . fluticasone (FLONASE) 50 MCG/ACT nasal spray Place 2 sprays into both nostrils daily.  16 g  6  . hydrOXYzine (ATARAX/VISTARIL) 50 MG tablet Take 50 mg by mouth every 6 (six) hours as needed.      .  lamoTRIgine (LAMICTAL) 200 MG tablet Take 200 mg by mouth daily.        . Nutritional Supplements (ESTROVEN PO) Take by mouth.      . potassium chloride SA (K-DUR,KLOR-CON) 20 MEQ tablet 1 tab daily and a second tab daily as needed for edema, sob,  135 tablet  1  . promethazine (PHENERGAN) 25 MG tablet Take 1 tablet (25 mg total) by mouth every 8 (eight) hours as needed for nausea.  40 tablet  2  . rOPINIRole (REQUIP) 1 MG tablet Take 1 mg by mouth daily.        . temazepam (RESTORIL) 15 MG capsule Take 1 capsule (15 mg total) by mouth at bedtime as needed for sleep.  30 capsule  1  . traMADol (ULTRAM) 50 MG tablet TAKE 2 TABLETS BY MOUTH EVERY 8 HOURS AS NEEDED FOR MODERATE TO SEVERE PAIN  120 tablet  0  . omeprazole (PRILOSEC) 40 MG capsule Take 40 mg by mouth daily.       No current facility-administered medications on file prior to visit.    Allergies  Allergen Reactions  . Carbamazepine Hives  . Ceclor [Cefaclor]     Tongue swell    Review of Systems  Review of Systems  Constitutional: Negative for fever and malaise/fatigue.  HENT: Negative for congestion.   Eyes: Negative for discharge.  Respiratory: Negative for shortness of breath.   Cardiovascular: Positive for leg swelling. Negative for chest pain and palpitations.  Gastrointestinal: Negative for nausea, abdominal pain and diarrhea.  Genitourinary: Negative for dysuria.  Musculoskeletal: Positive for back pain and joint pain. Negative for falls.  Skin: Negative for rash.  Neurological: Negative for loss of consciousness and headaches.  Endo/Heme/Allergies: Negative for polydipsia.  Psychiatric/Behavioral: Negative for depression and suicidal ideas. The patient is nervous/anxious. The patient does not have insomnia.     Objective  BP 98/46  Pulse 79  Temp(Src) 98.2 F (36.8 C) (Oral)  Ht 5\' 5"  (1.651 m)  Wt 205 lb 3.2 oz (93.078 kg)  BMI 34.15 kg/m2  SpO2 97%  LMP 10/03/2012  Physical Exam  Physical Exam   Constitutional: She is oriented to person, place, and time and well-developed, well-nourished, and in no distress. No distress.  HENT:  Head: Normocephalic and atraumatic.  Eyes: Conjunctivae are normal.  Neck: Neck supple. No thyromegaly present.  Cardiovascular: Normal rate and regular rhythm.   Murmur heard. Pulmonary/Chest: Effort normal and  breath sounds normal. She has no wheezes.  Abdominal: She exhibits no distension and no mass.  Musculoskeletal: She exhibits edema.  2 + pitting edema b/l.   Lymphadenopathy:    She has no cervical adenopathy.  Neurological: She is alert and oriented to person, place, and time.  Skin: Skin is warm and dry. No rash noted. She is not diaphoretic.  Psychiatric: Memory, affect and judgment normal.    Lab Results  Component Value Date   TSH 0.76 04/18/2014   Lab Results  Component Value Date   WBC 8.1 04/18/2014   HGB 15.4* 04/18/2014   HCT 47.3* 04/18/2014   MCV 86.1 04/18/2014   PLT 321.0 04/18/2014   Lab Results  Component Value Date   CREATININE 1.0 04/18/2014   BUN 20 04/18/2014   NA 139 04/18/2014   K 3.7 04/18/2014   CL 98 04/18/2014   CO2 29 04/18/2014   Lab Results  Component Value Date   ALT 47* 04/18/2014   AST 46* 04/18/2014   ALKPHOS 88 04/18/2014   BILITOT 0.5 04/18/2014   Lab Results  Component Value Date   CHOL 198 04/18/2014   Lab Results  Component Value Date   HDL 44.60 04/18/2014   Lab Results  Component Value Date   LDLCALC 117* 04/18/2014   Lab Results  Component Value Date   TRIG 181.0* 04/18/2014   Lab Results  Component Value Date   CHOLHDL 4 04/18/2014     Assessment & Plan  Edema extremities Increase Lasix for the next 3-5 days, minimize sodium, elevate feet frequently, try compression hose and report if no improvement.  GERD (gastroesophageal reflux disease) Avoid offending foods, start probiotics. Do not eat large meals in late evening and consider raising head of bed.   Pain Persistent right  shoulder and elbow and shoulder pain, s/p a fall roughly s weeks ago. Referred to ortho at this time.  Urinary frequency Urinalysis and c&s are negative.

## 2014-04-21 NOTE — Assessment & Plan Note (Signed)
Persistent right shoulder and elbow and shoulder pain, s/p a fall roughly s weeks ago. Referred to ortho at this time.

## 2014-04-21 NOTE — Assessment & Plan Note (Signed)
Encouraged increased hydration and fiber in diet. Daily probiotics. If bowels not moving can use MOM 2 tbls po in 4 oz of warm prune juice by mouth every 2-3 days. If no results then repeat in 4 hours with  Dulcolax suppository pr, may repeat again in 4 more hours as needed. Seek care if symptoms worsen. Consider daily Miralax and/or Dulcolax if symptoms persist.  

## 2014-04-21 NOTE — Assessment & Plan Note (Signed)
Patient has been previously diagnosed and is supposed to be using Bipap but does not. Encouraged to accept referral back to pulmonology but declines for now.

## 2014-04-21 NOTE — Assessment & Plan Note (Signed)
Patient encouraged to maintain heart healthy diet, regular exercise, adequate sleep. Consider daily probiotics. Take medications as prescribed 

## 2014-04-21 NOTE — Assessment & Plan Note (Signed)
Improving with increased lasix and lifestyle modifications.

## 2014-04-21 NOTE — Assessment & Plan Note (Signed)
Has been referred to ortho for evaluation of her right elbow and shoulder pain.

## 2014-04-21 NOTE — Assessment & Plan Note (Signed)
Encouraged DASH diet, decrease po intake and increase exercise as tolerated. Needs 7-8 hours of sleep nightly. Avoid trans fats, eat small, frequent meals every 4-5 hours with lean proteins, complex carbs and healthy fats. Minimize simple carbs, GMO foods. 

## 2014-04-21 NOTE — Progress Notes (Signed)
Patient ID: [name], female   DOB: [birth year], 58 y.o.   MRN: [id number] [name] [id number] February 26, 1956 04/21/2014      Progress Note-Follow Up  Subjective  Chief Complaint  Chief Complaint  Patient presents with  . Annual Exam    physical    HPI  Patient is a 58 year old female in today for routine medical care. In today for annual exam. Continues to struggle with pain, has already been referred to orhopaedics for evaluation of right arm pain which worsened after a fall rougly 6 weeks ago and is not resolving. Also struggling with low back pain which is long standing but has worsened since her fall. No incontinence. Has seen neurosurgery in the distant past. Her edema is improved with increased edema. She is struggling with b/l hip and groin pain as well. No recent illness. Denies CP/palp/SOB/HA/congestion/fevers/GI or GU c/o. Taking meds as prescribed  Past Medical History  Diagnosis Date  . Allergy     seasonal  . Anxiety   . Depression   . Hyperlipidemia   . History of proteinuria syndrome   . Edema extremities   . RLS (restless legs syndrome)   . Dyspareunia   . Obese   . Onychomycosis 05/09/2011  . Low back pain 05/09/2011  . RUQ pain 09/20/2011  . Hip pain, left 11/01/2011  . Lymphadenitis 06/16/2012  . Otitis externa 06/23/2012    left  . GERD (gastroesophageal reflux disease)   . Insomnia 03/11/2013  . Perimenopause 03/11/2013  . Acute sinusitis 03/27/2013  . Unspecified constipation 05/13/2013  . Arthritis 06/09/2013  . Urinary frequency 08/26/2013  . Oral lesion 08/26/2013  . Encounter for preventative adult health care exam with abnormal findings 04/21/2014  . Sleep apnea 04/21/2014    Past Surgical History  Procedure Laterality Date  . Cesarean section  1993  . Back surgery  2006    low, discectomy for herniated disc  . Tubal ligation  1993  . Carpal tunnel release Right 2006  . Bladder suspension N/A 11/07/2012    Procedure: [facility];   Surgeon: Malka So, MD;  Location: [facility];  Service: Urology;  Laterality: N/A;  . Cystoscopy N/A 11/07/2012    Procedure: CYSTOSCOPY;  Surgeon: Malka So, MD;  Location: [facility];  Service: Urology;  Laterality: N/A;    Family History  Problem Relation Age of Onset  . Cancer Mother 63    thyroid, malignant brain tumor  . Arthritis Mother     neck, back  . Hepatitis Sister     Hepatitis C  . Heart disease Sister     stent  . Diabetes Maternal Grandmother   . Arthritis Father     back pain  . Heart disease Father     History   Social History  . Marital Status: Married    Spouse Name: N/A    Number of Children: N/A  . Years of Education: N/A   Occupational History  . Not on file.   Social History Main Topics  . Smoking status: Former Smoker -- 0.50 packs/day for 20 years    Types: Cigarettes    Quit date: 07/12/1992  . Smokeless tobacco: Never Used  . Alcohol Use: Yes     Comment: occasionaly  . Drug Use: No  . Sexual Activity: Yes    Partners: Male   Other Topics Concern  . Not on file   Social History Narrative  . No  narrative on file    Current Outpatient Prescriptions on File Prior to Visit  Medication Sig Dispense Refill  . albuterol (PROVENTIL HFA;VENTOLIN HFA) 108 (90 BASE) MCG/ACT inhaler Inhale 2 puffs into the lungs every 6 (six) hours as needed for wheezing or shortness of breath.  1 Inhaler  2  . BIOTIN PO Take by mouth.      . Calcium Carbonate-Vitamin D (CALTRATE 600+D) 600-400 MG-UNIT per tablet Take 1 tablet by mouth daily.       . Cholecalciferol (VITAMIN D3) 1000 UNITS CAPS Take by mouth.        . conjugated estrogens (PREMARIN) vaginal cream Place vaginally as directed. 3 times a week at bedtime      . diazepam (VALIUM) 10 MG tablet Take 1 tablet (10 mg total) by mouth daily as needed for anxiety.  40 tablet  1  . FLAXSEED, LINSEED, PO Take by mouth every other day.       . fluticasone (FLONASE) 50  MCG/ACT nasal spray Place 2 sprays into both nostrils daily.  16 g  6  . HYDROcodone-acetaminophen (NORCO) 5-325 MG per tablet Take 1 tablet by mouth every 6 (six) hours as needed for moderate pain.  60 tablet  0  . hydrOXYzine (ATARAX/VISTARIL) 50 MG tablet Take 50 mg by mouth every 6 (six) hours as needed.      . lamoTRIgine (LAMICTAL) 200 MG tablet Take 200 mg by mouth daily.        . metolazone (ZAROXOLYN) 2.5 MG tablet Take 1 tablet (2.5 mg total) by mouth daily. Prn edema prior to lasix dose  30 tablet  1  . Nutritional Supplements (ESTROVEN PO) Take by mouth.      Marland Kitchen omeprazole (PRILOSEC) 40 MG capsule Take 40 mg by mouth daily.      . potassium chloride SA (K-DUR,KLOR-CON) 20 MEQ tablet 1 tab daily and a second tab daily as needed for edema, sob,  135 tablet  1  . promethazine (PHENERGAN) 25 MG tablet Take 1 tablet (25 mg total) by mouth every 8 (eight) hours as needed for nausea.  40 tablet  2  . rOPINIRole (REQUIP) 1 MG tablet Take 1 mg by mouth daily.        . sertraline (ZOLOFT) 50 MG tablet Take 50 mg by mouth daily.      . simvastatin (ZOCOR) 40 MG tablet Take 1 tablet (40 mg total) by mouth at bedtime.  90 tablet  1  . temazepam (RESTORIL) 15 MG capsule Take 1 capsule (15 mg total) by mouth at bedtime as needed for sleep.  30 capsule  1  . traMADol (ULTRAM) 50 MG tablet TAKE 2 TABLETS BY MOUTH EVERY 8 HOURS AS NEEDED FOR MODERATE TO SEVERE PAIN  120 tablet  0   No current facility-administered medications on file prior to visit.    Allergies  Allergen Reactions  . Carbamazepine Hives  . Ceclor [Cefaclor]     Tongue swell    Review of Systems  Review of Systems  Constitutional: Positive for malaise/fatigue. Negative for fever and chills.  HENT: Negative for congestion, hearing loss and nosebleeds.   Eyes: Negative for discharge.  Respiratory: Negative for cough, sputum production, shortness of breath and wheezing.   Cardiovascular: Positive for leg swelling. Negative for  chest pain and palpitations.  Gastrointestinal: Negative for heartburn, nausea, vomiting, abdominal pain, diarrhea, constipation and blood in stool.  Genitourinary: Negative for dysuria, urgency, frequency and hematuria.  Musculoskeletal: Positive for back pain,  joint pain and myalgias. Negative for falls.  Skin: Negative for rash.  Neurological: Positive for headaches. Negative for dizziness, tremors, sensory change, focal weakness, loss of consciousness and weakness.  Endo/Heme/Allergies: Negative for polydipsia. Does not bruise/bleed easily.  Psychiatric/Behavioral: Negative for depression and suicidal ideas. The patient is not nervous/anxious and does not have insomnia.     Objective  BP 106/69  Pulse 93  Temp(Src) 98.4 F (36.9 C) (Oral)  Ht 5\' 5"  (1.651 m)  Wt 198 lb 6.4 oz (89.994 kg)  BMI 33.02 kg/m2  SpO2 99%  LMP 10/03/2012  Physical Exam  Physical Exam  Constitutional: She is oriented to person, place, and time and well-developed, well-nourished, and in no distress. No distress.  HENT:  Head: Normocephalic and atraumatic.  Right Ear: External ear normal.  Left Ear: External ear normal.  Nose: Nose normal.  Mouth/Throat: Oropharynx is clear and moist. No oropharyngeal exudate.  Eyes: Conjunctivae are normal. Pupils are equal, round, and reactive to light. Right eye exhibits no discharge. Left eye exhibits no discharge. No scleral icterus.  Neck: Normal range of motion. Neck supple. No thyromegaly present.  Cardiovascular: Normal rate, regular rhythm, normal heart sounds and intact distal pulses.   No murmur heard. Pulmonary/Chest: Effort normal and breath sounds normal. No respiratory distress. She has no wheezes. She has no rales.  Abdominal: Soft. Bowel sounds are normal. She exhibits no distension and no mass. There is no tenderness.  Musculoskeletal: Normal range of motion. She exhibits no edema and no tenderness.  Lymphadenopathy:    She has no cervical  adenopathy.  Neurological: She is alert and oriented to person, place, and time. She has normal reflexes. No cranial nerve deficit. Coordination normal.  Skin: Skin is warm and dry. No rash noted. She is not diaphoretic.  Psychiatric: Mood, memory and affect normal.    Lab Results  Component Value Date   TSH 0.76 04/18/2014   Lab Results  Component Value Date   WBC 8.1 04/18/2014   HGB 15.4* 04/18/2014   HCT 47.3* 04/18/2014   MCV 86.1 04/18/2014   PLT 321.0 04/18/2014   Lab Results  Component Value Date   CREATININE 1.0 04/18/2014   BUN 20 04/18/2014   NA 139 04/18/2014   K 3.7 04/18/2014   CL 98 04/18/2014   CO2 29 04/18/2014   Lab Results  Component Value Date   ALT 47* 04/18/2014   AST 46* 04/18/2014   ALKPHOS 88 04/18/2014   BILITOT 0.5 04/18/2014   Lab Results  Component Value Date   CHOL 198 04/18/2014   Lab Results  Component Value Date   HDL 44.60 04/18/2014   Lab Results  Component Value Date   LDLCALC 117* 04/18/2014   Lab Results  Component Value Date   TRIG 181.0* 04/18/2014   Lab Results  Component Value Date   CHOLHDL 4 04/18/2014     Assessment & Plan  GERD (gastroesophageal reflux disease) Avoid offending foods, start probiotics. Do not eat large meals in late evening and consider raising head of bed.   Pain Has been referred to ortho for evaluation of her right elbow and shoulder pain.   Constipation Encouraged increased hydration and fiber in diet. Daily probiotics. If bowels not moving can use MOM 2 tbls po in 4 oz of warm prune juice by mouth every 2-3 days. If no results then repeat in 4 hours with  Dulcolax suppository pr, may repeat again in 4 more hours as needed. Seek care if  symptoms worsen. Consider daily Miralax and/or Dulcolax if symptoms persist.   Low back pain Worsening, with notable disease will refer to neurosurgeon for further consideration.  Obese Encouraged DASH diet, decrease po intake and increase exercise as tolerated. Needs  7-8 hours of sleep nightly. Avoid trans fats, eat small, frequent meals every 4-5 hours with lean proteins, complex carbs and healthy fats. Minimize simple carbs, GMO foods.  Edema extremities Improving with increased lasix and lifestyle modifications.  Preventative health care Patient encouraged to maintain heart healthy diet, regular exercise, adequate sleep. Consider daily probiotics. Take medications as prescribed  Sleep apnea Patient has been previously diagnosed and is supposed to be using Bipap but does not. Encouraged to accept referral back to pulmonology but declines for now.

## 2014-04-21 NOTE — Assessment & Plan Note (Signed)
Avoid offending foods, start probiotics. Do not eat large meals in late evening and consider raising head of bed.  

## 2014-04-21 NOTE — Assessment & Plan Note (Signed)
Urinalysis and c&s are negative.

## 2014-04-23 ENCOUNTER — Encounter: Payer: Self-pay | Admitting: Gastroenterology

## 2014-04-24 ENCOUNTER — Ambulatory Visit (INDEPENDENT_AMBULATORY_CARE_PROVIDER_SITE_OTHER): Payer: BC Managed Care – PPO | Admitting: Physician Assistant

## 2014-04-24 ENCOUNTER — Encounter: Payer: Self-pay | Admitting: Physician Assistant

## 2014-04-24 VITALS — HR 76 | Temp 97.9°F | Resp 16 | Ht 65.0 in | Wt 195.0 lb

## 2014-04-24 DIAGNOSIS — J019 Acute sinusitis, unspecified: Secondary | ICD-10-CM | POA: Insufficient documentation

## 2014-04-24 DIAGNOSIS — K5900 Constipation, unspecified: Secondary | ICD-10-CM

## 2014-04-24 MED ORDER — HYDROCOD POLST-CHLORPHEN POLST 10-8 MG/5ML PO LQCR
5.0000 mL | Freq: Two times a day (BID) | ORAL | Status: DC | PRN
Start: 2014-04-24 — End: 2014-12-10

## 2014-04-24 MED ORDER — MOXIFLOXACIN HCL 400 MG PO TABS: 400.0000 mg | ORAL_TABLET | Freq: Every day | ORAL | Status: DC

## 2014-04-24 NOTE — Progress Notes (Signed)
Pre visit review using our clinic review tool, if applicable. No additional management support is needed unless otherwise documented below in the visit note/SLS  

## 2014-04-24 NOTE — Assessment & Plan Note (Signed)
Rx Avelox.  Increase fluids.  Rest.  Saline nasal spray.  Probiotic.  Mucinex as directed.  Humidifier in bedroom. AFrin nasal spray for 2 days maximum.  Call or return to clinic if symptoms are not improving.

## 2014-04-24 NOTE — Progress Notes (Signed)
History of Present Illness: [name] is a 58 y.o. female who present to the clinic today complaining of sinus pressure, sinus pain and nasal congestion with non-productive cough x 4 weeks. Patient endorses tooth pain and ear pain bilaterally. Patient endorses some intermittent chest tightness and fatigue.  Patient denies pleuritic chest pain. O2 on room air at 97%. Patient is a non-smoker.  Patient also complains of significant constipation since starting pain medication.  No BM in 2 days.  History: Past Medical History  Diagnosis Date  . Allergy     seasonal  . Anxiety   . Depression   . Hyperlipidemia   . History of proteinuria syndrome   . Edema extremities   . RLS (restless legs syndrome)   . Dyspareunia   . Obese   . Onychomycosis 05/09/2011  . Low back pain 05/09/2011  . RUQ pain 09/20/2011  . Hip pain, left 11/01/2011  . Lymphadenitis 06/16/2012  . Otitis externa 06/23/2012    left  . GERD (gastroesophageal reflux disease)   . Insomnia 03/11/2013  . Perimenopause 03/11/2013  . Acute sinusitis 03/27/2013  . Unspecified constipation 05/13/2013  . Arthritis 06/09/2013  . Urinary frequency 08/26/2013  . Oral lesion 08/26/2013  . Encounter for preventative adult health care exam with abnormal findings 04/21/2014  . Sleep apnea 04/21/2014   Current outpatient prescriptions:albuterol (PROVENTIL HFA;VENTOLIN HFA) 108 (90 BASE) MCG/ACT inhaler, Inhale 2 puffs into the lungs every 6 (six) hours as needed for wheezing or shortness of breath., Disp: 1 Inhaler, Rfl: 2;  baclofen (LIORESAL) 20 MG tablet, Take 1 tablet (20 mg total) by mouth at bedtime as needed for muscle spasms., Disp: 90 tablet, Rfl: 5;  BIOTIN PO, Take by mouth., Disp: , Rfl:  Calcium Carbonate-Vitamin D (CALTRATE 600+D) 600-400 MG-UNIT per tablet, Take 1 tablet by mouth daily. , Disp: , Rfl: ;  Cholecalciferol (VITAMIN D3) 1000 UNITS CAPS, Take by mouth.  , Disp: , Rfl: ;  conjugated estrogens (PREMARIN) vaginal  cream, Place vaginally as directed. 3 times a week at bedtime, Disp: , Rfl: ;  diazepam (VALIUM) 10 MG tablet, Take 1 tablet (10 mg total) by mouth daily as needed for anxiety., Disp: 40 tablet, Rfl: 1 FLAXSEED, LINSEED, PO, Take by mouth every other day. , Disp: , Rfl: ;  fluticasone (FLONASE) 50 MCG/ACT nasal spray, Place 2 sprays into both nostrils daily., Disp: 16 g, Rfl: 6;  furosemide (LASIX) 40 MG tablet, 1 tab po daily and a 2 nd tab prn increased edema, Disp: 135 tablet, Rfl: 1;  HYDROcodone-acetaminophen (NORCO) 5-325 MG per tablet, Take 1 tablet by mouth every 6 (six) hours as needed for moderate pain., Disp: 60 tablet, Rfl: 0 hydrOXYzine (ATARAX/VISTARIL) 50 MG tablet, Take 50 mg by mouth every 6 (six) hours as needed., Disp: , Rfl: ;  lamoTRIgine (LAMICTAL) 200 MG tablet, Take 200 mg by mouth daily.  , Disp: , Rfl: ;  metolazone (ZAROXOLYN) 2.5 MG tablet, Take 1 tablet (2.5 mg total) by mouth daily. Prn edema prior to lasix dose, Disp: 30 tablet, Rfl: 1;  Nutritional Supplements (ESTROVEN PO), Take by mouth., Disp: , Rfl:  pantoprazole (PROTONIX) 40 MG tablet, Take 1 tablet (40 mg total) by mouth daily., Disp: 90 tablet, Rfl: 1;  potassium chloride SA (K-DUR,KLOR-CON) 20 MEQ tablet, 1 tab daily and a second tab daily as needed for edema, sob,, Disp: 135 tablet, Rfl: 1;  promethazine (PHENERGAN) 25 MG tablet, Take 1 tablet (25 mg total) by mouth every  8 (eight) hours as needed for nausea., Disp: 40 tablet, Rfl: 2 ranitidine (ZANTAC) 300 MG tablet, Take 1 tablet (300 mg total) by mouth at bedtime., Disp: 30 tablet, Rfl: 3;  rOPINIRole (REQUIP) 1 MG tablet, Take 1 mg by mouth daily.  , Disp: , Rfl: ;  sertraline (ZOLOFT) 50 MG tablet, Take 50 mg by mouth daily., Disp: , Rfl: ;  simvastatin (ZOCOR) 40 MG tablet, Take 1 tablet (40 mg total) by mouth at bedtime., Disp: 90 tablet, Rfl: 1 temazepam (RESTORIL) 15 MG capsule, Take 1 capsule (15 mg total) by mouth at bedtime as needed for sleep., Disp: 30  capsule, Rfl: 1;  traMADol (ULTRAM) 50 MG tablet, TAKE 2 TABLETS BY MOUTH EVERY 8 HOURS AS NEEDED FOR MODERATE TO SEVERE PAIN, Disp: 120 tablet, Rfl: 0;  chlorpheniramine-HYDROcodone (TUSSIONEX PENNKINETIC ER) 10-8 MG/5ML LQCR, Take 5 mLs by mouth every 12 (twelve) hours as needed., Disp: 115 mL, Rfl: 0 moxifloxacin (AVELOX) 400 MG tablet, Take 1 tablet (400 mg total) by mouth daily at 8 pm., Disp: 10 tablet, Rfl: 0 Allergies  Allergen Reactions  . Carbamazepine Hives  . Ceclor [Cefaclor]     Tongue swell   Family History  Problem Relation Age of Onset  . Cancer Mother 101    thyroid, malignant brain tumor  . Arthritis Mother     neck, back  . Hepatitis Sister     Hepatitis C  . Heart disease Sister     stent  . Diabetes Maternal Grandmother   . Arthritis Father     back pain  . Heart disease Father    History   Social History  . Marital Status: Married    Spouse Name: N/A    Number of Children: N/A  . Years of Education: N/A   Social History Main Topics  . Smoking status: Former Smoker -- 0.50 packs/day for 20 years    Types: Cigarettes    Quit date: 07/12/1992  . Smokeless tobacco: Never Used  . Alcohol Use: Yes     Comment: occasionaly  . Drug Use: No  . Sexual Activity: Yes    Partners: Male   Other Topics Concern  . None   Social History Narrative  . None   Review of Systems: See HPI.  All other ROS are negative.  Physical Examination: Pulse 76  Temp(Src) 97.9 F (36.6 C) (Oral)  Resp 16  Ht 5\' 5"  (1.651 m)  Wt 195 lb (88.451 kg)  BMI 32.45 kg/m2  SpO2 97%  LMP 10/03/2012  General appearance: alert, cooperative and appears stated age Head: Normocephalic, without obvious abnormality, atraumatic, sinuses tender to percussion Eyes: conjunctivae/corneas clear. PERRL, EOM's intact. Fundi benign. Ears: normal TM's and external ear canals both ears Nose: moderate congestion, turbinates swollen, sinus tenderness bilateral maxillary sinuses. Throat: lips,  mucosa, and tongue normal; teeth and gums normal Neck: no adenopathy, no carotid bruit, no JVD, supple, symmetrical, trachea midline and thyroid not enlarged, symmetric, no tenderness/mass/nodules Lungs: clear to auscultation bilaterally Chest wall: no tenderness  Labs/Diagnostics: None performed at today's visit.  Assessment/Plan: Acute sinusitis with symptoms > 10 days Rx Avelox.  Increase fluids.  Rest.  Saline nasal spray.  Probiotic.  Mucinex as directed.  Humidifier in bedroom. AFrin nasal spray for 2 days maximum.  Call or return to clinic if symptoms are not improving.    CN (constipation) Likely related to new opiod.  Stool softener daily.  Encouraged warm prune juice or Miralax tonight.  If still no  BM, fleet's enema x 1.  If no success, return to office.

## 2014-04-24 NOTE — Assessment & Plan Note (Signed)
Likely related to new opiod.  Stool softener daily.  Encouraged warm prune juice or Miralax tonight.  If still no BM, fleet's enema x 1.  If no success, return to office.

## 2014-04-24 NOTE — Patient Instructions (Signed)
Please take antibiotic as directed.  Increase fluid intake.  Use Saline nasal spray.  Take a daily multivitamin. Afrin nasal spray for 2 days.  Apply topical Aspercreme to neck and chest to help with muscular pain from coughing.  Place a humidifier in the bedroom. Use Warm prune juice and/or Miralax to help promote bowel movement. If no bowel movement by tonight, use a Fleet's enema.  If still no bowel movement, let us know. Please call or return clinic if symptoms are not improving.  Sinusitis Sinusitis is redness, soreness, and swelling (inflammation) of the paranasal sinuses. Paranasal sinuses are air pockets within the bones of your face (beneath the eyes, the middle of the forehead, or above the eyes). In healthy paranasal sinuses, mucus is able to drain out, and air is able to circulate through them by way of your nose. However, when your paranasal sinuses are inflamed, mucus and air can become trapped. This can allow bacteria and other germs to grow and cause infection. Sinusitis can develop quickly and last only a short time (acute) or continue over a long period (chronic). Sinusitis that lasts for more than 12 weeks is considered chronic.  CAUSES  Causes of sinusitis include:  Allergies.  Structural abnormalities, such as displacement of the cartilage that separates your nostrils (deviated septum), which can decrease the air flow through your nose and sinuses and affect sinus drainage.  Functional abnormalities, such as when the small hairs (cilia) that line your sinuses and help remove mucus do not work properly or are not present. SYMPTOMS  Symptoms of acute and chronic sinusitis are the same. The primary symptoms are pain and pressure around the affected sinuses. Other symptoms include:  Upper toothache.  Earache.  Headache.  Bad breath.  Decreased sense of smell and taste.  A cough, which worsens when you are lying flat.  Fatigue.  Fever.  Thick drainage from your nose,  which often is green and may contain pus (purulent).  Swelling and warmth over the affected sinuses. DIAGNOSIS  Your caregiver will perform a physical exam. During the exam, your caregiver may:  Look in your nose for signs of abnormal growths in your nostrils (nasal polyps).  Tap over the affected sinus to check for signs of infection.  View the inside of your sinuses (endoscopy) with a special imaging device with a light attached (endoscope), which is inserted into your sinuses. If your caregiver suspects that you have chronic sinusitis, one or more of the following tests may be recommended:  Allergy tests.  Nasal culture A sample of mucus is taken from your nose and sent to a lab and screened for bacteria.  Nasal cytology A sample of mucus is taken from your nose and examined by your caregiver to determine if your sinusitis is related to an allergy. TREATMENT  Most cases of acute sinusitis are related to a viral infection and will resolve on their own within 10 days. Sometimes medicines are prescribed to help relieve symptoms (pain medicine, decongestants, nasal steroid sprays, or saline sprays).  However, for sinusitis related to a bacterial infection, your caregiver will prescribe antibiotic medicines. These are medicines that will help kill the bacteria causing the infection.  Rarely, sinusitis is caused by a fungal infection. In theses cases, your caregiver will prescribe antifungal medicine. For some cases of chronic sinusitis, surgery is needed. Generally, these are cases in which sinusitis recurs more than 3 times per year, despite other treatments. HOME CARE INSTRUCTIONS   Drink plenty of water.  Water helps thin the mucus so your sinuses can drain more easily.  Use a humidifier.  Inhale steam 3 to 4 times a day (for example, sit in the bathroom with the shower running).  Apply a warm, moist washcloth to your face 3 to 4 times a day, or as directed by your caregiver.  Use  saline nasal sprays to help moisten and clean your sinuses.  Take over-the-counter or prescription medicines for pain, discomfort, or fever only as directed by your caregiver. SEEK IMMEDIATE MEDICAL CARE IF:  You have increasing pain or severe headaches.  You have nausea, vomiting, or drowsiness.  You have swelling around your face.  You have vision problems.  You have a stiff neck.  You have difficulty breathing. MAKE SURE YOU:   Understand these instructions.  Will watch your condition.  Will get help right away if you are not doing well or get worse. Document Released: 06/28/2005 Document Revised: 09/20/2011 Document Reviewed: 07/13/2011 [facility] Patient Information 2014 [place], [place].

## 2014-04-25 ENCOUNTER — Other Ambulatory Visit: Payer: Self-pay | Admitting: *Deleted

## 2014-04-26 ENCOUNTER — Other Ambulatory Visit (INDEPENDENT_AMBULATORY_CARE_PROVIDER_SITE_OTHER): Payer: BC Managed Care – PPO

## 2014-04-26 ENCOUNTER — Telehealth: Payer: Self-pay

## 2014-04-26 DIAGNOSIS — E876 Hypokalemia: Secondary | ICD-10-CM

## 2014-04-26 LAB — RENAL FUNCTION PANEL
Albumin: 4.3 g/dL (ref 3.5–5.2)
BUN: 22 mg/dL (ref 6–23)
CALCIUM: 10.2 mg/dL (ref 8.4–10.5)
CO2: 37 mEq/L — ABNORMAL HIGH (ref 19–32)
CREATININE: 1 mg/dL (ref 0.4–1.2)
Chloride: 84 mEq/L — ABNORMAL LOW (ref 96–112)
GFR: 59.51 mL/min — ABNORMAL LOW (ref >60.00)
Glucose, Bld: 106 mg/dL — ABNORMAL HIGH (ref 70–99)
PHOSPHORUS: 2.3 mg/dL (ref 2.3–4.6)
Potassium: 2.6 mEq/L — CL (ref 3.5–5.1)
Sodium: 134 mEq/L — ABNORMAL LOW (ref 135–145)

## 2014-04-26 MED ORDER — POTASSIUM CHLORIDE CRYS ER 20 MEQ PO TBCR: 20.0000 meq | EXTENDED_RELEASE_TABLET | ORAL | Status: DC

## 2014-04-26 NOTE — Telephone Encounter (Signed)
See lab note.  

## 2014-04-26 NOTE — Telephone Encounter (Signed)
Critical lab: potassium low at 2.6

## 2014-04-26 NOTE — Telephone Encounter (Signed)
Needs replacement of her potassium

## 2014-04-29 LAB — PTH, INTACT AND CALCIUM
Calcium: 10.3 mg/dL (ref 8.4–10.5)
PTH: 61 pg/mL (ref 14–64)

## 2014-04-29 NOTE — Telephone Encounter (Signed)
Noted on labs.

## 2014-05-07 ENCOUNTER — Ambulatory Visit: Payer: BC Managed Care – PPO | Admitting: Dietician

## 2014-05-07 ENCOUNTER — Other Ambulatory Visit: Payer: Self-pay | Admitting: Neurosurgery

## 2014-05-07 DIAGNOSIS — M5416 Radiculopathy, lumbar region: Secondary | ICD-10-CM

## 2014-05-08 ENCOUNTER — Encounter: Payer: Self-pay | Admitting: Neurology

## 2014-05-09 ENCOUNTER — Telehealth: Payer: Self-pay | Admitting: Family Medicine

## 2014-05-09 DIAGNOSIS — R52 Pain, unspecified: Secondary | ICD-10-CM

## 2014-05-09 NOTE — Telephone Encounter (Signed)
°  Caller name:Decoste, Krystian Ferrentino Relation to pt: self  Call back number: [phone] Pharmacy:  Reason for call:   Pt requesting a refill pt states she is taking 3x a day due to the back pain and shoulder pain.

## 2014-05-09 NOTE — Telephone Encounter (Signed)
What medication

## 2014-05-10 MED ORDER — HYDROCODONE-ACETAMINOPHEN 5-325 MG PO TABS: 1.0000 | ORAL_TABLET | Freq: Four times a day (QID) | ORAL | Status: DC | PRN

## 2014-05-10 NOTE — Telephone Encounter (Signed)
Patient came in to get her Hydrocodone refill. I'm assuming this is the medication that she called about yesterday?  Note below states she is taking 3 times a day due to back and shoulder pain?  Last RX was wrote on 04-16-14 quantity 60 with 0 refills  Please advise?

## 2014-05-10 NOTE — Telephone Encounter (Signed)
She is not due til next week she can have a refill on the Hydrocone on 05/16/14

## 2014-05-10 NOTE — Telephone Encounter (Signed)
Pt informed and she voiced understanding  Pt informed that she can pick up on 05-16-14   RX at front desk

## 2014-05-16 ENCOUNTER — Encounter: Payer: Self-pay | Admitting: Family Medicine

## 2014-05-17 ENCOUNTER — Ambulatory Visit
Admission: RE | Admit: 2014-05-17 | Discharge: 2014-05-17 | Disposition: A | Discharge status: Home / Self Care | Payer: BC Managed Care – PPO | Source: Ambulatory Visit | Attending: Neurosurgery | Admitting: Neurosurgery

## 2014-05-17 DIAGNOSIS — M5416 Radiculopathy, lumbar region: Secondary | ICD-10-CM

## 2014-05-17 MED ORDER — GADOBENATE DIMEGLUMINE 529 MG/ML IV SOLN
18.0000 mL | Freq: Once | INTRAVENOUS | Status: AC | PRN
  Administered 2014-05-17: 18 mL via INTRAVENOUS

## 2014-06-03 ENCOUNTER — Telehealth: Payer: Self-pay | Admitting: *Deleted

## 2014-06-03 NOTE — Telephone Encounter (Signed)
Rx Refill Request: Medication Detail      Disp Refills Start End     diazepam (VALIUM) 10 MG tablet 40 tablet 1 01/15/2014     Sig - Route: Take 1 tablet (10 mg total) by mouth daily as needed for anxiety. - Oral    Class: Print     Follow-up and Disposition  04/18/2014 AVS   Return in about 3 months (around 07/19/2014).

## 2014-06-04 ENCOUNTER — Telehealth: Payer: Self-pay | Admitting: Family Medicine

## 2014-06-04 DIAGNOSIS — G4700 Insomnia, unspecified: Secondary | ICD-10-CM

## 2014-06-04 MED ORDER — DIAZEPAM 10 MG PO TABS: 10.0000 mg | ORAL_TABLET | Freq: Every day | ORAL | Status: DC | PRN

## 2014-06-04 NOTE — Telephone Encounter (Signed)
Caller name: Tressie, Ragin Relation to XN:TZGY  Call back [place]   Reason for call:   Pt is requesting a refill diazepam (VALIUM) 10 MG tablet

## 2014-06-04 NOTE — Telephone Encounter (Signed)
rx printed for md to sign and fax 

## 2014-06-13 ENCOUNTER — Other Ambulatory Visit: Payer: Self-pay | Admitting: Family Medicine

## 2014-06-20 ENCOUNTER — Other Ambulatory Visit: Payer: Self-pay

## 2014-06-20 DIAGNOSIS — J209 Acute bronchitis, unspecified: Secondary | ICD-10-CM

## 2014-06-20 DIAGNOSIS — R110 Nausea: Secondary | ICD-10-CM

## 2014-06-20 MED ORDER — PROMETHAZINE HCL 25 MG PO TABS: 25.0000 mg | ORAL_TABLET | Freq: Three times a day (TID) | ORAL | Status: DC | PRN

## 2014-08-16 ENCOUNTER — Other Ambulatory Visit: Payer: Self-pay | Admitting: Family Medicine

## 2014-08-16 DIAGNOSIS — E876 Hypokalemia: Secondary | ICD-10-CM

## 2014-08-16 NOTE — Telephone Encounter (Signed)
Patient requesting Metolazone- Rx is for 1 daily PRN for edema (last refilled 06/13/14 for 30 with 1).   Last office visit was 04/18/14 for CPE (and was in 04/16/14 for edema).   Labs on 04/26/14 showed K of 2.6 and patient never came back for recheck, but was prescribed KCL at that time.    Please advise.

## 2014-08-17 NOTE — Telephone Encounter (Signed)
Thanks, please have her come in for CMP for hypokalemia and get a UDS if she needs one then she can have the Zaroxoyn. Confirm she is taking the KCL please

## 2014-08-19 NOTE — Addendum Note (Signed)
Addended by: Leticia Penna A on: 08/19/2014 09:26 AM   Modules accepted: Orders

## 2014-08-19 NOTE — Telephone Encounter (Signed)
Patient scheduled for CMET 08/20/14 at 1:15.  Patient states she is taking her potassium supplement.  UDS completed on 04/18/14- high risk.

## 2014-08-20 ENCOUNTER — Other Ambulatory Visit (INDEPENDENT_AMBULATORY_CARE_PROVIDER_SITE_OTHER): Payer: BLUE CROSS/BLUE SHIELD

## 2014-08-20 DIAGNOSIS — E876 Hypokalemia: Secondary | ICD-10-CM | POA: Diagnosis not present

## 2014-08-20 LAB — COMPREHENSIVE METABOLIC PANEL
ALT: 22 U/L (ref 0–35)
AST: 18 U/L (ref 0–37)
Albumin: 4.7 g/dL (ref 3.5–5.2)
Alkaline Phosphatase: 76 U/L (ref 39–117)
BUN: 21 mg/dL (ref 6–23)
CALCIUM: 9.9 mg/dL (ref 8.4–10.5)
CHLORIDE: 97 meq/L (ref 96–112)
CO2: 34 meq/L — AB (ref 19–32)
Creatinine, Ser: 0.83 mg/dL (ref 0.40–1.20)
GFR: 75.41 mL/min (ref >60.00)
Glucose, Bld: 70 mg/dL (ref 70–99)
POTASSIUM: 3.1 meq/L — AB (ref 3.5–5.1)
SODIUM: 138 meq/L (ref 135–145)
TOTAL PROTEIN: 7.7 g/dL (ref 6.0–8.3)
Total Bilirubin: 0.8 mg/dL (ref 0.2–1.2)

## 2014-08-22 ENCOUNTER — Telehealth: Payer: Self-pay | Admitting: Family Medicine

## 2014-08-22 DIAGNOSIS — E876 Hypokalemia: Secondary | ICD-10-CM

## 2014-08-22 MED ORDER — POTASSIUM CHLORIDE CRYS ER 20 MEQ PO TBCR: EXTENDED_RELEASE_TABLET | ORAL | Status: DC

## 2014-08-22 NOTE — Telephone Encounter (Signed)
Lab ordered.

## 2014-08-28 ENCOUNTER — Other Ambulatory Visit (INDEPENDENT_AMBULATORY_CARE_PROVIDER_SITE_OTHER): Payer: BLUE CROSS/BLUE SHIELD

## 2014-08-28 DIAGNOSIS — E876 Hypokalemia: Secondary | ICD-10-CM

## 2014-08-28 LAB — COMPREHENSIVE METABOLIC PANEL
ALBUMIN: 4.2 g/dL (ref 3.5–5.2)
ALK PHOS: 64 U/L (ref 39–117)
ALT: 17 U/L (ref 0–35)
AST: 17 U/L (ref 0–37)
BUN: 29 mg/dL — ABNORMAL HIGH (ref 6–23)
CALCIUM: 9.4 mg/dL (ref 8.4–10.5)
CO2: 27 mEq/L (ref 19–32)
Chloride: 107 mEq/L (ref 96–112)
Creatinine, Ser: 0.86 mg/dL (ref 0.40–1.20)
GFR: 72.38 mL/min (ref >60.00)
GLUCOSE: 79 mg/dL (ref 70–99)
POTASSIUM: 4.5 meq/L (ref 3.5–5.1)
SODIUM: 138 meq/L (ref 135–145)
Total Bilirubin: 0.6 mg/dL (ref 0.2–1.2)
Total Protein: 6.9 g/dL (ref 6.0–8.3)

## 2014-08-29 ENCOUNTER — Other Ambulatory Visit: Payer: Self-pay

## 2014-09-10 ENCOUNTER — Other Ambulatory Visit: Payer: Self-pay

## 2014-09-10 DIAGNOSIS — Z1231 Encounter for screening mammogram for malignant neoplasm of breast: Secondary | ICD-10-CM

## 2014-09-12 ENCOUNTER — Other Ambulatory Visit: Payer: Self-pay | Admitting: Family Medicine

## 2014-09-12 DIAGNOSIS — G4700 Insomnia, unspecified: Secondary | ICD-10-CM

## 2014-09-12 MED ORDER — DIAZEPAM 10 MG PO TABS
10.0000 mg | ORAL_TABLET | Freq: Every day | ORAL | Status: DC | PRN
Start: 2014-09-12 — End: 2014-12-10

## 2014-09-12 NOTE — Telephone Encounter (Signed)
i have approved but she needs UDS

## 2014-09-12 NOTE — Telephone Encounter (Signed)
Faxed hardcopy for Valium to [employer] [facility]

## 2014-09-13 ENCOUNTER — Other Ambulatory Visit: Payer: Self-pay | Admitting: Family Medicine

## 2014-09-13 MED ORDER — POTASSIUM CHLORIDE CRYS ER 20 MEQ PO TBCR: EXTENDED_RELEASE_TABLET | ORAL | Status: DC

## 2014-09-17 ENCOUNTER — Ambulatory Visit (INDEPENDENT_AMBULATORY_CARE_PROVIDER_SITE_OTHER): Payer: BLUE CROSS/BLUE SHIELD | Admitting: Medical

## 2014-09-17 ENCOUNTER — Encounter: Payer: Self-pay | Admitting: Medical

## 2014-09-17 ENCOUNTER — Telehealth: Payer: Self-pay | Admitting: *Deleted

## 2014-09-17 VITALS — BP 112/70 | HR 76 | Temp 98.1°F | Ht 65.0 in | Wt 191.6 lb

## 2014-09-17 DIAGNOSIS — J029 Acute pharyngitis, unspecified: Secondary | ICD-10-CM

## 2014-09-17 DIAGNOSIS — K140 Glossitis: Secondary | ICD-10-CM | POA: Insufficient documentation

## 2014-09-17 LAB — POCT RAPID STREP A (OFFICE): RAPID STREP A SCREEN: NEGATIVE

## 2014-09-17 MED ORDER — FLUCONAZOLE 150 MG PO TABS: ORAL_TABLET | ORAL | Status: DC

## 2014-09-17 MED ORDER — MAGIC MOUTHWASH: ORAL | Status: DC

## 2014-09-17 MED ORDER — AZITHROMYCIN 250 MG PO TABS: ORAL_TABLET | ORAL | Status: DC

## 2014-09-17 NOTE — Patient Instructions (Signed)
Acute pharyngitis Pt appearance and swollen lymph nodes, I do think azithromycin would be beneficial.    Glossitis With mild burning sensation at times. Will rx magic mouthwash. Also provide diflucan 150 mg tabs to use 1 tab today and another in 5 days.   Since she was on antibiotic 2 weeks ago.(Possible fungal component and I am giving antibiotic today for suspicious appearing posterior pharynx)  If symptoms persist despite tx today then consider labs work up for burning mouth syndrome.    Regarding pharyngitis instructions above. Your strep test was negative. However, your physical exam and clinical presentation is moderate  suspicious for strep and it is important to note that rapid strep test can be falsely negative. So I am going to give you a antibiotic today based on your exam and clinical presentation.  Rest hydrate, tylenol for fever, and warm salt water gargles.   Follow up in 7 days or as needed.

## 2014-09-17 NOTE — Assessment & Plan Note (Signed)
With mild burning sensation at times. Will rx magic mouthwash. Also provide diflucan 150 mg tabs to use 1 tab today and another in 5 days.   Since she was on antibiotic 2 weeks ago.(Possible fungal component and I am giving antibiotic today for suspicious appearing posterior pharynx)  If symptoms persist despite tx today then consider labs work up for burning mouth syndrome.

## 2014-09-17 NOTE — Progress Notes (Signed)
Pre visit review using our clinic review tool, if applicable. No additional management support is needed unless otherwise documented below in the visit note. 

## 2014-09-17 NOTE — Telephone Encounter (Signed)
Prior authorization for magic mouthwash initiated. Awaiting determination. JG//CMA

## 2014-09-17 NOTE — Assessment & Plan Note (Signed)
Pt appearance and swollen lymph nodes, I do think azithromycin would be beneficial.

## 2014-09-17 NOTE — Progress Notes (Signed)
Subjective:    Patient ID: [name], female    DOB: 1955/12/06, 59 y.o.   MRN: [id number]  HPI  Pt in with some burning of her tongue. Also some pain when she swollows. Mild lymph nodes in neck faint tender. Pt reports some faint chills. Some pain in tongue and throat when swallows.  Pt has tongue pain for 15 days. 5 days submandibular nodes swollen and mild pain throat on swallowing for 5 days.   Review of Systems  Constitutional: Positive for chills.  HENT:       Tongue burning sensation at times.  Respiratory: Negative for cough, chest tightness and wheezing.   Cardiovascular: Negative for chest pain and palpitations.  Gastrointestinal: Negative for nausea, vomiting, abdominal pain, diarrhea, constipation, blood in stool and anal bleeding.  Neurological: Negative for dizziness, seizures, syncope, facial asymmetry, weakness and headaches.  Hematological: Positive for adenopathy.  Psychiatric/Behavioral: Negative for behavioral problems and confusion.   Past Medical History  Diagnosis Date  . Allergy     seasonal  . Anxiety   . Depression   . Hyperlipidemia   . History of proteinuria syndrome   . Edema extremities   . RLS (restless legs syndrome)   . Dyspareunia   . Obese   . Onychomycosis 05/09/2011  . Low back pain 05/09/2011  . RUQ pain 09/20/2011  . Hip pain, left 11/01/2011  . Lymphadenitis 06/16/2012  . Otitis externa 06/23/2012    left  . GERD (gastroesophageal reflux disease)   . Insomnia 03/11/2013  . Perimenopause 03/11/2013  . Acute sinusitis 03/27/2013  . Unspecified constipation 05/13/2013  . Arthritis 06/09/2013  . Urinary frequency 08/26/2013  . Oral lesion 08/26/2013  . Encounter for preventative adult health care exam with abnormal findings 04/21/2014  . Sleep apnea 04/21/2014    History   Social History  . Marital Status: Married    Spouse Name: N/A  . Number of Children: N/A  . Years of Education: N/A   Occupational History  . Not on  file.   Social History Main Topics  . Smoking status: Former Smoker -- 0.50 packs/day for 20 years    Types: Cigarettes    Quit date: 07/12/1992  . Smokeless tobacco: Never Used  . Alcohol Use: Yes     Comment: occasionaly  . Drug Use: No  . Sexual Activity:    Partners: Male   Other Topics Concern  . Not on file   Social History Narrative    Past Surgical History  Procedure Laterality Date  . Cesarean section  1993  . Back surgery  2006    low, discectomy for herniated disc  . Tubal ligation  1993  . Carpal tunnel release Right 2006  . Bladder suspension N/A 11/07/2012    Procedure: [facility]ING;  Surgeon: Malka So, MD;  Location: [facility];  Service: Urology;  Laterality: N/A;  . Cystoscopy N/A 11/07/2012    Procedure: CYSTOSCOPY;  Surgeon: Malka So, MD;  Location: [facility];  Service: Urology;  Laterality: N/A;    Family History  Problem Relation Age of Onset  . Cancer Mother 69    thyroid, malignant brain tumor  . Arthritis Mother     neck, back  . Hepatitis Sister     Hepatitis C  . Heart disease Sister     stent  . Diabetes Maternal Grandmother   . Arthritis Father     back pain  . Heart disease Father  Allergies  Allergen Reactions  . Carbamazepine Hives  . Ceclor [Cefaclor]     Tongue swell    Current Outpatient Prescriptions on File Prior to Visit  Medication Sig Dispense Refill  . albuterol (PROVENTIL HFA;VENTOLIN HFA) 108 (90 BASE) MCG/ACT inhaler Inhale 2 puffs into the lungs every 6 (six) hours as needed for wheezing or shortness of breath. 1 Inhaler 2  . baclofen (LIORESAL) 20 MG tablet Take 1 tablet (20 mg total) by mouth at bedtime as needed for muscle spasms. 90 tablet 5  . BIOTIN PO Take by mouth.    . Calcium Carbonate-Vitamin D (CALTRATE 600+D) 600-400 MG-UNIT per tablet Take 1 tablet by mouth daily.     . chlorpheniramine-HYDROcodone (TUSSIONEX PENNKINETIC ER) 10-8 MG/5ML LQCR Take 5 mLs by  mouth every 12 (twelve) hours as needed. 115 mL 0  . Cholecalciferol (VITAMIN D3) 1000 UNITS CAPS Take by mouth.      . conjugated estrogens (PREMARIN) vaginal cream Place vaginally as directed. 3 times a week at bedtime    . diazepam (VALIUM) 10 MG tablet Take 1 tablet (10 mg total) by mouth daily as needed for anxiety. 40 tablet 1  . FLAXSEED, LINSEED, PO Take by mouth every other day.     . fluticasone (FLONASE) 50 MCG/ACT nasal spray Place 2 sprays into both nostrils daily. 16 g 6  . furosemide (LASIX) 40 MG tablet 1 tab po daily and a 2 nd tab prn increased edema 135 tablet 1  . hydrOXYzine (ATARAX/VISTARIL) 50 MG tablet Take 50 mg by mouth every 6 (six) hours as needed.    . lamoTRIgine (LAMICTAL) 200 MG tablet Take 200 mg by mouth daily.      . metolazone (ZAROXOLYN) 2.5 MG tablet TAKE 1 TABLET BY MOUTH DAILY AS NEEDED FOR EDEMA PRIOR TO LASIX DOSE 30 tablet 1  . Nutritional Supplements (ESTROVEN PO) Take by mouth.    . pantoprazole (PROTONIX) 40 MG tablet Take 1 tablet (40 mg total) by mouth daily. 90 tablet 1  . potassium chloride SA (K-DUR,KLOR-CON) 20 MEQ tablet 1 tab daily and a second tab daily as needed for edema, sob, 135 tablet 1  . potassium chloride SA (K-DUR,KLOR-CON) 20 MEQ tablet Take 2 by mouth daily for one week and recheck lab. 30 tablet 3  . promethazine (PHENERGAN) 25 MG tablet Take 1 tablet (25 mg total) by mouth every 8 (eight) hours as needed for nausea. 40 tablet 0  . ranitidine (ZANTAC) 300 MG tablet TAKE 1 TABLET (300 MG) BY MOUTH DAILY AT BEDTIME. 30 tablet 3  . rOPINIRole (REQUIP) 1 MG tablet Take 1 mg by mouth daily.      . sertraline (ZOLOFT) 50 MG tablet Take 50 mg by mouth daily.    . simvastatin (ZOCOR) 40 MG tablet Take 1 tablet (40 mg total) by mouth at bedtime. 90 tablet 1  . temazepam (RESTORIL) 15 MG capsule Take 1 capsule (15 mg total) by mouth at bedtime as needed for sleep. 30 capsule 1  . traMADol (ULTRAM) 50 MG tablet TAKE 2 TABLETS BY MOUTH EVERY  8 HOURS AS NEEDED FOR MODERATE TO SEVERE PAIN 120 tablet 0   No current facility-administered medications on file prior to visit.    BP 112/70 mmHg  Pulse 76  Temp(Src) 98.1 F (36.7 C) (Oral)  Ht 5\' 5"  (1.651 m)  Wt 191 lb 9.6 oz (86.909 kg)  BMI 31.88 kg/m2  SpO2 97%  LMP 10/03/2012      Objective:  Physical Exam  General  Mental Status - Alert. General Appearance - Well groomed. Not in acute distress.  Skin Rashes- No Rashes.  HEENT Head- Normal. Ear Auditory Canal - Left- Normal. Right - Normal.Tympanic Membrane- Left- Normal. Right- Normal. Eye Sclera/Conjunctiva- Left- Normal. Right- Normal. Nose & Sinuses Nasal Mucosa- Left-  Not boggy or Congested. Right-  Not  boggy or Congested. Mouth & Throat Lips: Upper Lip- Normal: no dryness, cracking, pallor, cyanosis, or vesicular eruption. Lower Lip-Normal: no dryness, cracking, pallor, cyanosis or vesicular eruption. Buccal Mucosa- Bilateral- No Aphthous ulcers. Cracked appearance midline of tonue. Mild enflamed appearance to tongue. Oropharynx- No Discharge or Erythema. Tonsils: Characteristics- Bilateral- moderate Erythema +Congestion. Size/Enlargement- Bilateral- + enlargement. Discharge- bilateral-None.  Neck Neck- Supple. No Masses. Mild shoddy submandibular nodes.    Chest and Lung Exam Auscultation: Breath Sounds:- even and unlabored,   Cardiovascular Auscultation:Rythm- Regular, rate and rhythm. Murmurs & Other Heart Sounds:Ausculatation of the heart reveal- No Murmurs.  Lymphatic Head & Neck General Head & Neck Lymphatics: Bilateral: Description- No Localized lymphadenopathy.       Assessment & Plan:

## 2014-09-24 ENCOUNTER — Ambulatory Visit
Admission: RE | Admit: 2014-09-24 | Discharge: 2014-09-24 | Disposition: A | Discharge status: Home / Self Care | Payer: BLUE CROSS/BLUE SHIELD | Source: Ambulatory Visit

## 2014-09-24 DIAGNOSIS — Z1231 Encounter for screening mammogram for malignant neoplasm of breast: Secondary | ICD-10-CM

## 2014-11-18 ENCOUNTER — Other Ambulatory Visit: Payer: Self-pay | Admitting: Family Medicine

## 2014-12-10 ENCOUNTER — Encounter: Payer: Self-pay | Admitting: Family Medicine

## 2014-12-10 ENCOUNTER — Ambulatory Visit (INDEPENDENT_AMBULATORY_CARE_PROVIDER_SITE_OTHER): Payer: BLUE CROSS/BLUE SHIELD | Admitting: Family Medicine

## 2014-12-10 VITALS — BP 110/64 | HR 94 | Temp 98.6°F | Ht 65.0 in | Wt 206.2 lb

## 2014-12-10 DIAGNOSIS — J209 Acute bronchitis, unspecified: Secondary | ICD-10-CM

## 2014-12-10 DIAGNOSIS — R52 Pain, unspecified: Secondary | ICD-10-CM | POA: Diagnosis not present

## 2014-12-10 DIAGNOSIS — E782 Mixed hyperlipidemia: Secondary | ICD-10-CM | POA: Diagnosis not present

## 2014-12-10 DIAGNOSIS — R110 Nausea: Secondary | ICD-10-CM

## 2014-12-10 DIAGNOSIS — G4700 Insomnia, unspecified: Secondary | ICD-10-CM | POA: Diagnosis not present

## 2014-12-10 DIAGNOSIS — K219 Gastro-esophageal reflux disease without esophagitis: Secondary | ICD-10-CM | POA: Diagnosis not present

## 2014-12-10 DIAGNOSIS — E669 Obesity, unspecified: Secondary | ICD-10-CM | POA: Diagnosis not present

## 2014-12-10 DIAGNOSIS — M5440 Lumbago with sciatica, unspecified side: Secondary | ICD-10-CM

## 2014-12-10 DIAGNOSIS — K5900 Constipation, unspecified: Secondary | ICD-10-CM

## 2014-12-10 MED ORDER — METOLAZONE 2.5 MG PO TABS: 2.5000 mg | ORAL_TABLET | Freq: Two times a day (BID) | ORAL | Status: DC | PRN

## 2014-12-10 MED ORDER — PANTOPRAZOLE SODIUM 40 MG PO TBEC
40.0000 mg | DELAYED_RELEASE_TABLET | Freq: Every day | ORAL | Status: DC
Start: 2014-12-10 — End: 2014-12-10

## 2014-12-10 MED ORDER — DIAZEPAM 10 MG PO TABS
10.0000 mg | ORAL_TABLET | Freq: Every day | ORAL | Status: DC | PRN
Start: 2014-12-10 — End: 2015-08-14

## 2014-12-10 MED ORDER — PROMETHAZINE HCL 25 MG PO TABS: 25.0000 mg | ORAL_TABLET | Freq: Three times a day (TID) | ORAL | Status: DC | PRN

## 2014-12-10 MED ORDER — PANTOPRAZOLE SODIUM 40 MG PO TBEC: 40.0000 mg | DELAYED_RELEASE_TABLET | Freq: Every day | ORAL | Status: DC

## 2014-12-10 MED ORDER — HYDROCODONE-ACETAMINOPHEN 7.5-325 MG PO TABS: 1.0000 | ORAL_TABLET | Freq: Four times a day (QID) | ORAL | Status: DC | PRN

## 2014-12-10 MED ORDER — TEMAZEPAM 15 MG PO CAPS: 15.0000 mg | ORAL_CAPSULE | Freq: Every evening | ORAL | Status: DC | PRN

## 2014-12-10 NOTE — Progress Notes (Signed)
Pre visit review using our clinic review tool, if applicable. No additional management support is needed unless otherwise documented below in the visit note. 

## 2014-12-10 NOTE — Progress Notes (Signed)
[name]  [id number] [birth year] 12/10/2014      Progress Note-Follow Up  Subjective  Chief Complaint  Chief Complaint  Patient presents with  . Back Pain    HPI  Patient is a 59 y.o. female in today for routine medical care. Patient is in today for follow-up. Continues to struggle with back pain. Has used numerous people to manage her back pain in the past. Is following with Dr. Hal Neer, Dr. Andree Elk and Dr. Vira Blanco historically. Has had some recent steroid-induced injections. Continues to have trouble with thoracic and low back pain as well as radiculopathy. No recent falls or acute injury. Pain medications help intermittently. Has had a flare and some nausea and diarrhea these of improved. Has occasional dyspepsia and constipation but no bloody or tarry stool. Is requesting [place] to refill her temazepam for sleep which is helpful. Denies CP/palp/SOB/HA/congestion/fevers or GU c/o. Taking meds as prescribed  Past Medical History  Diagnosis Date  . Allergy     seasonal  . Anxiety   . Depression   . Hyperlipidemia   . History of proteinuria syndrome   . Edema extremities   . RLS (restless legs syndrome)   . Dyspareunia   . Obese   . Onychomycosis 05/09/2011  . Low back pain 05/09/2011  . RUQ pain 09/20/2011  . Hip pain, left 11/01/2011  . Lymphadenitis 06/16/2012  . Otitis externa 06/23/2012    left  . GERD (gastroesophageal reflux disease)   . Insomnia 03/11/2013  . Perimenopause 03/11/2013  . Acute sinusitis 03/27/2013  . Unspecified constipation 05/13/2013  . Arthritis 06/09/2013  . Urinary frequency 08/26/2013  . Oral lesion 08/26/2013  . Encounter for preventative adult health care exam with abnormal findings 04/21/2014  . Sleep apnea 04/21/2014    Past Surgical History  Procedure Laterality Date  . Cesarean section  1993  . Back surgery  2006    low, discectomy for herniated disc  . Tubal ligation  1993  . Carpal tunnel release Right 2006  . Bladder suspension  N/A 11/07/2012    Procedure: [facility];  Surgeon: Malka So, MD;  Location: [facility];  Service: Urology;  Laterality: N/A;  . Cystoscopy N/A 11/07/2012    Procedure: CYSTOSCOPY;  Surgeon: Malka So, MD;  Location: [facility];  Service: Urology;  Laterality: N/A;    Family History  Problem Relation Age of Onset  . Cancer Mother 57    thyroid, malignant brain tumor  . Arthritis Mother     neck, back  . Hepatitis Sister     Hepatitis C  . Heart disease Sister     stent  . Diabetes Maternal Grandmother   . Arthritis Father     back pain  . Heart disease Father     History   Social History  . Marital Status: Married    Spouse Name: N/A  . Number of Children: N/A  . Years of Education: N/A   Occupational History  . Not on file.   Social History Main Topics  . Smoking status: Former Smoker -- 0.50 packs/day for 20 years    Types: Cigarettes    Quit date: 07/12/1992  . Smokeless tobacco: Never Used  . Alcohol Use: Yes     Comment: occasionaly  . Drug Use: No  . Sexual Activity:    Partners: Male   Other Topics Concern  . Not on file   Social History Narrative    Current Outpatient  Prescriptions on File Prior to Visit  Medication Sig Dispense Refill  . BIOTIN PO Take by mouth.    . Calcium Carbonate-Vitamin D (CALTRATE 600+D) 600-400 MG-UNIT per tablet Take 1 tablet by mouth daily.     . Cholecalciferol (VITAMIN D3) 1000 UNITS CAPS Take by mouth.      . conjugated estrogens (PREMARIN) vaginal cream Place vaginally as directed. 3 times a week at bedtime    . diazepam (VALIUM) 10 MG tablet Take 1 tablet (10 mg total) by mouth daily as needed for anxiety. 40 tablet 1  . FLAXSEED, LINSEED, PO Take by mouth every other day.     . fluticasone (FLONASE) 50 MCG/ACT nasal spray Place 2 sprays into both nostrils daily. 16 g 6  . furosemide (LASIX) 40 MG tablet TAKE 1 TABLET BY MOUTH DAILY AND A SECOND TABLET AS NEEDED FOR EDEMA 135  tablet 1  . Gabapentin Enacarbil 600 MG TBCR Take by mouth 2 (two) times daily.    Marland Kitchen HYDROcodone-acetaminophen (NORCO) 7.5-325 MG per tablet Take 1 tablet by mouth every 6 (six) hours as needed for moderate pain. One tablet daily 4 times a day as needed for pain.    . hydrOXYzine (ATARAX/VISTARIL) 50 MG tablet Take 50 mg by mouth every 6 (six) hours as needed.    . lamoTRIgine (LAMICTAL) 200 MG tablet Take 200 mg by mouth daily.      . metolazone (ZAROXOLYN) 2.5 MG tablet TAKE 1 TABLET BY MOUTH DAILY AS NEEDED FOR EDEMA PRIOR TO LASIX DOSE 30 tablet 1  . Nutritional Supplements (ESTROVEN PO) Take by mouth.    . pantoprazole (PROTONIX) 40 MG tablet Take 1 tablet (40 mg total) by mouth daily. 90 tablet 1  . promethazine (PHENERGAN) 25 MG tablet Take 1 tablet (25 mg total) by mouth every 8 (eight) hours as needed for nausea. 40 tablet 0  . ranitidine (ZANTAC) 300 MG tablet TAKE 1 TABLET (300 MG) BY MOUTH DAILY AT BEDTIME. 30 tablet 3  . rOPINIRole (REQUIP) 1 MG tablet Take 1 mg by mouth daily.      . sertraline (ZOLOFT) 50 MG tablet Take 50 mg by mouth daily.    . simvastatin (ZOCOR) 40 MG tablet Take 1 tablet (40 mg total) by mouth at bedtime. 90 tablet 1  . simvastatin (ZOCOR) 40 MG tablet TAKE 1 TABLET (40 MG TOTAL) BY MOUTH AT BEDTIME. 90 tablet 0  . temazepam (RESTORIL) 15 MG capsule Take 1 capsule (15 mg total) by mouth at bedtime as needed for sleep. 30 capsule 1  . albuterol (PROVENTIL HFA;VENTOLIN HFA) 108 (90 BASE) MCG/ACT inhaler Inhale 2 puffs into the lungs every 6 (six) hours as needed for wheezing or shortness of breath. (Patient not taking: Reported on 12/10/2014) 1 Inhaler 2  . Alum & Mag Hydroxide-Simeth (MAGIC MOUTHWASH) SOLN 5 ml po qid swish and spit (Patient not taking: Reported on 12/10/2014) 200 mL 0  . baclofen (LIORESAL) 20 MG tablet Take 1 tablet (20 mg total) by mouth at bedtime as needed for muscle spasms. (Patient not taking: Reported on 12/10/2014) 90 tablet 5  . potassium  chloride SA (K-DUR,KLOR-CON) 20 MEQ tablet 1 tab daily and a second tab daily as needed for edema, sob, 135 tablet 1  . potassium chloride SA (K-DUR,KLOR-CON) 20 MEQ tablet Take 2 by mouth daily for one week and recheck lab. 30 tablet 3  . traMADol (ULTRAM) 50 MG tablet TAKE 2 TABLETS BY MOUTH EVERY 8 HOURS AS NEEDED FOR MODERATE  TO SEVERE PAIN (Patient not taking: Reported on 12/10/2014) 120 tablet 0   No current facility-administered medications on file prior to visit.    Allergies  Allergen Reactions  . Carbamazepine Hives  . Ceclor [Cefaclor]     Tongue swell    Review of Systems  Review of Systems  Constitutional: Negative for fever and malaise/fatigue.  HENT: Negative for congestion.   Eyes: Negative for discharge.  Respiratory: Negative for shortness of breath.   Cardiovascular: Negative for chest pain, palpitations and leg swelling.  Gastrointestinal: Positive for constipation. Negative for nausea, abdominal pain, diarrhea, blood in stool and melena.  Genitourinary: Negative for dysuria.  Musculoskeletal: Positive for back pain. Negative for falls.  Skin: Negative for rash.  Neurological: Negative for loss of consciousness and headaches.  Endo/Heme/Allergies: Negative for polydipsia.  Psychiatric/Behavioral: Positive for depression. Negative for suicidal ideas. The patient is nervous/anxious and has insomnia.     Objective  BP 110/64 mmHg  Pulse 94  Temp(Src) 98.6 F (37 C) (Oral)  Ht 5\' 5"  (1.651 m)  Wt 206 lb 4 oz (93.554 kg)  BMI 34.32 kg/m2  SpO2 93%  LMP 10/03/2012  Physical Exam  Physical Exam  Constitutional: She is oriented to person, place, and time and well-developed, well-nourished, and in no distress. No distress.  HENT:  Head: Normocephalic and atraumatic.  Eyes: Conjunctivae are normal.  Neck: Neck supple. No thyromegaly present.  Cardiovascular: Normal rate, regular rhythm and normal heart sounds.   No murmur heard. Pulmonary/Chest: Effort  normal and breath sounds normal. She has no wheezes.  Abdominal: She exhibits no distension and no mass.  Musculoskeletal: She exhibits no edema.  Lymphadenopathy:    She has no cervical adenopathy.  Neurological: She is alert and oriented to person, place, and time.  Skin: Skin is warm and dry. No rash noted. She is not diaphoretic.  Psychiatric: Memory, affect and judgment normal.    Lab Results  Component Value Date   TSH 0.76 04/18/2014   Lab Results  Component Value Date   WBC 8.1 04/18/2014   HGB 15.4* 04/18/2014   HCT 47.3* 04/18/2014   MCV 86.1 04/18/2014   PLT 321.0 04/18/2014   Lab Results  Component Value Date   CREATININE 0.86 08/28/2014   BUN 29* 08/28/2014   NA 138 08/28/2014   K 4.5 08/28/2014   CL 107 08/28/2014   CO2 27 08/28/2014   Lab Results  Component Value Date   ALT 17 08/28/2014   AST 17 08/28/2014   ALKPHOS 64 08/28/2014   BILITOT 0.6 08/28/2014   Lab Results  Component Value Date   CHOL 198 04/18/2014   Lab Results  Component Value Date   HDL 44.60 04/18/2014   Lab Results  Component Value Date   LDLCALC 117* 04/18/2014   Lab Results  Component Value Date   TRIG 181.0* 04/18/2014   Lab Results  Component Value Date   CHOLHDL 4 04/18/2014     Assessment & Plan   GERD (gastroesophageal reflux disease) Avoid offending foods, take probiotics. Do not eat large meals in late evening and consider raising head of bed.   Obese Encouraged DASH diet, decrease po intake and increase exercise as tolerated. Needs 7-8 hours of sleep nightly. Avoid trans fats, eat small, frequent meals every 4-5 hours with lean proteins, complex carbs and healthy fats. Minimize simple carbs,   Pain Chronic pain especially in lower back. Was given a prescription for Embeda from pain management but declined use. Has  been using Oxycodone.sparingly. Has had worsening pain and depression. Has stopped seeing the pain management doctor and has been using low  doses of Oxycdone  Constipation Encouraged increased hydration and fiber in diet. Daily probiotics. If bowels not moving can use MOM 2 tbls po in 4 oz of warm prune juice by mouth every 2-3 days. If no results then repeat in 4 hours with  Dulcolax suppository pr, may repeat again in 4 more hours as needed. Seek care if symptoms worsen. Consider daily Miralax and/or Dulcolax if symptoms persist.

## 2014-12-10 NOTE — Assessment & Plan Note (Signed)
Encouraged DASH diet, decrease po intake and increase exercise as tolerated. Needs 7-8 hours of sleep nightly. Avoid trans fats, eat small, frequent meals every 4-5 hours with lean proteins, complex carbs and healthy fats. Minimize simple carbs, 

## 2014-12-10 NOTE — Assessment & Plan Note (Signed)
Chronic pain especially in lower back. Was given a prescription for Embeda from pain management but declined use. Has been using Oxycodone.sparingly. Has had worsening pain and depression. Has stopped seeing the pain management doctor and has been using low doses of Oxycdone

## 2014-12-10 NOTE — Assessment & Plan Note (Signed)
Avoid offending foods, take probiotics. Do not eat large meals in late evening and consider raising head of bed.  

## 2014-12-10 NOTE — Patient Instructions (Signed)

## 2014-12-11 LAB — LIPID PANEL
CHOLESTEROL: 234 mg/dL — AB (ref 0–200)
HDL: 48.9 mg/dL (ref >39.00)
NonHDL: 185.1
Total CHOL/HDL Ratio: 5
Triglycerides: 398 mg/dL — ABNORMAL HIGH (ref 0.0–149.0)
VLDL: 79.6 mg/dL — ABNORMAL HIGH (ref 0.0–40.0)

## 2014-12-11 LAB — COMPREHENSIVE METABOLIC PANEL
ALK PHOS: 74 U/L (ref 39–117)
ALT: 19 U/L (ref 0–35)
AST: 16 U/L (ref 0–37)
Albumin: 4.5 g/dL (ref 3.5–5.2)
BUN: 16 mg/dL (ref 6–23)
CALCIUM: 9.6 mg/dL (ref 8.4–10.5)
CO2: 32 meq/L (ref 19–32)
Chloride: 101 mEq/L (ref 96–112)
Creatinine, Ser: 1.25 mg/dL — ABNORMAL HIGH (ref 0.40–1.20)
GFR: 46.96 mL/min — AB (ref >60.00)
GLUCOSE: 86 mg/dL (ref 70–99)
Potassium: 3.8 mEq/L (ref 3.5–5.1)
SODIUM: 140 meq/L (ref 135–145)
Total Bilirubin: 0.4 mg/dL (ref 0.2–1.2)
Total Protein: 7.5 g/dL (ref 6.0–8.3)

## 2014-12-11 LAB — CBC
HCT: 43.2 % (ref 36.0–46.0)
Hemoglobin: 14.4 g/dL (ref 12.0–15.0)
MCHC: 33.5 g/dL (ref 30.0–36.0)
MCV: 87.1 fl (ref 78.0–100.0)
Platelets: 298 10*3/uL (ref 150.0–400.0)
RBC: 4.96 Mil/uL (ref 3.87–5.11)
RDW: 13.2 % (ref 11.5–15.5)
WBC: 8.5 10*3/uL (ref 4.0–10.5)

## 2014-12-11 LAB — TSH: TSH: 1.85 u[IU]/mL (ref 0.35–4.50)

## 2014-12-11 LAB — LDL CHOLESTEROL, DIRECT: LDL DIRECT: 122 mg/dL

## 2014-12-12 ENCOUNTER — Other Ambulatory Visit: Payer: Self-pay | Admitting: Family Medicine

## 2014-12-12 MED ORDER — ATORVASTATIN CALCIUM 40 MG PO TABS: 40.0000 mg | ORAL_TABLET | Freq: Every day | ORAL | Status: DC

## 2014-12-22 NOTE — Assessment & Plan Note (Signed)
Encouraged increased hydration and fiber in diet. Daily probiotics. If bowels not moving can use MOM 2 tbls po in 4 oz of warm prune juice by mouth every 2-3 days. If no results then repeat in 4 hours with  Dulcolax suppository pr, may repeat again in 4 more hours as needed. Seek care if symptoms worsen. Consider daily Miralax and/or Dulcolax if symptoms persist.  

## 2014-12-30 ENCOUNTER — Other Ambulatory Visit: Payer: Self-pay | Admitting: Family Medicine

## 2015-01-16 ENCOUNTER — Other Ambulatory Visit: Payer: Self-pay | Admitting: Family Medicine

## 2015-01-16 MED ORDER — HYDROCODONE-ACETAMINOPHEN 7.5-325 MG PO TABS: 1.0000 | ORAL_TABLET | Freq: Four times a day (QID) | ORAL | Status: DC | PRN

## 2015-01-16 NOTE — Telephone Encounter (Signed)
Requesting: Hydrocodone Contract  Signed on 04/19/14 UDS  Done 04/19/14 HIGH RISK Last OV   08/15/14 Last Refill   #30 on 12/19/14  Please Advise

## 2015-01-16 NOTE — Telephone Encounter (Signed)
Caller name: Rotunda Worden Relationship to patient: self Can be reached: [phone]  Reason for call: Pt called in for hydrocodone 7.5mg . She has 4 left. Takes 2-3/day. Advised her to give 3-5 day notice in future. Please call pt when RX ready for pick up.

## 2015-01-17 MED ORDER — HYDROCODONE-ACETAMINOPHEN 7.5-325 MG PO TABS: 1.0000 | ORAL_TABLET | Freq: Four times a day (QID) | ORAL | Status: DC | PRN

## 2015-01-17 NOTE — Addendum Note (Signed)
Addended by: Sharon Seller B on: 01/17/2015 07:21 AM   Modules accepted: Orders

## 2015-01-17 NOTE — Telephone Encounter (Signed)
Called informed the patient to pickup hardcopy for hydrocodone at the front desk.

## 2015-01-17 NOTE — Telephone Encounter (Signed)
Printed and on counter for signature. 

## 2015-01-22 ENCOUNTER — Ambulatory Visit (INDEPENDENT_AMBULATORY_CARE_PROVIDER_SITE_OTHER): Payer: BLUE CROSS/BLUE SHIELD | Admitting: Medical

## 2015-01-22 ENCOUNTER — Emergency Department (HOSPITAL_BASED_OUTPATIENT_CLINIC_OR_DEPARTMENT_OTHER)
Admission: EM | Admit: 2015-01-22 | Discharge: 2015-01-22 | Disposition: A | Discharge status: Home / Self Care | Payer: BLUE CROSS/BLUE SHIELD | Attending: Emergency Medicine | Admitting: Emergency Medicine

## 2015-01-22 ENCOUNTER — Encounter (HOSPITAL_BASED_OUTPATIENT_CLINIC_OR_DEPARTMENT_OTHER): Payer: Self-pay | Admitting: *Deleted

## 2015-01-22 ENCOUNTER — Encounter: Payer: Self-pay | Admitting: Medical

## 2015-01-22 ENCOUNTER — Emergency Department (HOSPITAL_BASED_OUTPATIENT_CLINIC_OR_DEPARTMENT_OTHER): Payer: BLUE CROSS/BLUE SHIELD

## 2015-01-22 VITALS — BP 122/72 | HR 84 | Temp 98.2°F | Ht 65.0 in | Wt 205.0 lb

## 2015-01-22 DIAGNOSIS — Z79899 Other long term (current) drug therapy: Secondary | ICD-10-CM | POA: Diagnosis not present

## 2015-01-22 DIAGNOSIS — F329 Major depressive disorder, single episode, unspecified: Secondary | ICD-10-CM | POA: Diagnosis not present

## 2015-01-22 DIAGNOSIS — M79652 Pain in left thigh: Secondary | ICD-10-CM | POA: Diagnosis not present

## 2015-01-22 DIAGNOSIS — Z87891 Personal history of nicotine dependence: Secondary | ICD-10-CM | POA: Diagnosis not present

## 2015-01-22 DIAGNOSIS — R0602 Shortness of breath: Secondary | ICD-10-CM | POA: Diagnosis present

## 2015-01-22 DIAGNOSIS — J208 Acute bronchitis due to other specified organisms: Secondary | ICD-10-CM | POA: Diagnosis not present

## 2015-01-22 DIAGNOSIS — Z8679 Personal history of other diseases of the circulatory system: Secondary | ICD-10-CM | POA: Insufficient documentation

## 2015-01-22 DIAGNOSIS — Z87448 Personal history of other diseases of urinary system: Secondary | ICD-10-CM | POA: Diagnosis not present

## 2015-01-22 DIAGNOSIS — G4700 Insomnia, unspecified: Secondary | ICD-10-CM | POA: Insufficient documentation

## 2015-01-22 DIAGNOSIS — K219 Gastro-esophageal reflux disease without esophagitis: Secondary | ICD-10-CM | POA: Insufficient documentation

## 2015-01-22 DIAGNOSIS — R110 Nausea: Secondary | ICD-10-CM | POA: Diagnosis not present

## 2015-01-22 DIAGNOSIS — R079 Chest pain, unspecified: Secondary | ICD-10-CM | POA: Insufficient documentation

## 2015-01-22 DIAGNOSIS — F419 Anxiety disorder, unspecified: Secondary | ICD-10-CM | POA: Diagnosis not present

## 2015-01-22 DIAGNOSIS — R011 Cardiac murmur, unspecified: Secondary | ICD-10-CM | POA: Insufficient documentation

## 2015-01-22 DIAGNOSIS — M25512 Pain in left shoulder: Secondary | ICD-10-CM | POA: Diagnosis not present

## 2015-01-22 DIAGNOSIS — Z8709 Personal history of other diseases of the respiratory system: Secondary | ICD-10-CM | POA: Diagnosis not present

## 2015-01-22 DIAGNOSIS — R0789 Other chest pain: Secondary | ICD-10-CM

## 2015-01-22 DIAGNOSIS — Z8742 Personal history of other diseases of the female genital tract: Secondary | ICD-10-CM | POA: Diagnosis not present

## 2015-01-22 DIAGNOSIS — E785 Hyperlipidemia, unspecified: Secondary | ICD-10-CM | POA: Insufficient documentation

## 2015-01-22 DIAGNOSIS — M7989 Other specified soft tissue disorders: Secondary | ICD-10-CM | POA: Insufficient documentation

## 2015-01-22 DIAGNOSIS — E876 Hypokalemia: Secondary | ICD-10-CM | POA: Diagnosis not present

## 2015-01-22 DIAGNOSIS — M79606 Pain in leg, unspecified: Secondary | ICD-10-CM

## 2015-01-22 DIAGNOSIS — J0100 Acute maxillary sinusitis, unspecified: Secondary | ICD-10-CM

## 2015-01-22 DIAGNOSIS — M1990 Unspecified osteoarthritis, unspecified site: Secondary | ICD-10-CM | POA: Insufficient documentation

## 2015-01-22 DIAGNOSIS — M25519 Pain in unspecified shoulder: Secondary | ICD-10-CM | POA: Insufficient documentation

## 2015-01-22 DIAGNOSIS — Z8619 Personal history of other infectious and parasitic diseases: Secondary | ICD-10-CM | POA: Diagnosis not present

## 2015-01-22 DIAGNOSIS — E669 Obesity, unspecified: Secondary | ICD-10-CM | POA: Insufficient documentation

## 2015-01-22 LAB — COMPREHENSIVE METABOLIC PANEL
ALT: 28 U/L (ref 14–54)
ANION GAP: 13 (ref 5–15)
AST: 26 U/L (ref 15–41)
Albumin: 5.1 g/dL — ABNORMAL HIGH (ref 3.5–5.0)
Alkaline Phosphatase: 84 U/L (ref 38–126)
BUN: 13 mg/dL (ref 6–20)
CHLORIDE: 96 mmol/L — AB (ref 101–111)
CO2: 32 mmol/L (ref 22–32)
CREATININE: 0.94 mg/dL (ref 0.44–1.00)
Calcium: 10.1 mg/dL (ref 8.9–10.3)
GFR calc Af Amer: >60 mL/min (ref >60)
GFR calc non Af Amer: >60 mL/min (ref >60)
Glucose, Bld: 102 mg/dL — ABNORMAL HIGH (ref 65–99)
Potassium: 2.8 mmol/L — ABNORMAL LOW (ref 3.5–5.1)
Sodium: 141 mmol/L (ref 135–145)
Total Bilirubin: 1 mg/dL (ref 0.3–1.2)
Total Protein: 8.5 g/dL — ABNORMAL HIGH (ref 6.5–8.1)

## 2015-01-22 LAB — CBC WITH DIFFERENTIAL/PLATELET
Basophils Absolute: 0 10*3/uL (ref 0.0–0.1)
Basophils Relative: 0 % (ref 0–1)
Eosinophils Absolute: 0.2 10*3/uL (ref 0.0–0.7)
Eosinophils Relative: 2 % (ref 0–5)
HCT: 46.1 % — ABNORMAL HIGH (ref 36.0–46.0)
HEMOGLOBIN: 15.4 g/dL — AB (ref 12.0–15.0)
LYMPHS ABS: 3.3 10*3/uL (ref 0.7–4.0)
Lymphocytes Relative: 33 % (ref 12–46)
MCH: 28.4 pg (ref 26.0–34.0)
MCHC: 33.4 g/dL (ref 30.0–36.0)
MCV: 85.1 fL (ref 78.0–100.0)
Monocytes Absolute: 0.7 10*3/uL (ref 0.1–1.0)
Monocytes Relative: 7 % (ref 3–12)
NEUTROS PCT: 58 % (ref 43–77)
Neutro Abs: 5.7 10*3/uL (ref 1.7–7.7)
Platelets: 337 10*3/uL (ref 150–400)
RBC: 5.42 MIL/uL — ABNORMAL HIGH (ref 3.87–5.11)
RDW: 12.7 % (ref 11.5–15.5)
WBC: 10 10*3/uL (ref 4.0–10.5)

## 2015-01-22 LAB — BRAIN NATRIURETIC PEPTIDE: B Natriuretic Peptide: 18 pg/mL (ref 0.0–100.0)

## 2015-01-22 LAB — TROPONIN I: Troponin I: <0.03 ng/mL (ref <0.031)

## 2015-01-22 MED ORDER — ALBUTEROL SULFATE HFA 108 (90 BASE) MCG/ACT IN AERS: 2.0000 | INHALATION_SPRAY | Freq: Four times a day (QID) | RESPIRATORY_TRACT | Status: DC | PRN

## 2015-01-22 MED ORDER — MORPHINE SULFATE 4 MG/ML IJ SOLN
4.0000 mg | Freq: Once | INTRAMUSCULAR | Status: AC
  Administered 2015-01-22: 4 mg via INTRAVENOUS
  Filled 2015-01-22: qty 1

## 2015-01-22 MED ORDER — POTASSIUM CHLORIDE CRYS ER 20 MEQ PO TBCR
40.0000 meq | EXTENDED_RELEASE_TABLET | Freq: Once | ORAL | Status: AC
  Administered 2015-01-22: 40 meq via ORAL
  Filled 2015-01-22: qty 2

## 2015-01-22 MED ORDER — POTASSIUM CHLORIDE CRYS ER 20 MEQ PO TBCR: 20.0000 meq | EXTENDED_RELEASE_TABLET | Freq: Every day | ORAL | Status: DC

## 2015-01-22 MED ORDER — HYDROCODONE-ACETAMINOPHEN 5-325 MG PO TABS
2.0000 | ORAL_TABLET | Freq: Once | ORAL | Status: AC
  Administered 2015-01-22: 2 via ORAL
  Filled 2015-01-22: qty 2

## 2015-01-22 MED ORDER — AZITHROMYCIN 250 MG PO TABS: ORAL_TABLET | ORAL | Status: DC

## 2015-01-22 MED ORDER — POTASSIUM CHLORIDE 10 MEQ/100ML IV SOLN
10.0000 meq | Freq: Once | INTRAVENOUS | Status: AC
  Administered 2015-01-22: 10 meq via INTRAVENOUS
  Filled 2015-01-22: qty 100

## 2015-01-22 MED ORDER — FLUTICASONE PROPIONATE 50 MCG/ACT NA SUSP: 2.0000 | Freq: Every day | NASAL | Status: DC

## 2015-01-22 NOTE — ED Notes (Signed)
Called the patients husband, Cheral Bay, at [phone].

## 2015-01-22 NOTE — Patient Instructions (Addendum)
Sinusitis with possible bronchitis.  Rx azithromycin, and flonase inhaler.   Get labs and cxr.  Pain in joint, shoulder region ekg and troponin today. Pt has no shoulder pain today. No chest pain or dyspnea. No homans sign. But since some yesterday and recurrent past 2 months will get ekg, cxr adn troponin today. Refer back to cardiology stat.  During interim recurrent symptoms then ED eval.    Follow up in 7 days or as needed  After comparing old ekg to this one and in light of recent symptoms decided to send pt to ED for possible stat troponin studies.

## 2015-01-22 NOTE — ED Notes (Signed)
Pt sts no change in pain. MD made aware and new orders received.

## 2015-01-22 NOTE — ED Notes (Signed)
Pt. returned from XR. 

## 2015-01-22 NOTE — ED Notes (Signed)
Patient transported to X-ray ambulatory with tech. 

## 2015-01-22 NOTE — ED Notes (Signed)
MD at bedside. 

## 2015-01-22 NOTE — ED Notes (Signed)
Pt c/o left shoulder pain x 3 months with SOB sent here from PMD office for eval

## 2015-01-22 NOTE — ED Notes (Signed)
Pt reports cough x 2 months. Reports pain in left shoulder that has radiated down left arm. Also reports dry cough and "trouble with my sinuses." Pt sts she has pain in left groin as well.

## 2015-01-22 NOTE — ED Notes (Signed)
Pt returned from US

## 2015-01-22 NOTE — ED Provider Notes (Signed)
CSN: [id number]     Arrival date & time 01/22/15  1417 History   First MD Initiated Contact with Patient 01/22/15 1459     Chief Complaint  Patient presents with  . Shoulder Pain     (Consider location/radiation/quality/duration/timing/severity/associated sxs/prior Treatment) The history is provided by the patient.  [name] is a 59 y.o. female hx of anxiety, depression, sleep apnea, aortic insufficiency who presented with shortness of breath, shoulder pain. Intermittent shortness of breath for the last 2 months. States that shortness of breath is worse when she exerts herself. Denies any pleuritic chest pain. She states that she has been trying to take care of her mother, who recently passed away. She went to see her doctor today and sent here for evaluation. She notes chronic left leg swelling and last ultrasound done in 2013 showed no DVT. She saw cardiology year ago and had a normal stress test but had an echo that showed mild aortic regurgitation. Denies any fevers or chills but does have some sinus congestion.    Past Medical History  Diagnosis Date  . Allergy     seasonal  . Anxiety   . Depression   . Hyperlipidemia   . History of proteinuria syndrome   . Edema extremities   . RLS (restless legs syndrome)   . Dyspareunia   . Obese   . Onychomycosis 05/09/2011  . Low back pain 05/09/2011  . RUQ pain 09/20/2011  . Hip pain, left 11/01/2011  . Lymphadenitis 06/16/2012  . Otitis externa 06/23/2012    left  . GERD (gastroesophageal reflux disease)   . Insomnia 03/11/2013  . Perimenopause 03/11/2013  . Acute sinusitis 03/27/2013  . Unspecified constipation 05/13/2013  . Arthritis 06/09/2013  . Urinary frequency 08/26/2013  . Oral lesion 08/26/2013  . Encounter for preventative adult health care exam with abnormal findings 04/21/2014  . Sleep apnea 04/21/2014   Past Surgical History  Procedure Laterality Date  . Cesarean section  1993  . Back surgery  2006    low,  discectomy for herniated disc  . Tubal ligation  1993  . Carpal tunnel release Right 2006  . Bladder suspension N/A 11/07/2012    Procedure: [facility];  Surgeon: Malka So, MD;  Location: [facility];  Service: Urology;  Laterality: N/A;  . Cystoscopy N/A 11/07/2012    Procedure: CYSTOSCOPY;  Surgeon: Malka So, MD;  Location: [facility];  Service: Urology;  Laterality: N/A;   Family History  Problem Relation Age of Onset  . Cancer Mother 49    thyroid, malignant brain tumor  . Arthritis Mother     neck, back  . Hepatitis Sister     Hepatitis C  . Heart disease Sister     stent  . Diabetes Maternal Grandmother   . Arthritis Father     back pain  . Heart disease Father    History  Substance Use Topics  . Smoking status: Former Smoker -- 0.50 packs/day for 20 years    Types: Cigarettes    Quit date: 07/12/1992  . Smokeless tobacco: Never Used  . Alcohol Use: Yes     Comment: occasionaly   OB History    No data available     Review of Systems  Respiratory: Positive for shortness of breath.   Cardiovascular: Positive for chest pain.  All other systems reviewed and are negative.     Allergies  Carbamazepine and Ceclor  Home Medications  Prior to Admission medications   Medication Sig Start Date End Date Taking? Authorizing Provider  albuterol (PROVENTIL HFA;VENTOLIN HFA) 108 (90 BASE) MCG/ACT inhaler Inhale 2 puffs into the lungs every 6 (six) hours as needed for wheezing or shortness of breath. 01/22/15   Percell Miller Saguier, PA-C  atorvastatin (LIPITOR) 40 MG tablet Take 1 tablet (40 mg total) by mouth at bedtime. 12/12/14   Mosie Lukes, MD  azithromycin (ZITHROMAX) 250 MG tablet Take 2 tablets by mouth on day 1, followed by 1 tablet by mouth daily for 4 days. 01/22/15   Percell Miller Saguier, PA-C  BIOTIN PO Take by mouth.    Historical Provider, MD  buPROPion (WELLBUTRIN SR) 150 MG 12 hr tablet Take 150 mg by mouth daily.    Historical  Provider, MD  Calcium Carbonate-Vitamin D (CALTRATE 600+D) 600-400 MG-UNIT per tablet Take 1 tablet by mouth daily.     Historical Provider, MD  Cholecalciferol (VITAMIN D3) 1000 UNITS CAPS Take by mouth.      Historical Provider, MD  conjugated estrogens (PREMARIN) vaginal cream Place vaginally as directed. 3 times a week at bedtime    Historical Provider, MD  diazepam (VALIUM) 10 MG tablet Take 1 tablet (10 mg total) by mouth daily as needed for anxiety. 12/10/14   Mosie Lukes, MD  FLAXSEED, LINSEED, PO Take by mouth every other day.     Historical Provider, MD  fluticasone (FLONASE) 50 MCG/ACT nasal spray Place 2 sprays into both nostrils daily. 01/22/15   Percell Miller Saguier, PA-C  furosemide (LASIX) 40 MG tablet TAKE 1 TABLET BY MOUTH DAILY AND A SECOND TABLET AS NEEDED FOR EDEMA 11/18/14   Mosie Lukes, MD  Gabapentin Enacarbil 600 MG TBCR Take by mouth 2 (two) times daily.    Historical Provider, MD  HYDROcodone-acetaminophen (NORCO) 7.5-325 MG per tablet Take 1 tablet by mouth every 6 (six) hours as needed for moderate pain. 01/17/15   Mosie Lukes, MD  hydrOXYzine (ATARAX/VISTARIL) 50 MG tablet Take 50 mg by mouth every 6 (six) hours as needed.    Historical Provider, MD  lamoTRIgine (LAMICTAL) 200 MG tablet Take 200 mg by mouth daily.      Historical Provider, MD  metolazone (ZAROXOLYN) 2.5 MG tablet Take 1 tablet (2.5 mg total) by mouth 2 (two) times daily as needed. 12/10/14   Mosie Lukes, MD  Nutritional Supplements (ESTROVEN PO) Take by mouth.    Historical Provider, MD  pantoprazole (PROTONIX) 40 MG tablet Take 1 tablet (40 mg total) by mouth daily. 12/10/14   Mosie Lukes, MD  potassium chloride SA (K-DUR,KLOR-CON) 20 MEQ tablet 1 tab daily and a second tab daily as needed for edema, sob, 08/23/13   Mosie Lukes, MD  promethazine (PHENERGAN) 25 MG tablet Take 1 tablet (25 mg total) by mouth every 8 (eight) hours as needed for nausea. 12/10/14   Mosie Lukes, MD  ranitidine (ZANTAC)  300 MG tablet TAKE 1 TABLET BY MOUTH AT BEDTIME 12/30/14   Mosie Lukes, MD  rOPINIRole (REQUIP) 1 MG tablet Take 1 mg by mouth daily.      Historical Provider, MD  sertraline (ZOLOFT) 50 MG tablet Take 50 mg by mouth daily.    Historical Provider, MD  simvastatin (ZOCOR) 40 MG tablet Take 1 tablet (40 mg total) by mouth at bedtime. 04/16/14   Mosie Lukes, MD  temazepam (RESTORIL) 15 MG capsule Take 1 capsule (15 mg total) by mouth at bedtime as needed for  sleep. 12/10/14   Mosie Lukes, MD   BP 128/75 mmHg  Pulse 86  Temp(Src) 97.8 F (36.6 C) (Oral)  Resp 18  Ht 5\' 5"  (1.651 m)  Wt 205 lb (92.987 kg)  BMI 34.11 kg/m2  SpO2 100%  LMP 10/03/2012 Physical Exam  Constitutional: She is oriented to person, place, and time.  Anxious, tearful   HENT:  Head: Normocephalic.  Mouth/Throat: Oropharynx is clear and moist.  Eyes: Conjunctivae are normal. Pupils are equal, round, and reactive to light.  Neck: Normal range of motion. Neck supple.  Cardiovascular: Normal rate and regular rhythm.   Mild 1/6 systolic murmur   Pulmonary/Chest: Effort normal and breath sounds normal. No respiratory distress. She has no wheezes. She has no rales.  Abdominal: Soft. Bowel sounds are normal. She exhibits no distension. There is no tenderness. There is no rebound and no guarding.  Musculoskeletal:  L calf swelling, mild calf and upper thigh tenderness   Neurological: She is alert and oriented to person, place, and time.  Skin: Skin is warm and dry.  Psychiatric: She has a normal mood and affect. Her behavior is normal. Judgment and thought content normal.  Nursing note and vitals reviewed.   ED Course  Procedures (including critical care time) Labs Review Labs Reviewed  CBC WITH DIFFERENTIAL/PLATELET - Abnormal; Notable for the following:    RBC 5.42 (*)    Hemoglobin 15.4 (*)    HCT 46.1 (*)    All other components within normal limits  COMPREHENSIVE METABOLIC PANEL - Abnormal; Notable for  the following:    Potassium 2.8 (*)    Chloride 96 (*)    Glucose, Bld 102 (*)    Total Protein 8.5 (*)    Albumin 5.1 (*)    All other components within normal limits  TROPONIN I  BRAIN NATRIURETIC PEPTIDE    Imaging Review Dg Chest 2 View  01/22/2015   CLINICAL DATA:  59 year old female with cold symptoms, chest tightness and shortness of breath  EXAM: CHEST  2 VIEW  COMPARISON:  None.  FINDINGS: The lungs are clear and negative for focal airspace consolidation, pulmonary edema or suspicious pulmonary nodule. No pleural effusion or pneumothorax. Cardiac and mediastinal contours are within normal limits. No acute fracture or lytic or blastic osseous lesions. The visualized upper abdominal bowel gas pattern is unremarkable.  IMPRESSION: No active cardiopulmonary disease.   Electronically Signed   By: Jacqulynn Cadet M.D.   On: 01/22/2015 15:28   US Venous Img Lower Unilateral Left  01/22/2015   CLINICAL DATA:  Left groin pain for 3 weeks. Shortness of breath. Evaluate for DVT.  EXAM: LEFT LOWER EXTREMITY VENOUS DOPPLER ULTRASOUND  TECHNIQUE: Gray-scale sonography with graded compression, as well as color Doppler and duplex ultrasound were performed to evaluate the lower extremity deep venous systems from the level of the common femoral vein and including the common femoral, femoral, profunda femoral, popliteal and calf veins including the posterior tibial, peroneal and gastrocnemius veins when visible. The superficial great saphenous vein was also interrogated. Spectral Doppler was utilized to evaluate flow at rest and with distal augmentation maneuvers in the common femoral, femoral and popliteal veins.  COMPARISON:  None.  FINDINGS: Contralateral Common Femoral Vein: Respiratory phasicity is normal and symmetric with the symptomatic side. No evidence of thrombus. Normal compressibility.  Common Femoral Vein: No evidence of thrombus. Normal compressibility, respiratory phasicity and response to  augmentation.  Saphenofemoral Junction: No evidence of thrombus. Normal compressibility and flow  on color Doppler imaging.  Profunda Femoral Vein: No evidence of thrombus. Normal compressibility and flow on color Doppler imaging.  Femoral Vein: No evidence of thrombus. Normal compressibility, respiratory phasicity and response to augmentation.  Popliteal Vein: No evidence of thrombus. Normal compressibility, respiratory phasicity and response to augmentation.  Calf Veins: No evidence of thrombus. Normal compressibility and flow on color Doppler imaging.  Superficial Great Saphenous Vein: No evidence of thrombus. Normal compressibility and flow on color Doppler imaging.  Venous Reflux:  None.  Other Findings:  None.  IMPRESSION: No evidence of DVT within the left lower extremity.   Electronically Signed   By: Sandi Mariscal M.D.   On: 01/22/2015 16:58     EKG Interpretation   Date/Time:  Wednesday January 22 2015 15:50:14 EDT Ventricular Rate:  85 PR Interval:  180 QRS Duration: 72 QT Interval:  398 QTC Calculation: 473 R Axis:   12 Text Interpretation:  Normal sinus rhythm ST \\T \ T wave abnormality,  consider anterolateral ischemia Prolonged QT Abnormal ECG No significant  change since last tracing Confirmed by Sankalp Ferrell  MD, Dior Dominik ([id number]) on 01/22/2015  3:55:52 PM      MDM   Final diagnoses:  Leg pain    [name] is a 60 y.o. female here with chest pain, SOB with exertion for 2 months. Consider worsening aortic regurg but has no signs of heart failure. Will get BNP and CXR. Also consider DVT so will get L Leg [place]. Consider ACS but symptoms for 2 months so trop x 1 sufficient. Has cardiology f/u.   5:35 PM BNP nl, CXR showed no signs of pulmonary edema. Trop neg and EKG unchanged from previous. DVT study neg. K 2.8, likely from lasix use. I think likely has muscle cramps from hypokalemia. K supplemented. Will give several days of K dur. Will have her repeat labs in a week. Given zpack by PMD.  Encourage f/u with PCP and cardiology.    Wandra Arthurs, MD 01/22/15 [phone]

## 2015-01-22 NOTE — Discharge Instructions (Signed)
Take potassium as prescribed for 3 days.   Recheck potassium in a week.  Take your pain meds as prescribed.   See your doctor.   Return to ER if you have worse shortness of breath, chest pain.

## 2015-01-22 NOTE — ED Notes (Signed)
Pt transported to US

## 2015-01-22 NOTE — Progress Notes (Signed)
Subjective:    Patient ID: [name], female    DOB: 1955-12-06, 59 y.o.   MRN: [id number]  HPI  Pt has nasal congestion and sinus pressure. Pt states feels like needs to clear mucous but can't  Also chest congestion also but can't bring up mucous. Pt states symptoms since May.   Atypical lt shoulder pain with chest pressure and sob. See ros. Last time had this was yesterday.  Review of Systems  Constitutional: Negative for chills and fatigue.  HENT: Positive for congestion and sinus pressure. Negative for drooling.   Eyes: Positive for itching.  Respiratory: Negative for cough, chest tightness, shortness of breath and wheezing.   Cardiovascular: Negative for chest pain and palpitations.  Musculoskeletal: Negative for back pain.       Lt shoulder pain for a minute then went away. Heavy chest and some sob at that time.  Occurs once or twice a week. Pt is on lipitor.  Pt went to cardilogist in the past. But that was year or two ago. She thinks work up was negative except for leaky valve.   Symptoms for 2 months.  But not presently.  When gets pain in shoulder, chest and sob will last for about 5 minutes or less.  Neurological: Negative for dizziness and facial asymmetry.  Hematological: Negative for adenopathy. Does not bruise/bleed easily.   Past Medical History  Diagnosis Date  . Allergy     seasonal  . Anxiety   . Depression   . Hyperlipidemia   . History of proteinuria syndrome   . Edema extremities   . RLS (restless legs syndrome)   . Dyspareunia   . Obese   . Onychomycosis 05/09/2011  . Low back pain 05/09/2011  . RUQ pain 09/20/2011  . Hip pain, left 11/01/2011  . Lymphadenitis 06/16/2012  . Otitis externa 06/23/2012    left  . GERD (gastroesophageal reflux disease)   . Insomnia 03/11/2013  . Perimenopause 03/11/2013  . Acute sinusitis 03/27/2013  . Unspecified constipation 05/13/2013  . Arthritis 06/09/2013  . Urinary frequency 08/26/2013  . Oral lesion  08/26/2013  . Encounter for preventative adult health care exam with abnormal findings 04/21/2014  . Sleep apnea 04/21/2014    History   Social History  . Marital Status: Married    Spouse Name: N/A  . Number of Children: N/A  . Years of Education: N/A   Occupational History  . Not on file.   Social History Main Topics  . Smoking status: Former Smoker -- 0.50 packs/day for 20 years    Types: Cigarettes    Quit date: 07/12/1992  . Smokeless tobacco: Never Used  . Alcohol Use: Yes     Comment: occasionaly  . Drug Use: No  . Sexual Activity:    Partners: Male   Other Topics Concern  . Not on file   Social History Narrative    Past Surgical History  Procedure Laterality Date  . Cesarean section  1993  . Back surgery  2006    low, discectomy for herniated disc  . Tubal ligation  1993  . Carpal tunnel release Right 2006  . Bladder suspension N/A 11/07/2012    Procedure: [facility];  Surgeon: Malka So, MD;  Location: [facility];  Service: Urology;  Laterality: N/A;  . Cystoscopy N/A 11/07/2012    Procedure: CYSTOSCOPY;  Surgeon: Malka So, MD;  Location: [facility];  Service: Urology;  Laterality: N/A;  Family History  Problem Relation Age of Onset  . Cancer Mother 43    thyroid, malignant brain tumor  . Arthritis Mother     neck, back  . Hepatitis Sister     Hepatitis C  . Heart disease Sister     stent  . Diabetes Maternal Grandmother   . Arthritis Father     back pain  . Heart disease Father     Allergies  Allergen Reactions  . Carbamazepine Hives  . Ceclor [Cefaclor]     Tongue swell    Current Outpatient Prescriptions on File Prior to Visit  Medication Sig Dispense Refill  . atorvastatin (LIPITOR) 40 MG tablet Take 1 tablet (40 mg total) by mouth at bedtime. 30 tablet 3  . BIOTIN PO Take by mouth.    Marland Kitchen buPROPion (WELLBUTRIN SR) 150 MG 12 hr tablet Take 150 mg by mouth daily.    . Calcium Carbonate-Vitamin D  (CALTRATE 600+D) 600-400 MG-UNIT per tablet Take 1 tablet by mouth daily.     . Cholecalciferol (VITAMIN D3) 1000 UNITS CAPS Take by mouth.      . conjugated estrogens (PREMARIN) vaginal cream Place vaginally as directed. 3 times a week at bedtime    . diazepam (VALIUM) 10 MG tablet Take 1 tablet (10 mg total) by mouth daily as needed for anxiety. 40 tablet 2  . FLAXSEED, LINSEED, PO Take by mouth every other day.     . furosemide (LASIX) 40 MG tablet TAKE 1 TABLET BY MOUTH DAILY AND A SECOND TABLET AS NEEDED FOR EDEMA 135 tablet 1  . Gabapentin Enacarbil 600 MG TBCR Take by mouth 2 (two) times daily.    Marland Kitchen HYDROcodone-acetaminophen (NORCO) 7.5-325 MG per tablet Take 1 tablet by mouth every 6 (six) hours as needed for moderate pain. 30 tablet 0  . hydrOXYzine (ATARAX/VISTARIL) 50 MG tablet Take 50 mg by mouth every 6 (six) hours as needed.    . lamoTRIgine (LAMICTAL) 200 MG tablet Take 200 mg by mouth daily.      . metolazone (ZAROXOLYN) 2.5 MG tablet Take 1 tablet (2.5 mg total) by mouth 2 (two) times daily as needed. 30 tablet 2  . Nutritional Supplements (ESTROVEN PO) Take by mouth.    . pantoprazole (PROTONIX) 40 MG tablet Take 1 tablet (40 mg total) by mouth daily. 90 tablet 1  . potassium chloride SA (K-DUR,KLOR-CON) 20 MEQ tablet 1 tab daily and a second tab daily as needed for edema, sob, 135 tablet 1  . promethazine (PHENERGAN) 25 MG tablet Take 1 tablet (25 mg total) by mouth every 8 (eight) hours as needed for nausea. 40 tablet 5  . ranitidine (ZANTAC) 300 MG tablet TAKE 1 TABLET BY MOUTH AT BEDTIME 30 tablet 3  . rOPINIRole (REQUIP) 1 MG tablet Take 1 mg by mouth daily.      . sertraline (ZOLOFT) 50 MG tablet Take 50 mg by mouth daily.    . simvastatin (ZOCOR) 40 MG tablet Take 1 tablet (40 mg total) by mouth at bedtime. 90 tablet 1  . temazepam (RESTORIL) 15 MG capsule Take 1 capsule (15 mg total) by mouth at bedtime as needed for sleep. 30 capsule 5  . albuterol (PROVENTIL  HFA;VENTOLIN HFA) 108 (90 BASE) MCG/ACT inhaler Inhale 2 puffs into the lungs every 6 (six) hours as needed for wheezing or shortness of breath. (Patient not taking: Reported on 12/10/2014) 1 Inhaler 2  . fluticasone (FLONASE) 50 MCG/ACT nasal spray Place 2 sprays into both  nostrils daily. 16 g 6   No current facility-administered medications on file prior to visit.    BP 122/72 mmHg  Pulse 84  Temp(Src) 98.2 F (36.8 C) (Oral)  Ht 5\' 5"  (1.651 m)  Wt 205 lb (92.987 kg)  BMI 34.11 kg/m2  SpO2 96%  LMP 10/03/2012       Objective:   Physical Exam  General  Mental Status - Alert. General Appearance - Well groomed. Not in acute distress.  Skin Rashes- No Rashes.  HEENT Head- Normal. Ear Auditory Canal - Left- Normal. Right - Normal.Tympanic Membrane- Left- Normal. Right- Normal. Eye Sclera/Conjunctiva- Left- Normal. Right- Normal. Nose & Sinuses Nasal Mucosa- Left-  Boggy and Congested. Right-  Boggy and  Congested.Bilateral maxillary and frontal sinus pressure. Mouth & Throat Lips: Upper Lip- Normal: no dryness, cracking, pallor, cyanosis, or vesicular eruption. Lower Lip-Normal: no dryness, cracking, pallor, cyanosis or vesicular eruption. Buccal Mucosa- Bilateral- No Aphthous ulcers. Oropharynx- No Discharge or Erythema. Tonsils: Characteristics- Bilateral- No Erythema or Congestion. Size/Enlargement- Bilateral- No enlargement. Discharge- bilateral-None.  Neck Neck- Supple. No Masses.   Chest and Lung Exam Auscultation: Breath Sounds:-Clear even and unlabored.  Cardiovascular Auscultation:Rythm- Regular, rate and rhythm. Murmurs & Other Heart Sounds:Ausculatation of the heart reveal- No Murmurs.  Lymphatic Head & Neck General Head & Neck Lymphatics: Bilateral: Description- No Localized lymphadenopathy.  Lt shoulder- from, no pain on palpation.        Assessment & Plan:

## 2015-01-22 NOTE — Progress Notes (Signed)
Pre visit review using our clinic review tool, if applicable. No additional management support is needed unless otherwise documented below in the visit note. 

## 2015-01-22 NOTE — Assessment & Plan Note (Signed)
ekg and troponin today. Pt has no shoulder pain today. No chest pain or dyspnea. No homans sign. But since some yesterday and recurrent past 2 months will get ekg, cxr adn troponin today. Refer back to cardiology stat.  During interim recurrent symptoms then ED eval.

## 2015-01-30 ENCOUNTER — Encounter: Payer: Self-pay | Admitting: Family Medicine

## 2015-01-30 ENCOUNTER — Ambulatory Visit (INDEPENDENT_AMBULATORY_CARE_PROVIDER_SITE_OTHER): Payer: BLUE CROSS/BLUE SHIELD | Admitting: Family Medicine

## 2015-01-30 VITALS — BP 122/80 | HR 98 | Temp 98.7°F | Resp 18 | Ht 65.0 in | Wt 203.6 lb

## 2015-01-30 DIAGNOSIS — M609 Myositis, unspecified: Secondary | ICD-10-CM

## 2015-01-30 DIAGNOSIS — IMO0001 Reserved for inherently not codable concepts without codable children: Secondary | ICD-10-CM

## 2015-01-30 DIAGNOSIS — K5900 Constipation, unspecified: Secondary | ICD-10-CM

## 2015-01-30 DIAGNOSIS — M791 Myalgia: Secondary | ICD-10-CM | POA: Diagnosis not present

## 2015-01-30 DIAGNOSIS — E785 Hyperlipidemia, unspecified: Secondary | ICD-10-CM | POA: Diagnosis not present

## 2015-01-30 DIAGNOSIS — R197 Diarrhea, unspecified: Secondary | ICD-10-CM | POA: Diagnosis not present

## 2015-01-30 DIAGNOSIS — E876 Hypokalemia: Secondary | ICD-10-CM

## 2015-01-30 MED ORDER — POTASSIUM CHLORIDE CRYS ER 20 MEQ PO TBCR: EXTENDED_RELEASE_TABLET | ORAL | Status: DC

## 2015-01-30 MED ORDER — FUROSEMIDE 40 MG PO TABS: ORAL_TABLET | ORAL | Status: DC

## 2015-01-30 NOTE — Progress Notes (Signed)
Pre visit review using our clinic review tool, if applicable. No additional management support is needed unless otherwise documented below in the visit note. 

## 2015-01-30 NOTE — Patient Instructions (Signed)

## 2015-02-06 ENCOUNTER — Other Ambulatory Visit (INDEPENDENT_AMBULATORY_CARE_PROVIDER_SITE_OTHER): Payer: BLUE CROSS/BLUE SHIELD

## 2015-02-06 DIAGNOSIS — R197 Diarrhea, unspecified: Secondary | ICD-10-CM | POA: Diagnosis not present

## 2015-02-06 DIAGNOSIS — E876 Hypokalemia: Secondary | ICD-10-CM

## 2015-02-06 LAB — COMPREHENSIVE METABOLIC PANEL
ALBUMIN: 4.1 g/dL (ref 3.5–5.2)
ALT: 17 U/L (ref 0–35)
AST: 14 U/L (ref 0–37)
Alkaline Phosphatase: 69 U/L (ref 39–117)
BUN: 12 mg/dL (ref 6–23)
CALCIUM: 9.3 mg/dL (ref 8.4–10.5)
CHLORIDE: 107 meq/L (ref 96–112)
CO2: 29 mEq/L (ref 19–32)
CREATININE: 0.81 mg/dL (ref 0.40–1.20)
GFR: 77.44 mL/min (ref >60.00)
Glucose, Bld: 83 mg/dL (ref 70–99)
Potassium: 3.8 mEq/L (ref 3.5–5.1)
SODIUM: 141 meq/L (ref 135–145)
TOTAL PROTEIN: 6.7 g/dL (ref 6.0–8.3)
Total Bilirubin: 0.4 mg/dL (ref 0.2–1.2)

## 2015-02-07 ENCOUNTER — Telehealth: Payer: Self-pay

## 2015-02-07 NOTE — Telephone Encounter (Signed)
Pt notified of results verbalized understanding no questions or concerns at this time.

## 2015-02-07 NOTE — Telephone Encounter (Signed)
-----   Message from Mosie Lukes, MD sent at 02/06/2015 10:21 PM EDT ----- Notify cmp was normal

## 2015-02-13 ENCOUNTER — Encounter: Payer: Self-pay | Admitting: Medical

## 2015-02-13 ENCOUNTER — Ambulatory Visit (INDEPENDENT_AMBULATORY_CARE_PROVIDER_SITE_OTHER): Payer: BLUE CROSS/BLUE SHIELD | Admitting: Medical

## 2015-02-13 VITALS — BP 119/55 | HR 73 | Temp 97.6°F | Ht 65.0 in | Wt 206.6 lb

## 2015-02-13 DIAGNOSIS — J0100 Acute maxillary sinusitis, unspecified: Secondary | ICD-10-CM

## 2015-02-13 MED ORDER — LEVOFLOXACIN 500 MG PO TABS: 500.0000 mg | ORAL_TABLET | Freq: Every day | ORAL | Status: DC

## 2015-02-13 MED ORDER — BENZONATATE 100 MG PO CAPS: 100.0000 mg | ORAL_CAPSULE | Freq: Three times a day (TID) | ORAL | Status: DC | PRN

## 2015-02-13 NOTE — Progress Notes (Signed)
Pre visit review using our clinic review tool, if applicable. No additional management support is needed unless otherwise documented below in the visit note. 

## 2015-02-13 NOTE — Progress Notes (Signed)
Subjective:    Patient ID: [name], female    DOB: 19-Dec-1955, 59 y.o.   MRN: [id number]  HPI  Pt in cough and some chest congestion. Some sinus congestion + sinus pressure. Some pain left maxillary sinus worse on left side. Left ear pain. Mild cough on and off since June.   Last 10 days symptoms worsened. Some productive cough    Review of Systems  Constitutional: Positive for fatigue. Negative for fever and chills.       Mild fatigue  HENT: Positive for congestion, ear pain and sinus pressure. Negative for mouth sores, postnasal drip, rhinorrhea and sore throat.   Respiratory: Positive for cough. Negative for choking, shortness of breath and wheezing.   Cardiovascular: Negative for chest pain and palpitations.  Musculoskeletal: Negative for back pain.  Hematological: Negative for adenopathy. Does not bruise/bleed easily.  Psychiatric/Behavioral: Negative for behavioral problems and confusion.    Past Medical History  Diagnosis Date  . Allergy     seasonal  . Anxiety   . Depression   . Hyperlipidemia   . History of proteinuria syndrome   . Edema extremities   . RLS (restless legs syndrome)   . Dyspareunia   . Obese   . Onychomycosis 05/09/2011  . Low back pain 05/09/2011  . RUQ pain 09/20/2011  . Hip pain, left 11/01/2011  . Lymphadenitis 06/16/2012  . Otitis externa 06/23/2012    left  . GERD (gastroesophageal reflux disease)   . Insomnia 03/11/2013  . Perimenopause 03/11/2013  . Acute sinusitis 03/27/2013  . Unspecified constipation 05/13/2013  . Arthritis 06/09/2013  . Urinary frequency 08/26/2013  . Oral lesion 08/26/2013  . Encounter for preventative adult health care exam with abnormal findings 04/21/2014  . Sleep apnea 04/21/2014    History   Social History  . Marital Status: Married    Spouse Name: N/A  . Number of Children: N/A  . Years of Education: N/A   Occupational History  . Not on file.   Social History Main Topics  . Smoking status:  Former Smoker -- 0.50 packs/day for 20 years    Types: Cigarettes    Quit date: 07/12/1992  . Smokeless tobacco: Never Used  . Alcohol Use: Yes     Comment: occasionaly  . Drug Use: No  . Sexual Activity:    Partners: Male   Other Topics Concern  . Not on file   Social History Narrative    Past Surgical History  Procedure Laterality Date  . Cesarean section  1993  . Back surgery  2006    low, discectomy for herniated disc  . Tubal ligation  1993  . Carpal tunnel release Right 2006  . Bladder suspension N/A 11/07/2012    Procedure: [facility];  Surgeon: Malka So, MD;  Location: [facility];  Service: Urology;  Laterality: N/A;  . Cystoscopy N/A 11/07/2012    Procedure: CYSTOSCOPY;  Surgeon: Malka So, MD;  Location: [facility];  Service: Urology;  Laterality: N/A;    Family History  Problem Relation Age of Onset  . Cancer Mother 31    thyroid, malignant brain tumor  . Arthritis Mother     neck, back  . Hepatitis Sister     Hepatitis C  . Heart disease Sister     stent  . Diabetes Maternal Grandmother   . Arthritis Father     back pain  . Heart disease Father     Allergies  Allergen Reactions  . Carbamazepine Hives  . Ceclor [Cefaclor]     Tongue swell  . Morphine And Related Itching    Current Outpatient Prescriptions on File Prior to Visit  Medication Sig Dispense Refill  . albuterol (PROVENTIL HFA;VENTOLIN HFA) 108 (90 BASE) MCG/ACT inhaler Inhale 2 puffs into the lungs every 6 (six) hours as needed for wheezing or shortness of breath. 1 Inhaler 2  . baclofen (LIORESAL) 20 MG tablet   4  . BIOTIN PO Take by mouth.    Marland Kitchen buPROPion (WELLBUTRIN SR) 150 MG 12 hr tablet Take 150 mg by mouth daily.    . Calcium Carbonate-Vitamin D (CALTRATE 600+D) 600-400 MG-UNIT per tablet Take 1 tablet by mouth daily.     . Cholecalciferol (VITAMIN D3) 1000 UNITS CAPS Take by mouth.      . conjugated estrogens (PREMARIN) vaginal cream Place  vaginally as directed. 3 times a week at bedtime    . diazepam (VALIUM) 10 MG tablet Take 1 tablet (10 mg total) by mouth daily as needed for anxiety. 40 tablet 2  . FLAXSEED, LINSEED, PO Take by mouth every other day.     . fluticasone (FLONASE) 50 MCG/ACT nasal spray Place 2 sprays into both nostrils daily. 16 g 6  . furosemide (LASIX) 40 MG tablet 2 tabs po qd x 3 days then 1 tab po daily 120 tablet 1  . Gabapentin Enacarbil 600 MG TBCR Take by mouth 2 (two) times daily.    Marland Kitchen HYDROcodone-acetaminophen (NORCO) 7.5-325 MG per tablet Take 1 tablet by mouth every 6 (six) hours as needed for moderate pain. 30 tablet 0  . hydrOXYzine (ATARAX/VISTARIL) 50 MG tablet Take 50 mg by mouth every 6 (six) hours as needed.    . lamoTRIgine (LAMICTAL) 200 MG tablet Take 200 mg by mouth daily.      . metolazone (ZAROXOLYN) 2.5 MG tablet Take 1 tablet (2.5 mg total) by mouth 2 (two) times daily as needed. 30 tablet 2  . Nutritional Supplements (ESTROVEN PO) Take by mouth.    . pantoprazole (PROTONIX) 40 MG tablet Take 1 tablet (40 mg total) by mouth daily. 90 tablet 1  . potassium chloride SA (K-DUR,KLOR-CON) 20 MEQ tablet 2 tabs po qd x 5 days then 1 tab po daily 120 tablet 1  . promethazine (PHENERGAN) 25 MG tablet Take 1 tablet (25 mg total) by mouth every 8 (eight) hours as needed for nausea. 40 tablet 5  . ranitidine (ZANTAC) 300 MG tablet TAKE 1 TABLET BY MOUTH AT BEDTIME 30 tablet 3  . rOPINIRole (REQUIP) 1 MG tablet Take 1 mg by mouth daily.      . temazepam (RESTORIL) 15 MG capsule Take 1 capsule (15 mg total) by mouth at bedtime as needed for sleep. 30 capsule 5  . atorvastatin (LIPITOR) 40 MG tablet Take 1 tablet (40 mg total) by mouth at bedtime. 30 tablet 3   No current facility-administered medications on file prior to visit.    BP 119/55 mmHg  Pulse 73  Temp(Src) 97.6 F (36.4 C) (Oral)  Ht 5\' 5"  (1.651 m)  Wt 206 lb 9.6 oz (93.713 kg)  BMI 34.38 kg/m2  SpO2 100%  LMP 10/03/2012        Objective:   Physical Exam  General  Mental Status - Alert. General Appearance - Well groomed. Not in acute distress.  Skin Rashes- No Rashes.  HEENT Head- Normal. Ear Auditory Canal - Left- Normal. Right - Normal.Tympanic Membrane- Left- mild dull .  Right- Normal. Eye Sclera/Conjunctiva- Left- Normal. Right- Normal. Nose & Sinuses Nasal Mucosa- Left-  Boggy and Congested. Right-  Boggy and  Congested.Bilateral maxillary and frontal sinus pressure.(lt side maxillary pain is the worse) Mouth & Throat Lips: Upper Lip- Normal: no dryness, cracking, pallor, cyanosis, or vesicular eruption. Lower Lip-Normal: no dryness, cracking, pallor, cyanosis or vesicular eruption. Buccal Mucosa- Bilateral- No Aphthous ulcers. Oropharynx- No Discharge or Erythema. Tonsils: Characteristics- Bilateral- No Erythema or Congestion. Size/Enlargement- Bilateral- No enlargement. Discharge- bilateral-None.  Neck Neck- Supple. No Masses.   Chest and Lung Exam Auscultation: Breath Sounds:-Clear even and unlabored.  Cardiovascular Auscultation:Rythm- Regular, rate and rhythm. Murmurs & Other Heart Sounds:Ausculatation of the heart reveal- No Murmurs.  Lymphatic Head & Neck General Head & Neck Lymphatics: Bilateral: Description- No Localized lymphadenopathy.         Assessment & Plan:  You appear to have a sinus infection. I am prescribing levofloxin antibiotic for the infection. To help with the nasal congestion I prescribed flonase nasal steroid. For your associated cough, I prescribed cough medicine benzonatate  Rest, hydrate, tylenol for fever.  Follow up in 7 days or as needed.

## 2015-02-13 NOTE — Patient Instructions (Addendum)
You appear to have a sinus infection. I am prescribing levofloxin antibiotic for the infection. To help with the nasal congestion I prescribed flonase nasal steroid. For your associated cough, I prescribed cough medicine benzonatate  Rest, hydrate, tylenol for fever.  Follow up in 7 days or as needed.

## 2015-02-15 ENCOUNTER — Encounter: Payer: Self-pay | Admitting: Family Medicine

## 2015-02-15 DIAGNOSIS — IMO0001 Reserved for inherently not codable concepts without codable children: Secondary | ICD-10-CM

## 2015-02-15 DIAGNOSIS — M791 Myalgia, unspecified site: Secondary | ICD-10-CM | POA: Insufficient documentation

## 2015-02-15 HISTORY — DX: Reserved for inherently not codable concepts without codable children: IMO0001

## 2015-02-15 NOTE — Assessment & Plan Note (Signed)
Encouraged adequate hydration, activity and stretching. Labs normal

## 2015-02-15 NOTE — Progress Notes (Signed)
[name]  [id number] May 19, 1956 02/15/2015      Progress Note-Follow Up  Subjective  Chief Complaint  Chief Complaint  Patient presents with  . Follow-up    Pt here for ED f/u.     HPI  Patient is a 59 y.o. female in today for routine medical care. Patient is in today for  Continues to struggle numerous concerns. But is managing adequately. She struggles with anxiety and deprssion but denies suicidal or homicidal ideation. No recent febrile illness. Denies CP/palp/SOB/HA/congestion/fevers/GI or GU c/o. Taking meds as prescribed  Past Medical History  Diagnosis Date  . Allergy     seasonal  . Anxiety   . Depression   . Hyperlipidemia   . History of proteinuria syndrome   . Edema extremities   . RLS (restless legs syndrome)   . Dyspareunia   . Obese   . Onychomycosis 05/09/2011  . Low back pain 05/09/2011  . RUQ pain 09/20/2011  . Hip pain, left 11/01/2011  . Lymphadenitis 06/16/2012  . Otitis externa 06/23/2012    left  . GERD (gastroesophageal reflux disease)   . Insomnia 03/11/2013  . Perimenopause 03/11/2013  . Acute sinusitis 03/27/2013  . Unspecified constipation 05/13/2013  . Arthritis 06/09/2013  . Urinary frequency 08/26/2013  . Oral lesion 08/26/2013  . Encounter for preventative adult health care exam with abnormal findings 04/21/2014  . Sleep apnea 04/21/2014  . Myalgia and myositis 02/15/2015    Past Surgical History  Procedure Laterality Date  . Cesarean section  1993  . Back surgery  2006    low, discectomy for herniated disc  . Tubal ligation  1993  . Carpal tunnel release Right 2006  . Bladder suspension N/A 11/07/2012    Procedure: [facility];  Surgeon: Malka So, MD;  Location: [facility];  Service: Urology;  Laterality: N/A;  . Cystoscopy N/A 11/07/2012    Procedure: CYSTOSCOPY;  Surgeon: Malka So, MD;  Location: [facility];  Service: Urology;  Laterality: N/A;    Family History  Problem Relation Age  of Onset  . Cancer Mother 49    thyroid, malignant brain tumor  . Arthritis Mother     neck, back  . Hepatitis Sister     Hepatitis C  . Heart disease Sister     stent  . Diabetes Maternal Grandmother   . Arthritis Father     back pain  . Heart disease Father     History   Social History  . Marital Status: Married    Spouse Name: N/A  . Number of Children: N/A  . Years of Education: N/A   Occupational History  . Not on file.   Social History Main Topics  . Smoking status: Former Smoker -- 0.50 packs/day for 20 years    Types: Cigarettes    Quit date: 07/12/1992  . Smokeless tobacco: Never Used  . Alcohol Use: Yes     Comment: occasionaly  . Drug Use: No  . Sexual Activity:    Partners: Male   Other Topics Concern  . Not on file   Social History Narrative    Current Outpatient Prescriptions on File Prior to Visit  Medication Sig Dispense Refill  . albuterol (PROVENTIL HFA;VENTOLIN HFA) 108 (90 BASE) MCG/ACT inhaler Inhale 2 puffs into the lungs every 6 (six) hours as needed for wheezing or shortness of breath. 1 Inhaler 2  . atorvastatin (LIPITOR) 40 MG tablet Take 1 tablet (40 mg  total) by mouth at bedtime. 30 tablet 3  . BIOTIN PO Take by mouth.    Marland Kitchen buPROPion (WELLBUTRIN SR) 150 MG 12 hr tablet Take 150 mg by mouth daily.    . Calcium Carbonate-Vitamin D (CALTRATE 600+D) 600-400 MG-UNIT per tablet Take 1 tablet by mouth daily.     . Cholecalciferol (VITAMIN D3) 1000 UNITS CAPS Take by mouth.      . conjugated estrogens (PREMARIN) vaginal cream Place vaginally as directed. 3 times a week at bedtime    . diazepam (VALIUM) 10 MG tablet Take 1 tablet (10 mg total) by mouth daily as needed for anxiety. 40 tablet 2  . FLAXSEED, LINSEED, PO Take by mouth every other day.     . fluticasone (FLONASE) 50 MCG/ACT nasal spray Place 2 sprays into both nostrils daily. 16 g 6  . Gabapentin Enacarbil 600 MG TBCR Take by mouth 2 (two) times daily.    Marland Kitchen  HYDROcodone-acetaminophen (NORCO) 7.5-325 MG per tablet Take 1 tablet by mouth every 6 (six) hours as needed for moderate pain. 30 tablet 0  . hydrOXYzine (ATARAX/VISTARIL) 50 MG tablet Take 50 mg by mouth every 6 (six) hours as needed.    . lamoTRIgine (LAMICTAL) 200 MG tablet Take 200 mg by mouth daily.      . metolazone (ZAROXOLYN) 2.5 MG tablet Take 1 tablet (2.5 mg total) by mouth 2 (two) times daily as needed. 30 tablet 2  . Nutritional Supplements (ESTROVEN PO) Take by mouth.    . pantoprazole (PROTONIX) 40 MG tablet Take 1 tablet (40 mg total) by mouth daily. 90 tablet 1  . promethazine (PHENERGAN) 25 MG tablet Take 1 tablet (25 mg total) by mouth every 8 (eight) hours as needed for nausea. 40 tablet 5  . ranitidine (ZANTAC) 300 MG tablet TAKE 1 TABLET BY MOUTH AT BEDTIME 30 tablet 3  . rOPINIRole (REQUIP) 1 MG tablet Take 1 mg by mouth daily.      . temazepam (RESTORIL) 15 MG capsule Take 1 capsule (15 mg total) by mouth at bedtime as needed for sleep. 30 capsule 5   No current facility-administered medications on file prior to visit.    Allergies  Allergen Reactions  . Carbamazepine Hives  . Ceclor [Cefaclor]     Tongue swell  . Morphine And Related Itching    Review of Systems  Review of Systems  Constitutional: Negative for fever and malaise/fatigue.  HENT: Negative for congestion.   Eyes: Negative for discharge.  Respiratory: Negative for shortness of breath.   Cardiovascular: Negative for chest pain, palpitations and leg swelling.  Gastrointestinal: Positive for abdominal pain. Negative for nausea and diarrhea.  Genitourinary: Negative for dysuria.  Musculoskeletal: Positive for back pain. Negative for falls.  Skin: Negative for rash.  Neurological: Negative for loss of consciousness and headaches.  Endo/Heme/Allergies: Negative for polydipsia.  Psychiatric/Behavioral: Negative for depression and suicidal ideas. The patient is not nervous/anxious and does not have  insomnia.     Objective  BP 122/80 mmHg  Pulse 98  Temp(Src) 98.7 F (37.1 C) (Oral)  Resp 18  Ht 5\' 5"  (1.651 m)  Wt 203 lb 9.6 oz (92.352 kg)  BMI 33.88 kg/m2  SpO2 99%  LMP 10/03/2012  Physical Exam  Physical Exam  Constitutional: She is oriented to person, place, and time and well-developed, well-nourished, and in no distress. No distress.  HENT:  Head: Normocephalic and atraumatic.  Eyes: Conjunctivae are normal.  Neck: Neck supple. No thyromegaly present.  Cardiovascular:  Normal rate, regular rhythm and normal heart sounds.   No murmur heard. Pulmonary/Chest: Effort normal and breath sounds normal. She has no wheezes.  Abdominal: She exhibits no distension and no mass.  Musculoskeletal: She exhibits no edema.  Lymphadenopathy:    She has no cervical adenopathy.  Neurological: She is alert and oriented to person, place, and time.  Skin: Skin is warm and dry. No rash noted. She is not diaphoretic.  Psychiatric: Memory, affect and judgment normal.    Lab Results  Component Value Date   TSH 1.85 12/10/2014   Lab Results  Component Value Date   WBC 10.0 01/22/2015   HGB 15.4* 01/22/2015   HCT 46.1* 01/22/2015   MCV 85.1 01/22/2015   PLT 337 01/22/2015   Lab Results  Component Value Date   CREATININE 0.81 02/06/2015   BUN 12 02/06/2015   NA 141 02/06/2015   K 3.8 02/06/2015   CL 107 02/06/2015   CO2 29 02/06/2015   Lab Results  Component Value Date   ALT 17 02/06/2015   AST 14 02/06/2015   ALKPHOS 69 02/06/2015   BILITOT 0.4 02/06/2015   Lab Results  Component Value Date   CHOL 234* 12/10/2014   Lab Results  Component Value Date   HDL 48.90 12/10/2014   Lab Results  Component Value Date   LDLCALC 117* 04/18/2014   Lab Results  Component Value Date   TRIG 398.0* 12/10/2014   Lab Results  Component Value Date   CHOLHDL 5 12/10/2014     Assessment & Plan  Hyperlipidemia Encouraged heart healthy diet, increase exercise, avoid trans  fats, consider a krill oil cap daily, continue statins  Constipation Avoid offending foods, take probiotics. Do not eat large meals in late evening and consider raising head of bed.   Myalgia and myositis Encouraged adequate hydration, activity and stretching. Labs normal

## 2015-02-15 NOTE — Assessment & Plan Note (Signed)
Avoid offending foods, take probiotics. Do not eat large meals in late evening and consider raising head of bed.  

## 2015-02-15 NOTE — Assessment & Plan Note (Signed)
Encouraged heart healthy diet, increase exercise, avoid trans fats, consider a krill oil cap daily, continue statins

## 2015-02-27 ENCOUNTER — Ambulatory Visit: Payer: BLUE CROSS/BLUE SHIELD | Admitting: Family Medicine

## 2015-02-27 ENCOUNTER — Telehealth: Payer: Self-pay | Admitting: Family Medicine

## 2015-03-03 ENCOUNTER — Telehealth: Payer: Self-pay | Admitting: Family Medicine

## 2015-03-03 MED ORDER — SIMVASTATIN 40 MG PO TABS: 40.0000 mg | ORAL_TABLET | Freq: Every day | ORAL | Status: DC

## 2015-03-03 NOTE — Telephone Encounter (Signed)
Updated medication list, sent in simvastatin to [facility].  Please advise on hydrocodone refill (request below)

## 2015-03-03 NOTE — Telephone Encounter (Signed)
OK to d/c Atorvastatin and restart Simvastatin 40 mg po qhs, disp #30 with 5 rf

## 2015-03-03 NOTE — Telephone Encounter (Signed)
°  Relation to KM:MNOT Call back number:[phone] Pharmacy:[facility]  Reason for call: pt states dr. Charlett Blake had taken her off of her cholesterol meds and gave her lipitor however the pts states she can not take it because it hurts her legs real bad and needs to go back to her original meds.  Also needs a refill on her HYDROcodone-acetaminophen (NORCO) 7.5-325 MG per tablet

## 2015-03-03 NOTE — Telephone Encounter (Signed)
Pt was no show 02/27/15 11:15am, follow up appt, 02/27/15 10:26am pt had to cancel appt husband was rushed to emergency room very ill family had to go to the er immediately, I'm assuming not to charge for no show?

## 2015-03-03 NOTE — Telephone Encounter (Signed)
OK to refill Hydrocodone, same sig, same strength, same number

## 2015-03-03 NOTE — Telephone Encounter (Signed)
No charge. 

## 2015-03-03 NOTE — Telephone Encounter (Signed)
Advise on cholesterol medication request  Hydrocodone was last filled 01/17/15  #30 Last OV was on 01/30/15 Contract signed on 04/18/14 UDS done and results High Risk

## 2015-03-04 MED ORDER — HYDROCODONE-ACETAMINOPHEN 7.5-325 MG PO TABS: 1.0000 | ORAL_TABLET | Freq: Four times a day (QID) | ORAL | Status: DC | PRN

## 2015-03-04 NOTE — Addendum Note (Signed)
Addended by: Sharon Seller B on: 03/04/2015 07:37 AM   Modules accepted: Orders

## 2015-03-04 NOTE — Telephone Encounter (Signed)
Called the patient informed to pickup hardcopy for hydrocodone at the front desk once signed by PCP.

## 2015-04-18 ENCOUNTER — Ambulatory Visit (INDEPENDENT_AMBULATORY_CARE_PROVIDER_SITE_OTHER): Payer: Medicare Other | Admitting: Family Medicine

## 2015-04-18 ENCOUNTER — Ambulatory Visit (HOSPITAL_BASED_OUTPATIENT_CLINIC_OR_DEPARTMENT_OTHER)
Admission: RE | Admit: 2015-04-18 | Discharge: 2015-04-18 | Disposition: A | Discharge status: Home / Self Care | Payer: Medicare Other | Source: Ambulatory Visit | Attending: Family Medicine | Admitting: Family Medicine

## 2015-04-18 ENCOUNTER — Encounter: Payer: Self-pay | Admitting: Family Medicine

## 2015-04-18 VITALS — BP 91/55 | HR 73 | Temp 98.1°F | Ht 65.0 in | Wt 200.2 lb

## 2015-04-18 DIAGNOSIS — R1084 Generalized abdominal pain: Secondary | ICD-10-CM | POA: Diagnosis not present

## 2015-04-18 DIAGNOSIS — I959 Hypotension, unspecified: Secondary | ICD-10-CM

## 2015-04-18 DIAGNOSIS — E785 Hyperlipidemia, unspecified: Secondary | ICD-10-CM | POA: Diagnosis not present

## 2015-04-18 DIAGNOSIS — R5383 Other fatigue: Secondary | ICD-10-CM

## 2015-04-18 DIAGNOSIS — G4700 Insomnia, unspecified: Secondary | ICD-10-CM | POA: Diagnosis not present

## 2015-04-18 DIAGNOSIS — Z0000 Encounter for general adult medical examination without abnormal findings: Secondary | ICD-10-CM

## 2015-04-18 DIAGNOSIS — R739 Hyperglycemia, unspecified: Secondary | ICD-10-CM | POA: Diagnosis not present

## 2015-04-18 DIAGNOSIS — Z780 Asymptomatic menopausal state: Secondary | ICD-10-CM | POA: Diagnosis not present

## 2015-04-18 DIAGNOSIS — Z23 Encounter for immunization: Secondary | ICD-10-CM | POA: Diagnosis not present

## 2015-04-18 DIAGNOSIS — K219 Gastro-esophageal reflux disease without esophagitis: Secondary | ICD-10-CM

## 2015-04-18 DIAGNOSIS — E669 Obesity, unspecified: Secondary | ICD-10-CM

## 2015-04-18 LAB — COMPREHENSIVE METABOLIC PANEL
ALBUMIN: 4.2 g/dL (ref 3.5–5.2)
ALK PHOS: 68 U/L (ref 39–117)
ALT: 18 U/L (ref 0–35)
AST: 16 U/L (ref 0–37)
BUN: 11 mg/dL (ref 6–23)
CO2: 30 mEq/L (ref 19–32)
CREATININE: 0.87 mg/dL (ref 0.40–1.20)
Calcium: 9.6 mg/dL (ref 8.4–10.5)
Chloride: 108 mEq/L (ref 96–112)
GFR: 71.26 mL/min (ref >60.00)
GLUCOSE: 88 mg/dL (ref 70–99)
Potassium: 4.7 mEq/L (ref 3.5–5.1)
SODIUM: 144 meq/L (ref 135–145)
TOTAL PROTEIN: 6.8 g/dL (ref 6.0–8.3)
Total Bilirubin: 0.7 mg/dL (ref 0.2–1.2)

## 2015-04-18 LAB — TSH: TSH: 1.38 u[IU]/mL (ref 0.35–4.50)

## 2015-04-18 LAB — CBC
HCT: 43 % (ref 36.0–46.0)
Hemoglobin: 14.1 g/dL (ref 12.0–15.0)
MCHC: 32.9 g/dL (ref 30.0–36.0)
MCV: 84.4 fl (ref 78.0–100.0)
Platelets: 275 10*3/uL (ref 150.0–400.0)
RBC: 5.09 Mil/uL (ref 3.87–5.11)
RDW: 14.1 % (ref 11.5–15.5)
WBC: 6.1 10*3/uL (ref 4.0–10.5)

## 2015-04-18 LAB — HIV ANTIBODY (ROUTINE TESTING W REFLEX): HIV: NONREACTIVE

## 2015-04-18 LAB — LIPID PANEL
Cholesterol: 200 mg/dL (ref 0–200)
HDL: 42 mg/dL (ref >39.00)
NONHDL: 158.26
Total CHOL/HDL Ratio: 5
Triglycerides: 238 mg/dL — ABNORMAL HIGH (ref 0.0–149.0)
VLDL: 47.6 mg/dL — ABNORMAL HIGH (ref 0.0–40.0)

## 2015-04-18 LAB — HEMOGLOBIN A1C: Hgb A1c MFr Bld: 5.5 % (ref 4.6–6.5)

## 2015-04-18 LAB — LDL CHOLESTEROL, DIRECT: Direct LDL: 109 mg/dL

## 2015-04-18 MED ORDER — ZOLPIDEM TARTRATE 10 MG PO TABS: 10.0000 mg | ORAL_TABLET | Freq: Every evening | ORAL | Status: DC | PRN

## 2015-04-18 MED ORDER — HYDROCODONE-ACETAMINOPHEN 7.5-325 MG PO TABS: 1.0000 | ORAL_TABLET | Freq: Four times a day (QID) | ORAL | Status: DC | PRN

## 2015-04-18 NOTE — Progress Notes (Signed)
Patient ID: [name], female   DOB: [birth year], 59 y.o.   MRN: [id number]   Subjective:    Patient ID: [name], female    DOB: 02-27-1956, 59 y.o.   MRN: [id number]  Chief Complaint  Patient presents with  . Annual Exam    HPI Patient is in today for annual exam. She is struggling with great deal of stress with her husband ihving been n the hospital after having a pulmonary embolism and a myocardial infarction earlier this year. She continues to struggle with chronic pain, depression and anxiety. No suicidal ideation. No acute illness but she does note some congestion and ear pressure. No rhinorrhea. Denies CP/palp/SOB/HA/fevers/GI or GU c/o. Taking meds as prescribed  Past Medical History  Diagnosis Date  . Allergy     seasonal  . Anxiety   . Depression   . Hyperlipidemia   . History of proteinuria syndrome   . Edema extremities   . RLS (restless legs syndrome)   . Dyspareunia   . Obese   . Onychomycosis 05/09/2011  . Low back pain 05/09/2011  . RUQ pain 09/20/2011  . Hip pain, left 11/01/2011  . Lymphadenitis 06/16/2012  . Otitis externa 06/23/2012    left  . GERD (gastroesophageal reflux disease)   . Insomnia 03/11/2013  . Perimenopause 03/11/2013  . Acute sinusitis 03/27/2013  . Unspecified constipation 05/13/2013  . Arthritis 06/09/2013  . Urinary frequency 08/26/2013  . Oral lesion 08/26/2013  . Encounter for preventative adult health care exam with abnormal findings 04/21/2014  . Sleep apnea 04/21/2014  . Myalgia and myositis 02/15/2015    Past Surgical History  Procedure Laterality Date  . Cesarean section  1993  . Back surgery  2006    low, discectomy for herniated disc  . Tubal ligation  1993  . Carpal tunnel release Right 2006  . Bladder suspension N/A 11/07/2012    Procedure: [facility];  Surgeon: Malka So, MD;  Location: [facility];  Service: Urology;  Laterality: N/A;  . Cystoscopy N/A 11/07/2012    Procedure: CYSTOSCOPY;   Surgeon: Malka So, MD;  Location: [facility];  Service: Urology;  Laterality: N/A;    Family History  Problem Relation Age of Onset  . Cancer Mother 69    thyroid, malignant brain tumor  . Arthritis Mother     neck, back  . Hepatitis Sister     Hepatitis C  . Heart disease Sister     stent  . Diabetes Maternal Grandmother   . Arthritis Father     back pain  . Heart disease Father     Social History   Social History  . Marital Status: Married    Spouse Name: N/A  . Number of Children: N/A  . Years of Education: N/A   Occupational History  . Not on file.   Social History Main Topics  . Smoking status: Former Smoker -- 0.50 packs/day for 20 years    Types: Cigarettes    Quit date: 07/12/1992  . Smokeless tobacco: Never Used  . Alcohol Use: Yes     Comment: occasionaly  . Drug Use: No  . Sexual Activity:    Partners: Male   Other Topics Concern  . Not on file   Social History Narrative    Outpatient Prescriptions Prior to Visit  Medication Sig Dispense Refill  . albuterol (PROVENTIL HFA;VENTOLIN HFA) 108 (90 BASE) MCG/ACT inhaler Inhale 2 puffs into the lungs every  6 (six) hours as needed for wheezing or shortness of breath. 1 Inhaler 2  . baclofen (LIORESAL) 20 MG tablet   4  . benzonatate (TESSALON) 100 MG capsule Take 1 capsule (100 mg total) by mouth 3 (three) times daily as needed. 21 capsule 0  . BIOTIN PO Take by mouth.    . Calcium Carbonate-Vitamin D (CALTRATE 600+D) 600-400 MG-UNIT per tablet Take 1 tablet by mouth daily.     . Cholecalciferol (VITAMIN D3) 1000 UNITS CAPS Take by mouth.      . conjugated estrogens (PREMARIN) vaginal cream Place vaginally as directed. 3 times a week at bedtime    . diazepam (VALIUM) 10 MG tablet Take 1 tablet (10 mg total) by mouth daily as needed for anxiety. 40 tablet 2  . FLAXSEED, LINSEED, PO Take by mouth every other day.     . fluticasone (FLONASE) 50 MCG/ACT nasal spray Place 2 sprays into both  nostrils daily. 16 g 6  . furosemide (LASIX) 40 MG tablet 2 tabs po qd x 3 days then 1 tab po daily 120 tablet 1  . Gabapentin Enacarbil 600 MG TBCR Take by mouth 2 (two) times daily.    . hydrOXYzine (ATARAX/VISTARIL) 50 MG tablet Take 50 mg by mouth every 6 (six) hours as needed.    . lamoTRIgine (LAMICTAL) 200 MG tablet Take 200 mg by mouth daily.      . metolazone (ZAROXOLYN) 2.5 MG tablet Take 1 tablet (2.5 mg total) by mouth 2 (two) times daily as needed. 30 tablet 2  . Nutritional Supplements (ESTROVEN PO) Take by mouth.    . pantoprazole (PROTONIX) 40 MG tablet Take 1 tablet (40 mg total) by mouth daily. 90 tablet 1  . potassium chloride SA (K-DUR,KLOR-CON) 20 MEQ tablet 2 tabs po qd x 5 days then 1 tab po daily 120 tablet 1  . promethazine (PHENERGAN) 25 MG tablet Take 1 tablet (25 mg total) by mouth every 8 (eight) hours as needed for nausea. 40 tablet 5  . ranitidine (ZANTAC) 300 MG tablet TAKE 1 TABLET BY MOUTH AT BEDTIME 30 tablet 3  . rOPINIRole (REQUIP) 1 MG tablet Take 1 mg by mouth daily.      . simvastatin (ZOCOR) 40 MG tablet Take 1 tablet (40 mg total) by mouth daily. 30 tablet 5  . HYDROcodone-acetaminophen (NORCO) 7.5-325 MG per tablet Take 1 tablet by mouth every 6 (six) hours as needed for moderate pain. 30 tablet 0  . temazepam (RESTORIL) 15 MG capsule Take 1 capsule (15 mg total) by mouth at bedtime as needed for sleep. 30 capsule 5  . buPROPion (WELLBUTRIN SR) 150 MG 12 hr tablet Take 150 mg by mouth daily.    Marland Kitchen levofloxacin (LEVAQUIN) 500 MG tablet Take 1 tablet (500 mg total) by mouth daily. 7 tablet 0   No facility-administered medications prior to visit.    Allergies  Allergen Reactions  . Carbamazepine Hives  . Ceclor [Cefaclor]     Tongue swell  . Morphine And Related Itching  . Atorvastatin Other (See Comments)    Leg pain    Review of Systems  Constitutional: Negative for fever and malaise/fatigue.  HENT: Positive for congestion.   Eyes: Negative  for discharge.  Respiratory: Negative for shortness of breath.   Cardiovascular: Negative for chest pain, palpitations and leg swelling.  Gastrointestinal: Positive for heartburn. Negative for nausea and abdominal pain.  Genitourinary: Negative for dysuria.  Musculoskeletal: Negative for falls.  Skin: Negative for rash.  Neurological: Negative for loss of consciousness and headaches.  Endo/Heme/Allergies: Negative for environmental allergies.  Psychiatric/Behavioral: Positive for depression. The patient is nervous/anxious.        Objective:    Physical Exam  Constitutional: She is oriented to person, place, and time. She appears well-developed and well-nourished. No distress.  HENT:  Head: Normocephalic and atraumatic.  Nose: Nose normal.  Eyes: Right eye exhibits no discharge. Left eye exhibits no discharge.  Neck: Normal range of motion. Neck supple.  Cardiovascular: Normal rate and regular rhythm.   No murmur heard. Pulmonary/Chest: Effort normal and breath sounds normal.  Abdominal: Soft. Bowel sounds are normal. There is no tenderness.  Musculoskeletal: She exhibits no edema.  Neurological: She is alert and oriented to person, place, and time.  Skin: Skin is warm and dry.  Psychiatric: She has a normal mood and affect.  Nursing note and vitals reviewed.   BP 91/55 mmHg  Pulse 73  Temp(Src) 98.1 F (36.7 C) (Oral)  Ht 5\' 5"  (1.651 m)  Wt 200 lb 4 oz (90.833 kg)  BMI 33.32 kg/m2  SpO2 98%  LMP 10/03/2012 Wt Readings from Last 3 Encounters:  04/18/15 200 lb 4 oz (90.833 kg)  02/13/15 206 lb 9.6 oz (93.713 kg)  01/30/15 203 lb 9.6 oz (92.352 kg)     Lab Results  Component Value Date   WBC 6.1 04/18/2015   HGB 14.1 04/18/2015   HCT 43.0 04/18/2015   PLT 275.0 04/18/2015   GLUCOSE 88 04/18/2015   CHOL 200 04/18/2015   TRIG 238.0* 04/18/2015   HDL 42.00 04/18/2015   LDLDIRECT 109.0 04/18/2015   LDLCALC 117* 04/18/2014   ALT 18 04/18/2015   AST 16 04/18/2015    NA 144 04/18/2015   K 4.7 04/18/2015   CL 108 04/18/2015   CREATININE 0.87 04/18/2015   BUN 11 04/18/2015   CO2 30 04/18/2015   TSH 1.38 04/18/2015   HGBA1C 5.5 04/18/2015    Lab Results  Component Value Date   TSH 1.38 04/18/2015   Lab Results  Component Value Date   WBC 6.1 04/18/2015   HGB 14.1 04/18/2015   HCT 43.0 04/18/2015   MCV 84.4 04/18/2015   PLT 275.0 04/18/2015   Lab Results  Component Value Date   NA 144 04/18/2015   K 4.7 04/18/2015   CO2 30 04/18/2015   GLUCOSE 88 04/18/2015   BUN 11 04/18/2015   CREATININE 0.87 04/18/2015   BILITOT 0.7 04/18/2015   ALKPHOS 68 04/18/2015   AST 16 04/18/2015   ALT 18 04/18/2015   PROT 6.8 04/18/2015   ALBUMIN 4.2 04/18/2015   CALCIUM 9.6 04/18/2015   ANIONGAP 13 01/22/2015   GFR 71.26 04/18/2015   Lab Results  Component Value Date   CHOL 200 04/18/2015   Lab Results  Component Value Date   HDL 42.00 04/18/2015   Lab Results  Component Value Date   LDLCALC 117* 04/18/2014   Lab Results  Component Value Date   TRIG 238.0* 04/18/2015   Lab Results  Component Value Date   CHOLHDL 5 04/18/2015   Lab Results  Component Value Date   HGBA1C 5.5 04/18/2015       Assessment & Plan:     I have discontinued Ms. Ault's buPROPion, temazepam, and levofloxacin. I have also changed her HYDROcodone-acetaminophen. Additionally, I am having her start on zolpidem. Lastly, I am having her maintain her lamoTRIgine, rOPINIRole, Calcium Carbonate-Vitamin D, (FLAXSEED, LINSEED, PO), Vitamin D3, BIOTIN PO, Nutritional Supplements (ESTROVEN  PO), conjugated estrogens, hydrOXYzine, Gabapentin Enacarbil, metolazone, diazepam, promethazine, pantoprazole, ranitidine, albuterol, fluticasone, baclofen, furosemide, potassium chloride SA, benzonatate, and simvastatin.  Meds ordered this encounter  Medications  . HYDROcodone-acetaminophen (NORCO) 7.5-325 MG tablet    Sig: Take 1 tablet by mouth every 6 (six) hours as needed  for moderate pain.    Dispense:  30 tablet    Refill:  0  . zolpidem (AMBIEN) 10 MG tablet    Sig: Take 1 tablet (10 mg total) by mouth at bedtime as needed for sleep.    Dispense:  15 tablet    Refill:  0     Penni Homans, MD

## 2015-04-18 NOTE — Patient Instructions (Addendum)
Place a few drops of peroxide in ear and let it wash out when having an itching ear. Restart gabapentin for pins and needles in the feet Melatonin 2-10mg at nights to help you sleep Probiotic with 10 strains sold by [employer] can be found online or in [place] for Adults, Female A healthy lifestyle and preventive care can promote health and wellness. Preventive health guidelines for women include the following key practices.  A routine yearly physical is a good way to check with your health care provider about your health and preventive screening. It is a chance to share any concerns and updates on your health and to receive a thorough exam.  Visit your dentist for a routine exam and preventive care every 6 months. Brush your teeth twice a day and floss once a day. Good oral hygiene prevents tooth decay and gum disease.  The frequency of eye exams is based on your age, health, family medical history, use of contact lenses, and other factors. Follow your health care provider's recommendations for frequency of eye exams.  Eat a healthy diet. Foods like vegetables, fruits, whole grains, low-fat dairy products, and lean protein foods contain the nutrients you need without too many calories. Decrease your intake of foods high in solid fats, added sugars, and salt. Eat the right amount of calories for you.Get information about a proper diet from your health care provider, if necessary.  Regular physical exercise is one of the most important things you can do for your health. Most adults should get at least 150 minutes of moderate-intensity exercise (any activity that increases your heart rate and causes you to sweat) each week. In addition, most adults need muscle-strengthening exercises on 2 or more days a week.  Maintain a healthy weight. The body mass index (BMI) is a screening tool to identify possible weight problems. It provides an estimate of body fat based on height and weight.  Your health care provider can find your BMI and can help you achieve or maintain a healthy weight.For adults 20 years and older:  A BMI below 18.5 is considered underweight.  A BMI of 18.5 to 24.9 is normal.  A BMI of 25 to 29.9 is considered overweight.  A BMI of 30 and above is considered obese.  Maintain normal blood lipids and cholesterol levels by exercising and minimizing your intake of saturated fat. Eat a balanced diet with plenty of fruit and vegetables. Blood tests for lipids and cholesterol should begin at age 39 and be repeated every 5 years. If your lipid or cholesterol levels are high, you are over 50, or you are at high risk for heart disease, you may need your cholesterol levels checked more frequently.Ongoing high lipid and cholesterol levels should be treated with medicines if diet and exercise are not working.  If you smoke, find out from your health care provider how to quit. If you do not use tobacco, do not start.  Lung cancer screening is recommended for adults aged 59-80 years who are at high risk for developing lung cancer because of a history of smoking. A yearly low-dose CT scan of the lungs is recommended for people who have at least a 30-pack-year history of smoking and are a current smoker or have quit within the past 15 years. A pack year of smoking is smoking an average of 1 pack of cigarettes a day for 1 year (for example: 1 pack a day for 30 years or 2 packs a day  for 15 years). Yearly screening should continue until the smoker has stopped smoking for at least 15 years. Yearly screening should be stopped for people who develop a health problem that would prevent them from having lung cancer treatment.  If you are pregnant, do not drink alcohol. If you are breastfeeding, be very cautious about drinking alcohol. If you are not pregnant and choose to drink alcohol, do not have more than 1 drink per day. One drink is considered to be 12 ounces (355 mL) of beer, 5  ounces (148 mL) of wine, or 1.5 ounces (44 mL) of liquor.  Avoid use of street drugs. Do not share needles with anyone. Ask for help if you need support or instructions about stopping the use of drugs.  High blood pressure causes heart disease and increases the risk of stroke. Your blood pressure should be checked at least every 1 to 2 years. Ongoing high blood pressure should be treated with medicines if weight loss and exercise do not work.  If you are 54-26 years old, ask your health care provider if you should take aspirin to prevent strokes.  Diabetes screening is done by taking a blood sample to check your blood glucose level after you have not eaten for a certain period of time (fasting). If you are not overweight and you do not have risk factors for diabetes, you should be screened once every 3 years starting at age 66. If you are overweight or obese and you are 28-63 years of age, you should be screened for diabetes every year as part of your cardiovascular risk assessment.  Breast cancer screening is essential preventive care for women. You should practice "breast self-awareness." This means understanding the normal appearance and feel of your breasts and may include breast self-examination. Any changes detected, no matter how small, should be reported to a health care provider. Women in their 2s and 30s should have a clinical breast exam (CBE) by a health care provider as part of a regular health exam every 1 to 3 years. After age 21, women should have a CBE every year. Starting at age 33, women should consider having a mammogram (breast X-ray test) every year. Women who have a family history of breast cancer should talk to their health care provider about genetic screening. Women at a high risk of breast cancer should talk to their health care providers about having an MRI and a mammogram every year.  Breast cancer gene (BRCA)-related cancer risk assessment is recommended for women who have  family members with BRCA-related cancers. BRCA-related cancers include breast, ovarian, tubal, and peritoneal cancers. Having family members with these cancers may be associated with an increased risk for harmful changes (mutations) in the breast cancer genes BRCA1 and BRCA2. Results of the assessment will determine the need for genetic counseling and BRCA1 and BRCA2 testing.  Your health care provider may recommend that you be screened regularly for cancer of the pelvic organs (ovaries, uterus, and vagina). This screening involves a pelvic examination, including checking for microscopic changes to the surface of your cervix (Pap test). You may be encouraged to have this screening done every 3 years, beginning at age 66.  For women ages 35-65, health care providers may recommend pelvic exams and Pap testing every 3 years, or they may recommend the Pap and pelvic exam, combined with testing for human papilloma virus (HPV), every 5 years. Some types of HPV increase your risk of cervical cancer. Testing for HPV may also be  done on women of any age with unclear Pap test results.  Other health care providers may not recommend any screening for nonpregnant women who are considered low risk for pelvic cancer and who do not have symptoms. Ask your health care provider if a screening pelvic exam is right for you.  If you have had past treatment for cervical cancer or a condition that could lead to cancer, you need Pap tests and screening for cancer for at least 20 years after your treatment. If Pap tests have been discontinued, your risk factors (such as having a new sexual partner) need to be reassessed to determine if screening should resume. Some women have medical problems that increase the chance of getting cervical cancer. In these cases, your health care provider may recommend more frequent screening and Pap tests.  Colorectal cancer can be detected and often prevented. Most routine colorectal cancer  screening begins at the age of 39 years and continues through age 37 years. However, your health care provider may recommend screening at an earlier age if you have risk factors for colon cancer. On a yearly basis, your health care provider may provide home test kits to check for hidden blood in the stool. Use of a small camera at the end of a tube, to directly examine the colon (sigmoidoscopy or colonoscopy), can detect the earliest forms of colorectal cancer. Talk to your health care provider about this at age 65, when routine screening begins. Direct exam of the colon should be repeated every 5-10 years through age 10 years, unless early forms of precancerous polyps or small growths are found.  People who are at an increased risk for hepatitis B should be screened for this virus. You are considered at high risk for hepatitis B if:  You were born in a country where hepatitis B occurs often. Talk with your health care provider about which countries are considered high risk.  Your parents were born in a high-risk country and you have not received a shot to protect against hepatitis B (hepatitis B vaccine).  You have HIV or AIDS.  You use needles to inject street drugs.  You live with, or have sex with, someone who has hepatitis B.  You get hemodialysis treatment.  You take certain medicines for conditions like cancer, organ transplantation, and autoimmune conditions.  Hepatitis C blood testing is recommended for all people born from [birth year] through 1965 and any individual with known risks for hepatitis C.  Practice safe sex. Use condoms and avoid high-risk sexual practices to reduce the spread of sexually transmitted infections (STIs). STIs include gonorrhea, chlamydia, syphilis, trichomonas, herpes, HPV, and human immunodeficiency virus (HIV). Herpes, HIV, and HPV are viral illnesses that have no cure. They can result in disability, cancer, and death.  You should be screened for sexually  transmitted illnesses (STIs) including gonorrhea and chlamydia if:  You are sexually active and are younger than 24 years.  You are older than 24 years and your health care provider tells you that you are at risk for this type of infection.  Your sexual activity has changed since you were last screened and you are at an increased risk for chlamydia or gonorrhea. Ask your health care provider if you are at risk.  If you are at risk of being infected with HIV, it is recommended that you take a prescription medicine daily to prevent HIV infection. This is called preexposure prophylaxis (PrEP). You are considered at risk if:  You are sexually active  and do not regularly use condoms or know the HIV status of your partner(s).  You take drugs by injection.  You are sexually active with a partner who has HIV.  Talk with your health care provider about whether you are at high risk of being infected with HIV. If you choose to begin PrEP, you should first be tested for HIV. You should then be tested every 3 months for as long as you are taking PrEP.  Osteoporosis is a disease in which the bones lose minerals and strength with aging. This can result in serious bone fractures or breaks. The risk of osteoporosis can be identified using a bone density scan. Women ages 78 years and over and women at risk for fractures or osteoporosis should discuss screening with their health care providers. Ask your health care provider whether you should take a calcium supplement or vitamin D to reduce the rate of osteoporosis.  Menopause can be associated with physical symptoms and risks. Hormone replacement therapy is available to decrease symptoms and risks. You should talk to your health care provider about whether hormone replacement therapy is right for you.  Use sunscreen. Apply sunscreen liberally and repeatedly throughout the day. You should seek shade when your shadow is shorter than you. Protect yourself by  wearing long sleeves, pants, a wide-brimmed hat, and sunglasses year round, whenever you are outdoors.  Once a month, do a whole body skin exam, using a mirror to look at the skin on your back. Tell your health care provider of new moles, moles that have irregular borders, moles that are larger than a pencil eraser, or moles that have changed in shape or color.  Stay current with required vaccines (immunizations).  Influenza vaccine. All adults should be immunized every year.  Tetanus, diphtheria, and acellular pertussis (Td, Tdap) vaccine. Pregnant women should receive 1 dose of Tdap vaccine during each pregnancy. The dose should be obtained regardless of the length of time since the last dose. Immunization is preferred during the 27th-36th week of gestation. An adult who has not previously received Tdap or who does not know her vaccine status should receive 1 dose of Tdap. This initial dose should be followed by tetanus and diphtheria toxoids (Td) booster doses every 10 years. Adults with an unknown or incomplete history of completing a 3-dose immunization series with Td-containing vaccines should begin or complete a primary immunization series including a Tdap dose. Adults should receive a Td booster every 10 years.  Varicella vaccine. An adult without evidence of immunity to varicella should receive 2 doses or a second dose if she has previously received 1 dose. Pregnant females who do not have evidence of immunity should receive the first dose after pregnancy. This first dose should be obtained before leaving the health care facility. The second dose should be obtained 4-8 weeks after the first dose.  Human papillomavirus (HPV) vaccine. Females aged 13-26 years who have not received the vaccine previously should obtain the 3-dose series. The vaccine is not recommended for use in pregnant females. However, pregnancy testing is not needed before receiving a dose. If a female is found to be pregnant  after receiving a dose, no treatment is needed. In that case, the remaining doses should be delayed until after the pregnancy. Immunization is recommended for any person with an immunocompromised condition through the age of 50 years if she did not get any or all doses earlier. During the 3-dose series, the second dose should be obtained 4-8 weeks  after the first dose. The third dose should be obtained 24 weeks after the first dose and 16 weeks after the second dose.  Zoster vaccine. One dose is recommended for adults aged 7 years or older unless certain conditions are present.  Measles, mumps, and rubella (MMR) vaccine. Adults born before [birth year] generally are considered immune to measles and mumps. Adults born in [birth year] or later should have 1 or more doses of MMR vaccine unless there is a contraindication to the vaccine or there is laboratory evidence of immunity to each of the three diseases. A routine second dose of MMR vaccine should be obtained at least 28 days after the first dose for students attending postsecondary schools, health care workers, or international travelers. People who received inactivated measles vaccine or an unknown type of measles vaccine during 1963-1967 should receive 2 doses of MMR vaccine. People who received inactivated mumps vaccine or an unknown type of mumps vaccine before 1979 and are at high risk for mumps infection should consider immunization with 2 doses of MMR vaccine. For females of childbearing age, rubella immunity should be determined. If there is no evidence of immunity, females who are not pregnant should be vaccinated. If there is no evidence of immunity, females who are pregnant should delay immunization until after pregnancy. Unvaccinated health care workers born before [birth year] who lack laboratory evidence of measles, mumps, or rubella immunity or laboratory confirmation of disease should consider measles and mumps immunization with 2 doses of MMR vaccine or rubella  immunization with 1 dose of MMR vaccine.  Pneumococcal 13-valent conjugate (PCV13) vaccine. When indicated, a person who is uncertain of his immunization history and has no record of immunization should receive the PCV13 vaccine. All adults 55 years of age and older should receive this vaccine. An adult aged 38 years or older who has certain medical conditions and has not been previously immunized should receive 1 dose of PCV13 vaccine. This PCV13 should be followed with a dose of pneumococcal polysaccharide (PPSV23) vaccine. Adults who are at high risk for pneumococcal disease should obtain the PPSV23 vaccine at least 8 weeks after the dose of PCV13 vaccine. Adults older than 57 years of age who have normal immune system function should obtain the PPSV23 vaccine dose at least 1 year after the dose of PCV13 vaccine.  Pneumococcal polysaccharide (PPSV23) vaccine. When PCV13 is also indicated, PCV13 should be obtained first. All adults aged 66 years and older should be immunized. An adult younger than age 45 years who has certain medical conditions should be immunized. Any person who resides in a nursing home or long-term care facility should be immunized. An adult smoker should be immunized. People with an immunocompromised condition and certain other conditions should receive both PCV13 and PPSV23 vaccines. People with human immunodeficiency virus (HIV) infection should be immunized as soon as possible after diagnosis. Immunization during chemotherapy or radiation therapy should be avoided. Routine use of PPSV23 vaccine is not recommended for American Indians, [place] Natives, or people younger than 65 years unless there are medical conditions that require PPSV23 vaccine. When indicated, people who have unknown immunization and have no record of immunization should receive PPSV23 vaccine. One-time revaccination 5 years after the first dose of PPSV23 is recommended for people aged 19-64 years who have chronic  kidney failure, nephrotic syndrome, asplenia, or immunocompromised conditions. People who received 1-2 doses of PPSV23 before age 71 years should receive another dose of PPSV23 vaccine at age 50 years or later if at least  5 years have passed since the previous dose. Doses of PPSV23 are not needed for people immunized with PPSV23 at or after age 67 years.  Meningococcal vaccine. Adults with asplenia or persistent complement component deficiencies should receive 2 doses of quadrivalent meningococcal conjugate (MenACWY-D) vaccine. The doses should be obtained at least 2 months apart. Microbiologists working with certain meningococcal bacteria, [employer] recruits, people at risk during an outbreak, and people who travel to or live in countries with a high rate of meningitis should be immunized. A first-year college student up through age 96 years who is living in a residence hall should receive a dose if she did not receive a dose on or after her 16th birthday. Adults who have certain high-risk conditions should receive one or more doses of vaccine.  Hepatitis A vaccine. Adults who wish to be protected from this disease, have certain high-risk conditions, work with hepatitis A-infected animals, work in hepatitis A research labs, or travel to or work in countries with a high rate of hepatitis A should be immunized. Adults who were previously unvaccinated and who anticipate close contact with an international adoptee during the first 60 days after arrival in the [place] from a country with a high rate of hepatitis A should be immunized.  Hepatitis B vaccine. Adults who wish to be protected from this disease, have certain high-risk conditions, may be exposed to blood or other infectious body fluids, are household contacts or sex partners of hepatitis B positive people, are clients or workers in certain care facilities, or travel to or work in countries with a high rate of hepatitis B should be  immunized.  Haemophilus influenzae type b (Hib) vaccine. A previously unvaccinated person with asplenia or sickle cell disease or having a scheduled splenectomy should receive 1 dose of Hib vaccine. Regardless of previous immunization, a recipient of a hematopoietic stem cell transplant should receive a 3-dose series 6-12 months after her successful transplant. Hib vaccine is not recommended for adults with HIV infection. Preventive Services / Frequency Ages 54 to 23 years  Blood pressure check.** / Every 3-5 years.  Lipid and cholesterol check.** / Every 5 years beginning at age 105.  Clinical breast exam.** / Every 3 years for women in their 66s and 60s.  BRCA-related cancer risk assessment.** / For women who have family members with a BRCA-related cancer (breast, ovarian, tubal, or peritoneal cancers).  Pap test.** / Every 2 years from ages 68 through 41. Every 3 years starting at age 56 through age 25 or 43 with a history of 3 consecutive normal Pap tests.  HPV screening.** / Every 3 years from ages 57 through ages 40 to 12 with a history of 3 consecutive normal Pap tests.  Hepatitis C blood test.** / For any individual with known risks for hepatitis C.  Skin self-exam. / Monthly.  Influenza vaccine. / Every year.  Tetanus, diphtheria, and acellular pertussis (Tdap, Td) vaccine.** / Consult your health care provider. Pregnant women should receive 1 dose of Tdap vaccine during each pregnancy. 1 dose of Td every 10 years.  Varicella vaccine.** / Consult your health care provider. Pregnant females who do not have evidence of immunity should receive the first dose after pregnancy.  HPV vaccine. / 3 doses over 6 months, if 28 and younger. The vaccine is not recommended for use in pregnant females. However, pregnancy testing is not needed before receiving a dose.  Measles, mumps, rubella (MMR) vaccine.** / You need at least 1 dose of  MMR if you were born in 1957 or later. You may also need  a 2nd dose. For females of childbearing age, rubella immunity should be determined. If there is no evidence of immunity, females who are not pregnant should be vaccinated. If there is no evidence of immunity, females who are pregnant should delay immunization until after pregnancy.  Pneumococcal 13-valent conjugate (PCV13) vaccine.** / Consult your health care provider.  Pneumococcal polysaccharide (PPSV23) vaccine.** / 1 to 2 doses if you smoke cigarettes or if you have certain conditions.  Meningococcal vaccine.** / 1 dose if you are age 75 to 29 years and a Market researcher living in a residence hall, or have one of several medical conditions, you need to get vaccinated against meningococcal disease. You may also need additional booster doses.  Hepatitis A vaccine.** / Consult your health care provider.  Hepatitis B vaccine.** / Consult your health care provider.  Haemophilus influenzae type b (Hib) vaccine.** / Consult your health care provider. Ages 5 to 56 years  Blood pressure check.** / Every year.  Lipid and cholesterol check.** / Every 5 years beginning at age 62 years.  Lung cancer screening. / Every year if you are aged 45-80 years and have a 30-pack-year history of smoking and currently smoke or have quit within the past 15 years. Yearly screening is stopped once you have quit smoking for at least 15 years or develop a health problem that would prevent you from having lung cancer treatment.  Clinical breast exam.** / Every year after age 23 years.  BRCA-related cancer risk assessment.** / For women who have family members with a BRCA-related cancer (breast, ovarian, tubal, or peritoneal cancers).  Mammogram.** / Every year beginning at age 25 years and continuing for as long as you are in good health. Consult with your health care provider.  Pap test.** / Every 3 years starting at age 73 years through age 76 or 34 years with a history of 3 consecutive normal Pap  tests.  HPV screening.** / Every 3 years from ages 48 years through ages 27 to 57 years with a history of 3 consecutive normal Pap tests.  Fecal occult blood test (FOBT) of stool. / Every year beginning at age 96 years and continuing until age 46 years. You may not need to do this test if you get a colonoscopy every 10 years.  Flexible sigmoidoscopy or colonoscopy.** / Every 5 years for a flexible sigmoidoscopy or every 10 years for a colonoscopy beginning at age 87 years and continuing until age 38 years.  Hepatitis C blood test.** / For all people born from [birth year] through 1963 and any individual with known risks for hepatitis C.  Skin self-exam. / Monthly.  Influenza vaccine. / Every year.  Tetanus, diphtheria, and acellular pertussis (Tdap/Td) vaccine.** / Consult your health care provider. Pregnant women should receive 1 dose of Tdap vaccine during each pregnancy. 1 dose of Td every 10 years.  Varicella vaccine.** / Consult your health care provider. Pregnant females who do not have evidence of immunity should receive the first dose after pregnancy.  Zoster vaccine.** / 1 dose for adults aged 37 years or older.  Measles, mumps, rubella (MMR) vaccine.** / You need at least 1 dose of MMR if you were born in 1957 or later. You may also need a second dose. For females of childbearing age, rubella immunity should be determined. If there is no evidence of immunity, females who are not pregnant should be vaccinated. If there  is no evidence of immunity, females who are pregnant should delay immunization until after pregnancy.  Pneumococcal 13-valent conjugate (PCV13) vaccine.** / Consult your health care provider.  Pneumococcal polysaccharide (PPSV23) vaccine.** / 1 to 2 doses if you smoke cigarettes or if you have certain conditions.  Meningococcal vaccine.** / Consult your health care provider.  Hepatitis A vaccine.** / Consult your health care provider.  Hepatitis B vaccine.** / Consult  your health care provider.  Haemophilus influenzae type b (Hib) vaccine.** / Consult your health care provider. Ages 12 years and over  Blood pressure check.** / Every year.  Lipid and cholesterol check.** / Every 5 years beginning at age 59 years.  Lung cancer screening. / Every year if you are aged 5-80 years and have a 30-pack-year history of smoking and currently smoke or have quit within the past 15 years. Yearly screening is stopped once you have quit smoking for at least 15 years or develop a health problem that would prevent you from having lung cancer treatment.  Clinical breast exam.** / Every year after age 74 years.  BRCA-related cancer risk assessment.** / For women who have family members with a BRCA-related cancer (breast, ovarian, tubal, or peritoneal cancers).  Mammogram.** / Every year beginning at age 63 years and continuing for as long as you are in good health. Consult with your health care provider.  Pap test.** / Every 3 years starting at age 17 years through age 73 or 42 years with 3 consecutive normal Pap tests. Testing can be stopped between 65 and 70 years with 3 consecutive normal Pap tests and no abnormal Pap or HPV tests in the past 10 years.  HPV screening.** / Every 3 years from ages 89 years through ages 60 or 86 years with a history of 3 consecutive normal Pap tests. Testing can be stopped between 65 and 70 years with 3 consecutive normal Pap tests and no abnormal Pap or HPV tests in the past 10 years.  Fecal occult blood test (FOBT) of stool. / Every year beginning at age 7 years and continuing until age 35 years. You may not need to do this test if you get a colonoscopy every 10 years.  Flexible sigmoidoscopy or colonoscopy.** / Every 5 years for a flexible sigmoidoscopy or every 10 years for a colonoscopy beginning at age 15 years and continuing until age 5 years.  Hepatitis C blood test.** / For all people born from [birth year] through 1963 and any  individual with known risks for hepatitis C.  Osteoporosis screening.** / A one-time screening for women ages 39 years and over and women at risk for fractures or osteoporosis.  Skin self-exam. / Monthly.  Influenza vaccine. / Every year.  Tetanus, diphtheria, and acellular pertussis (Tdap/Td) vaccine.** / 1 dose of Td every 10 years.  Varicella vaccine.** / Consult your health care provider.  Zoster vaccine.** / 1 dose for adults aged 93 years or older.  Pneumococcal 13-valent conjugate (PCV13) vaccine.** / Consult your health care provider.  Pneumococcal polysaccharide (PPSV23) vaccine.** / 1 dose for all adults aged 50 years and older.  Meningococcal vaccine.** / Consult your health care provider.  Hepatitis A vaccine.** / Consult your health care provider.  Hepatitis B vaccine.** / Consult your health care provider.  Haemophilus influenzae type b (Hib) vaccine.** / Consult your health care provider. ** Family history and personal history of risk and conditions may change your health care provider's recommendations.   This information is not intended to replace advice  given to you by your health care provider. Make sure you discuss any questions you have with your health care provider.   Document Released: 08/24/2001 Document Revised: 07/19/2014 Document Reviewed: 11/23/2010 Elsevier Interactive Patient Education [employer].

## 2015-04-18 NOTE — Assessment & Plan Note (Signed)
Husband in hospital unable to sleep, has not slept well in 3 weeks. Try Melatonin or L Tryptophan, has some results from simply sleep and is allowed a small amount of Ambien

## 2015-04-18 NOTE — Progress Notes (Signed)
Pre visit review using our clinic review tool, if applicable. No additional management support is needed unless otherwise documented below in the visit note. 

## 2015-04-18 NOTE — Assessment & Plan Note (Addendum)
Encouraged heart healthy diet, increase exercise, avoid trans fats, consider a krill oil cap daily. Tolerating Simvastatin.  

## 2015-04-19 LAB — HEPATITIS C ANTIBODY: HCV Ab: NEGATIVE

## 2015-04-26 DIAGNOSIS — I959 Hypotension, unspecified: Secondary | ICD-10-CM | POA: Insufficient documentation

## 2015-04-26 DIAGNOSIS — R5383 Other fatigue: Secondary | ICD-10-CM | POA: Insufficient documentation

## 2015-04-26 DIAGNOSIS — Z780 Asymptomatic menopausal state: Secondary | ICD-10-CM | POA: Insufficient documentation

## 2015-04-26 DIAGNOSIS — R739 Hyperglycemia, unspecified: Secondary | ICD-10-CM | POA: Insufficient documentation

## 2015-04-26 NOTE — Assessment & Plan Note (Signed)
Diverticulosis treated recently. Encouraged to add probiotics and maintain a bland diet.

## 2015-04-26 NOTE — Assessment & Plan Note (Signed)
Encouraged DASH diet, decrease po intake and increase exercise as tolerated. Needs 7-8 hours of sleep nightly. Avoid trans fats, eat small, frequent meals every 4-5 hours with lean proteins, complex carbs and healthy fats. Minimize simple carbs, GMO foods. 

## 2015-04-26 NOTE — Assessment & Plan Note (Signed)
Avoid offending foods, start probiotics. Do not eat large meals in late evening and consider raising head of bed.  

## 2015-04-26 NOTE — Assessment & Plan Note (Addendum)
Patient encouraged to maintain heart healthy diet, regular exercise, adequate sleep. Consider daily probiotics. Take medications as prescribed. Follows with GYN for her care

## 2015-04-26 NOTE — Assessment & Plan Note (Signed)
minimize simple carbs. Increase exercise as tolerated.  

## 2015-05-21 ENCOUNTER — Other Ambulatory Visit: Payer: Self-pay | Admitting: Family Medicine

## 2015-07-25 ENCOUNTER — Encounter: Payer: BLUE CROSS/BLUE SHIELD | Admitting: Family Medicine

## 2015-07-28 ENCOUNTER — Encounter: Payer: BLUE CROSS/BLUE SHIELD | Admitting: Family Medicine

## 2015-07-29 MED FILL — SIMVASTATIN 40 MG TABLET: 40 | 30 days supply | Qty: 30 | Fill #5

## 2015-08-06 MED FILL — raNITIdine HCL 300 MG TABS: 300 | 30 days supply | Qty: 30 | Fill #2

## 2015-08-06 MED FILL — HYDROXYZINE PAM 50 MG CAP: 50 | 8 days supply | Qty: 60 | Fill #0

## 2015-08-06 MED FILL — lamoTRIgine 150 MG TABS: 150 | 30 days supply | Qty: 60 | Fill #2

## 2015-08-14 ENCOUNTER — Other Ambulatory Visit: Payer: Self-pay | Admitting: Family Medicine

## 2015-08-14 DIAGNOSIS — Z79899 Other long term (current) drug therapy: Secondary | ICD-10-CM | POA: Diagnosis not present

## 2015-08-14 DIAGNOSIS — G4700 Insomnia, unspecified: Secondary | ICD-10-CM

## 2015-08-14 MED ORDER — DIAZEPAM 10 MG PO TABS: 10.0000 mg | ORAL_TABLET | Freq: Every day | ORAL | Status: DC | PRN

## 2015-08-14 MED FILL — diazePAM 5 MG TABS: 5 | 40 days supply | Qty: 40 | Fill #0

## 2015-08-14 NOTE — Telephone Encounter (Signed)
Printed prescription and on counter for signature. Called the patient informed to pickup hardcopy at the front desk, but must do a UDS before getting hardcopy.  The patient did understand/agreed to all instructions.

## 2015-08-14 NOTE — Telephone Encounter (Signed)
OK to refill 30 days supply with 1 rf but needs a drug screen

## 2015-08-14 NOTE — Telephone Encounter (Signed)
Requesting:  Diazepam Contract  04/28/2014 UDS  07/29/2013  High Risk Last OV   04/18/2015 Last Refill   #40 with 2 refills 12/10/2014  Please Advise

## 2015-08-18 ENCOUNTER — Telehealth: Payer: Self-pay | Admitting: Family Medicine

## 2015-08-18 ENCOUNTER — Other Ambulatory Visit: Payer: Self-pay | Admitting: Family Medicine

## 2015-08-18 ENCOUNTER — Other Ambulatory Visit: Payer: Self-pay

## 2015-08-18 DIAGNOSIS — R5381 Other malaise: Secondary | ICD-10-CM

## 2015-08-18 DIAGNOSIS — Z1231 Encounter for screening mammogram for malignant neoplasm of breast: Secondary | ICD-10-CM

## 2015-08-18 DIAGNOSIS — N6310 Unspecified lump in the right breast, unspecified quadrant: Secondary | ICD-10-CM

## 2015-08-18 NOTE — Telephone Encounter (Signed)
Caller name:  Cieara  Relationship to patient: Self  Can be reached: [phone]   Reason for call: pt says that she found a lump in her right breast. She called over to the breast center and was told to have PCP fax over a order for a diagnostic mammogram and ultrasound for right breast.    [facility] on [address] [phone]

## 2015-08-20 ENCOUNTER — Ambulatory Visit (INDEPENDENT_AMBULATORY_CARE_PROVIDER_SITE_OTHER): Payer: Medicare Other | Admitting: Medical

## 2015-08-20 ENCOUNTER — Other Ambulatory Visit: Payer: Self-pay | Admitting: Family Medicine

## 2015-08-20 ENCOUNTER — Encounter: Payer: Self-pay | Admitting: Medical

## 2015-08-20 VITALS — BP 118/72 | HR 91 | Temp 97.7°F | Ht 65.0 in | Wt 184.6 lb

## 2015-08-20 DIAGNOSIS — J0100 Acute maxillary sinusitis, unspecified: Secondary | ICD-10-CM | POA: Diagnosis not present

## 2015-08-20 DIAGNOSIS — N63 Unspecified lump in unspecified breast: Secondary | ICD-10-CM

## 2015-08-20 DIAGNOSIS — N6310 Unspecified lump in the right breast, unspecified quadrant: Secondary | ICD-10-CM

## 2015-08-20 DIAGNOSIS — N649 Disorder of breast, unspecified: Secondary | ICD-10-CM

## 2015-08-20 MED ORDER — AZITHROMYCIN 250 MG PO TABS: ORAL_TABLET | ORAL | Status: DC

## 2015-08-20 MED ORDER — BENZONATATE 100 MG PO CAPS: 100.0000 mg | ORAL_CAPSULE | Freq: Three times a day (TID) | ORAL | Status: DC | PRN

## 2015-08-20 MED FILL — BENZONATATE 100 MG CAPSULE: 100 | 7 days supply | Qty: 21 | Fill #0

## 2015-08-20 MED FILL — AZITHROMYCIN 250 MG TABLET: 250 | 5 days supply | Qty: 6 | Fill #0

## 2015-08-20 NOTE — Progress Notes (Signed)
Subjective:    Patient ID: [name], female    DOB: Mar 31, 1956, 60 y.o.   MRN: [id number]  HPI  Pt states felt lump on rt side of her breast 2 days ago.. Noticed this rolling over in bed. She felt mild pain on rt side of her breast. She started to palate area  and felt lump. Pt states 10 years ago had to get [place]. She was told cyst in the past. This cyst was on the same side. Pt sent message to Dr. Randel Pigg. Imaging studies ordered but radiology wanted some info on location of lump/mass before studies could be done.  Pt last mammogram done last year. Was normal.  Pt states aunt on mothers side had breast cancer. But no breast CA in mom or sisters.   Pt also states at the end of exam she has had  sinus pressure, nasal congestion,some sneezing. Some ear pain. Cough. No wheezing.(symtpoms for 5 months). Her husband was sick and in hospital. So she neglected these symptoms.  No leg pain. No popliteal.    Review of Systems  Constitutional: Positive for fatigue. Negative for fever and chills.  HENT: Positive for congestion, ear pain, sinus pressure and sneezing.   Respiratory: Positive for cough. Negative for choking, shortness of breath and wheezing.   Cardiovascular: Negative for chest pain and palpitations.  Gastrointestinal: Negative for nausea, vomiting and diarrhea.  Musculoskeletal: Negative for back pain.  Skin: Negative for rash.  Hematological: Negative for adenopathy. Does not bruise/bleed easily.    Past Medical History  Diagnosis Date  . Allergy     seasonal  . Anxiety   . Depression   . Hyperlipidemia   . History of proteinuria syndrome   . Edema extremities   . RLS (restless legs syndrome)   . Dyspareunia   . Obese   . Onychomycosis 05/09/2011  . Low back pain 05/09/2011  . RUQ pain 09/20/2011  . Hip pain, left 11/01/2011  . Lymphadenitis 06/16/2012  . Otitis externa 06/23/2012    left  . GERD (gastroesophageal reflux disease)   . Insomnia 03/11/2013  .  Perimenopause 03/11/2013  . Acute sinusitis 03/27/2013  . Unspecified constipation 05/13/2013  . Arthritis 06/09/2013  . Urinary frequency 08/26/2013  . Oral lesion 08/26/2013  . Encounter for preventative adult health care exam with abnormal findings 04/21/2014  . Sleep apnea 04/21/2014  . Myalgia and myositis 02/15/2015    Social History   Social History  . Marital Status: Married    Spouse Name: N/A  . Number of Children: N/A  . Years of Education: N/A   Occupational History  . Not on file.   Social History Main Topics  . Smoking status: Former Smoker -- 0.50 packs/day for 20 years    Types: Cigarettes    Quit date: 07/12/1992  . Smokeless tobacco: Never Used  . Alcohol Use: Yes     Comment: occasionaly  . Drug Use: No  . Sexual Activity:    Partners: Male   Other Topics Concern  . Not on file   Social History Narrative    Past Surgical History  Procedure Laterality Date  . Cesarean section  1993  . Back surgery  2006    low, discectomy for herniated disc  . Tubal ligation  1993  . Carpal tunnel release Right 2006  . Bladder suspension N/A 11/07/2012    Procedure: [facility];  Surgeon: Malka So, MD;  Location: [facility];  Service: Urology;  Laterality: N/A;  . Cystoscopy N/A 11/07/2012    Procedure: CYSTOSCOPY;  Surgeon: Malka So, MD;  Location: [facility];  Service: Urology;  Laterality: N/A;    Family History  Problem Relation Age of Onset  . Cancer Mother 27    thyroid, malignant brain tumor  . Arthritis Mother     neck, back  . Hepatitis Sister     Hepatitis C  . Heart disease Sister     stent  . Diabetes Maternal Grandmother   . Arthritis Father     back pain  . Heart disease Father     Allergies  Allergen Reactions  . Carbamazepine Hives  . Ceclor [Cefaclor]     Tongue swell  . Morphine And Related Itching  . Atorvastatin Other (See Comments)    Leg pain    Current Outpatient Prescriptions on File  Prior to Visit  Medication Sig Dispense Refill  . albuterol (PROVENTIL HFA;VENTOLIN HFA) 108 (90 BASE) MCG/ACT inhaler Inhale 2 puffs into the lungs every 6 (six) hours as needed for wheezing or shortness of breath. 1 Inhaler 2  . baclofen (LIORESAL) 20 MG tablet   4  . benzonatate (TESSALON) 100 MG capsule Take 1 capsule (100 mg total) by mouth 3 (three) times daily as needed. 21 capsule 0  . BIOTIN PO Take by mouth.    . Calcium Carbonate-Vitamin D (CALTRATE 600+D) 600-400 MG-UNIT per tablet Take 1 tablet by mouth daily.     . Cholecalciferol (VITAMIN D3) 1000 UNITS CAPS Take by mouth.      . conjugated estrogens (PREMARIN) vaginal cream Place vaginally as directed. 3 times a week at bedtime    . diazepam (VALIUM) 10 MG tablet Take 1 tablet (10 mg total) by mouth daily as needed for anxiety. 40 tablet 1  . FLAXSEED, LINSEED, PO Take by mouth every other day.     . fluticasone (FLONASE) 50 MCG/ACT nasal spray Place 2 sprays into both nostrils daily. 16 g 6  . furosemide (LASIX) 40 MG tablet 2 tabs po qd x 3 days then 1 tab po daily 120 tablet 1  . Gabapentin Enacarbil 600 MG TBCR Take by mouth 2 (two) times daily.    Marland Kitchen HYDROcodone-acetaminophen (NORCO) 7.5-325 MG tablet Take 1 tablet by mouth every 6 (six) hours as needed for moderate pain. 30 tablet 0  . hydrOXYzine (ATARAX/VISTARIL) 50 MG tablet Take 50 mg by mouth every 6 (six) hours as needed.    . lamoTRIgine (LAMICTAL) 200 MG tablet Take 200 mg by mouth daily.      . metolazone (ZAROXOLYN) 2.5 MG tablet Take 1 tablet (2.5 mg total) by mouth 2 (two) times daily as needed. 30 tablet 2  . Nutritional Supplements (ESTROVEN PO) Take by mouth.    . pantoprazole (PROTONIX) 40 MG tablet Take 1 tablet (40 mg total) by mouth daily. 90 tablet 1  . potassium chloride SA (K-DUR,KLOR-CON) 20 MEQ tablet 2 tabs po qd x 5 days then 1 tab po daily 120 tablet 1  . promethazine (PHENERGAN) 25 MG tablet Take 1 tablet (25 mg total) by mouth every 8 (eight)  hours as needed for nausea. 40 tablet 5  . ranitidine (ZANTAC) 300 MG tablet TAKE 1 TABLET BY MOUTH AT BEDTIME 30 tablet 3  . ranitidine (ZANTAC) 300 MG tablet TAKE 1 TABLET BY MOUTH AT BEDTIME 30 tablet 6  . rOPINIRole (REQUIP) 1 MG tablet Take 1 mg by mouth daily.      Marland Kitchen  simvastatin (ZOCOR) 40 MG tablet Take 1 tablet (40 mg total) by mouth daily. 30 tablet 5   No current facility-administered medications on file prior to visit.    BP 118/72 mmHg  Pulse 91  Temp(Src) 97.7 F (36.5 C) (Oral)  Ht 5\' 5"  (1.651 m)  Wt 184 lb 9.6 oz (83.734 kg)  BMI 30.72 kg/m2  SpO2 97%  LMP 10/03/2012       Objective:   Physical Exam  General  Mental Status - Alert. General Appearance - Well groomed. Not in acute distress.  Skin Rashes- No Rashes.  HEENT Head- Normal. Ear Auditory Canal - Left- Normal. Right - Normal.Tympanic Membrane- Left- mild tm Right- Normal. Eye Sclera/Conjunctiva- Left- Normal. Right- Normal. Nose & Sinuses Nasal Mucosa- Left-  Boggy and Congested. Right-  Boggy and  Congested.Bilateral maxillary and frontal sinus pressure. Mouth & Throat Lips: Upper Lip- Normal: no dryness, cracking, pallor, cyanosis, or vesicular eruption. Lower Lip-Normal: no dryness, cracking, pallor, cyanosis or vesicular eruption. Buccal Mucosa- Bilateral- No Aphthous ulcers. Oropharynx- No Discharge or Erythema. +pnd Tonsils: Characteristics- Bilateral- No Erythema or Congestion. Size/Enlargement- Bilateral- No enlargement. Discharge- bilateral-None.  Neck Neck- Supple. No Masses.   Chest and Lung Exam Auscultation: Breath Sounds:-Clear even and unlabored.  Cardiovascular Auscultation:Rythm- Regular, rate and rhythm. Murmurs & Other Heart Sounds:Ausculatation of the heart reveal- No Murmurs.  Lymphatic Head & Neck General Head & Neck Lymphatics: Bilateral: Description- No Localized lymphadenopathy  Breast- symmetric. No dc from nipple. Axillary area clear no lymph nodes.  Adjacent to rt nipple 2.0-2.5 cm lump/mass. Lt side no lumps or masses  Lower ext- negative homans signs.      Assessment & Plan:  For pt rt breast lump/mass will get diagnostic rt breast US and mammogram. Will follow results.  You also  appear to have a sinus infection. I am prescribing azithromycin  antibiotic for the infection. To help with the nasal congestion I prescribed  flonase nasal steroid. For your associated cough, I prescribed cough medicine benzonatate.  Rest, hydrate, tylenol for fever.  Follow up in 7 days or as needed.

## 2015-08-20 NOTE — Progress Notes (Signed)
Pre visit review using our clinic review tool, if applicable. No additional management support is needed unless otherwise documented below in the visit note. 

## 2015-08-20 NOTE — Telephone Encounter (Signed)
Received paperwork faxed from [facility] stating, "We have received your referral, however, we will need to know the position of the mass on the right breast. The procedure also needs to be entered in as MM DIAG BREAST TOMO BILATERAL [or you can just type in [id number]].  Called the patient and explained that she needs an OV before the Breast Center will schedule her to be seen; pt scheduled with Mackie Pai at 3:30p today; transferred information to provider/SLS 02/08

## 2015-08-20 NOTE — Patient Instructions (Addendum)
For pt rt breast lump/mass will get diagnostic rt breast US and mammogram. Will follow results.  You also  appear to have a sinus infection. I am prescribing azithromycin  antibiotic for the infection. To help with the nasal congestion I prescribed  flonase nasal steroid. For your associated cough, I prescribed cough medicine benzonatate.  Rest, hydrate, tylenol for fever.  Follow up in 7 days or as needed.

## 2015-08-22 ENCOUNTER — Other Ambulatory Visit: Payer: Self-pay | Admitting: Family Medicine

## 2015-08-22 DIAGNOSIS — N6310 Unspecified lump in the right breast, unspecified quadrant: Secondary | ICD-10-CM

## 2015-08-25 ENCOUNTER — Telehealth: Payer: Self-pay | Admitting: *Deleted

## 2015-08-25 NOTE — Telephone Encounter (Signed)
Received Physician orders; signed by provider, faxed and sent to scan/SLS 02/10

## 2015-08-27 ENCOUNTER — Other Ambulatory Visit: Payer: Self-pay | Admitting: Family Medicine

## 2015-08-27 MED FILL — POTASSIUM CL ER 20 MEQ TABL: 20 | 30 days supply | Qty: 30 | Fill #6

## 2015-08-27 MED FILL — SIMVASTATIN 40 MG TABLET: 40 | 30 days supply | Qty: 30 | Fill #0

## 2015-08-27 MED FILL — FUROSEMIDE 40 MG TABLET: 40 | 30 days supply | Qty: 30 | Fill #4

## 2015-08-28 ENCOUNTER — Ambulatory Visit
Admission: RE | Admit: 2015-08-28 | Discharge: 2015-08-28 | Disposition: A | Discharge status: Home / Self Care | Payer: Medicare Other | Source: Ambulatory Visit | Attending: Family Medicine | Admitting: Family Medicine

## 2015-08-28 DIAGNOSIS — N6310 Unspecified lump in the right breast, unspecified quadrant: Secondary | ICD-10-CM

## 2015-08-28 DIAGNOSIS — N6489 Other specified disorders of breast: Secondary | ICD-10-CM | POA: Diagnosis not present

## 2015-08-28 DIAGNOSIS — R922 Inconclusive mammogram: Secondary | ICD-10-CM | POA: Diagnosis not present

## 2015-09-03 ENCOUNTER — Telehealth: Payer: Self-pay | Admitting: Family Medicine

## 2015-09-03 NOTE — Telephone Encounter (Signed)
Last filled: 04/18/15 Amt: 30, 0 Last OV:  04/18/15 [facility] contract on file UDS:  Moderate risk  Please advise.

## 2015-09-03 NOTE — Telephone Encounter (Signed)
OK to refill Hydrocodone same strength, same sig, #30

## 2015-09-03 NOTE — Telephone Encounter (Signed)
Caller name: [name]   Relationship to patient: Self  Can be reached: [phone]   Reason for call: refill request for HYDROcodone Rx.

## 2015-09-04 MED ORDER — HYDROCODONE-ACETAMINOPHEN 7.5-325 MG PO TABS: 1.0000 | ORAL_TABLET | Freq: Four times a day (QID) | ORAL | Status: DC | PRN

## 2015-09-04 MED FILL — FLUTICASONE PROP 50 MCG SPR: 50 | 30 days supply | Qty: 16 | Fill #1

## 2015-09-04 MED FILL — lamoTRIgine 150 MG TABS: 150 | 30 days supply | Qty: 60 | Fill #0

## 2015-09-04 NOTE — Telephone Encounter (Signed)
Printed and on counter for signature. Patient notified to pickup hardcopy today at the front desk.

## 2015-09-05 MED FILL — HYDROCODON-APAP 7.5-325: 7.5-325 | 7 days supply | Qty: 30 | Fill #0

## 2015-09-09 MED FILL — raNITIdine HCL 300 MG TABS: 300 | 30 days supply | Qty: 30 | Fill #3

## 2015-09-15 ENCOUNTER — Encounter: Payer: Self-pay | Admitting: Family Medicine

## 2015-09-25 ENCOUNTER — Other Ambulatory Visit: Payer: Self-pay | Admitting: Family Medicine

## 2015-09-25 MED FILL — BACLOFEN 20 MG TABLET: 20 | 90 days supply | Qty: 90 | Fill #0

## 2015-09-25 MED FILL — HYDROXYZINE PAM 50 MG CAP: 50 | 8 days supply | Qty: 60 | Fill #1

## 2015-09-25 MED FILL — SIMVASTATIN 40 MG TABLET: 40 | 30 days supply | Qty: 30 | Fill #1

## 2015-10-15 MED FILL — lamoTRIgine 150 MG TABS: 150 | 30 days supply | Qty: 60 | Fill #1

## 2015-10-15 MED FILL — PROMETHAZINE 25 MG TABLET: 25 | 14 days supply | Qty: 40 | Fill #3

## 2015-10-22 MED FILL — POTASSIUM CL ER 20 MEQ TABL: 20 | 25 days supply | Qty: 25 | Fill #7

## 2015-10-22 MED FILL — FUROSEMIDE 40 MG TABLET: 40 | 30 days supply | Qty: 30 | Fill #5

## 2015-10-23 MED FILL — SIMVASTATIN 40 MG TABLET: 40 | 30 days supply | Qty: 30 | Fill #2

## 2015-10-27 MED FILL — HYDROXYZINE PAM 50 MG CAP: 50 | 8 days supply | Qty: 60 | Fill #0

## 2015-11-13 MED FILL — raNITIdine HCL 300 MG TABS: 300 | 30 days supply | Qty: 30 | Fill #0

## 2015-11-14 MED FILL — PROMETHAZINE 25 MG TABLET: 25 | 14 days supply | Qty: 40 | Fill #4

## 2015-11-17 ENCOUNTER — Other Ambulatory Visit: Payer: Self-pay | Admitting: Family Medicine

## 2015-11-17 MED FILL — SIMVASTATIN 40 MG TABLET: 40 | 30 days supply | Qty: 30 | Fill #3

## 2015-11-17 MED FILL — lamoTRIgine 150 MG TABS: 150 | 30 days supply | Qty: 60 | Fill #2

## 2015-11-17 MED FILL — POTASSIUM CL ER 20 MEQ TABL: 20 | 30 days supply | Qty: 30 | Fill #0

## 2015-11-17 MED FILL — FUROSEMIDE 40 MG TABLET: 40 | 30 days supply | Qty: 30 | Fill #6

## 2015-11-26 ENCOUNTER — Telehealth: Payer: Self-pay | Admitting: Family Medicine

## 2015-11-26 NOTE — Telephone Encounter (Signed)
Last OV: 08/20/15 w/ Edward, 04/18/15 w/ Dr. Charlett Blake Last filled: 09/04/15, #30, 0 RF  Sig: Take 1 tablet by mouth every 6 (six) hours as needed for moderate pain. UDS: 08/14/15, positive for valium, negative for opiates, moderate risk

## 2015-11-26 NOTE — Telephone Encounter (Signed)
Refill request HYDROcodone  CB: [phone]

## 2015-11-26 NOTE — Telephone Encounter (Signed)
OK to refill Hydrocodone same strength, same sig, same number 

## 2015-11-27 MED ORDER — HYDROCODONE-ACETAMINOPHEN 7.5-325 MG PO TABS: 1.0000 | ORAL_TABLET | Freq: Four times a day (QID) | ORAL | Status: DC | PRN

## 2015-11-27 MED FILL — HYDROXYZINE PAM 50 MG CAP: 50 | 8 days supply | Qty: 60 | Fill #1

## 2015-11-27 NOTE — Telephone Encounter (Signed)
Printed and on counter for signature. 

## 2015-11-27 NOTE — Telephone Encounter (Signed)
Called the patient informed hardcopy is ready for pickup at the front desk. 

## 2015-11-28 MED FILL — HYDROCODON-APAP 7.5-325: 7.5-325 | 7 days supply | Qty: 30 | Fill #0

## 2015-12-02 DIAGNOSIS — J342 Deviated nasal septum: Secondary | ICD-10-CM | POA: Diagnosis not present

## 2015-12-02 DIAGNOSIS — J324 Chronic pansinusitis: Secondary | ICD-10-CM | POA: Diagnosis not present

## 2015-12-16 MED FILL — POTASSIUM CL ER 20 MEQ TABL: 20 | 30 days supply | Qty: 30 | Fill #1

## 2015-12-16 MED FILL — lamoTRIgine 150 MG TABS: 150 | 30 days supply | Qty: 60 | Fill #0

## 2015-12-16 MED FILL — SIMVASTATIN 40 MG TABLET: 40 | 30 days supply | Qty: 30 | Fill #4

## 2015-12-16 MED FILL — raNITIdine HCL 300 MG TABS: 300 | 30 days supply | Qty: 30 | Fill #1

## 2015-12-18 ENCOUNTER — Other Ambulatory Visit: Payer: Self-pay | Admitting: Family Medicine

## 2015-12-18 MED ORDER — FUROSEMIDE 40 MG PO TABS: ORAL_TABLET | ORAL | Status: DC

## 2015-12-18 MED FILL — FUROSEMIDE 40 MG TABLET: 40 | 90 days supply | Qty: 93 | Fill #0

## 2016-01-05 MED FILL — HYDROXYZINE PAM 50 MG CAP: 50 | 8 days supply | Qty: 60 | Fill #0

## 2016-01-14 ENCOUNTER — Other Ambulatory Visit: Payer: Self-pay | Admitting: Family Medicine

## 2016-01-14 MED FILL — lamoTRIgine 150 MG TABS: 150 | 30 days supply | Qty: 60 | Fill #1

## 2016-01-14 MED FILL — POTASSIUM CL ER 20 MEQ TABL: 20 | 30 days supply | Qty: 30 | Fill #0

## 2016-01-14 MED FILL — diazePAM 5 MG TABS: 5 | 40 days supply | Qty: 40 | Fill #1

## 2016-02-04 ENCOUNTER — Other Ambulatory Visit: Payer: Self-pay | Admitting: Family Medicine

## 2016-02-04 MED ORDER — HYDROCODONE-ACETAMINOPHEN 7.5-325 MG PO TABS: 1.0000 | ORAL_TABLET | Freq: Four times a day (QID) | ORAL | 0 refills | Status: DC | PRN

## 2016-02-04 NOTE — Telephone Encounter (Signed)
Refill request for Hydrocodone-APAP Last filled by MD on - 11/27/15, #30x0 Last UDS - 08/14/15, Moderate risk; only screened for Benzos/Diazepam Refill print in absence of PCP; pt informed/SLS 0726

## 2016-02-04 NOTE — Telephone Encounter (Signed)
Relation to [id number] Call back number:[phone]   Reason for call:  Patient 02/06/16 medication follow up appointment had to be [facility] due to provider patient [facility] to next available which is 03/09/16. Patient requesting a refill HYDROcodone-acetaminophen (NORCO) 7.5-325 MG tablet

## 2016-02-05 MED FILL — HYDROXYZINE PAM 50 MG CAP: 50 | 8 days supply | Qty: 60 | Fill #1

## 2016-02-05 MED FILL — SIMVASTATIN 40 MG TABLET: 40 | 30 days supply | Qty: 30 | Fill #5

## 2016-02-06 ENCOUNTER — Ambulatory Visit: Payer: Medicare Other | Admitting: Family Medicine

## 2016-02-06 MED FILL — HYDROCODON-APAP 7.5-325: 7.5-325 | 8 days supply | Qty: 30 | Fill #0

## 2016-03-01 MED FILL — lamoTRIgine 150 MG TABS: 150 | 30 days supply | Qty: 60 | Fill #0

## 2016-03-09 ENCOUNTER — Ambulatory Visit (INDEPENDENT_AMBULATORY_CARE_PROVIDER_SITE_OTHER): Payer: Medicare Other | Admitting: Family Medicine

## 2016-03-09 VITALS — BP 90/60 | HR 76 | Temp 97.9°F | Wt 201.6 lb

## 2016-03-09 DIAGNOSIS — R5383 Other fatigue: Secondary | ICD-10-CM | POA: Diagnosis not present

## 2016-03-09 DIAGNOSIS — E669 Obesity, unspecified: Secondary | ICD-10-CM | POA: Diagnosis not present

## 2016-03-09 DIAGNOSIS — R109 Unspecified abdominal pain: Secondary | ICD-10-CM

## 2016-03-09 DIAGNOSIS — K5900 Constipation, unspecified: Secondary | ICD-10-CM

## 2016-03-09 DIAGNOSIS — R252 Cramp and spasm: Secondary | ICD-10-CM | POA: Diagnosis not present

## 2016-03-09 DIAGNOSIS — K219 Gastro-esophageal reflux disease without esophagitis: Secondary | ICD-10-CM

## 2016-03-09 DIAGNOSIS — G4700 Insomnia, unspecified: Secondary | ICD-10-CM

## 2016-03-09 DIAGNOSIS — E785 Hyperlipidemia, unspecified: Secondary | ICD-10-CM

## 2016-03-09 DIAGNOSIS — R739 Hyperglycemia, unspecified: Secondary | ICD-10-CM | POA: Diagnosis not present

## 2016-03-09 DIAGNOSIS — M545 Low back pain: Secondary | ICD-10-CM | POA: Diagnosis not present

## 2016-03-09 DIAGNOSIS — I959 Hypotension, unspecified: Secondary | ICD-10-CM

## 2016-03-09 LAB — COMPREHENSIVE METABOLIC PANEL
ALBUMIN: 4.2 g/dL (ref 3.5–5.2)
ALT: 24 U/L (ref 0–35)
AST: 18 U/L (ref 0–37)
Alkaline Phosphatase: 61 U/L (ref 39–117)
BILIRUBIN TOTAL: 0.5 mg/dL (ref 0.2–1.2)
BUN: 12 mg/dL (ref 6–23)
CHLORIDE: 108 meq/L (ref 96–112)
CO2: 29 mEq/L (ref 19–32)
CREATININE: 0.82 mg/dL (ref 0.40–1.20)
Calcium: 9.1 mg/dL (ref 8.4–10.5)
GFR: 76.06 mL/min (ref >60.00)
Glucose, Bld: 84 mg/dL (ref 70–99)
Potassium: 4.4 mEq/L (ref 3.5–5.1)
SODIUM: 140 meq/L (ref 135–145)
Total Protein: 6.7 g/dL (ref 6.0–8.3)

## 2016-03-09 LAB — CBC
HCT: 40.7 % (ref 36.0–46.0)
HEMOGLOBIN: 13.6 g/dL (ref 12.0–15.0)
MCHC: 33.5 g/dL (ref 30.0–36.0)
MCV: 85.6 fl (ref 78.0–100.0)
PLATELETS: 256 10*3/uL (ref 150.0–400.0)
RBC: 4.75 Mil/uL (ref 3.87–5.11)
RDW: 13.3 % (ref 11.5–15.5)
WBC: 7.4 10*3/uL (ref 4.0–10.5)

## 2016-03-09 LAB — LIPID PANEL
Cholesterol: 194 mg/dL (ref 0–200)
HDL: 49.8 mg/dL (ref >39.00)
NONHDL: 144.25
Total CHOL/HDL Ratio: 4
Triglycerides: 251 mg/dL — ABNORMAL HIGH (ref 0.0–149.0)
VLDL: 50.2 mg/dL — ABNORMAL HIGH (ref 0.0–40.0)

## 2016-03-09 LAB — MAGNESIUM: MAGNESIUM: 2.3 mg/dL (ref 1.5–2.5)

## 2016-03-09 LAB — LDL CHOLESTEROL, DIRECT: LDL DIRECT: 105 mg/dL

## 2016-03-09 LAB — TSH: TSH: 0.55 u[IU]/mL (ref 0.35–4.50)

## 2016-03-09 MED ORDER — METOLAZONE 2.5 MG PO TABS: 2.5000 mg | ORAL_TABLET | Freq: Two times a day (BID) | ORAL | 2 refills | Status: DC | PRN

## 2016-03-09 MED ORDER — DIAZEPAM 10 MG PO TABS: 10.0000 mg | ORAL_TABLET | Freq: Every day | ORAL | 1 refills | Status: DC | PRN

## 2016-03-09 MED ORDER — FUROSEMIDE 40 MG PO TABS
ORAL_TABLET | ORAL | 1 refills | Status: DC
Start: 2016-03-09 — End: 2017-03-17

## 2016-03-09 MED ORDER — OMEPRAZOLE 40 MG PO CPDR: 40.0000 mg | DELAYED_RELEASE_CAPSULE | Freq: Every day | ORAL | 3 refills | Status: DC

## 2016-03-09 MED ORDER — GABAPENTIN 300 MG PO CAPS: 300.0000 mg | ORAL_CAPSULE | Freq: Two times a day (BID) | ORAL | 3 refills | Status: DC

## 2016-03-09 MED ORDER — RANITIDINE HCL 300 MG PO CAPS: 300.0000 mg | ORAL_CAPSULE | Freq: Every evening | ORAL | 5 refills | Status: DC

## 2016-03-09 MED FILL — GABAPENTIN 300 MG CAPSULE: 300 | 30 days supply | Qty: 60 | Fill #0

## 2016-03-09 MED FILL — raNITIdine HCL 300 MG TABS: 300 | 30 days supply | Qty: 30 | Fill #0

## 2016-03-09 MED FILL — metOLazone 2.5 MG TABS: 2.5 | 15 days supply | Qty: 30 | Fill #0

## 2016-03-09 MED FILL — diazePAM 10 MG TABS: 10 | 40 days supply | Qty: 40 | Fill #0

## 2016-03-09 MED FILL — FUROSEMIDE 40 MG TABLET: 40 | 90 days supply | Qty: 90 | Fill #0

## 2016-03-09 MED FILL — OMEPRAZOLE DR 40 MG CAPSULE: 40 | 30 days supply | Qty: 30 | Fill #0

## 2016-03-09 NOTE — Progress Notes (Signed)
Pre visit review using our clinic review tool, if applicable. No additional management support is needed unless otherwise documented below in the visit note. 

## 2016-03-09 NOTE — Patient Instructions (Signed)

## 2016-03-10 ENCOUNTER — Encounter: Payer: Self-pay | Admitting: Gastroenterology

## 2016-03-10 ENCOUNTER — Telehealth: Payer: Self-pay | Admitting: Family Medicine

## 2016-03-10 LAB — URINALYSIS
BILIRUBIN URINE: NEGATIVE
HGB URINE DIPSTICK: NEGATIVE
Ketones, ur: NEGATIVE
LEUKOCYTES UA: NEGATIVE
NITRITE: NEGATIVE
Specific Gravity, Urine: 1.025 (ref 1.000–1.030)
Total Protein, Urine: NEGATIVE
Urine Glucose: NEGATIVE
Urobilinogen, UA: 0.2 (ref 0.0–1.0)
pH: 5.5 (ref 5.0–8.0)

## 2016-03-10 LAB — H. PYLORI ANTIBODY, IGG: H Pylori IgG: NEGATIVE

## 2016-03-10 MED ORDER — POTASSIUM CHLORIDE CRYS ER 20 MEQ PO TBCR: 20.0000 meq | EXTENDED_RELEASE_TABLET | Freq: Every day | ORAL | 1 refills | Status: DC

## 2016-03-10 MED FILL — POTASSIUM CL ER 20 MEQ TABL: 20 | 90 days supply | Qty: 90 | Fill #0

## 2016-03-10 NOTE — Telephone Encounter (Signed)
Reviewing for Dr. Charlett Blake as she is out of office. Patient has CPE yesterday. Labs were stable. I have sent in her refill.

## 2016-03-10 NOTE — Telephone Encounter (Signed)
Caller name:Marelly Carlisi Relationship to patient: Can be [phone] [facility]acy: [facility] Reason for call:Requesting refill on potassium chloride 90 day supply

## 2016-03-11 ENCOUNTER — Other Ambulatory Visit: Payer: Self-pay | Admitting: Family Medicine

## 2016-03-11 MED FILL — HYDROXYZINE PAM 50 MG CAP: 50 | 8 days supply | Qty: 60 | Fill #0

## 2016-03-11 MED FILL — SIMVASTATIN 40 MG TABLET: 40 | 30 days supply | Qty: 30 | Fill #0

## 2016-03-21 NOTE — Assessment & Plan Note (Signed)
Encouraged increased hydration and fiber in diet. Daily probiotics. If bowels not moving can use MOM 2 tbls po in 4 oz of warm prune juice by mouth every 2-3 days. If no results then repeat in 4 hours with  Dulcolax suppository pr, may repeat again in 4 more hours as needed. Seek care if symptoms worsen. Consider daily Miralax and/or Dulcolax if symptoms persist.  

## 2016-03-21 NOTE — Assessment & Plan Note (Signed)
minimize simple carbs. Increase exercise as tolerated.  

## 2016-03-21 NOTE — Progress Notes (Signed)
Patient ID: [name], female   DOB: 08-Apr-1956, 60 y.o.   MRN: [id number]   Subjective:    Patient ID: [name], female    DOB: 06-19-1956, 60 y.o.   MRN: [id number]  Chief Complaint  Patient presents with  . Medication Refill    HPI Patient is in today for follow up. She is struggling with abdominal pain, nausea and heartburn. Is noting some swelling in ankles as well. No change in bowel habits bloody or tarry stool. Denies CP/palp/SOB/HA/congestion/fevers or GU c/o. Taking meds as prescribed  Past Medical History:  Diagnosis Date  . Acute sinusitis 03/27/2013  . Allergy    seasonal  . Anxiety   . Arthritis 06/09/2013  . Depression   . Dyspareunia   . Edema extremities   . Encounter for preventative adult health care exam with abnormal findings 04/21/2014  . GERD (gastroesophageal reflux disease)   . Hip pain, left 11/01/2011  . History of proteinuria syndrome   . Hyperlipidemia   . Insomnia 03/11/2013  . Low back pain 05/09/2011  . Lymphadenitis 06/16/2012  . Myalgia and myositis 02/15/2015  . Obese   . Onychomycosis 05/09/2011  . Oral lesion 08/26/2013  . Otitis externa 06/23/2012   left  . Perimenopause 03/11/2013  . RLS (restless legs syndrome)   . RUQ pain 09/20/2011  . Sleep apnea 04/21/2014  . Unspecified constipation 05/13/2013  . Urinary frequency 08/26/2013    Past Surgical History:  Procedure Laterality Date  . BACK SURGERY  2006   low, discectomy for herniated disc  . BLADDER SUSPENSION N/A 11/07/2012   Procedure: [facility] SLING;  Surgeon: Malka So, MD;  Location: [facility];  Service: Urology;  Laterality: N/A;  . CARPAL TUNNEL RELEASE Right 2006  . CESAREAN SECTION  1993  . CYSTOSCOPY N/A 11/07/2012   Procedure: CYSTOSCOPY;  Surgeon: Malka So, MD;  Location: [facility];  Service: Urology;  Laterality: N/A;  . TUBAL LIGATION  1993    Family History  Problem Relation Age of Onset  . Cancer Mother 105   thyroid, malignant brain tumor  . Arthritis Mother     neck, back  . Hepatitis Sister     Hepatitis C  . Heart disease Sister     stent  . Diabetes Maternal Grandmother   . Arthritis Father     back pain  . Heart disease Father     Social History   Social History  . Marital status: Married    Spouse name: N/A  . Number of children: N/A  . Years of education: N/A   Occupational History  . Not on file.   Social History Main Topics  . Smoking status: Former Smoker    Packs/day: 0.50    Years: 20.00    Types: Cigarettes    Quit date: 07/12/1992  . Smokeless tobacco: Never Used  . Alcohol use Yes     Comment: occasionaly  . Drug use: No  . Sexual activity: Yes    Partners: Male   Other Topics Concern  . Not on file   Social History Narrative  . No narrative on file    Outpatient Medications Prior to Visit  Medication Sig Dispense Refill  . albuterol (PROVENTIL HFA;VENTOLIN HFA) 108 (90 BASE) MCG/ACT inhaler Inhale 2 puffs into the lungs every 6 (six) hours as needed for wheezing or shortness of breath. 1 Inhaler 2  . baclofen (LIORESAL) 20 MG tablet   4  .  baclofen (LIORESAL) 20 MG tablet TAKE 1 TABLET (20 MG) BY MOUTH AT BEDTIME AS NEEDED FOR MUSCLE SPASMS. 90 tablet 5  . benzonatate (TESSALON) 100 MG capsule Take 1 capsule (100 mg total) by mouth 3 (three) times daily as needed. 21 capsule 0  . BIOTIN PO Take by mouth.    . Calcium Carbonate-Vitamin D (CALTRATE 600+D) 600-400 MG-UNIT per tablet Take 1 tablet by mouth daily.     . Cholecalciferol (VITAMIN D3) 1000 UNITS CAPS Take by mouth.      . conjugated estrogens (PREMARIN) vaginal cream Place vaginally as directed. 3 times a week at bedtime    . FLAXSEED, LINSEED, PO Take by mouth every other day.     . fluticasone (FLONASE) 50 MCG/ACT nasal spray Place 2 sprays into both nostrils daily. 16 g 6  . HYDROcodone-acetaminophen (NORCO) 7.5-325 MG tablet Take 1 tablet by mouth every 6 (six) hours as needed for  moderate pain. 30 tablet 0  . hydrOXYzine (ATARAX/VISTARIL) 50 MG tablet Take 50 mg by mouth every 6 (six) hours as needed.    . lamoTRIgine (LAMICTAL) 200 MG tablet Take 200 mg by mouth daily.      . Nutritional Supplements (ESTROVEN PO) Take by mouth.    . pantoprazole (PROTONIX) 40 MG tablet Take 1 tablet (40 mg total) by mouth daily. 90 tablet 1  . promethazine (PHENERGAN) 25 MG tablet Take 1 tablet (25 mg total) by mouth every 8 (eight) hours as needed for nausea. 40 tablet 5  . ranitidine (ZANTAC) 300 MG tablet TAKE 1 TABLET BY MOUTH AT BEDTIME 30 tablet 3  . rOPINIRole (REQUIP) 1 MG tablet Take 1 mg by mouth daily.      Marland Kitchen azithromycin (ZITHROMAX) 250 MG tablet Take 2 tablets by mouth on day 1, followed by 1 tablet by mouth daily for 4 days. 6 tablet 0  . diazepam (VALIUM) 10 MG tablet Take 1 tablet (10 mg total) by mouth daily as needed for anxiety. 40 tablet 1  . furosemide (LASIX) 40 MG tablet 2 tabs po qd x 3 days then 1 tab po daily 120 tablet 1  . Gabapentin Enacarbil 600 MG TBCR Take by mouth 2 (two) times daily.    . metolazone (ZAROXOLYN) 2.5 MG tablet Take 1 tablet (2.5 mg total) by mouth 2 (two) times daily as needed. 30 tablet 2  . potassium chloride SA (K-DUR,KLOR-CON) 20 MEQ tablet Take 1 tablet (20 mEq total) by mouth daily. 30 tablet 0  . ranitidine (ZANTAC) 300 MG tablet TAKE 1 TABLET BY MOUTH AT BEDTIME 30 tablet 6  . simvastatin (ZOCOR) 40 MG tablet TAKE 1 TABLET (40 MG TOTAL) BY MOUTH DAILY. 30 tablet 5   No facility-administered medications prior to visit.     Allergies  Allergen Reactions  . Carbamazepine Hives  . Ceclor [Cefaclor]     Tongue swell  . Morphine And Related Itching  . Atorvastatin Other (See Comments)    Leg pain    Review of Systems  Constitutional: Negative for fever and malaise/fatigue.  HENT: Negative for congestion.   Eyes: Negative for blurred vision.  Respiratory: Negative for shortness of breath.   Cardiovascular: Negative for  chest pain, palpitations and leg swelling.  Gastrointestinal: Positive for abdominal pain, heartburn and nausea. Negative for blood in stool.  Genitourinary: Negative for dysuria and frequency.  Musculoskeletal: Negative for falls.  Skin: Negative for rash.  Neurological: Negative for dizziness, loss of consciousness and headaches.  Endo/Heme/Allergies: Negative for environmental  allergies.  Psychiatric/Behavioral: Negative for depression. The patient is not nervous/anxious.        Objective:    Physical Exam  Constitutional: She is oriented to person, place, and time. She appears well-developed and well-nourished. No distress.  HENT:  Head: Normocephalic and atraumatic.  Nose: Nose normal.  Eyes: Right eye exhibits no discharge. Left eye exhibits no discharge.  Neck: Normal range of motion. Neck supple.  Cardiovascular: Normal rate and regular rhythm.   No murmur heard. Pulmonary/Chest: Effort normal and breath sounds normal.  Abdominal: Soft. Bowel sounds are normal. There is no tenderness.  Musculoskeletal: She exhibits no edema.  Neurological: She is alert and oriented to person, place, and time.  Skin: Skin is warm and dry.  Psychiatric: She has a normal mood and affect.  Nursing note and vitals reviewed.   BP 90/60 (BP Location: Left Arm, Patient Position: Sitting, Cuff Size: Normal)   Pulse 76   Temp 97.9 F (36.6 C) (Oral)   Wt 201 lb 9.6 oz (91.4 kg)   LMP 10/03/2012   SpO2 96%   BMI 33.55 kg/m  Wt Readings from Last 3 Encounters:  03/09/16 201 lb 9.6 oz (91.4 kg)  08/20/15 184 lb 9.6 oz (83.7 kg)  04/18/15 200 lb 4 oz (90.8 kg)     Lab Results  Component Value Date   WBC 7.4 03/09/2016   HGB 13.6 03/09/2016   HCT 40.7 03/09/2016   PLT 256.0 03/09/2016   GLUCOSE 84 03/09/2016   CHOL 194 03/09/2016   TRIG 251.0 (H) 03/09/2016   HDL 49.80 03/09/2016   LDLDIRECT 105.0 03/09/2016   LDLCALC 117 (H) 04/18/2014   ALT 24 03/09/2016   AST 18 03/09/2016    NA 140 03/09/2016   K 4.4 03/09/2016   CL 108 03/09/2016   CREATININE 0.82 03/09/2016   BUN 12 03/09/2016   CO2 29 03/09/2016   TSH 0.55 03/09/2016   HGBA1C 5.5 04/18/2015    Lab Results  Component Value Date   TSH 0.55 03/09/2016   Lab Results  Component Value Date   WBC 7.4 03/09/2016   HGB 13.6 03/09/2016   HCT 40.7 03/09/2016   MCV 85.6 03/09/2016   PLT 256.0 03/09/2016   Lab Results  Component Value Date   NA 140 03/09/2016   K 4.4 03/09/2016   CO2 29 03/09/2016   GLUCOSE 84 03/09/2016   BUN 12 03/09/2016   CREATININE 0.82 03/09/2016   BILITOT 0.5 03/09/2016   ALKPHOS 61 03/09/2016   AST 18 03/09/2016   ALT 24 03/09/2016   PROT 6.7 03/09/2016   ALBUMIN 4.2 03/09/2016   CALCIUM 9.1 03/09/2016   ANIONGAP 13 01/22/2015   GFR 76.06 03/09/2016   Lab Results  Component Value Date   CHOL 194 03/09/2016   Lab Results  Component Value Date   HDL 49.80 03/09/2016   Lab Results  Component Value Date   LDLCALC 117 (H) 04/18/2014   Lab Results  Component Value Date   TRIG 251.0 (H) 03/09/2016   Lab Results  Component Value Date   CHOLHDL 4 03/09/2016   Lab Results  Component Value Date   HGBA1C 5.5 04/18/2015       Assessment & Plan:   Problem List Items Addressed This Visit    Low back pain   Relevant Orders   TSH (Completed)   CT Abdomen Pelvis W Contrast   Hyperlipidemia    Encouraged heart healthy diet, increase exercise, avoid trans fats, consider a krill  oil cap daily      Relevant Medications   furosemide (LASIX) 40 MG tablet   metolazone (ZAROXOLYN) 2.5 MG tablet   Other Relevant Orders   Lipid panel (Completed)   Obese   Relevant Orders   TSH (Completed)   CT Abdomen Pelvis W Contrast   Insomnia   Relevant Medications   diazepam (VALIUM) 10 MG tablet   Other Relevant Orders   TSH (Completed)   Constipation    Encouraged increased hydration and fiber in diet. Daily probiotics. If bowels not moving can use MOM 2 tbls po in 4  oz of warm prune juice by mouth every 2-3 days. If no results then repeat in 4 hours with  Dulcolax suppository pr, may repeat again in 4 more hours as needed. Seek care if symptoms worsen. Consider daily Miralax and/or Dulcolax if symptoms persist.       GERD (gastroesophageal reflux disease)    Avoid offending foods, start probiotics. Do not eat large meals in late evening and consider raising head of bed.       Relevant Medications   ranitidine (ZANTAC) 300 MG capsule   omeprazole (PRILOSEC) 40 MG capsule   Other Relevant Orders   TSH (Completed)   H. pylori antibody, IgG (Completed)   Ambulatory referral to Gastroenterology   CT Abdomen Pelvis W Contrast   AP (abdominal pain)    H pylori negative, avoid offending foods, maintain probiotics and acid suppression.      Relevant Orders   TSH (Completed)   CBC (Completed)   Lipid panel (Completed)   Urinalysis (Completed)   H. pylori antibody, IgG (Completed)   Ambulatory referral to Gastroenterology   CT Abdomen Pelvis W Contrast   Hyperglycemia     minimize simple carbs. Increase exercise as tolerated      Relevant Orders   Comprehensive metabolic panel (Completed)   TSH (Completed)   CBC (Completed)   Arterial hypotension   Relevant Medications   furosemide (LASIX) 40 MG tablet   metolazone (ZAROXOLYN) 2.5 MG tablet   Other Relevant Orders   TSH (Completed)   CT Abdomen Pelvis W Contrast   Other fatigue - Primary   Relevant Orders   TSH (Completed)   CT Abdomen Pelvis W Contrast    Other Visit Diagnoses    Cramp and spasm       Relevant Orders   Comprehensive metabolic panel (Completed)   Magnesium (Completed)      I have discontinued Ms. Popper's Gabapentin Enacarbil and azithromycin. I have also changed her furosemide. Additionally, I am having her start on ranitidine, omeprazole, and gabapentin. Lastly, I am having her maintain her lamoTRIgine, rOPINIRole, Calcium Carbonate-Vitamin D, (FLAXSEED, LINSEED,  PO), Vitamin D3, BIOTIN PO, Nutritional Supplements (ESTROVEN PO), conjugated estrogens, hydrOXYzine, promethazine, pantoprazole, albuterol, fluticasone, baclofen, ranitidine, benzonatate, baclofen, HYDROcodone-acetaminophen, diazepam, metolazone, and diazepam.  Meds ordered this encounter  Medications  . DISCONTD: lamoTRIgine (LAMICTAL) 150 MG tablet    Sig: Take 2 tablets by mouth daily.    Refill:  1  . diazepam (VALIUM) 5 MG tablet    Sig: Take 1 tablet by mouth daily.    Refill:  1  . furosemide (LASIX) 40 MG tablet    Sig: 1 tab po bid x 3 days then 1 tab po daily    Dispense:  120 tablet    Refill:  1  . metolazone (ZAROXOLYN) 2.5 MG tablet    Sig: Take 1 tablet (2.5 mg total) by mouth 2 (two)  times daily as needed.    Dispense:  30 tablet    Refill:  2  . ranitidine (ZANTAC) 300 MG capsule    Sig: Take 1 capsule (300 mg total) by mouth every evening.    Dispense:  30 capsule    Refill:  5  . omeprazole (PRILOSEC) 40 MG capsule    Sig: Take 1 capsule (40 mg total) by mouth daily.    Dispense:  30 capsule    Refill:  3  . diazepam (VALIUM) 10 MG tablet    Sig: Take 1 tablet (10 mg total) by mouth daily as needed for anxiety.    Dispense:  40 tablet    Refill:  1  . gabapentin (NEURONTIN) 300 MG capsule    Sig: Take 1 capsule (300 mg total) by mouth 2 (two) times daily.    Dispense:  60 capsule    Refill:  3     Penni Homans, MD

## 2016-03-21 NOTE — Assessment & Plan Note (Signed)
Encouraged heart healthy diet, increase exercise, avoid trans fats, consider a krill oil cap daily 

## 2016-03-21 NOTE — Assessment & Plan Note (Signed)
H pylori negative, avoid offending foods, maintain probiotics and acid suppression.

## 2016-03-21 NOTE — Assessment & Plan Note (Signed)
Avoid offending foods, start probiotics. Do not eat large meals in late evening and consider raising head of bed.  

## 2016-04-05 MED FILL — OMEPRAZOLE DR 40 MG CAPSULE: 40 | 30 days supply | Qty: 30 | Fill #1

## 2016-04-05 MED FILL — SIMVASTATIN 40 MG TABLET: 40 | 30 days supply | Qty: 30 | Fill #1

## 2016-04-05 MED FILL — BACLOFEN 20 MG TABLET: 20 | 90 days supply | Qty: 90 | Fill #1

## 2016-04-05 MED FILL — raNITIdine HCL 300 MG TABS: 300 | 30 days supply | Qty: 30 | Fill #1

## 2016-04-07 ENCOUNTER — Encounter (HOSPITAL_BASED_OUTPATIENT_CLINIC_OR_DEPARTMENT_OTHER): Payer: Self-pay

## 2016-04-07 ENCOUNTER — Ambulatory Visit (HOSPITAL_BASED_OUTPATIENT_CLINIC_OR_DEPARTMENT_OTHER)
Admission: RE | Admit: 2016-04-07 | Discharge: 2016-04-07 | Disposition: A | Discharge status: Home / Self Care | Payer: Medicare Other | Source: Ambulatory Visit | Attending: Family Medicine | Admitting: Family Medicine

## 2016-04-07 DIAGNOSIS — R109 Unspecified abdominal pain: Secondary | ICD-10-CM

## 2016-04-07 DIAGNOSIS — R5383 Other fatigue: Secondary | ICD-10-CM

## 2016-04-07 DIAGNOSIS — I959 Hypotension, unspecified: Secondary | ICD-10-CM | POA: Diagnosis not present

## 2016-04-07 DIAGNOSIS — M545 Low back pain: Secondary | ICD-10-CM

## 2016-04-07 DIAGNOSIS — K760 Fatty (change of) liver, not elsewhere classified: Secondary | ICD-10-CM | POA: Diagnosis not present

## 2016-04-07 DIAGNOSIS — E669 Obesity, unspecified: Secondary | ICD-10-CM

## 2016-04-07 DIAGNOSIS — K219 Gastro-esophageal reflux disease without esophagitis: Secondary | ICD-10-CM | POA: Diagnosis not present

## 2016-04-07 MED ORDER — IOPAMIDOL (ISOVUE-300) INJECTION 61%
100.0000 mL | Freq: Once | INTRAVENOUS | Status: AC | PRN
  Administered 2016-04-07: 100 mL via INTRAVENOUS

## 2016-04-07 MED FILL — lamoTRIgine 150 MG TABS: 150 | 30 days supply | Qty: 60 | Fill #1

## 2016-04-15 ENCOUNTER — Other Ambulatory Visit: Payer: Self-pay | Admitting: Family Medicine

## 2016-04-15 MED ORDER — HYDROCODONE-ACETAMINOPHEN 7.5-325 MG PO TABS: 1.0000 | ORAL_TABLET | Freq: Four times a day (QID) | ORAL | 0 refills | Status: DC | PRN

## 2016-04-15 NOTE — Telephone Encounter (Signed)
Needs uds and then can have refill

## 2016-04-15 NOTE — Telephone Encounter (Signed)
Requesting: Hydrocodone Contract   08/14/2015 UDS  Moderate, due 11/11/2015 Last OV    03/09/2016 Last Refill   #30 with 0 refills on 02/04/2016  Please Advise

## 2016-04-15 NOTE — Telephone Encounter (Signed)
Relation to pt:self °Call back number:[phone] ° ° °Reason for call:  °Patient requesting a refill HYDROcodone-acetaminophen (NORCO) 7.5-325 MG tablet  ° °

## 2016-04-16 MED FILL — HYDROCODON-APAP 7.5-325: 7.5-325 | 7 days supply | Qty: 30 | Fill #0

## 2016-04-16 NOTE — Telephone Encounter (Signed)
Called the patient informed to pickup hardcopy at the front desk and to do UDS first.

## 2016-04-20 ENCOUNTER — Encounter: Payer: Self-pay | Admitting: Family Medicine

## 2016-04-20 ENCOUNTER — Ambulatory Visit (INDEPENDENT_AMBULATORY_CARE_PROVIDER_SITE_OTHER): Payer: Medicare Other | Admitting: Family Medicine

## 2016-04-20 VITALS — BP 108/72 | HR 80 | Temp 98.1°F | Ht 65.0 in | Wt 203.2 lb

## 2016-04-20 DIAGNOSIS — Z79891 Long term (current) use of opiate analgesic: Secondary | ICD-10-CM | POA: Diagnosis not present

## 2016-04-20 DIAGNOSIS — Z124 Encounter for screening for malignant neoplasm of cervix: Secondary | ICD-10-CM | POA: Diagnosis not present

## 2016-04-20 DIAGNOSIS — Z0000 Encounter for general adult medical examination without abnormal findings: Secondary | ICD-10-CM

## 2016-04-20 DIAGNOSIS — R739 Hyperglycemia, unspecified: Secondary | ICD-10-CM

## 2016-04-20 DIAGNOSIS — Z23 Encounter for immunization: Secondary | ICD-10-CM

## 2016-04-20 DIAGNOSIS — R600 Localized edema: Secondary | ICD-10-CM

## 2016-04-20 DIAGNOSIS — E782 Mixed hyperlipidemia: Secondary | ICD-10-CM | POA: Diagnosis not present

## 2016-04-20 DIAGNOSIS — Z79899 Other long term (current) drug therapy: Secondary | ICD-10-CM | POA: Diagnosis not present

## 2016-04-20 LAB — HEMOGLOBIN A1C: Hgb A1c MFr Bld: 5.3 % (ref 4.6–6.5)

## 2016-04-20 MED ORDER — AMOXICILLIN-POT CLAVULANATE 875-125 MG PO TABS: 1.0000 | ORAL_TABLET | Freq: Two times a day (BID) | ORAL | 0 refills | Status: DC

## 2016-04-20 MED ORDER — ROSUVASTATIN CALCIUM 20 MG PO TABS: 20.0000 mg | ORAL_TABLET | Freq: Every day | ORAL | 5 refills | Status: DC

## 2016-04-20 MED ORDER — ATORVASTATIN CALCIUM 40 MG PO TABS: 40.0000 mg | ORAL_TABLET | Freq: Every day | ORAL | 5 refills | Status: DC

## 2016-04-20 MED FILL — ATORVASTATIN 40 MG TABLET: 40 | 30 days supply | Qty: 30 | Fill #0

## 2016-04-20 MED FILL — AMOX-CLAV 875-125 MG TABLET: 875-125 | 10 days supply | Qty: 20 | Fill #0

## 2016-04-20 MED FILL — ROSUVASTATIN CALCIUM 20 MG: 20 | 30 days supply | Qty: 30 | Fill #0

## 2016-04-20 NOTE — Progress Notes (Signed)
Pre visit review using our clinic review tool, if applicable. No additional management support is needed unless otherwise documented below in the visit note. 

## 2016-04-20 NOTE — Patient Instructions (Addendum)
[place] has a compression hose company that will fit stockings for you Jobst stockings light weight, knee hi 10-20 mmhg on in am off in pm Minimize sodium in diet.  Elevate feet above heart for 15 minutes twice daily  NOW probiotic or other Plain Mucinex twice daily x 10 day  Preventive Care for Adults, Female A healthy lifestyle and preventive care can promote health and wellness. Preventive health guidelines for women include the following key practices.  A routine yearly physical is a good way to check with your health care provider about your health and preventive screening. It is a chance to share any concerns and updates on your health and to receive a thorough exam.  Visit your dentist for a routine exam and preventive care every 6 months. Brush your teeth twice a day and floss once a day. Good oral hygiene prevents tooth decay and gum disease.  The frequency of eye exams is based on your age, health, family medical history, use of contact lenses, and other factors. Follow your health care provider's recommendations for frequency of eye exams.  Eat a healthy diet. Foods like vegetables, fruits, whole grains, low-fat dairy products, and lean protein foods contain the nutrients you need without too many calories. Decrease your intake of foods high in solid fats, added sugars, and salt. Eat the right amount of calories for you.Get information about a proper diet from your health care provider, if necessary.  Regular physical exercise is one of the most important things you can do for your health. Most adults should get at least 150 minutes of moderate-intensity exercise (any activity that increases your heart rate and causes you to sweat) each week. In addition, most adults need muscle-strengthening exercises on 2 or more days a week.  Maintain a healthy weight. The body mass index (BMI) is a screening tool to identify possible weight problems. It provides an estimate of body fat based on  height and weight. Your health care provider can find your BMI and can help you achieve or maintain a healthy weight.For adults 20 years and older:  A BMI below 18.5 is considered underweight.  A BMI of 18.5 to 24.9 is normal.  A BMI of 25 to 29.9 is considered overweight.  A BMI of 30 and above is considered obese.  Maintain normal blood lipids and cholesterol levels by exercising and minimizing your intake of saturated fat. Eat a balanced diet with plenty of fruit and vegetables. Blood tests for lipids and cholesterol should begin at age 16 and be repeated every 5 years. If your lipid or cholesterol levels are high, you are over 50, or you are at high risk for heart disease, you may need your cholesterol levels checked more frequently.Ongoing high lipid and cholesterol levels should be treated with medicines if diet and exercise are not working.  If you smoke, find out from your health care provider how to quit. If you do not use tobacco, do not start.  Lung cancer screening is recommended for adults aged 65-80 years who are at high risk for developing lung cancer because of a history of smoking. A yearly low-dose CT scan of the lungs is recommended for people who have at least a 30-pack-year history of smoking and are a current smoker or have quit within the past 15 years. A pack year of smoking is smoking an average of 1 pack of cigarettes a day for 1 year (for example: 1 pack a day for 30 years or 2 packs  a day for 15 years). Yearly screening should continue until the smoker has stopped smoking for at least 15 years. Yearly screening should be stopped for people who develop a health problem that would prevent them from having lung cancer treatment.  If you are pregnant, do not drink alcohol. If you are breastfeeding, be very cautious about drinking alcohol. If you are not pregnant and choose to drink alcohol, do not have more than 1 drink per day. One drink is considered to be 12 ounces (355  mL) of beer, 5 ounces (148 mL) of wine, or 1.5 ounces (44 mL) of liquor.  Avoid use of street drugs. Do not share needles with anyone. Ask for help if you need support or instructions about stopping the use of drugs.  High blood pressure causes heart disease and increases the risk of stroke. Your blood pressure should be checked at least every 1 to 2 years. Ongoing high blood pressure should be treated with medicines if weight loss and exercise do not work.  If you are 29-86 years old, ask your health care provider if you should take aspirin to prevent strokes.  Diabetes screening is done by taking a blood sample to check your blood glucose level after you have not eaten for a certain period of time (fasting). If you are not overweight and you do not have risk factors for diabetes, you should be screened once every 3 years starting at age 27. If you are overweight or obese and you are 17-44 years of age, you should be screened for diabetes every year as part of your cardiovascular risk assessment.  Breast cancer screening is essential preventive care for women. You should practice "breast self-awareness." This means understanding the normal appearance and feel of your breasts and may include breast self-examination. Any changes detected, no matter how small, should be reported to a health care provider. Women in their 43s and 30s should have a clinical breast exam (CBE) by a health care provider as part of a regular health exam every 1 to 3 years. After age 34, women should have a CBE every year. Starting at age 32, women should consider having a mammogram (breast X-ray test) every year. Women who have a family history of breast cancer should talk to their health care provider about genetic screening. Women at a high risk of breast cancer should talk to their health care providers about having an MRI and a mammogram every year.  Breast cancer gene (BRCA)-related cancer risk assessment is recommended for  women who have family members with BRCA-related cancers. BRCA-related cancers include breast, ovarian, tubal, and peritoneal cancers. Having family members with these cancers may be associated with an increased risk for harmful changes (mutations) in the breast cancer genes BRCA1 and BRCA2. Results of the assessment will determine the need for genetic counseling and BRCA1 and BRCA2 testing.  Your health care provider may recommend that you be screened regularly for cancer of the pelvic organs (ovaries, uterus, and vagina). This screening involves a pelvic examination, including checking for microscopic changes to the surface of your cervix (Pap test). You may be encouraged to have this screening done every 3 years, beginning at age 53.  For women ages 23-65, health care providers may recommend pelvic exams and Pap testing every 3 years, or they may recommend the Pap and pelvic exam, combined with testing for human papilloma virus (HPV), every 5 years. Some types of HPV increase your risk of cervical cancer. Testing for HPV may  also be done on women of any age with unclear Pap test results.  Other health care providers may not recommend any screening for nonpregnant women who are considered low risk for pelvic cancer and who do not have symptoms. Ask your health care provider if a screening pelvic exam is right for you.  If you have had past treatment for cervical cancer or a condition that could lead to cancer, you need Pap tests and screening for cancer for at least 20 years after your treatment. If Pap tests have been discontinued, your risk factors (such as having a new sexual partner) need to be reassessed to determine if screening should resume. Some women have medical problems that increase the chance of getting cervical cancer. In these cases, your health care provider may recommend more frequent screening and Pap tests.  Colorectal cancer can be detected and often prevented. Most routine colorectal  cancer screening begins at the age of 25 years and continues through age 17 years. However, your health care provider may recommend screening at an earlier age if you have risk factors for colon cancer. On a yearly basis, your health care provider may provide home test kits to check for hidden blood in the stool. Use of a small camera at the end of a tube, to directly examine the colon (sigmoidoscopy or colonoscopy), can detect the earliest forms of colorectal cancer. Talk to your health care provider about this at age 26, when routine screening begins. Direct exam of the colon should be repeated every 5-10 years through age 42 years, unless early forms of precancerous polyps or small growths are found.  People who are at an increased risk for hepatitis B should be screened for this virus. You are considered at high risk for hepatitis B if:  You were born in a country where hepatitis B occurs often. Talk with your health care provider about which countries are considered high risk.  Your parents were born in a high-risk country and you have not received a shot to protect against hepatitis B (hepatitis B vaccine).  You have HIV or AIDS.  You use needles to inject street drugs.  You live with, or have sex with, someone who has hepatitis B.  You get hemodialysis treatment.  You take certain medicines for conditions like cancer, organ transplantation, and autoimmune conditions.  Hepatitis C blood testing is recommended for all people born from [birth year] through 1965 and any individual with known risks for hepatitis C.  Practice safe sex. Use condoms and avoid high-risk sexual practices to reduce the spread of sexually transmitted infections (STIs). STIs include gonorrhea, chlamydia, syphilis, trichomonas, herpes, HPV, and human immunodeficiency virus (HIV). Herpes, HIV, and HPV are viral illnesses that have no cure. They can result in disability, cancer, and death.  You should be screened for sexually  transmitted illnesses (STIs) including gonorrhea and chlamydia if:  You are sexually active and are younger than 24 years.  You are older than 24 years and your health care provider tells you that you are at risk for this type of infection.  Your sexual activity has changed since you were last screened and you are at an increased risk for chlamydia or gonorrhea. Ask your health care provider if you are at risk.  If you are at risk of being infected with HIV, it is recommended that you take a prescription medicine daily to prevent HIV infection. This is called preexposure prophylaxis (PrEP). You are considered at risk if:  You are  sexually active and do not regularly use condoms or know the HIV status of your partner(s).  You take drugs by injection.  You are sexually active with a partner who has HIV.  Talk with your health care provider about whether you are at high risk of being infected with HIV. If you choose to begin PrEP, you should first be tested for HIV. You should then be tested every 3 months for as long as you are taking PrEP.  Osteoporosis is a disease in which the bones lose minerals and strength with aging. This can result in serious bone fractures or breaks. The risk of osteoporosis can be identified using a bone density scan. Women ages 56 years and over and women at risk for fractures or osteoporosis should discuss screening with their health care providers. Ask your health care provider whether you should take a calcium supplement or vitamin D to reduce the rate of osteoporosis.  Menopause can be associated with physical symptoms and risks. Hormone replacement therapy is available to decrease symptoms and risks. You should talk to your health care provider about whether hormone replacement therapy is right for you.  Use sunscreen. Apply sunscreen liberally and repeatedly throughout the day. You should seek shade when your shadow is shorter than you. Protect yourself by  wearing long sleeves, pants, a wide-brimmed hat, and sunglasses year round, whenever you are outdoors.  Once a month, do a whole body skin exam, using a mirror to look at the skin on your back. Tell your health care provider of new moles, moles that have irregular borders, moles that are larger than a pencil eraser, or moles that have changed in shape or color.  Stay current with required vaccines (immunizations).  Influenza vaccine. All adults should be immunized every year.  Tetanus, diphtheria, and acellular pertussis (Td, Tdap) vaccine. Pregnant women should receive 1 dose of Tdap vaccine during each pregnancy. The dose should be obtained regardless of the length of time since the last dose. Immunization is preferred during the 27th-36th week of gestation. An adult who has not previously received Tdap or who does not know her vaccine status should receive 1 dose of Tdap. This initial dose should be followed by tetanus and diphtheria toxoids (Td) booster doses every 10 years. Adults with an unknown or incomplete history of completing a 3-dose immunization series with Td-containing vaccines should begin or complete a primary immunization series including a Tdap dose. Adults should receive a Td booster every 10 years.  Varicella vaccine. An adult without evidence of immunity to varicella should receive 2 doses or a second dose if she has previously received 1 dose. Pregnant females who do not have evidence of immunity should receive the first dose after pregnancy. This first dose should be obtained before leaving the health care facility. The second dose should be obtained 4-8 weeks after the first dose.  Human papillomavirus (HPV) vaccine. Females aged 13-26 years who have not received the vaccine previously should obtain the 3-dose series. The vaccine is not recommended for use in pregnant females. However, pregnancy testing is not needed before receiving a dose. If a female is found to be pregnant  after receiving a dose, no treatment is needed. In that case, the remaining doses should be delayed until after the pregnancy. Immunization is recommended for any person with an immunocompromised condition through the age of 30 years if she did not get any or all doses earlier. During the 3-dose series, the second dose should be obtained  4-8 weeks after the first dose. The third dose should be obtained 24 weeks after the first dose and 16 weeks after the second dose.  Zoster vaccine. One dose is recommended for adults aged 37 years or older unless certain conditions are present.  Measles, mumps, and rubella (MMR) vaccine. Adults born before [birth year] generally are considered immune to measles and mumps. Adults born in [birth year] or later should have 1 or more doses of MMR vaccine unless there is a contraindication to the vaccine or there is laboratory evidence of immunity to each of the three diseases. A routine second dose of MMR vaccine should be obtained at least 28 days after the first dose for students attending postsecondary schools, health care workers, or international travelers. People who received inactivated measles vaccine or an unknown type of measles vaccine during 1963-1967 should receive 2 doses of MMR vaccine. People who received inactivated mumps vaccine or an unknown type of mumps vaccine before 1979 and are at high risk for mumps infection should consider immunization with 2 doses of MMR vaccine. For females of childbearing age, rubella immunity should be determined. If there is no evidence of immunity, females who are not pregnant should be vaccinated. If there is no evidence of immunity, females who are pregnant should delay immunization until after pregnancy. Unvaccinated health care workers born before [birth year] who lack laboratory evidence of measles, mumps, or rubella immunity or laboratory confirmation of disease should consider measles and mumps immunization with 2 doses of MMR vaccine or rubella  immunization with 1 dose of MMR vaccine.  Pneumococcal 13-valent conjugate (PCV13) vaccine. When indicated, a person who is uncertain of his immunization history and has no record of immunization should receive the PCV13 vaccine. All adults 65 years of age and older should receive this vaccine. An adult aged 71 years or older who has certain medical conditions and has not been previously immunized should receive 1 dose of PCV13 vaccine. This PCV13 should be followed with a dose of pneumococcal polysaccharide (PPSV23) vaccine. Adults who are at high risk for pneumococcal disease should obtain the PPSV23 vaccine at least 8 weeks after the dose of PCV13 vaccine. Adults older than 58 years of age who have normal immune system function should obtain the PPSV23 vaccine dose at least 1 year after the dose of PCV13 vaccine.  Pneumococcal polysaccharide (PPSV23) vaccine. When PCV13 is also indicated, PCV13 should be obtained first. All adults aged 64 years and older should be immunized. An adult younger than age 11 years who has certain medical conditions should be immunized. Any person who resides in a nursing home or long-term care facility should be immunized. An adult smoker should be immunized. People with an immunocompromised condition and certain other conditions should receive both PCV13 and PPSV23 vaccines. People with human immunodeficiency virus (HIV) infection should be immunized as soon as possible after diagnosis. Immunization during chemotherapy or radiation therapy should be avoided. Routine use of PPSV23 vaccine is not recommended for American Indians, [place] Natives, or people younger than 65 years unless there are medical conditions that require PPSV23 vaccine. When indicated, people who have unknown immunization and have no record of immunization should receive PPSV23 vaccine. One-time revaccination 5 years after the first dose of PPSV23 is recommended for people aged 19-64 years who have chronic  kidney failure, nephrotic syndrome, asplenia, or immunocompromised conditions. People who received 1-2 doses of PPSV23 before age 53 years should receive another dose of PPSV23 vaccine at age 71 years or later if  at least 5 years have passed since the previous dose. Doses of PPSV23 are not needed for people immunized with PPSV23 at or after age 57 years.  Meningococcal vaccine. Adults with asplenia or persistent complement component deficiencies should receive 2 doses of quadrivalent meningococcal conjugate (MenACWY-D) vaccine. The doses should be obtained at least 2 months apart. Microbiologists working with certain meningococcal bacteria, [employer] recruits, people at risk during an outbreak, and people who travel to or live in countries with a high rate of meningitis should be immunized. A first-year college student up through age 39 years who is living in a residence hall should receive a dose if she did not receive a dose on or after her 16th birthday. Adults who have certain high-risk conditions should receive one or more doses of vaccine.  Hepatitis A vaccine. Adults who wish to be protected from this disease, have certain high-risk conditions, work with hepatitis A-infected animals, work in hepatitis A research labs, or travel to or work in countries with a high rate of hepatitis A should be immunized. Adults who were previously unvaccinated and who anticipate close contact with an international adoptee during the first 60 days after arrival in the [place] from a country with a high rate of hepatitis A should be immunized.  Hepatitis B vaccine. Adults who wish to be protected from this disease, have certain high-risk conditions, may be exposed to blood or other infectious body fluids, are household contacts or sex partners of hepatitis B positive people, are clients or workers in certain care facilities, or travel to or work in countries with a high rate of hepatitis B should be  immunized.  Haemophilus influenzae type b (Hib) vaccine. A previously unvaccinated person with asplenia or sickle cell disease or having a scheduled splenectomy should receive 1 dose of Hib vaccine. Regardless of previous immunization, a recipient of a hematopoietic stem cell transplant should receive a 3-dose series 6-12 months after her successful transplant. Hib vaccine is not recommended for adults with HIV infection. Preventive Services / Frequency Ages 63 to 57 years  Blood pressure check.** / Every 3-5 years.  Lipid and cholesterol check.** / Every 5 years beginning at age 78.  Clinical breast exam.** / Every 3 years for women in their 37s and 71s.  BRCA-related cancer risk assessment.** / For women who have family members with a BRCA-related cancer (breast, ovarian, tubal, or peritoneal cancers).  Pap test.** / Every 2 years from ages 31 through 58. Every 3 years starting at age 35 through age 71 or 48 with a history of 3 consecutive normal Pap tests.  HPV screening.** / Every 3 years from ages 67 through ages 54 to 61 with a history of 3 consecutive normal Pap tests.  Hepatitis C blood test.** / For any individual with known risks for hepatitis C.  Skin self-exam. / Monthly.  Influenza vaccine. / Every year.  Tetanus, diphtheria, and acellular pertussis (Tdap, Td) vaccine.** / Consult your health care provider. Pregnant women should receive 1 dose of Tdap vaccine during each pregnancy. 1 dose of Td every 10 years.  Varicella vaccine.** / Consult your health care provider. Pregnant females who do not have evidence of immunity should receive the first dose after pregnancy.  HPV vaccine. / 3 doses over 6 months, if 19 and younger. The vaccine is not recommended for use in pregnant females. However, pregnancy testing is not needed before receiving a dose.  Measles, mumps, rubella (MMR) vaccine.** / You need at least 1  dose of MMR if you were born in 1957 or later. You may also need  a 2nd dose. For females of childbearing age, rubella immunity should be determined. If there is no evidence of immunity, females who are not pregnant should be vaccinated. If there is no evidence of immunity, females who are pregnant should delay immunization until after pregnancy.  Pneumococcal 13-valent conjugate (PCV13) vaccine.** / Consult your health care provider.  Pneumococcal polysaccharide (PPSV23) vaccine.** / 1 to 2 doses if you smoke cigarettes or if you have certain conditions.  Meningococcal vaccine.** / 1 dose if you are age 39 to 66 years and a Market researcher living in a residence hall, or have one of several medical conditions, you need to get vaccinated against meningococcal disease. You may also need additional booster doses.  Hepatitis A vaccine.** / Consult your health care provider.  Hepatitis B vaccine.** / Consult your health care provider.  Haemophilus influenzae type b (Hib) vaccine.** / Consult your health care provider. Ages 93 to 34 years  Blood pressure check.** / Every year.  Lipid and cholesterol check.** / Every 5 years beginning at age 107 years.  Lung cancer screening. / Every year if you are aged 87-80 years and have a 30-pack-year history of smoking and currently smoke or have quit within the past 15 years. Yearly screening is stopped once you have quit smoking for at least 15 years or develop a health problem that would prevent you from having lung cancer treatment.  Clinical breast exam.** / Every year after age 69 years.  BRCA-related cancer risk assessment.** / For women who have family members with a BRCA-related cancer (breast, ovarian, tubal, or peritoneal cancers).  Mammogram.** / Every year beginning at age 66 years and continuing for as long as you are in good health. Consult with your health care provider.  Pap test.** / Every 3 years starting at age 63 years through age 74 or 66 years with a history of 3 consecutive normal Pap  tests.  HPV screening.** / Every 3 years from ages 94 years through ages 38 to 35 years with a history of 3 consecutive normal Pap tests.  Fecal occult blood test (FOBT) of stool. / Every year beginning at age 40 years and continuing until age 36 years. You may not need to do this test if you get a colonoscopy every 10 years.  Flexible sigmoidoscopy or colonoscopy.** / Every 5 years for a flexible sigmoidoscopy or every 10 years for a colonoscopy beginning at age 43 years and continuing until age 71 years.  Hepatitis C blood test.** / For all people born from [birth year] through 1963 and any individual with known risks for hepatitis C.  Skin self-exam. / Monthly.  Influenza vaccine. / Every year.  Tetanus, diphtheria, and acellular pertussis (Tdap/Td) vaccine.** / Consult your health care provider. Pregnant women should receive 1 dose of Tdap vaccine during each pregnancy. 1 dose of Td every 10 years.  Varicella vaccine.** / Consult your health care provider. Pregnant females who do not have evidence of immunity should receive the first dose after pregnancy.  Zoster vaccine.** / 1 dose for adults aged 31 years or older.  Measles, mumps, rubella (MMR) vaccine.** / You need at least 1 dose of MMR if you were born in 1957 or later. You may also need a second dose. For females of childbearing age, rubella immunity should be determined. If there is no evidence of immunity, females who are not pregnant should be vaccinated.  If there is no evidence of immunity, females who are pregnant should delay immunization until after pregnancy.  Pneumococcal 13-valent conjugate (PCV13) vaccine.** / Consult your health care provider.  Pneumococcal polysaccharide (PPSV23) vaccine.** / 1 to 2 doses if you smoke cigarettes or if you have certain conditions.  Meningococcal vaccine.** / Consult your health care provider.  Hepatitis A vaccine.** / Consult your health care provider.  Hepatitis B vaccine.** / Consult  your health care provider.  Haemophilus influenzae type b (Hib) vaccine.** / Consult your health care provider. Ages 69 years and over  Blood pressure check.** / Every year.  Lipid and cholesterol check.** / Every 5 years beginning at age 78 years.  Lung cancer screening. / Every year if you are aged 8-80 years and have a 30-pack-year history of smoking and currently smoke or have quit within the past 15 years. Yearly screening is stopped once you have quit smoking for at least 15 years or develop a health problem that would prevent you from having lung cancer treatment.  Clinical breast exam.** / Every year after age 1 years.  BRCA-related cancer risk assessment.** / For women who have family members with a BRCA-related cancer (breast, ovarian, tubal, or peritoneal cancers).  Mammogram.** / Every year beginning at age 69 years and continuing for as long as you are in good health. Consult with your health care provider.  Pap test.** / Every 3 years starting at age 20 years through age 31 or 12 years with 3 consecutive normal Pap tests. Testing can be stopped between 65 and 70 years with 3 consecutive normal Pap tests and no abnormal Pap or HPV tests in the past 10 years.  HPV screening.** / Every 3 years from ages 76 years through ages 19 or 54 years with a history of 3 consecutive normal Pap tests. Testing can be stopped between 65 and 70 years with 3 consecutive normal Pap tests and no abnormal Pap or HPV tests in the past 10 years.  Fecal occult blood test (FOBT) of stool. / Every year beginning at age 45 years and continuing until age 58 years. You may not need to do this test if you get a colonoscopy every 10 years.  Flexible sigmoidoscopy or colonoscopy.** / Every 5 years for a flexible sigmoidoscopy or every 10 years for a colonoscopy beginning at age 62 years and continuing until age 43 years.  Hepatitis C blood test.** / For all people born from [birth year] through 1963 and any  individual with known risks for hepatitis C.  Osteoporosis screening.** / A one-time screening for women ages 57 years and over and women at risk for fractures or osteoporosis.  Skin self-exam. / Monthly.  Influenza vaccine. / Every year.  Tetanus, diphtheria, and acellular pertussis (Tdap/Td) vaccine.** / 1 dose of Td every 10 years.  Varicella vaccine.** / Consult your health care provider.  Zoster vaccine.** / 1 dose for adults aged 25 years or older.  Pneumococcal 13-valent conjugate (PCV13) vaccine.** / Consult your health care provider.  Pneumococcal polysaccharide (PPSV23) vaccine.** / 1 dose for all adults aged 17 years and older.  Meningococcal vaccine.** / Consult your health care provider.  Hepatitis A vaccine.** / Consult your health care provider.  Hepatitis B vaccine.** / Consult your health care provider.  Haemophilus influenzae type b (Hib) vaccine.** / Consult your health care provider. ** Family history and personal history of risk and conditions may change your health care provider's recommendations.   This information is not intended to  replace advice given to you by your health care provider. Make sure you discuss any questions you have with your health care provider.   Document Released: 08/24/2001 Document Revised: 07/19/2014 Document Reviewed: 11/23/2010 Elsevier Interactive Patient Education [employer].

## 2016-04-21 MED FILL — HYDROXYZINE PAM 50 MG CAP: 50 | 8 days supply | Qty: 60 | Fill #0

## 2016-04-28 NOTE — Assessment & Plan Note (Signed)
Patient encouraged to maintain heart healthy diet, regular exercise, adequate sleep. Consider daily probiotics. Take medications as prescribed. Patient encouraged to maintain heart healthy diet, regular exercise, adequate sleep. Consider daily probiotics. Take medications as prescribed 

## 2016-04-28 NOTE — Assessment & Plan Note (Signed)
Tolerating statin, encouraged heart healthy diet, avoid trans fats, minimize simple carbs and saturated fats. Increase exercise as tolerated 

## 2016-04-28 NOTE — Progress Notes (Signed)
Patient ID: [name], female   DOB: 1955/12/19, 60 y.o.   MRN: [id number]   Subjective:    Patient ID: [name], female    DOB: 1956/05/16, 60 y.o.   MRN: [id number]  Chief Complaint  Patient presents with  . Annual Exam    HPI Patient is in today for annual preventative exam and follow up on numerous medical concerns. No recent hospitalization. She has noted sore throat, nasal congestion this week. Cough productive of green sputum. No obvious fevers, chilll, fatigue and fevers. Encouraged good sleep hygiene such as dark, quiet room. No blue/green glowing lights such as computer screens in bedroom. No alcohol or stimulants in evening. Cut down on caffeine as able. Regular exercise is helpful but not just prior to bed time.  Past Medical History:  Diagnosis Date  . Acute sinusitis 03/27/2013  . Allergy    seasonal  . Anxiety   . Arthritis 06/09/2013  . Depression   . Dyspareunia   . Edema extremities   . Encounter for preventative adult health care exam with abnormal findings 04/21/2014  . GERD (gastroesophageal reflux disease)   . Hip pain, left 11/01/2011  . History of proteinuria syndrome   . Hyperlipidemia   . Insomnia 03/11/2013  . Low back pain 05/09/2011  . Lymphadenitis 06/16/2012  . Myalgia and myositis 02/15/2015  . Obese   . Onychomycosis 05/09/2011  . Oral lesion 08/26/2013  . Otitis externa 06/23/2012   left  . Perimenopause 03/11/2013  . RLS (restless legs syndrome)   . RUQ pain 09/20/2011  . Sleep apnea 04/21/2014  . Unspecified constipation 05/13/2013  . Urinary frequency 08/26/2013    Past Surgical History:  Procedure Laterality Date  . BACK SURGERY  2006   low, discectomy for herniated disc  . BLADDER SUSPENSION N/A 11/07/2012   Procedure: [facility];  Surgeon: Malka So, MD;  Location: [facility];  Service: Urology;  Laterality: N/A;  . CARPAL TUNNEL RELEASE Right 2006  . CESAREAN SECTION  1993  . CYSTOSCOPY N/A 11/07/2012   Procedure: CYSTOSCOPY;  Surgeon: Malka So, MD;  Location: [facility];  Service: Urology;  Laterality: N/A;  . TUBAL LIGATION  1993    Family History  Problem Relation Age of Onset  . Cancer Mother 68    thyroid, malignant brain tumor  . Arthritis Mother     neck, back  . Hepatitis Sister     Hepatitis C  . Heart disease Sister     stent  . Diabetes Maternal Grandmother   . Arthritis Father     back pain  . Heart disease Father     stent    Social History   Social History  . Marital status: Married    Spouse name: N/A  . Number of children: N/A  . Years of education: N/A   Occupational History  . Not on file.   Social History Main Topics  . Smoking status: Former Smoker    Packs/day: 0.50    Years: 20.00    Types: Cigarettes    Quit date: 07/12/1992  . Smokeless tobacco: Never Used  . Alcohol use Yes     Comment: occasionaly  . Drug use: No  . Sexual activity: Yes    Partners: Male   Other Topics Concern  . Not on file   Social History Narrative  . No narrative on file    Outpatient Medications Prior to Visit  Medication Sig Dispense  Refill  . albuterol (PROVENTIL HFA;VENTOLIN HFA) 108 (90 BASE) MCG/ACT inhaler Inhale 2 puffs into the lungs every 6 (six) hours as needed for wheezing or shortness of breath. 1 Inhaler 2  . baclofen (LIORESAL) 20 MG tablet TAKE 1 TABLET (20 MG) BY MOUTH AT BEDTIME AS NEEDED FOR MUSCLE SPASMS. 90 tablet 5  . benzonatate (TESSALON) 100 MG capsule Take 1 capsule (100 mg total) by mouth 3 (three) times daily as needed. 21 capsule 0  . BIOTIN PO Take by mouth.    . Calcium Carbonate-Vitamin D (CALTRATE 600+D) 600-400 MG-UNIT per tablet Take 1 tablet by mouth daily.     . Cholecalciferol (VITAMIN D3) 1000 UNITS CAPS Take by mouth.      . conjugated estrogens (PREMARIN) vaginal cream Place vaginally as directed. 3 times a week at bedtime    . diazepam (VALIUM) 10 MG tablet Take 1 tablet (10 mg total) by mouth daily  as needed for anxiety. 40 tablet 1  . diazepam (VALIUM) 5 MG tablet Take 1 tablet by mouth daily.  1  . FLAXSEED, LINSEED, PO Take by mouth every other day.     . fluticasone (FLONASE) 50 MCG/ACT nasal spray Place 2 sprays into both nostrils daily. 16 g 6  . furosemide (LASIX) 40 MG tablet 1 tab po bid x 3 days then 1 tab po daily 120 tablet 1  . gabapentin (NEURONTIN) 300 MG capsule Take 1 capsule (300 mg total) by mouth 2 (two) times daily. 60 capsule 3  . HYDROcodone-acetaminophen (NORCO) 7.5-325 MG tablet Take 1 tablet by mouth every 6 (six) hours as needed for moderate pain. 30 tablet 0  . hydrOXYzine (ATARAX/VISTARIL) 50 MG tablet Take 50 mg by mouth every 6 (six) hours as needed.    . lamoTRIgine (LAMICTAL) 200 MG tablet Take 200 mg by mouth daily.      . metolazone (ZAROXOLYN) 2.5 MG tablet Take 1 tablet (2.5 mg total) by mouth 2 (two) times daily as needed. 30 tablet 2  . Nutritional Supplements (ESTROVEN PO) Take by mouth.    Marland Kitchen omeprazole (PRILOSEC) 40 MG capsule Take 1 capsule (40 mg total) by mouth daily. 30 capsule 3  . potassium chloride SA (K-DUR,KLOR-CON) 20 MEQ tablet Take 1 tablet (20 mEq total) by mouth daily. 90 tablet 1  . promethazine (PHENERGAN) 25 MG tablet Take 1 tablet (25 mg total) by mouth every 8 (eight) hours as needed for nausea. 40 tablet 5  . ranitidine (ZANTAC) 300 MG capsule Take 1 capsule (300 mg total) by mouth every evening. 30 capsule 5  . ranitidine (ZANTAC) 300 MG tablet TAKE 1 TABLET BY MOUTH AT BEDTIME 30 tablet 3  . rOPINIRole (REQUIP) 1 MG tablet Take 1 mg by mouth daily.      . baclofen (LIORESAL) 20 MG tablet   4  . pantoprazole (PROTONIX) 40 MG tablet Take 1 tablet (40 mg total) by mouth daily. 90 tablet 1  . simvastatin (ZOCOR) 40 MG tablet TAKE 1 TABLET (40 MG TOTAL) BY MOUTH DAILY. 30 tablet 5   No facility-administered medications prior to visit.     Allergies  Allergen Reactions  . Carbamazepine Hives  . Ceclor [Cefaclor]     Tongue  swell  . Morphine And Related Itching  . Atorvastatin Other (See Comments)    Leg pain    Review of Systems  Constitutional: Positive for malaise/fatigue. Negative for fever.  HENT: Positive for congestion.   Eyes: Negative for blurred vision.  Respiratory: Positive  for shortness of breath. Negative for cough.   Cardiovascular: Negative for chest pain, palpitations and leg swelling.  Gastrointestinal: Negative for abdominal pain, blood in stool and nausea.  Genitourinary: Negative for dysuria and frequency.  Musculoskeletal: Positive for back pain, joint pain and myalgias. Negative for falls.  Skin: Negative for rash.  Neurological: Negative for dizziness, loss of consciousness and headaches.  Endo/Heme/Allergies: Negative for environmental allergies.  Psychiatric/Behavioral: Negative for depression. The patient is not nervous/anxious.        Objective:    Physical Exam  Constitutional: She is oriented to person, place, and time. She appears well-developed and well-nourished. No distress.  HENT:  Head: Normocephalic and atraumatic.  Eyes: Conjunctivae are normal.  Neck: Neck supple. No thyromegaly present.  Cardiovascular: Normal rate, regular rhythm and normal heart sounds.   No murmur heard. Pulmonary/Chest: Effort normal and breath sounds normal. No respiratory distress.  Abdominal: Soft. Bowel sounds are normal. She exhibits no distension and no mass. There is no tenderness.  Musculoskeletal: She exhibits no edema.  Lymphadenopathy:    She has no cervical adenopathy.  Neurological: She is alert and oriented to person, place, and time.  Skin: Skin is warm and dry.  Psychiatric: She has a normal mood and affect. Her behavior is normal.    BP 108/72 (BP Location: Left Arm, Patient Position: Standing, Cuff Size: Large)   Pulse 80   Temp 98.1 F (36.7 C) (Oral)   Ht 5\' 5"  (1.651 m)   Wt 203 lb 4 oz (92.2 kg)   LMP 10/03/2012   SpO2 98%   BMI 33.82 kg/m  Wt Readings  from Last 3 Encounters:  04/20/16 203 lb 4 oz (92.2 kg)  03/09/16 201 lb 9.6 oz (91.4 kg)  08/20/15 184 lb 9.6 oz (83.7 kg)     Lab Results  Component Value Date   WBC 7.4 03/09/2016   HGB 13.6 03/09/2016   HCT 40.7 03/09/2016   PLT 256.0 03/09/2016   GLUCOSE 84 03/09/2016   CHOL 194 03/09/2016   TRIG 251.0 (H) 03/09/2016   HDL 49.80 03/09/2016   LDLDIRECT 105.0 03/09/2016   LDLCALC 117 (H) 04/18/2014   ALT 24 03/09/2016   AST 18 03/09/2016   NA 140 03/09/2016   K 4.4 03/09/2016   CL 108 03/09/2016   CREATININE 0.82 03/09/2016   BUN 12 03/09/2016   CO2 29 03/09/2016   TSH 0.55 03/09/2016   HGBA1C 5.3 04/20/2016    Lab Results  Component Value Date   TSH 0.55 03/09/2016   Lab Results  Component Value Date   WBC 7.4 03/09/2016   HGB 13.6 03/09/2016   HCT 40.7 03/09/2016   MCV 85.6 03/09/2016   PLT 256.0 03/09/2016   Lab Results  Component Value Date   NA 140 03/09/2016   K 4.4 03/09/2016   CO2 29 03/09/2016   GLUCOSE 84 03/09/2016   BUN 12 03/09/2016   CREATININE 0.82 03/09/2016   BILITOT 0.5 03/09/2016   ALKPHOS 61 03/09/2016   AST 18 03/09/2016   ALT 24 03/09/2016   PROT 6.7 03/09/2016   ALBUMIN 4.2 03/09/2016   CALCIUM 9.1 03/09/2016   ANIONGAP 13 01/22/2015   GFR 76.06 03/09/2016   Lab Results  Component Value Date   CHOL 194 03/09/2016   Lab Results  Component Value Date   HDL 49.80 03/09/2016   Lab Results  Component Value Date   LDLCALC 117 (H) 04/18/2014   Lab Results  Component Value Date  TRIG 251.0 (H) 03/09/2016   Lab Results  Component Value Date   CHOLHDL 4 03/09/2016   Lab Results  Component Value Date   HGBA1C 5.3 04/20/2016       Assessment & Plan:   Problem List Items Addressed This Visit    Hyperlipidemia    Tolerating statin, encouraged heart healthy diet, avoid trans fats, minimize simple carbs and saturated fats. Increase exercise as tolerated      Relevant Medications   rosuvastatin (CRESTOR) 20 MG  tablet   Other Relevant Orders   TSH   CBC   Comprehensive metabolic panel   Edema extremities   Relevant Orders   TSH   CBC   Comprehensive metabolic panel   Preventative health care    Patient encouraged to maintain heart healthy diet, regular exercise, adequate sleep. Consider daily probiotics. Take medications as prescribed. Patient encouraged to maintain heart healthy diet, regular exercise, adequate sleep. Consider daily probiotics. Take medications as prescribed      Relevant Orders   TSH   CBC   Hemoglobin A1c   Comprehensive metabolic panel   Hyperglycemia - Primary     minimize simple carbs. Increase exercise as tolerated.       Relevant Orders   Hemoglobin A1c (Completed)   TSH   CBC   Hemoglobin A1c   Comprehensive metabolic panel    Other Visit Diagnoses    Cervical cancer screening       Relevant Orders   Ambulatory referral to Gynecology   Hyperlipidemia, mixed       Relevant Medications   rosuvastatin (CRESTOR) 20 MG tablet   Other Relevant Orders   Lipid panel   Comprehensive metabolic panel   Encounter for immunization       Relevant Orders   Flu Vaccine QUAD 36+ mos IM (Completed)      I have discontinued Ms. Rohrig's pantoprazole, simvastatin, and atorvastatin. I have also changed her rosuvastatin. Additionally, I am having her start on amoxicillin-clavulanate. Lastly, I am having her maintain her lamoTRIgine, rOPINIRole, Calcium Carbonate-Vitamin D, (FLAXSEED, LINSEED, PO), Vitamin D3, BIOTIN PO, Nutritional Supplements (ESTROVEN PO), conjugated estrogens, hydrOXYzine, promethazine, albuterol, fluticasone, ranitidine, benzonatate, baclofen, diazepam, furosemide, metolazone, ranitidine, omeprazole, diazepam, gabapentin, potassium chloride SA, and HYDROcodone-acetaminophen.  Meds ordered this encounter  Medications  . DISCONTD: atorvastatin (LIPITOR) 40 MG tablet    Sig: Take 1 tablet (40 mg total) by mouth daily.    Dispense:  30 tablet     Refill:  5  . DISCONTD: rosuvastatin (CRESTOR) 20 MG tablet    Sig: Take 1 tablet (20 mg total) by mouth daily.    Dispense:  30 tablet    Refill:  5  . rosuvastatin (CRESTOR) 20 MG tablet    Sig: Take 1 tablet (20 mg total) by mouth daily. Failed Atorvastatin and Simvastatin    Dispense:  30 tablet    Refill:  5  . amoxicillin-clavulanate (AUGMENTIN) 875-125 MG tablet    Sig: Take 1 tablet by mouth 2 (two) times daily.    Dispense:  20 tablet    Refill:  0     Penni Homans, MD

## 2016-04-28 NOTE — Assessment & Plan Note (Signed)
minimize simple carbs. Increase exercise as tolerated.  

## 2016-05-03 ENCOUNTER — Other Ambulatory Visit: Payer: Self-pay | Admitting: Medical

## 2016-05-03 MED FILL — raNITIdine HCL 300 MG TABS: 300 | 30 days supply | Qty: 30 | Fill #2

## 2016-05-03 MED FILL — OMEPRAZOLE DR 40 MG CAPSULE: 40 | 30 days supply | Qty: 30 | Fill #2

## 2016-05-04 MED FILL — FLUTICASONE PROP 50 MCG SPR: 50 | 30 days supply | Qty: 16 | Fill #0

## 2016-05-12 MED FILL — lamoTRIgine 150 MG TABS: 150 | 30 days supply | Qty: 60 | Fill #2

## 2016-05-14 MED FILL — ROSUVASTATIN CALCIUM 20 MG: 20 | 30 days supply | Qty: 30 | Fill #1

## 2016-05-20 ENCOUNTER — Ambulatory Visit: Payer: Medicare Other | Admitting: Gastroenterology

## 2016-05-24 MED FILL — hydrOXYzine HCL 50 MG TABS: 50 | 12 days supply | Qty: 100 | Fill #0

## 2016-05-24 MED FILL — POTASSIUM CL ER 20 MEQ TABL: 20 | 90 days supply | Qty: 90 | Fill #1

## 2016-05-27 MED FILL — HYDROXYZINE PAM 50 MG CAP: 50 | 8 days supply | Qty: 60 | Fill #0

## 2016-05-27 MED FILL — OMEPRAZOLE DR 40 MG CAPSULE: 40 | 30 days supply | Qty: 30 | Fill #3

## 2016-06-14 MED FILL — ROSUVASTATIN CALCIUM 20 MG: 20 | 30 days supply | Qty: 30 | Fill #2

## 2016-06-21 MED FILL — lamoTRIgine 150 MG TABS: 150 | 90 days supply | Qty: 180 | Fill #0

## 2016-06-21 MED FILL — HYDROXYZINE PAM 50 MG CAP: 50 | 8 days supply | Qty: 60 | Fill #1

## 2016-06-29 ENCOUNTER — Other Ambulatory Visit: Payer: Self-pay | Admitting: Family Medicine

## 2016-06-29 MED FILL — OMEPRAZOLE DR 40 MG CAPSULE: 40 | 30 days supply | Qty: 30 | Fill #0

## 2016-06-29 MED FILL — diazePAM 10 MG TABS: 10 | 40 days supply | Qty: 40 | Fill #1

## 2016-07-08 MED FILL — ROSUVASTATIN CALCIUM 20 MG: 20 | 30 days supply | Qty: 30 | Fill #3

## 2016-07-15 MED FILL — HYDROXYZINE PAM 50 MG CAP: 50 | 8 days supply | Qty: 60 | Fill #2

## 2016-07-16 ENCOUNTER — Other Ambulatory Visit (INDEPENDENT_AMBULATORY_CARE_PROVIDER_SITE_OTHER): Payer: Medicare Other

## 2016-07-16 DIAGNOSIS — R739 Hyperglycemia, unspecified: Secondary | ICD-10-CM

## 2016-07-16 DIAGNOSIS — Z0000 Encounter for general adult medical examination without abnormal findings: Secondary | ICD-10-CM | POA: Diagnosis not present

## 2016-07-16 DIAGNOSIS — E782 Mixed hyperlipidemia: Secondary | ICD-10-CM

## 2016-07-16 DIAGNOSIS — R600 Localized edema: Secondary | ICD-10-CM

## 2016-07-16 LAB — CBC
HEMATOCRIT: 41.7 % (ref 36.0–46.0)
Hemoglobin: 14.1 g/dL (ref 12.0–15.0)
MCHC: 33.8 g/dL (ref 30.0–36.0)
MCV: 84.6 fl (ref 78.0–100.0)
PLATELETS: 244 10*3/uL (ref 150.0–400.0)
RBC: 4.93 Mil/uL (ref 3.87–5.11)
RDW: 13.3 % (ref 11.5–15.5)
WBC: 6.5 10*3/uL (ref 4.0–10.5)

## 2016-07-16 LAB — COMPREHENSIVE METABOLIC PANEL
ALT: 19 U/L (ref 0–35)
AST: 15 U/L (ref 0–37)
Albumin: 4.6 g/dL (ref 3.5–5.2)
Alkaline Phosphatase: 75 U/L (ref 39–117)
BUN: 16 mg/dL (ref 6–23)
CALCIUM: 9.4 mg/dL (ref 8.4–10.5)
CO2: 28 meq/L (ref 19–32)
CREATININE: 0.88 mg/dL (ref 0.40–1.20)
Chloride: 106 mEq/L (ref 96–112)
GFR: 70.02 mL/min (ref >60.00)
Glucose, Bld: 92 mg/dL (ref 70–99)
POTASSIUM: 4 meq/L (ref 3.5–5.1)
SODIUM: 142 meq/L (ref 135–145)
Total Bilirubin: 0.6 mg/dL (ref 0.2–1.2)
Total Protein: 7.1 g/dL (ref 6.0–8.3)

## 2016-07-16 LAB — LIPID PANEL
CHOL/HDL RATIO: 3
Cholesterol: 169 mg/dL (ref 0–200)
HDL: 54.9 mg/dL (ref >39.00)
NonHDL: 114.13
Triglycerides: 268 mg/dL — ABNORMAL HIGH (ref 0.0–149.0)
VLDL: 53.6 mg/dL — AB (ref 0.0–40.0)

## 2016-07-16 LAB — HEMOGLOBIN A1C: Hgb A1c MFr Bld: 5.3 % (ref 4.6–6.5)

## 2016-07-16 LAB — LDL CHOLESTEROL, DIRECT: Direct LDL: 83 mg/dL

## 2016-07-16 LAB — TSH: TSH: 1.22 u[IU]/mL (ref 0.35–4.50)

## 2016-07-20 ENCOUNTER — Encounter: Payer: Self-pay | Admitting: Family Medicine

## 2016-07-20 ENCOUNTER — Ambulatory Visit (INDEPENDENT_AMBULATORY_CARE_PROVIDER_SITE_OTHER): Payer: Medicare Other | Admitting: Family Medicine

## 2016-07-20 VITALS — BP 112/70 | HR 78 | Temp 97.9°F | Wt 202.6 lb

## 2016-07-20 DIAGNOSIS — J209 Acute bronchitis, unspecified: Secondary | ICD-10-CM

## 2016-07-20 DIAGNOSIS — E782 Mixed hyperlipidemia: Secondary | ICD-10-CM | POA: Diagnosis not present

## 2016-07-20 DIAGNOSIS — R739 Hyperglycemia, unspecified: Secondary | ICD-10-CM

## 2016-07-20 DIAGNOSIS — R110 Nausea: Secondary | ICD-10-CM | POA: Diagnosis not present

## 2016-07-20 DIAGNOSIS — K5900 Constipation, unspecified: Secondary | ICD-10-CM

## 2016-07-20 DIAGNOSIS — G4700 Insomnia, unspecified: Secondary | ICD-10-CM | POA: Diagnosis not present

## 2016-07-20 DIAGNOSIS — K219 Gastro-esophageal reflux disease without esophagitis: Secondary | ICD-10-CM

## 2016-07-20 MED ORDER — PROMETHAZINE HCL 25 MG PO TABS
25.0000 mg | ORAL_TABLET | Freq: Three times a day (TID) | ORAL | 5 refills | Status: DC | PRN
Start: 2016-07-20 — End: 2016-07-20

## 2016-07-20 MED ORDER — CHOLESTYRAMINE 4 G PO PACK: 4.0000 g | PACK | Freq: Two times a day (BID) | ORAL | 3 refills | Status: DC

## 2016-07-20 MED ORDER — POTASSIUM CHLORIDE CRYS ER 20 MEQ PO TBCR: 20.0000 meq | EXTENDED_RELEASE_TABLET | Freq: Every day | ORAL | 1 refills | Status: DC

## 2016-07-20 MED ORDER — HYDROCODONE-ACETAMINOPHEN 7.5-325 MG PO TABS: 1.0000 | ORAL_TABLET | Freq: Four times a day (QID) | ORAL | 0 refills | Status: DC | PRN

## 2016-07-20 MED ORDER — DIAZEPAM 10 MG PO TABS: 10.0000 mg | ORAL_TABLET | Freq: Every day | ORAL | 1 refills | Status: DC | PRN

## 2016-07-20 MED ORDER — PROMETHAZINE HCL 25 MG PO TABS: 25.0000 mg | ORAL_TABLET | Freq: Three times a day (TID) | ORAL | 1 refills | Status: DC | PRN

## 2016-07-20 MED FILL — PROMETHAZINE 25 MG TABLET: 25 | 13 days supply | Qty: 40 | Fill #0

## 2016-07-20 MED FILL — HYDROCODON-APAP 7.5-325: 7.5-325 | 7 days supply | Qty: 30 | Fill #0

## 2016-07-20 MED FILL — CHOLESTYRAMINE PACKET: 4 | 30 days supply | Qty: 60 | Fill #0

## 2016-07-20 NOTE — Progress Notes (Signed)
Subjective:    Patient ID: [name], female    DOB: August 30, 1955, 61 y.o.   MRN: [id number]  Chief Complaint  Patient presents with  . Follow-up    85-month  F/U (Hyperglycemia).    HPI Patient is in today for a 3 month follow for high cholesterol. No acute concerns noted.  Past Medical History:  Diagnosis Date  . Acute sinusitis 03/27/2013  . Allergy    seasonal  . Anxiety   . Arthritis 06/09/2013  . Depression   . Dyspareunia   . Edema extremities   . Encounter for preventative adult health care exam with abnormal findings 04/21/2014  . GERD (gastroesophageal reflux disease)   . Hip pain, left 11/01/2011  . History of proteinuria syndrome   . Hyperlipidemia   . Insomnia 03/11/2013  . Low back pain 05/09/2011  . Lymphadenitis 06/16/2012  . Myalgia and myositis 02/15/2015  . Obese   . Onychomycosis 05/09/2011  . Oral lesion 08/26/2013  . Otitis externa 06/23/2012   left  . Perimenopause 03/11/2013  . RLS (restless legs syndrome)   . RUQ pain 09/20/2011  . Sleep apnea 04/21/2014  . Unspecified constipation 05/13/2013  . Urinary frequency 08/26/2013    Past Surgical History:  Procedure Laterality Date  . BACK SURGERY  2006   low, discectomy for herniated disc  . BLADDER SUSPENSION N/A 11/07/2012   Procedure: [facility] SLING;  Surgeon: Malka So, MD;  Location: [facility];  Service: Urology;  Laterality: N/A;  . CARPAL TUNNEL RELEASE Right 2006  . CESAREAN SECTION  1993  . CYSTOSCOPY N/A 11/07/2012   Procedure: CYSTOSCOPY;  Surgeon: Malka So, MD;  Location: [facility];  Service: Urology;  Laterality: N/A;  . TUBAL LIGATION  1993    Family History  Problem Relation Age of Onset  . Cancer Mother 64    thyroid, malignant brain tumor  . Arthritis Mother     neck, back  . Hepatitis Sister     Hepatitis C  . Heart disease Sister     stent  . Diabetes Maternal Grandmother   . Arthritis Father     back pain  . Heart disease  Father     stent    Social History   Social History  . Marital status: Married    Spouse name: N/A  . Number of children: N/A  . Years of education: N/A   Occupational History  . Not on file.   Social History Main Topics  . Smoking status: Former Smoker    Packs/day: 0.50    Years: 20.00    Types: Cigarettes    Quit date: 07/12/1992  . Smokeless tobacco: Never Used  . Alcohol use Yes     Comment: occasionaly  . Drug use: No  . Sexual activity: Yes    Partners: Male   Other Topics Concern  . Not on file   Social History Narrative  . No narrative on file    Outpatient Medications Prior to Visit  Medication Sig Dispense Refill  . albuterol (PROVENTIL HFA;VENTOLIN HFA) 108 (90 BASE) MCG/ACT inhaler Inhale 2 puffs into the lungs every 6 (six) hours as needed for wheezing or shortness of breath. 1 Inhaler 2  . amoxicillin-clavulanate (AUGMENTIN) 875-125 MG tablet Take 1 tablet by mouth 2 (two) times daily. 20 tablet 0  . baclofen (LIORESAL) 20 MG tablet TAKE 1 TABLET (20 MG) BY MOUTH AT BEDTIME AS NEEDED FOR MUSCLE SPASMS. 90 tablet  5  . benzonatate (TESSALON) 100 MG capsule Take 1 capsule (100 mg total) by mouth 3 (three) times daily as needed. 21 capsule 0  . BIOTIN PO Take by mouth.    . Calcium Carbonate-Vitamin D (CALTRATE 600+D) 600-400 MG-UNIT per tablet Take 1 tablet by mouth daily.     . Cholecalciferol (VITAMIN D3) 1000 UNITS CAPS Take by mouth.      . conjugated estrogens (PREMARIN) vaginal cream Place vaginally as directed. 3 times a week at bedtime    . diazepam (VALIUM) 10 MG tablet Take 1 tablet (10 mg total) by mouth daily as needed for anxiety. 40 tablet 1  . FLAXSEED, LINSEED, PO Take by mouth every other day.     . fluticasone (FLONASE) 50 MCG/ACT nasal spray PLACE 2 SPRAYS INTO BOTH NOSTRILS DAILY. 16 g 6  . furosemide (LASIX) 40 MG tablet 1 tab po bid x 3 days then 1 tab po daily 120 tablet 1  . gabapentin (NEURONTIN) 300 MG capsule Take 1 capsule (300 mg  total) by mouth 2 (two) times daily. 60 capsule 3  . HYDROcodone-acetaminophen (NORCO) 7.5-325 MG tablet Take 1 tablet by mouth every 6 (six) hours as needed for moderate pain. 30 tablet 0  . hydrOXYzine (ATARAX/VISTARIL) 50 MG tablet Take 50 mg by mouth every 6 (six) hours as needed.    . lamoTRIgine (LAMICTAL) 200 MG tablet Take 200 mg by mouth daily.      . metolazone (ZAROXOLYN) 2.5 MG tablet Take 1 tablet (2.5 mg total) by mouth 2 (two) times daily as needed. 30 tablet 2  . Nutritional Supplements (ESTROVEN PO) Take by mouth.    Marland Kitchen omeprazole (PRILOSEC) 40 MG capsule TAKE 1 CAPSULE (40 MG TOTAL) BY MOUTH DAILY. 30 capsule 3  . potassium chloride SA (K-DUR,KLOR-CON) 20 MEQ tablet Take 1 tablet (20 mEq total) by mouth daily. 90 tablet 1  . promethazine (PHENERGAN) 25 MG tablet Take 1 tablet (25 mg total) by mouth every 8 (eight) hours as needed for nausea. 40 tablet 5  . ranitidine (ZANTAC) 300 MG capsule Take 1 capsule (300 mg total) by mouth every evening. 30 capsule 5  . ranitidine (ZANTAC) 300 MG tablet TAKE 1 TABLET BY MOUTH AT BEDTIME 30 tablet 3  . rOPINIRole (REQUIP) 1 MG tablet Take 1 mg by mouth daily.      . rosuvastatin (CRESTOR) 20 MG tablet Take 1 tablet (20 mg total) by mouth daily. Failed Atorvastatin and Simvastatin 30 tablet 5  . diazepam (VALIUM) 5 MG tablet Take 1 tablet by mouth daily.  1   No facility-administered medications prior to visit.     Allergies  Allergen Reactions  . Carbamazepine Hives  . Ceclor [Cefaclor]     Tongue swell  . Morphine And Related Itching  . Atorvastatin Other (See Comments)    Leg pain    Review of Systems  Constitutional: Negative for fever and malaise/fatigue.  HENT: Negative for congestion.   Eyes: Negative for blurred vision.  Respiratory: Negative for cough and shortness of breath.   Cardiovascular: Negative for chest pain, palpitations and leg swelling.  Gastrointestinal: Negative for vomiting.  Musculoskeletal: Negative  for back pain.  Skin: Negative for rash.  Neurological: Negative for loss of consciousness and headaches.       Objective:    Physical Exam  Constitutional: She is oriented to person, place, and time. She appears well-developed and well-nourished. No distress.  HENT:  Head: Normocephalic and atraumatic.  Eyes: Conjunctivae are  normal.  Neck: Normal range of motion. No thyromegaly present.  Cardiovascular: Normal rate and regular rhythm.   Pulmonary/Chest: Effort normal and breath sounds normal. She has no wheezes.  Abdominal: Soft. Bowel sounds are normal. There is no tenderness.  Musculoskeletal: She exhibits no edema or deformity.  Neurological: She is alert and oriented to person, place, and time.  Skin: Skin is warm and dry. She is not diaphoretic.  Psychiatric: She has a normal mood and affect.    LMP 10/03/2012  Wt Readings from Last 3 Encounters:  04/20/16 203 lb 4 oz (92.2 kg)  03/09/16 201 lb 9.6 oz (91.4 kg)  08/20/15 184 lb 9.6 oz (83.7 kg)     Lab Results  Component Value Date   WBC 6.5 07/16/2016   HGB 14.1 07/16/2016   HCT 41.7 07/16/2016   PLT 244.0 07/16/2016   GLUCOSE 92 07/16/2016   CHOL 169 07/16/2016   TRIG 268.0 (H) 07/16/2016   HDL 54.90 07/16/2016   LDLDIRECT 83.0 07/16/2016   LDLCALC 117 (H) 04/18/2014   ALT 19 07/16/2016   AST 15 07/16/2016   NA 142 07/16/2016   K 4.0 07/16/2016   CL 106 07/16/2016   CREATININE 0.88 07/16/2016   BUN 16 07/16/2016   CO2 28 07/16/2016   TSH 1.22 07/16/2016   HGBA1C 5.3 07/16/2016    Lab Results  Component Value Date   TSH 1.22 07/16/2016   Lab Results  Component Value Date   WBC 6.5 07/16/2016   HGB 14.1 07/16/2016   HCT 41.7 07/16/2016   MCV 84.6 07/16/2016   PLT 244.0 07/16/2016   Lab Results  Component Value Date   NA 142 07/16/2016   K 4.0 07/16/2016   CO2 28 07/16/2016   GLUCOSE 92 07/16/2016   BUN 16 07/16/2016   CREATININE 0.88 07/16/2016   BILITOT 0.6 07/16/2016   ALKPHOS 75  07/16/2016   AST 15 07/16/2016   ALT 19 07/16/2016   PROT 7.1 07/16/2016   ALBUMIN 4.6 07/16/2016   CALCIUM 9.4 07/16/2016   ANIONGAP 13 01/22/2015   GFR 70.02 07/16/2016   Lab Results  Component Value Date   CHOL 169 07/16/2016   Lab Results  Component Value Date   HDL 54.90 07/16/2016   Lab Results  Component Value Date   LDLCALC 117 (H) 04/18/2014   Lab Results  Component Value Date   TRIG 268.0 (H) 07/16/2016   Lab Results  Component Value Date   CHOLHDL 3 07/16/2016   Lab Results  Component Value Date   HGBA1C 5.3 07/16/2016      I acted as a Education administrator for Dr. Charlett Blake. [place], [place]  Assessment & Plan:   Problem List Items Addressed This Visit      Other   Insomnia   Nausea    Other Visit Diagnoses    Acute bronchitis, unspecified organism          I am having Ms. Delira maintain her lamoTRIgine, rOPINIRole, Calcium Carbonate-Vitamin D, (FLAXSEED, LINSEED, PO), Vitamin D3, BIOTIN PO, Nutritional Supplements (ESTROVEN PO), conjugated estrogens, hydrOXYzine, promethazine, albuterol, ranitidine, benzonatate, baclofen, furosemide, metolazone, ranitidine, diazepam, gabapentin, potassium chloride SA, HYDROcodone-acetaminophen, rosuvastatin, amoxicillin-clavulanate, fluticasone, and omeprazole.  No orders of the defined types were placed in this encounter.    Shan Levans, CMA

## 2016-07-20 NOTE — Progress Notes (Signed)
Patient ID: [name], female   DOB: 03/25/1956, 61 y.o.   MRN: [id number]

## 2016-07-20 NOTE — Progress Notes (Signed)
Pre visit review using our clinic review tool, if applicable. No additional management support is needed unless otherwise documented below in the visit note. 

## 2016-07-20 NOTE — Patient Instructions (Signed)

## 2016-07-21 NOTE — Assessment & Plan Note (Signed)
hgba1c acceptable, minimize simple carbs. Increase exercise as tolerated.  

## 2016-07-21 NOTE — Assessment & Plan Note (Signed)
Tolerating statin, encouraged heart healthy diet, avoid trans fats, minimize simple carbs and saturated fats. Increase exercise as tolerated 

## 2016-07-22 ENCOUNTER — Encounter: Payer: Medicare Other | Admitting: Obstetrics & Gynecology

## 2016-07-25 IMAGING — MR MR LUMBAR SPINE WO/W CM
4 of 7 series · 25 of 48 positions shown · IV contrast (18ml Multihance)
Comparison: 05/31/2012

CLINICAL DATA: Lumbar radiculopathy. Low back pain radiating into
the bilateral thighs and knees. Numbness in the thighs posteriorly
and occasionally in the left foot. Bilateral leg weakness. Prior
lumbar surgery in 0336. Current symptoms began in 6011 and have
progressively worsened. Multiple falls this year with worsening
symptoms.

EXAM:
MRI LUMBAR SPINE WITHOUT AND WITH CONTRAST
TECHNIQUE: Multiplanar and multiecho pulse sequences of the lumbar spine were
obtained without and with intravenous contrast.
CONTRAST:  18mL MULTIHANCE GADOBENATE DIMEGLUMINE 529 MG/ML IV SOLN

[Series 4: T1 · sagittal · 4.0mm · 0.55mm/px · 5 of 12 slices shown (1 of 2)]
[im 1/12]
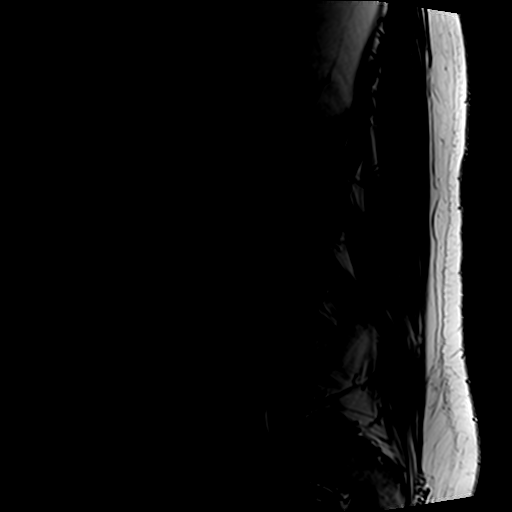
[im 3/12]
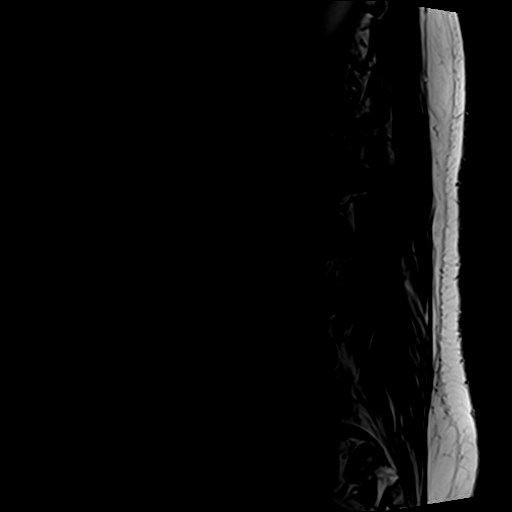
[im 6/12]
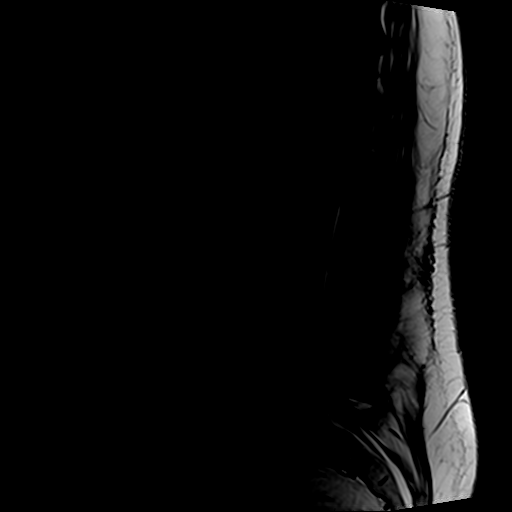
[im 9/12]
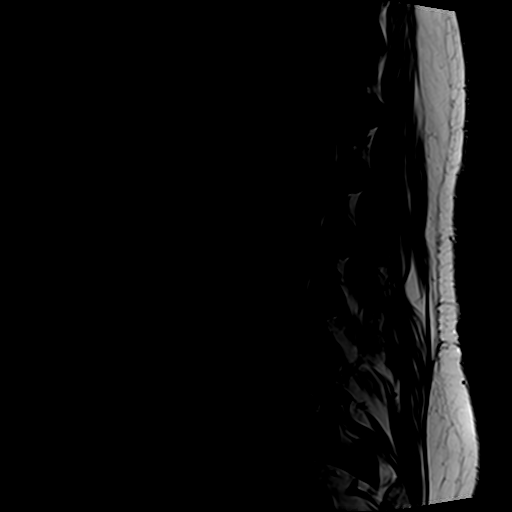
[im 12/12]
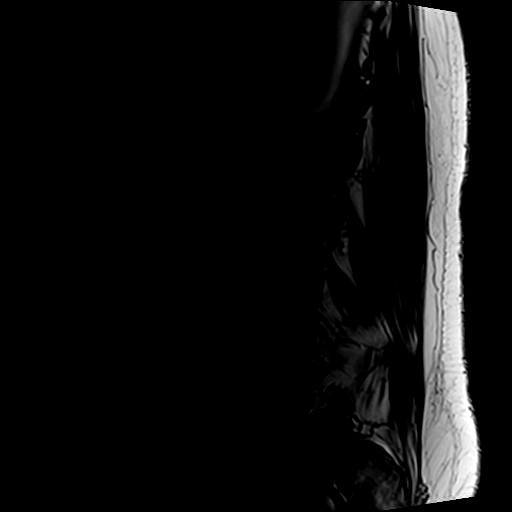

[Series 5: T2 · axial · 4.0mm · 0.70mm/px · z∈[-15,+165]mm · 8 of 30 slices shown]
[im 1/30]
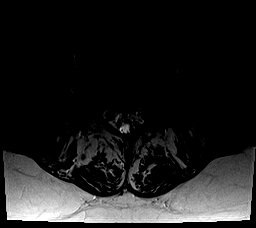
[im 4/30]
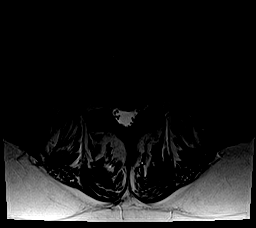
[im 10/30]
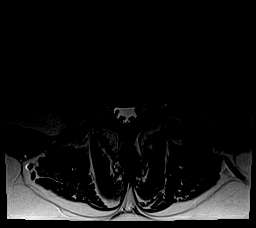
[im 13/30]
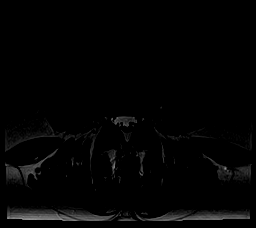
[im 17/30]
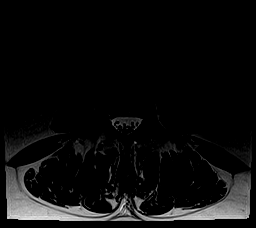
[im 20/30]
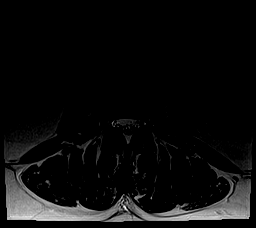
[im 26/30]
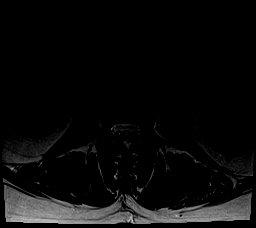
[im 30/30]
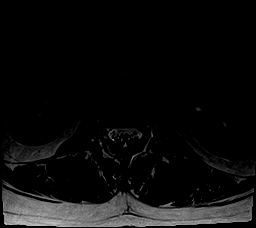

[Series 6: T1 · axial · 4.0mm · 0.35mm/px · z∈[-15,+165]mm · 8 of 30 slices shown (2 of 2)]
[im 1/30]
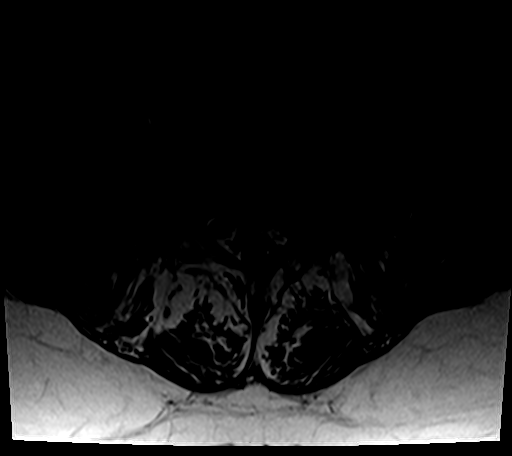
[im 4/30]
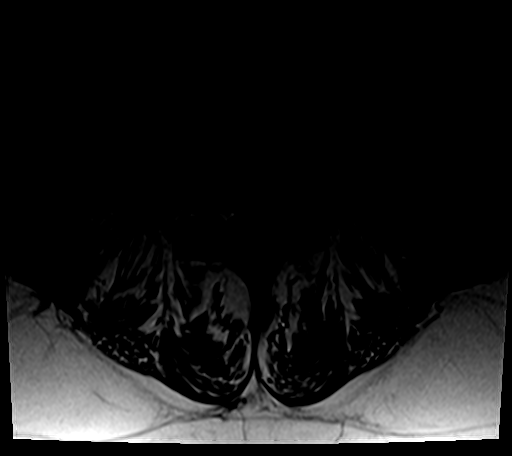
[im 10/30]
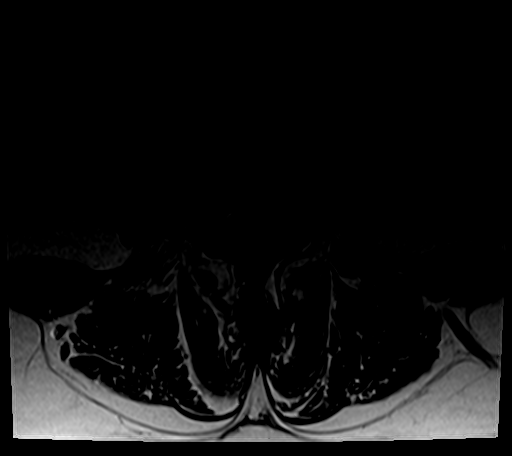
[im 13/30]
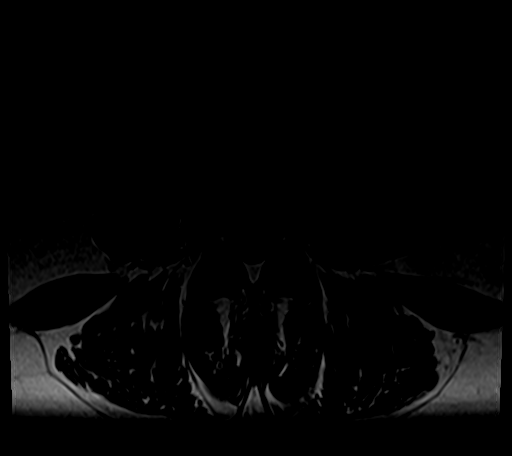
[im 17/30]
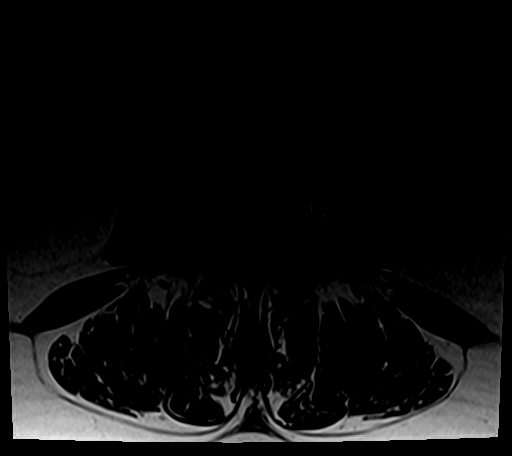
[im 20/30]
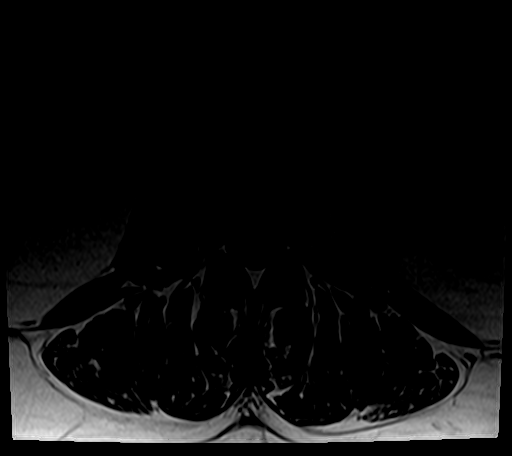
[im 26/30]
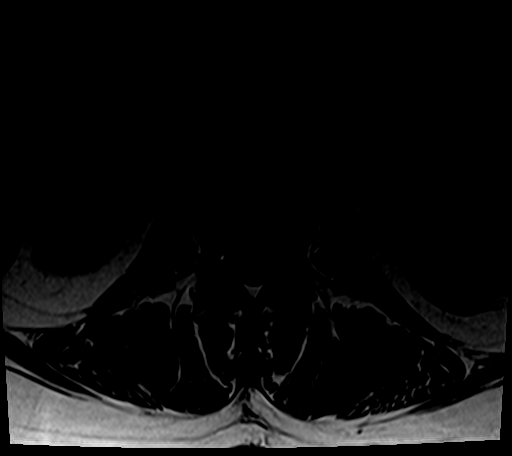
[im 30/30]
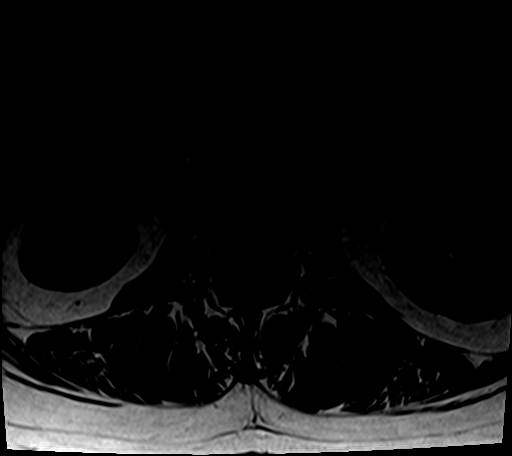

[Series 7: T2 post-contrast · sagittal · 4.0mm · 0.55mm/px · 4 of 12 slices shown]
[im 1/12]
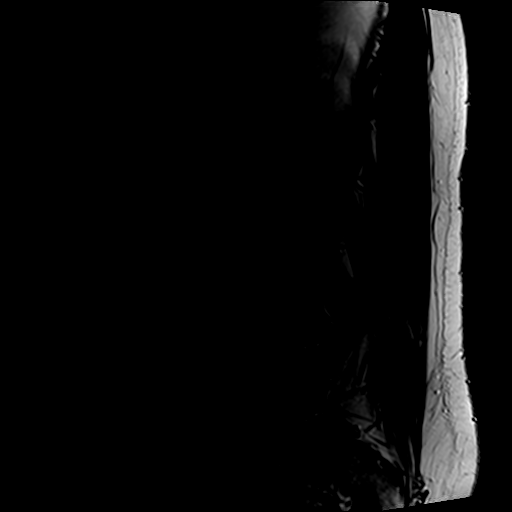
[im 4/12]
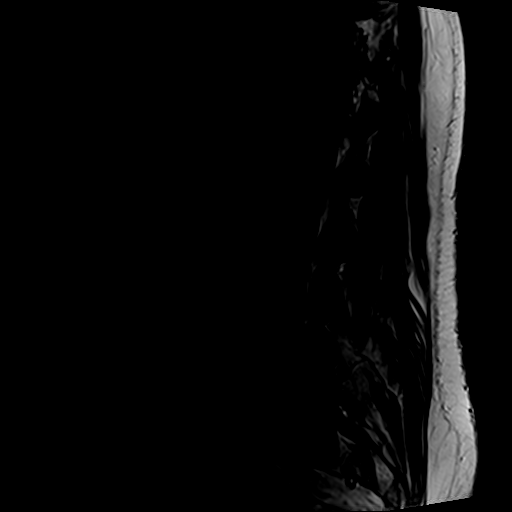
[im 8/12]
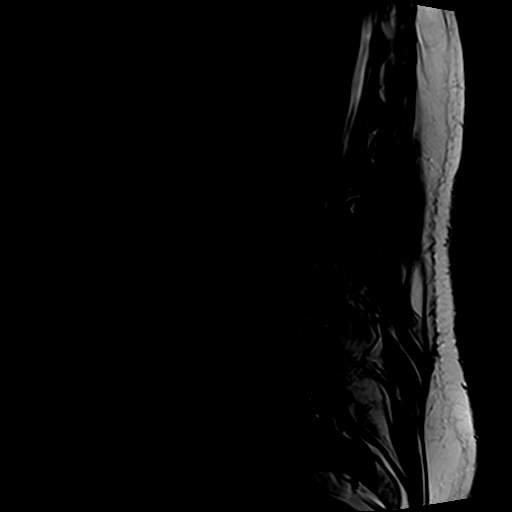
[im 12/12]
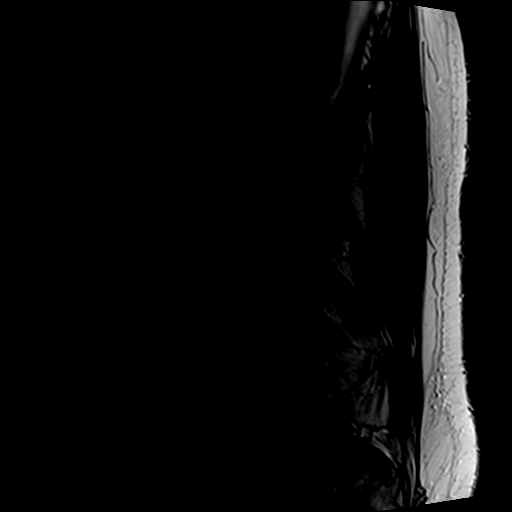

[25 of 48 positions shown; findings below may reference images not displayed]

FINDINGS: Vertebral alignment is normal. Vertebral body heights are preserved.
There is moderate disc space narrowing at L5-S1, similar to prior.
Mild discogenic marrow edema at L5-S1 is similar to prior. Lumbar
disc space heights are preserved elsewhere with only mild
desiccation, most notably at L2-3. Conus medullaris is normal in
signal and terminates at L1. Paraspinal soft tissues are
unremarkable.

L1-2:  Negative.

L2-3:  Mild facet arthrosis without stenosis.

L3-4: Mild facet arthrosis without stenosis.

L4-5:  Mild facet arthrosis without stenosis.

L5-S1: Prior left hemilaminectomy is again noted. Disc space height
loss, circumferential disc bulging, and endplate spurring result in
mild right greater than left neural foraminal narrowing, unchanged.
No spinal canal stenosis. Mild epidural fibrosis is again noted
about the left S1 nerve root.
IMPRESSION: 1. Stable postoperative and degenerative changes at L5-S1 with mild
right greater than left neural foraminal narrowing. No recurrent
disc herniation or spinal stenosis.
2. Mild facet arthrosis elsewhere without stenosis.

## 2016-07-27 ENCOUNTER — Telehealth: Payer: Self-pay | Admitting: Family Medicine

## 2016-07-27 MED ORDER — COLESEVELAM HCL 625 MG PO TABS: ORAL_TABLET | ORAL | 2 refills | Status: DC

## 2016-07-27 NOTE — Telephone Encounter (Signed)
Have her try just a 1/2 packet qod if she is willing. There are also tabs and we could try a low dose of those if she is willing

## 2016-07-27 NOTE — Telephone Encounter (Signed)
Spoke to the patient and she would rather just go ahead and change to tablets to take at night with her other meds.

## 2016-07-27 NOTE — Telephone Encounter (Signed)
Patient called stating that she was given medication at her appointment on 07/20/16 to lower her triglycerides, cholestyramine packet. She states that she cannot take this medication as it is upsetting her stomach. Please advise  Phone: [phone]

## 2016-07-27 NOTE — Telephone Encounter (Signed)
D/c packets and start Welchol tabs, 2 tabs po qhs to start, disp #60 with 3 rf, can increase to 3 tabs po bid if she tolerates and as needed

## 2016-07-27 NOTE — Telephone Encounter (Signed)
Sent in welchol tablets.

## 2016-07-28 MED FILL — WELCHOL 625 MG TABLET: 625 | 30 days supply | Qty: 60 | Fill #0

## 2016-08-02 MED FILL — BACLOFEN 20 MG TABLET: 20 | 90 days supply | Qty: 90 | Fill #2

## 2016-08-02 MED FILL — OMEPRAZOLE DR 40 MG CAPSULE: 40 | 30 days supply | Qty: 30 | Fill #1

## 2016-08-12 MED FILL — ROSUVASTATIN CALCIUM 20 MG: 20 | 30 days supply | Qty: 30 | Fill #4

## 2016-08-12 MED FILL — HYDROXYZINE PAM 50 MG CAP: 50 | 24 days supply | Qty: 180 | Fill #3

## 2016-08-12 MED FILL — diazePAM 10 MG TABS: 10 | 40 days supply | Qty: 40 | Fill #0

## 2016-09-01 ENCOUNTER — Encounter: Payer: Self-pay | Admitting: Obstetrics & Gynecology

## 2016-09-01 ENCOUNTER — Ambulatory Visit (INDEPENDENT_AMBULATORY_CARE_PROVIDER_SITE_OTHER): Payer: Medicare Other | Admitting: Obstetrics & Gynecology

## 2016-09-01 ENCOUNTER — Other Ambulatory Visit (HOSPITAL_COMMUNITY)
Admission: RE | Admit: 2016-09-01 | Discharge: 2016-09-01 | Disposition: A | Discharge status: Home / Self Care | Payer: Medicare Other | Source: Ambulatory Visit | Attending: Obstetrics & Gynecology | Admitting: Obstetrics & Gynecology

## 2016-09-01 VITALS — BP 105/58 | HR 68 | Ht 65.0 in | Wt 208.0 lb

## 2016-09-01 DIAGNOSIS — Z1151 Encounter for screening for human papillomavirus (HPV): Secondary | ICD-10-CM | POA: Insufficient documentation

## 2016-09-01 DIAGNOSIS — Z0000 Encounter for general adult medical examination without abnormal findings: Secondary | ICD-10-CM | POA: Diagnosis not present

## 2016-09-01 DIAGNOSIS — Z1239 Encounter for other screening for malignant neoplasm of breast: Secondary | ICD-10-CM

## 2016-09-01 DIAGNOSIS — Z01419 Encounter for gynecological examination (general) (routine) without abnormal findings: Secondary | ICD-10-CM

## 2016-09-01 DIAGNOSIS — N952 Postmenopausal atrophic vaginitis: Secondary | ICD-10-CM

## 2016-09-01 NOTE — Addendum Note (Signed)
Addended by: Phill Myron on: 09/01/2016 03:30 PM   Modules accepted: Orders

## 2016-09-01 NOTE — Progress Notes (Signed)
Subjective:     [name] is a 61 y.o. female here for a routine exam. G21011 s/p c/s x1.   LMP in her late 57's.  Not sure that exact age. Current complaints: pt takes Hoy Register was prev on Premarin cream. Ran out 3 years prev. Not sexually active for 15 years.  Pt has not had a PA in 5 years because of the painfulness of the exam.     Pt is s/p a urethral sling with no leakage of urine  Gynecologic History Patient's last menstrual period was 10/03/2012. Contraception: post menopausal status Last Pap: 5years Results were: normal Last mammogram: 08/28/2015. Results were: normal  Obstetric History OB History  No data available   The following portions of the patient's history were reviewed and updated as appropriate: allergies, current medications, past family history, past medical history, past social history, past surgical history and problem list.  Review of Systems Pertinent items are noted in HPI.    Objective:  BP (!) 105/58 (BP Location: Left Arm, Patient Position: Sitting)   Pulse 68   Ht 5\' 5"  (1.651 m)   Wt 208 lb (94.3 kg)   LMP 10/03/2012   BMI 34.61 kg/m  General Appearance:    Alert, cooperative, no distress, appears stated age  Head:    Normocephalic, without obvious abnormality, atraumatic  Eyes:    conjunctiva/corneas clear, EOM's intact, both eyes  Ears:    Normal external ear canals, both ears  Nose:   Nares normal, septum midline, mucosa normal, no drainage    or sinus tenderness  Throat:   Lips, mucosa, and tongue normal; teeth and gums normal  Neck:   Supple, symmetrical, trachea midline, no adenopathy;    thyroid:  no enlargement/tenderness/nodules  Back:     Symmetric, no curvature, ROM normal, no CVA tenderness  Lungs:     Clear to auscultation bilaterally, respirations unlabored  Chest Wall:    No tenderness or deformity   Heart:    Regular rate and rhythm, S1 and S2 normal, no murmur, rub   or gallop  Breast Exam:    No tenderness, masses, or  nipple abnormality  Abdomen:     Soft, non-tender, bowel sounds active all four quadrants,    no masses, no organomegaly; obese; infraumbilical incision- well healed.  Genitalia:    Normal female without lesion, discharge or tenderness; some discomfort with speculum but pt tol the exam without difficulty.       Extremities:   Extremities normal, atraumatic, no cyanosis or edema  Pulses:   2+ and symmetric all extremities  Skin:   Skin color, texture, turgor normal, no rashes or lesions     Assessment:    Healthy female exam.   Breast cancer screen Vaginal atrophy   Plan:    Mammogram ordered. Follow up in: 1 year.    Vaginal moisturizer vs coconut oil to introitus prn  Tinnie Kunin L. Harraway-Smith, M.D., Cherlynn June

## 2016-09-01 NOTE — Patient Instructions (Addendum)
Name of Product Description Perfume- Free Paraben-Free Glycerin-Free PH-balanced Cost per month  Hyalo-GYN Gel in Tampon applicator containing Hydeal-D, a natural source of moisture. Http://www.hyalogyn.com Yes No Yes No $25  Replens Gel in tampon applicator, Clings to vaginal lining to keep it moist Yes Yes No Yes $15-32  K-Y Liquibeads Suppository that melts in the vagina Yes Yes No No $18-36  Neogyn Cream Cream to soothe vulvar dryness and pain, contains cutaneous lysate, a healing ingredient; not internal moisturizer but may help with irritation on the vulvar  LinkMoves.fr Yes Yes No No $39   Vaginal Moisturizers Atrophic Vaginitis Introduction Atrophic vaginitis is a condition in which the tissues that line the vagina become dry and thin. This condition is most common in women who have stopped having regular menstrual periods (menopause). This usually starts when a woman is 60-79 years old. Estrogen helps to keep the vagina moist. It stimulates the vagina to produce a clear fluid that lubricates the vagina for sexual intercourse. This fluid also protects the vagina from infection. Lack of estrogen can cause the lining of the vagina to get thinner and dryer. The vagina may also shrink in size. It may become less elastic. Atrophic vaginitis tends to get worse over time as a woman's estrogen level drops. What are the causes? This condition is caused by the normal drop in estrogen that happens around the time of menopause. What increases the risk? Certain conditions or situations may lower a woman's estrogen level, which increases her risk of atrophic vaginitis. These include:  Taking medicine that blocks estrogen.  Having ovaries removed surgically.  Being treated for cancer with X-ray treatment (radiation) or medicines (chemotherapy).  Exercising very hard and often.  Having an eating disorder (anorexia).  Giving birth or breastfeeding.  Being over the age of  25.  Smoking. What are the signs or symptoms? Symptoms of this condition include:  Pain, soreness, or bleeding during sexual intercourse (dyspareunia).  Vaginal burning, irritation, or itching.  Pain or bleeding during a vaginal examination using a speculum (pelvic exam).  Loss of interest in sexual activity.  Having burning pain when passing urine.  Vaginal discharge that is brown or yellow. In some cases, there are no symptoms. How is this diagnosed? This condition is diagnosed with a medical history and physical exam. This will include a pelvic exam that checks whether the inside of your vagina appears pale, thin, or dry. Rarely, you may also have other tests, including:  A urine test.  A test that checks the acid balance in your vaginal fluid (acid balance test). How is this treated? Treatment for this condition may depend on the severity of your symptoms. Treatment may include:  Using an over-the-counter vaginal lubricant before you have sexual intercourse.  Using a long-acting vaginal moisturizer.  Using low-dose vaginal estrogen for moderate to severe symptoms that do not respond to other treatments. Options include creams, tablets, and inserts (vaginal rings). Before using vaginal estrogen, tell your health care provider if you have a history of:  Breast cancer.  Endometrial cancer.  Blood clots.  Taking medicines. You may be able to take a daily pill for dyspareunia. Discuss all of the risks of this medicine with your health care provider. It is usually not recommended for women who have a family history or personal history of breast cancer. If your symptoms are very mild and you are not sexually active, you may not need treatment. Follow these instructions at home:  Take medicines only as directed  by your health care provider. Do not use herbal or alternative medicines unless your health care provider says that you can.  Use over-the-counter creams, lubricants,  or moisturizers for dryness only as directed by your health care provider.  If your atrophic vaginitis is caused by menopause, discuss all of your menopausal symptoms and treatment options with your health care provider.  Do not douche.  Do not use products that can make your vagina dry. These include:  Scented feminine sprays.  Scented tampons.  Scented soaps.  If it hurts to have sex, talk with your sexual partner. Contact a health care provider if:  Your discharge looks different than normal.  Your vagina has an unusual smell.  You have new symptoms.  Your symptoms do not improve with treatment.  Your symptoms get worse. This information is not intended to replace advice given to you by your health care provider. Make sure you discuss any questions you have with your health care provider. Document Released: 11/12/2014 Document Revised: 12/04/2015 Document Reviewed: 06/19/2014  2017 Elsevier

## 2016-09-06 LAB — CYTOLOGY - PAP
Diagnosis: NEGATIVE
HPV: NOT DETECTED

## 2016-09-08 MED FILL — POTASSIUM CL ER 20 MEQ TABL: 20 | 90 days supply | Qty: 90 | Fill #0

## 2016-09-08 MED FILL — OMEPRAZOLE DR 40 MG CAPSULE: 40 | 30 days supply | Qty: 30 | Fill #2

## 2016-09-08 MED FILL — ROSUVASTATIN CALCIUM 20 MG: 20 | 30 days supply | Qty: 30 | Fill #5

## 2016-09-14 ENCOUNTER — Ambulatory Visit (HOSPITAL_BASED_OUTPATIENT_CLINIC_OR_DEPARTMENT_OTHER)
Admission: RE | Admit: 2016-09-14 | Discharge: 2016-09-14 | Disposition: A | Discharge status: Home / Self Care | Payer: Medicare Other | Source: Ambulatory Visit | Attending: Obstetrics & Gynecology | Admitting: Obstetrics & Gynecology

## 2016-09-14 ENCOUNTER — Other Ambulatory Visit: Payer: Self-pay | Admitting: Obstetrics & Gynecology

## 2016-09-14 ENCOUNTER — Encounter (HOSPITAL_BASED_OUTPATIENT_CLINIC_OR_DEPARTMENT_OTHER): Payer: Self-pay

## 2016-09-14 DIAGNOSIS — Z1239 Encounter for other screening for malignant neoplasm of breast: Secondary | ICD-10-CM

## 2016-09-14 DIAGNOSIS — Z1231 Encounter for screening mammogram for malignant neoplasm of breast: Secondary | ICD-10-CM | POA: Insufficient documentation

## 2016-09-14 DIAGNOSIS — Z01419 Encounter for gynecological examination (general) (routine) without abnormal findings: Secondary | ICD-10-CM

## 2016-09-28 ENCOUNTER — Other Ambulatory Visit: Payer: Self-pay | Admitting: Family Medicine

## 2016-10-04 ENCOUNTER — Other Ambulatory Visit: Payer: Self-pay | Admitting: Family Medicine

## 2016-10-04 DIAGNOSIS — E782 Mixed hyperlipidemia: Secondary | ICD-10-CM

## 2016-10-04 MED FILL — OMEPRAZOLE DR 40 MG CAPSULE: 40 | 30 days supply | Qty: 30 | Fill #3

## 2016-10-04 MED FILL — ROSUVASTATIN CALCIUM 20 MG: 20 | 30 days supply | Qty: 30 | Fill #0

## 2016-10-04 MED FILL — diazePAM 10 MG TABS: 10 | 40 days supply | Qty: 40 | Fill #1

## 2016-10-11 MED FILL — BACLOFEN 20 MG TABLET: 20 | 90 days supply | Qty: 90 | Fill #0

## 2016-10-15 MED FILL — FLUTICASONE PROP 50 MCG SPR: 50 | 30 days supply | Qty: 16 | Fill #1

## 2016-10-22 ENCOUNTER — Other Ambulatory Visit: Payer: Medicare Other

## 2016-11-10 ENCOUNTER — Other Ambulatory Visit: Payer: Self-pay | Admitting: Family Medicine

## 2016-11-10 MED ORDER — HYDROCODONE-ACETAMINOPHEN 7.5-325 MG PO TABS: 1.0000 | ORAL_TABLET | Freq: Four times a day (QID) | ORAL | 0 refills | Status: DC | PRN

## 2016-11-10 MED FILL — OMEPRAZOLE DR 40 MG CAPSULE: 40 | 30 days supply | Qty: 30 | Fill #0

## 2016-11-10 MED FILL — ROSUVASTATIN CALCIUM 20 MG: 20 | 30 days supply | Qty: 30 | Fill #1

## 2016-11-10 NOTE — Telephone Encounter (Signed)
Last RX: 07/20/16, #30 Last OV:  10/04/16 Next OV:  04/26/17 UDS:  04/20/16. No CSC on file. Rx printed and forwarded to PCP for signature. CSC placed with Rx. Pt will need to complete when she picks up Rx and give UDS at that time.

## 2016-11-10 NOTE — Telephone Encounter (Signed)
°  Relation to GY:KZLD Call back number: please call [phone]   Reason for call:  Patient requesting a refill HYDROcodone-acetaminophen (NORCO) 7.5-325 MG tablet

## 2016-11-11 ENCOUNTER — Encounter: Payer: Self-pay | Admitting: Family Medicine

## 2016-11-11 MED ORDER — HYDROCODONE-ACETAMINOPHEN 7.5-325 MG PO TABS: 1.0000 | ORAL_TABLET | Freq: Four times a day (QID) | ORAL | 0 refills | Status: DC | PRN

## 2016-11-11 NOTE — Telephone Encounter (Signed)
Patient notified to pickup hardcopy

## 2016-11-11 NOTE — Telephone Encounter (Signed)
Ok to refill with contract

## 2016-11-11 NOTE — Telephone Encounter (Signed)
Printed hardcopy/contract/PCP signed and put at the front desk for pickup Called the patient to notify ready (she did not answer left a detailed message)

## 2016-11-11 NOTE — Telephone Encounter (Signed)
Please advise 

## 2016-11-12 MED FILL — HYDROCODON-APAP 7.5-325: 7.5-325 | 7 days supply | Qty: 30 | Fill #0

## 2016-11-16 ENCOUNTER — Encounter: Payer: Self-pay | Admitting: Family Medicine

## 2016-11-16 DIAGNOSIS — Z79891 Long term (current) use of opiate analgesic: Secondary | ICD-10-CM | POA: Diagnosis not present

## 2016-11-16 DIAGNOSIS — Z79899 Other long term (current) drug therapy: Secondary | ICD-10-CM | POA: Diagnosis not present

## 2016-11-23 ENCOUNTER — Telehealth: Payer: Self-pay | Admitting: Family Medicine

## 2016-11-23 DIAGNOSIS — G4700 Insomnia, unspecified: Secondary | ICD-10-CM

## 2016-11-23 MED ORDER — DIAZEPAM 10 MG PO TABS: 10.0000 mg | ORAL_TABLET | Freq: Every day | ORAL | 1 refills | Status: DC | PRN

## 2016-11-23 MED FILL — PROMETHAZINE 25 MG TABLET: 25 | 13 days supply | Qty: 40 | Fill #1

## 2016-11-23 MED FILL — diazePAM 10 MG TABS: 10 | 30 days supply | Qty: 30 | Fill #0

## 2016-11-23 NOTE — Telephone Encounter (Signed)
Requesting:   Valium Contract     11/11/2016 UDS   High risk was due on 05/21/2016 Last OV    07/20/16--future appt is on 10/16./2018 Last Refill   #40 with 1 refill on 07/20/2016  Please Advise

## 2016-11-23 NOTE — Telephone Encounter (Signed)
Ok to refill after UDS complete, needs appt by July to keep refills active

## 2016-11-23 NOTE — Telephone Encounter (Signed)
Patient did have a UDS/contract on 11/11/2016 Scheduled her an appt for July 30 with PCP for medication followup

## 2016-11-23 NOTE — Telephone Encounter (Signed)
Faxed Valium to [place]

## 2016-11-23 NOTE — Telephone Encounter (Signed)
Caller name: Relationship to patient:Self Can be reached: [phone] Pharmacy:  Reason for call: Refill diazepam (VALIUM) 10 MG tablet [[id number]

## 2016-12-10 MED FILL — OMEPRAZOLE DR 40 MG CAPSULE: 40 | 30 days supply | Qty: 30 | Fill #1

## 2016-12-10 MED FILL — ROSUVASTATIN CALCIUM 20 MG: 20 | 30 days supply | Qty: 30 | Fill #2

## 2017-01-11 MED FILL — PROMETHAZINE 25 MG TABLET: 25 | 13 days supply | Qty: 40 | Fill #2

## 2017-01-11 MED FILL — OMEPRAZOLE DR 40 MG CAPSULE: 40 | 30 days supply | Qty: 30 | Fill #2

## 2017-01-11 MED FILL — BACLOFEN 20 MG TABLET: 20 | 90 days supply | Qty: 90 | Fill #1

## 2017-01-11 MED FILL — POTASSIUM CL ER 20 MEQ TABL: 20 | 90 days supply | Qty: 90 | Fill #1

## 2017-01-11 MED FILL — ROSUVASTATIN CALCIUM 20 MG: 20 | 30 days supply | Qty: 30 | Fill #3

## 2017-01-11 MED FILL — raNITIdine HCL 300 MG TABS: 300 | 30 days supply | Qty: 30 | Fill #3

## 2017-01-31 MED FILL — diazePAM 10 MG TABS: 10 | 30 days supply | Qty: 30 | Fill #1

## 2017-02-07 ENCOUNTER — Ambulatory Visit: Payer: Medicare Other | Admitting: Family Medicine

## 2017-02-15 ENCOUNTER — Other Ambulatory Visit: Payer: Self-pay | Admitting: Family Medicine

## 2017-02-15 MED FILL — ROSUVASTATIN CALCIUM 20 MG: 20 | 30 days supply | Qty: 30 | Fill #4

## 2017-02-15 MED FILL — OMEPRAZOLE DR 40 MG CAPSULE: 40 | 30 days supply | Qty: 30 | Fill #3

## 2017-02-15 NOTE — Telephone Encounter (Signed)
Relation to QF:DVOU Call back [phone]   Reason for call:  Patient requesting a refill HYDROcodone-acetaminophen (NORCO) 7.5-325 MG tablet

## 2017-02-25 NOTE — Telephone Encounter (Signed)
Requesting: Norco Contract: Yes UDS: moderate risk nxt scrn 02/11/17 Last OV: 07/20/16 Last Refill:11/11/16 Sig: 1 tab q6hr prn moderate pain  Please Advise

## 2017-02-26 NOTE — Telephone Encounter (Signed)
She can have a hydrocodone prescription. Same sig, same number but no more tabs til she is seen so either she needs to come in sooner than her appt or she has to mae them last.

## 2017-03-02 NOTE — Telephone Encounter (Signed)
Pt called in to follow up on refill request. Advised that provider is out of the office today.   Pt would like further assist

## 2017-03-03 MED ORDER — HYDROCODONE-ACETAMINOPHEN 7.5-325 MG PO TABS: 1.0000 | ORAL_TABLET | Freq: Four times a day (QID) | ORAL | 0 refills | Status: DC | PRN

## 2017-03-03 MED FILL — diazePAM 10 MG TABS: 10 | 20 days supply | Qty: 20 | Fill #2

## 2017-03-03 NOTE — Telephone Encounter (Signed)
Patient notified rx ready for pick up   pc

## 2017-03-04 MED FILL — HYDROCODON-APAP 7.5-325: 7.5-325 | 7 days supply | Qty: 30 | Fill #0

## 2017-03-17 ENCOUNTER — Ambulatory Visit (INDEPENDENT_AMBULATORY_CARE_PROVIDER_SITE_OTHER): Payer: Medicare Other | Admitting: Family Medicine

## 2017-03-17 ENCOUNTER — Encounter: Payer: Self-pay | Admitting: Family Medicine

## 2017-03-17 VITALS — BP 104/60 | HR 72 | Temp 98.0°F | Ht 65.0 in | Wt 215.0 lb

## 2017-03-17 DIAGNOSIS — R739 Hyperglycemia, unspecified: Secondary | ICD-10-CM | POA: Diagnosis not present

## 2017-03-17 DIAGNOSIS — R600 Localized edema: Secondary | ICD-10-CM

## 2017-03-17 DIAGNOSIS — R252 Cramp and spasm: Secondary | ICD-10-CM

## 2017-03-17 DIAGNOSIS — E782 Mixed hyperlipidemia: Secondary | ICD-10-CM | POA: Diagnosis not present

## 2017-03-17 MED ORDER — FUROSEMIDE 40 MG PO TABS: ORAL_TABLET | ORAL | 1 refills | Status: DC

## 2017-03-17 MED ORDER — OMEPRAZOLE 40 MG PO CPDR: 40.0000 mg | DELAYED_RELEASE_CAPSULE | Freq: Every day | ORAL | 5 refills | Status: DC

## 2017-03-17 MED ORDER — POTASSIUM CHLORIDE CRYS ER 20 MEQ PO TBCR: 20.0000 meq | EXTENDED_RELEASE_TABLET | Freq: Every day | ORAL | 1 refills | Status: DC

## 2017-03-17 MED ORDER — ROSUVASTATIN CALCIUM 20 MG PO TABS: 20.0000 mg | ORAL_TABLET | Freq: Every day | ORAL | 5 refills | Status: DC

## 2017-03-17 MED FILL — OMEPRAZOLE DR 40 MG CAPSULE: 40 | 30 days supply | Qty: 30 | Fill #4

## 2017-03-17 MED FILL — ROSUVASTATIN CALCIUM 20 MG: 20 | 30 days supply | Qty: 30 | Fill #5

## 2017-03-17 NOTE — Patient Instructions (Signed)
Edema Edema is when you have too much fluid in your body or under your skin. Edema may make your legs, feet, and ankles swell up. Swelling is also common in looser tissues, like around your eyes. This is a common condition. It gets more common as you get older. There are many possible causes of edema. Eating too much salt (sodium) and being on your feet or sitting for a long time can cause edema in your legs, feet, and ankles. Hot weather may make edema worse. Edema is usually painless. Your skin may look swollen or shiny. Follow these instructions at home:  Keep the swollen body part raised (elevated) above the level of your heart when you are sitting or lying down.  Do not sit still or stand for a long time.  Do not wear tight clothes. Do not wear garters on your upper legs.  Exercise your legs. This can help the swelling go down.  Wear elastic bandages or support stockings as told by your doctor.  Eat a low-salt (low-sodium) diet to reduce fluid as told by your doctor.  Depending on the cause of your swelling, you may need to limit how much fluid you drink (fluid restriction).  Take over-the-counter and prescription medicines only as told by your doctor. Contact a doctor if:  Treatment is not working.  You have heart, liver, or kidney disease and have symptoms of edema.  You have sudden and unexplained weight gain. Get help right away if:  You have shortness of breath or chest pain.  You cannot breathe when you lie down.  You have pain, redness, or warmth in the swollen areas.  You have heart, liver, or kidney disease and get edema all of a sudden.  You have a fever and your symptoms get worse all of a sudden. Summary  Edema is when you have too much fluid in your body or under your skin.  Edema may make your legs, feet, and ankles swell up. Swelling is also common in looser tissues, like around your eyes.  Raise (elevate) the swollen body part above the level of your  heart when you are sitting or lying down.  Follow your doctor's instructions about diet and how much fluid you can drink (fluid restriction). This information is not intended to replace advice given to you by your health care provider. Make sure you discuss any questions you have with your health care provider. Document Released: 12/15/2007 Document Revised: 07/16/2016 Document Reviewed: 07/16/2016 Elsevier Interactive Patient Education  2017 Elsevier Inc.  

## 2017-03-18 LAB — COMPREHENSIVE METABOLIC PANEL
ALT: 47 U/L — AB (ref 0–35)
AST: 32 U/L (ref 0–37)
Albumin: 4.5 g/dL (ref 3.5–5.2)
Alkaline Phosphatase: 67 U/L (ref 39–117)
BILIRUBIN TOTAL: 0.6 mg/dL (ref 0.2–1.2)
BUN: 10 mg/dL (ref 6–23)
CALCIUM: 9.6 mg/dL (ref 8.4–10.5)
CO2: 29 meq/L (ref 19–32)
CREATININE: 0.85 mg/dL (ref 0.40–1.20)
Chloride: 104 mEq/L (ref 96–112)
GFR: 72.71 mL/min (ref >60.00)
GLUCOSE: 97 mg/dL (ref 70–99)
Potassium: 4.1 mEq/L (ref 3.5–5.1)
Sodium: 140 mEq/L (ref 135–145)
TOTAL PROTEIN: 6.9 g/dL (ref 6.0–8.3)

## 2017-03-18 LAB — MAGNESIUM: MAGNESIUM: 2.1 mg/dL (ref 1.5–2.5)

## 2017-03-20 DIAGNOSIS — R252 Cramp and spasm: Secondary | ICD-10-CM | POA: Insufficient documentation

## 2017-03-20 NOTE — Assessment & Plan Note (Signed)
minimize simple carbs. Increase exercise as tolerated.  

## 2017-03-20 NOTE — Progress Notes (Signed)
Patient ID: [name], female   DOB: [birth year], 61 y.o.   MRN: [id number]   Subjective:    Patient ID: [name], female    DOB: [birth year], 61 y.o.   MRN: [id number]  Chief Complaint  Patient presents with  . Foot Swelling    Patient is here today C/O LLE Edema.  Usually wears compression stockings except when she wears shorts.  Has also been cramping in that foot. 2 weeks ago she ran a fever of 102.9.  She got the fever down and the following morning her foot was red and swollen.    HPI Patient is in today for follow up. She had a recent febrile illness at 102.5 max with chills malaise and a rash but that was 2 weeks ago and has resolved. She is frustrated with increased pedal edema in b/l legs. Denies CP/palp/SOB/HA/congestion/fevers/GI or GU c/o. Taking meds as prescribed  Past Medical History:  Diagnosis Date  . Acute sinusitis 03/27/2013  . Allergy    seasonal  . Anxiety   . Arthritis 06/09/2013  . Depression   . Dyspareunia   . Edema extremities   . Encounter for preventative adult health care exam with abnormal findings 04/21/2014  . GERD (gastroesophageal reflux disease)   . Hip pain, left 11/01/2011  . History of proteinuria syndrome   . Hyperlipidemia   . Insomnia 03/11/2013  . Low back pain 05/09/2011  . Lymphadenitis 06/16/2012  . Myalgia and myositis 02/15/2015  . Obese   . Onychomycosis 05/09/2011  . Oral lesion 08/26/2013  . Otitis externa 06/23/2012   left  . Perimenopause 03/11/2013  . RLS (restless legs syndrome)   . RUQ pain 09/20/2011  . Sleep apnea 04/21/2014  . Unspecified constipation 05/13/2013  . Urinary frequency 08/26/2013    Past Surgical History:  Procedure Laterality Date  . BACK SURGERY  2006   low, discectomy for herniated disc  . BLADDER SUSPENSION N/A 11/07/2012   Procedure: [facility];  Surgeon: Malka So, MD;  Location: [facility];  Service: Urology;  Laterality: N/A;  . CARPAL TUNNEL RELEASE Right 2006  .  CESAREAN SECTION  1993  . CYSTOSCOPY N/A 11/07/2012   Procedure: CYSTOSCOPY;  Surgeon: Malka So, MD;  Location: [facility];  Service: Urology;  Laterality: N/A;  . TUBAL LIGATION  1993    Family History  Problem Relation Age of Onset  . Cancer Mother 81       thyroid, malignant brain tumor  . Arthritis Mother        neck, back  . Hepatitis Sister        Hepatitis C  . Heart disease Sister        stent  . Diabetes Maternal Grandmother   . Arthritis Father        back pain  . Heart disease Father        stent    Social History   Social History  . Marital status: Married    Spouse name: N/A  . Number of children: N/A  . Years of education: N/A   Occupational History  . Not on file.   Social History Main Topics  . Smoking status: Former Smoker    Packs/day: 0.50    Years: 20.00    Types: Cigarettes    Quit date: 07/12/1992  . Smokeless tobacco: Never Used  . Alcohol use Yes     Comment: occasionaly  . Drug use: No  .  Sexual activity: Yes    Partners: Male   Other Topics Concern  . Not on file   Social History Narrative  . No narrative on file    Outpatient Medications Prior to Visit  Medication Sig Dispense Refill  . albuterol (PROVENTIL HFA;VENTOLIN HFA) 108 (90 BASE) MCG/ACT inhaler Inhale 2 puffs into the lungs every 6 (six) hours as needed for wheezing or shortness of breath. 1 Inhaler 2  . baclofen (LIORESAL) 20 MG tablet TAKE 1 TABLET (20 MG) BY MOUTH AT BEDTIME AS NEEDED FOR MUSCLE SPASMS. 90 tablet 5  . BIOTIN PO Take by mouth.    . Calcium Carbonate-Vitamin D (CALTRATE 600+D) 600-400 MG-UNIT per tablet Take 1 tablet by mouth daily.     . Cholecalciferol (VITAMIN D3) 1000 UNITS CAPS Take by mouth.      . colesevelam (WELCHOL) 625 MG tablet Take 2 tablets by mouth at bedtime 60 tablet 2  . conjugated estrogens (PREMARIN) vaginal cream Place vaginally as directed. 3 times a week at bedtime    . diazepam (VALIUM) 10 MG tablet Take 1  tablet (10 mg total) by mouth daily as needed for anxiety. 40 tablet 1  . FLAXSEED, LINSEED, PO Take by mouth every other day.     . fluticasone (FLONASE) 50 MCG/ACT nasal spray PLACE 2 SPRAYS INTO BOTH NOSTRILS DAILY. 16 g 6  . HYDROcodone-acetaminophen (NORCO) 7.5-325 MG tablet Take 1 tablet by mouth every 6 (six) hours as needed for moderate pain. 30 tablet 0  . hydrOXYzine (ATARAX/VISTARIL) 50 MG tablet Take 50 mg by mouth every 6 (six) hours as needed.    . Nutritional Supplements (ESTROVEN PO) Take by mouth.    . promethazine (PHENERGAN) 25 MG tablet Take 1 tablet (25 mg total) by mouth every 8 (eight) hours as needed for nausea. 40 tablet 1  . ranitidine (ZANTAC) 300 MG capsule Take 1 capsule (300 mg total) by mouth every evening. 30 capsule 5  . rOPINIRole (REQUIP) 1 MG tablet Take 1 mg by mouth daily.      . benzonatate (TESSALON) 100 MG capsule Take 1 capsule (100 mg total) by mouth 3 (three) times daily as needed. 21 capsule 0  . furosemide (LASIX) 40 MG tablet 1 tab po bid x 3 days then 1 tab po daily 120 tablet 1  . metolazone (ZAROXOLYN) 2.5 MG tablet Take 1 tablet (2.5 mg total) by mouth 2 (two) times daily as needed. 30 tablet 2  . omeprazole (PRILOSEC) 40 MG capsule TAKE ONE CAPSULE BY MOUTH DAILY 30 capsule 5  . potassium chloride SA (K-DUR,KLOR-CON) 20 MEQ tablet Take 1 tablet (20 mEq total) by mouth daily. 90 tablet 1  . ranitidine (ZANTAC) 300 MG tablet TAKE 1 TABLET BY MOUTH AT BEDTIME 30 tablet 3  . rosuvastatin (CRESTOR) 20 MG tablet Take 1 tablet (20 mg total) by mouth daily. Failed Atorvastatin and Simvastatin 30 tablet 5  . rosuvastatin (CRESTOR) 20 MG tablet TAKE 1 TABLET (20 MG TOTAL) BY MOUTH DAILY. 30 tablet 5  . amoxicillin-clavulanate (AUGMENTIN) 875-125 MG tablet Take 1 tablet by mouth 2 (two) times daily. 20 tablet 0  . cholestyramine (QUESTRAN) 4 g packet Take 1 packet (4 g total) by mouth 2 (two) times daily with a meal. 60 each 3  . gabapentin (NEURONTIN) 300  MG capsule Take 1 capsule (300 mg total) by mouth 2 (two) times daily. 60 capsule 3  . lamoTRIgine (LAMICTAL) 200 MG tablet Take 200 mg by mouth daily.  No facility-administered medications prior to visit.     Allergies  Allergen Reactions  . Carbamazepine Hives  . Ceclor [Cefaclor]     Tongue swell  . Morphine And Related Itching  . Atorvastatin Other (See Comments)    Leg pain    Review of Systems  Constitutional: Negative for fever and malaise/fatigue.  HENT: Negative for congestion.   Eyes: Negative for blurred vision.  Respiratory: Negative for shortness of breath.   Cardiovascular: Positive for leg swelling. Negative for chest pain and palpitations.  Gastrointestinal: Negative for abdominal pain, blood in stool and nausea.  Genitourinary: Negative for dysuria and frequency.  Musculoskeletal: Positive for myalgias. Negative for falls.  Skin: Negative for rash.  Neurological: Negative for dizziness, loss of consciousness and headaches.  Endo/Heme/Allergies: Negative for environmental allergies.  Psychiatric/Behavioral: Negative for depression. The patient is not nervous/anxious.        Objective:    Physical Exam  Constitutional: She is oriented to person, place, and time. She appears well-developed and well-nourished. No distress.  HENT:  Head: Normocephalic and atraumatic.  Nose: Nose normal.  Eyes: Right eye exhibits no discharge. Left eye exhibits no discharge.  Neck: Normal range of motion. Neck supple.  Cardiovascular: Normal rate and regular rhythm.   No murmur heard. Pulmonary/Chest: Effort normal and breath sounds normal.  Abdominal: Soft. Bowel sounds are normal. There is no tenderness.  Musculoskeletal: She exhibits no edema.  Neurological: She is alert and oriented to person, place, and time.  Skin: Skin is warm and dry.  Psychiatric: She has a normal mood and affect.  Nursing note and vitals reviewed.   BP 104/60 (BP Location: Right Arm,  Patient Position: Sitting, Cuff Size: Large)   Pulse 72   Temp 98 F (36.7 C) (Oral)   Ht 5\' 5"  (1.651 m)   Wt 215 lb (97.5 kg)   LMP 10/03/2012   SpO2 97%   BMI 35.78 kg/m  Wt Readings from Last 3 Encounters:  03/17/17 215 lb (97.5 kg)  09/01/16 208 lb (94.3 kg)  07/20/16 202 lb 9.6 oz (91.9 kg)     Lab Results  Component Value Date   WBC 6.5 07/16/2016   HGB 14.1 07/16/2016   HCT 41.7 07/16/2016   PLT 244.0 07/16/2016   GLUCOSE 97 03/17/2017   CHOL 169 07/16/2016   TRIG 268.0 (H) 07/16/2016   HDL 54.90 07/16/2016   LDLDIRECT 83.0 07/16/2016   LDLCALC 117 (H) 04/18/2014   ALT 47 (H) 03/17/2017   AST 32 03/17/2017   NA 140 03/17/2017   K 4.1 03/17/2017   CL 104 03/17/2017   CREATININE 0.85 03/17/2017   BUN 10 03/17/2017   CO2 29 03/17/2017   TSH 1.22 07/16/2016   HGBA1C 5.3 07/16/2016    Lab Results  Component Value Date   TSH 1.22 07/16/2016   Lab Results  Component Value Date   WBC 6.5 07/16/2016   HGB 14.1 07/16/2016   HCT 41.7 07/16/2016   MCV 84.6 07/16/2016   PLT 244.0 07/16/2016   Lab Results  Component Value Date   NA 140 03/17/2017   K 4.1 03/17/2017   CO2 29 03/17/2017   GLUCOSE 97 03/17/2017   BUN 10 03/17/2017   CREATININE 0.85 03/17/2017   BILITOT 0.6 03/17/2017   ALKPHOS 67 03/17/2017   AST 32 03/17/2017   ALT 47 (H) 03/17/2017   PROT 6.9 03/17/2017   ALBUMIN 4.5 03/17/2017   CALCIUM 9.6 03/17/2017   ANIONGAP 13 01/22/2015  GFR 72.71 03/17/2017   Lab Results  Component Value Date   CHOL 169 07/16/2016   Lab Results  Component Value Date   HDL 54.90 07/16/2016   Lab Results  Component Value Date   LDLCALC 117 (H) 04/18/2014   Lab Results  Component Value Date   TRIG 268.0 (H) 07/16/2016   Lab Results  Component Value Date   CHOLHDL 3 07/16/2016   Lab Results  Component Value Date   HGBA1C 5.3 07/16/2016       Assessment & Plan:   Problem List Items Addressed This Visit    Hyperlipidemia    Encouraged  heart healthy diet, increase exercise, avoid trans fats, consider a fish oil cap daily      Relevant Medications   furosemide (LASIX) 40 MG tablet   rosuvastatin (CRESTOR) 20 MG tablet   Edema extremities    Encouraged to minimize sodium, elevate feet above heart, compression hose, diuretics report worsening symptoms      Hyperglycemia    minimize simple carbs. Increase exercise as tolerated.       Muscle cramp    Increase hydration. Check labs report worsening      Relevant Orders   Magnesium (Completed)   Comprehensive metabolic panel (Completed)    Other Visit Diagnoses    Pedal edema    -  Primary   Relevant Orders   US Venous Img Lower Unilateral Left   Hyperlipidemia, mixed       Relevant Medications   furosemide (LASIX) 40 MG tablet   rosuvastatin (CRESTOR) 20 MG tablet      I have discontinued Ms. Caamano's benzonatate, metolazone, gabapentin, amoxicillin-clavulanate, and cholestyramine. I have also changed her potassium chloride SA and furosemide. Additionally, I am having her maintain her rOPINIRole, Calcium Carbonate-Vitamin D, (FLAXSEED, LINSEED, PO), Vitamin D3, BIOTIN PO, Nutritional Supplements (ESTROVEN PO), conjugated estrogens, hydrOXYzine, albuterol, ranitidine, fluticasone, promethazine, colesevelam, baclofen, diazepam, HYDROcodone-acetaminophen, lamoTRIgine, rosuvastatin, and omeprazole.  Meds ordered this encounter  Medications  . lamoTRIgine (LAMICTAL) 150 MG tablet    Sig: Take 150 mg by mouth daily.  Marland Kitchen DISCONTD: rosuvastatin (CRESTOR) 20 MG tablet    Sig: Take 1 tablet (20 mg total) by mouth daily. Failed Atorvastatin and Simvastatin    Dispense:  30 tablet    Refill:  5  . DISCONTD: omeprazole (PRILOSEC) 40 MG capsule    Sig: Take 1 capsule (40 mg total) by mouth daily.    Dispense:  30 capsule    Refill:  5  . potassium chloride SA (K-DUR,KLOR-CON) 20 MEQ tablet    Sig: Take 1 tablet (20 mEq total) by mouth daily. And a 2nd tab dialy prn 2  tabs of Lasix in a day    Dispense:  100 tablet    Refill:  1  . furosemide (LASIX) 40 MG tablet    Sig: 1 tab po daily and a second tab daily prn increased edema or weight gain > 3#/24 hours.    Dispense:  120 tablet    Refill:  1  . rosuvastatin (CRESTOR) 20 MG tablet    Sig: Take 1 tablet (20 mg total) by mouth daily. Failed Atorvastatin and Simvastatin    Dispense:  30 tablet    Refill:  5  . omeprazole (PRILOSEC) 40 MG capsule    Sig: Take 1 capsule (40 mg total) by mouth daily.    Dispense:  30 capsule    Refill:  5     Penni Homans, MD

## 2017-03-20 NOTE — Assessment & Plan Note (Signed)
Increase hydration. Check labs report worsening

## 2017-03-20 NOTE — Assessment & Plan Note (Signed)
Encouraged heart healthy diet, increase exercise, avoid trans fats, consider a fish oil cap daily 

## 2017-03-20 NOTE — Assessment & Plan Note (Signed)
Encouraged to minimize sodium, elevate feet above heart, compression hose, diuretics report worsening symptoms

## 2017-03-22 ENCOUNTER — Ambulatory Visit (HOSPITAL_BASED_OUTPATIENT_CLINIC_OR_DEPARTMENT_OTHER)
Admission: RE | Admit: 2017-03-22 | Discharge: 2017-03-22 | Disposition: A | Discharge status: Home / Self Care | Payer: Medicare Other | Source: Ambulatory Visit | Attending: Family Medicine | Admitting: Family Medicine

## 2017-03-22 DIAGNOSIS — R600 Localized edema: Secondary | ICD-10-CM | POA: Diagnosis not present

## 2017-04-18 MED FILL — POTASSIUM CL ER 20 MEQ TABL: 20 | 50 days supply | Qty: 100 | Fill #0

## 2017-04-18 MED FILL — OMEPRAZOLE DR 40 MG CAPSULE: 40 | 30 days supply | Qty: 30 | Fill #5

## 2017-04-18 MED FILL — ROSUVASTATIN CALCIUM 20 MG: 20 | 30 days supply | Qty: 30 | Fill #0

## 2017-04-18 MED FILL — FUROSEMIDE 40 MG TAB: 40 | 60 days supply | Qty: 120 | Fill #0

## 2017-04-18 MED FILL — BACLOFEN 20 MG TABLET: 20 | 90 days supply | Qty: 90 | Fill #2

## 2017-04-26 ENCOUNTER — Encounter: Payer: Self-pay | Admitting: Family Medicine

## 2017-04-26 ENCOUNTER — Telehealth: Payer: Self-pay

## 2017-04-26 ENCOUNTER — Ambulatory Visit (INDEPENDENT_AMBULATORY_CARE_PROVIDER_SITE_OTHER): Payer: Medicare Other | Admitting: Family Medicine

## 2017-04-26 VITALS — BP 126/66 | HR 68 | Temp 97.7°F | Resp 18 | Ht 65.0 in | Wt 216.6 lb

## 2017-04-26 DIAGNOSIS — M25552 Pain in left hip: Secondary | ICD-10-CM | POA: Diagnosis not present

## 2017-04-26 DIAGNOSIS — R739 Hyperglycemia, unspecified: Secondary | ICD-10-CM

## 2017-04-26 DIAGNOSIS — F418 Other specified anxiety disorders: Secondary | ICD-10-CM

## 2017-04-26 DIAGNOSIS — E782 Mixed hyperlipidemia: Secondary | ICD-10-CM

## 2017-04-26 DIAGNOSIS — R110 Nausea: Secondary | ICD-10-CM | POA: Diagnosis not present

## 2017-04-26 DIAGNOSIS — Z79899 Other long term (current) drug therapy: Secondary | ICD-10-CM | POA: Diagnosis not present

## 2017-04-26 DIAGNOSIS — B354 Tinea corporis: Secondary | ICD-10-CM

## 2017-04-26 DIAGNOSIS — Z0000 Encounter for general adult medical examination without abnormal findings: Secondary | ICD-10-CM

## 2017-04-26 DIAGNOSIS — Z23 Encounter for immunization: Secondary | ICD-10-CM | POA: Diagnosis not present

## 2017-04-26 DIAGNOSIS — G4730 Sleep apnea, unspecified: Secondary | ICD-10-CM

## 2017-04-26 DIAGNOSIS — G4700 Insomnia, unspecified: Secondary | ICD-10-CM

## 2017-04-26 DIAGNOSIS — J209 Acute bronchitis, unspecified: Secondary | ICD-10-CM

## 2017-04-26 DIAGNOSIS — G2581 Restless legs syndrome: Secondary | ICD-10-CM

## 2017-04-26 DIAGNOSIS — E669 Obesity, unspecified: Secondary | ICD-10-CM

## 2017-04-26 HISTORY — DX: Tinea corporis: B35.4

## 2017-04-26 MED ORDER — PROMETHAZINE HCL 25 MG PO TABS: 25.0000 mg | ORAL_TABLET | Freq: Three times a day (TID) | ORAL | 3 refills | Status: DC | PRN

## 2017-04-26 MED ORDER — HYDROCODONE-ACETAMINOPHEN 7.5-325 MG PO TABS: 1.0000 | ORAL_TABLET | Freq: Four times a day (QID) | ORAL | 0 refills | Status: DC | PRN

## 2017-04-26 MED ORDER — BACLOFEN 20 MG PO TABS: ORAL_TABLET | ORAL | 3 refills | Status: DC

## 2017-04-26 MED ORDER — CLOTRIMAZOLE-BETAMETHASONE 1-0.05 % EX CREA: 1.0000 "application " | TOPICAL_CREAM | Freq: Two times a day (BID) | CUTANEOUS | Status: DC

## 2017-04-26 MED ORDER — DIAZEPAM 10 MG PO TABS: 10.0000 mg | ORAL_TABLET | Freq: Every day | ORAL | 1 refills | Status: DC | PRN

## 2017-04-26 MED ORDER — RANITIDINE HCL 300 MG PO CAPS: 300.0000 mg | ORAL_CAPSULE | Freq: Every evening | ORAL | 5 refills | Status: DC

## 2017-04-26 MED FILL — HYDROCODON-APAP 7.5-325: 7.5-325 | 7 days supply | Qty: 30 | Fill #0

## 2017-04-26 MED FILL — raNITIdine HCL 300 MG TABS: 300 | 30 days supply | Qty: 30 | Fill #0

## 2017-04-26 MED FILL — PROMETHAZINE 25 MG TABLET: 25 | 14 days supply | Qty: 40 | Fill #0

## 2017-04-26 MED FILL — CLOTRIMAZOLE-BETAMETHASONE: 1-0.05 | 30 days supply | Qty: 45 | Fill #0

## 2017-04-26 NOTE — Assessment & Plan Note (Signed)
Last testing about 4 years. Snoring is present but no witnessed apnea.

## 2017-04-26 NOTE — Assessment & Plan Note (Addendum)
hgba1c acceptable, minimize simple carbs. Increase exercise as tolerated. Pneumovax given today

## 2017-04-26 NOTE — Assessment & Plan Note (Signed)
On left hand Lotrisone cream given

## 2017-04-26 NOTE — Assessment & Plan Note (Signed)
Patient encouraged to maintain heart healthy diet, regular exercise, adequate sleep. Consider daily probiotics. Take medications as prescribed. Labs ordered. Given and reviewed copy of ACP documents from [employer] and encouraged to complete and return. Dexa was normal in 2016 repeat in 2021, colonoscopy unremarkable 2010 repat 2020, MGM 3/18 repeat in 1 year. Pap with GYN 2/18 normal repeat in 2021.

## 2017-04-26 NOTE — Assessment & Plan Note (Signed)
Was following with Dr Caprice Beaver but is now with Pauline Good, MSN, FNP-C at [facility]. Overall feeling stable and doing well

## 2017-04-26 NOTE — Patient Instructions (Addendum)
Tylenol/Acetaminophen ES 500 mg tabs, 1-2 tabs twice daily every day =Tylenol/Acetaminophen/APAP take up to 3000 mg in 24 hours Preventive Care 40-64 Years, Female Preventive care refers to lifestyle choices and visits with your health care provider that can promote health and wellness. What does preventive care include?  A yearly physical exam. This is also called an annual well check.  Dental exams once or twice a year.  Routine eye exams. Ask your health care provider how often you should have your eyes checked.  Personal lifestyle choices, including: ? Daily care of your teeth and gums. ? Regular physical activity. ? Eating a healthy diet. ? Avoiding tobacco and drug use. ? Limiting alcohol use. ? Practicing safe sex. ? Taking low-dose aspirin daily starting at age 5. ? Taking vitamin and mineral supplements as recommended by your health care provider. What happens during an annual well check? The services and screenings done by your health care provider during your annual well check will depend on your age, overall health, lifestyle risk factors, and family history of disease. Counseling Your health care provider may ask you questions about your:  Alcohol use.  Tobacco use.  Drug use.  Emotional well-being.  Home and relationship well-being.  Sexual activity.  Eating habits.  Work and work Statistician.  Method of birth control.  Menstrual cycle.  Pregnancy history.  Screening You may have the following tests or measurements:  Height, weight, and BMI.  Blood pressure.  Lipid and cholesterol levels. These may be checked every 5 years, or more frequently if you are over 43 years old.  Skin check.  Lung cancer screening. You may have this screening every year starting at age 34 if you have a 30-pack-year history of smoking and currently smoke or have quit within the past 15 years.  Fecal occult blood test (FOBT) of the stool. You may have this test every  year starting at age 30.  Flexible sigmoidoscopy or colonoscopy. You may have a sigmoidoscopy every 5 years or a colonoscopy every 10 years starting at age 44.  Hepatitis C blood test.  Hepatitis B blood test.  Sexually transmitted disease (STD) testing.  Diabetes screening. This is done by checking your blood sugar (glucose) after you have not eaten for a while (fasting). You may have this done every 1-3 years.  Mammogram. This may be done every 1-2 years. Talk to your health care provider about when you should start having regular mammograms. This may depend on whether you have a family history of breast cancer.  BRCA-related cancer screening. This may be done if you have a family history of breast, ovarian, tubal, or peritoneal cancers.  Pelvic exam and Pap test. This may be done every 3 years starting at age 39. Starting at age 54, this may be done every 5 years if you have a Pap test in combination with an HPV test.  Bone density scan. This is done to screen for osteoporosis. You may have this scan if you are at high risk for osteoporosis.  Discuss your test results, treatment options, and if necessary, the need for more tests with your health care provider. Vaccines Your health care provider may recommend certain vaccines, such as:  Influenza vaccine. This is recommended every year.  Tetanus, diphtheria, and acellular pertussis (Tdap, Td) vaccine. You may need a Td booster every 10 years.  Varicella vaccine. You may need this if you have not been vaccinated.  Zoster vaccine. You may need this after age 86.  Measles, mumps, and rubella (MMR) vaccine. You may need at least one dose of MMR if you were born in 1957 or later. You may also need a second dose.  Pneumococcal 13-valent conjugate (PCV13) vaccine. You may need this if you have certain conditions and were not previously vaccinated.  Pneumococcal polysaccharide (PPSV23) vaccine. You may need one or two doses if you smoke  cigarettes or if you have certain conditions.  Meningococcal vaccine. You may need this if you have certain conditions.  Hepatitis A vaccine. You may need this if you have certain conditions or if you travel or work in places where you may be exposed to hepatitis A.  Hepatitis B vaccine. You may need this if you have certain conditions or if you travel or work in places where you may be exposed to hepatitis B.  Haemophilus influenzae type b (Hib) vaccine. You may need this if you have certain conditions.  Talk to your health care provider about which screenings and vaccines you need and how often you need them. This information is not intended to replace advice given to you by your health care provider. Make sure you discuss any questions you have with your health care provider. Document Released: 07/25/2015 Document Revised: 03/17/2016 Document Reviewed: 04/29/2015 Elsevier Interactive Patient Education  2017 [employer].

## 2017-04-26 NOTE — Assessment & Plan Note (Signed)
On requip prescribed by psychiatry

## 2017-04-26 NOTE — Assessment & Plan Note (Signed)
Encouraged DASH diet, decrease po intake and increase exercise as tolerated. Needs 7-8 hours of sleep nightly. Avoid trans fats, eat small, frequent meals every 4-5 hours with lean proteins, complex carbs and healthy fats. Minimize simple carbs, bariatric referral 

## 2017-04-26 NOTE — Assessment & Plan Note (Signed)
Encouraged heart healthy diet, increase exercise, avoid trans fats, consider a krill oil cap daily 

## 2017-04-26 NOTE — Assessment & Plan Note (Signed)
Offered referral to sports med she will call if worsens. Given refill on the Hydrocodone to use sparingly, UDS obtained. Contract in place

## 2017-04-26 NOTE — Progress Notes (Signed)
Subjective:  I acted as a Education administrator for Dr. Charlett Blake. [name], [place]  Patient ID: [name], female    DOB: April 19, 1956, 61 y.o.   MRN: [id number]  No chief complaint on file.   HPI  Patient is in today for for annual Exam and follow-up on chronic medical concerns including hyperlipidemia obesity, hyperglycemia, insomnia, anxiety. She is tearful about her long distance relationship with her aging father. She reports he is in his 57s and she has not Dr. for 2 years. She has a difficult relationship with his family. She denies any recent febrile illness or acute hospitalizations. No polyuria or polydipsia. She does note she has an itchy rash on her left hand as well as a violaceous discoloration on her right arm which is been present for years although she does question if it is worsened. She [facility] and agrees to call them for further follow-up. She is doing well with activities of daily living and tries to maintain a heart healthy diet however she is frustrated with her persistent weight troubles. Denies CP/palp/SOB/HA/congestion/fevers/GI or GU c/o. Taking meds as prescribed   Patient Care Team: Mosie Lukes, MD as PCP - General (Family Medicine) Inda Castle, MD as Consulting Physician (Gastroenterology) Ralene Bathe, MD as Consulting Physician (Ophthalmology)   Past Medical History:  Diagnosis Date  . Acute sinusitis 03/27/2013  . Allergy    seasonal  . Anxiety   . Arthritis 06/09/2013  . Depression   . Dyspareunia   . Edema extremities   . Encounter for preventative adult health care exam with abnormal findings 04/21/2014  . GERD (gastroesophageal reflux disease)   . Hip pain, left 11/01/2011  . History of proteinuria syndrome   . Hyperlipidemia   . Insomnia 03/11/2013  . Low back pain 05/09/2011  . Lymphadenitis 06/16/2012  . Myalgia and myositis 02/15/2015  . Obese   . Onychomycosis 05/09/2011  . Oral lesion 08/26/2013  . Otitis externa 06/23/2012   left  . Perimenopause 03/11/2013  . RLS (restless legs syndrome)   . RUQ pain 09/20/2011  . Sleep apnea 04/21/2014  . Tinea corporis 04/26/2017  . Unspecified constipation 05/13/2013  . Urinary frequency 08/26/2013    Past Surgical History:  Procedure Laterality Date  . BACK SURGERY  2006   low, discectomy for herniated disc  . BLADDER SUSPENSION N/A 11/07/2012   Procedure: [facility] SLING;  Surgeon: Malka So, MD;  Location: [facility];  Service: Urology;  Laterality: N/A;  . CARPAL TUNNEL RELEASE Right 2006  . CESAREAN SECTION  1993  . CYSTOSCOPY N/A 11/07/2012   Procedure: CYSTOSCOPY;  Surgeon: Malka So, MD;  Location: [facility];  Service: Urology;  Laterality: N/A;  . TUBAL LIGATION  1993    Family History  Problem Relation Age of Onset  . Cancer Mother 66       thyroid, malignant brain tumor  . Arthritis Mother        neck, back  . Hepatitis Sister        Hepatitis C  . Heart disease Sister        stent  . Diabetes Maternal Grandmother   . Arthritis Father        back pain  . Heart disease Father        stent    Social History   Social History  . Marital status: Married    Spouse name: N/A  . Number of children: N/A  .  Years of education: N/A   Occupational History  . Not on file.   Social History Main Topics  . Smoking status: Former Smoker    Packs/day: 0.50    Years: 20.00    Types: Cigarettes    Quit date: 07/12/1992  . Smokeless tobacco: Never Used  . Alcohol use Yes     Comment: occasionaly  . Drug use: No  . Sexual activity: Yes    Partners: Male   Other Topics Concern  . Not on file   Social History Narrative  . No narrative on file    Outpatient Medications Prior to Visit  Medication Sig Dispense Refill  . albuterol (PROVENTIL HFA;VENTOLIN HFA) 108 (90 BASE) MCG/ACT inhaler Inhale 2 puffs into the lungs every 6 (six) hours as needed for wheezing or shortness of breath. 1 Inhaler 2  . BIOTIN PO Take by  mouth.    . Calcium Carbonate-Vitamin D (CALTRATE 600+D) 600-400 MG-UNIT per tablet Take 1 tablet by mouth daily.     . Cholecalciferol (VITAMIN D3) 1000 UNITS CAPS Take by mouth.      . colesevelam (WELCHOL) 625 MG tablet Take 2 tablets by mouth at bedtime 60 tablet 2  . FLAXSEED, LINSEED, PO Take by mouth every other day.     . fluticasone (FLONASE) 50 MCG/ACT nasal spray PLACE 2 SPRAYS INTO BOTH NOSTRILS DAILY. 16 g 6  . furosemide (LASIX) 40 MG tablet 1 tab po daily and a second tab daily prn increased edema or weight gain > 3#/24 hours. 120 tablet 1  . hydrOXYzine (ATARAX/VISTARIL) 50 MG tablet Take 50 mg by mouth every 6 (six) hours as needed.    . lamoTRIgine (LAMICTAL) 150 MG tablet Take 150 mg by mouth daily.    . Nutritional Supplements (ESTROVEN PO) Take by mouth.    Marland Kitchen omeprazole (PRILOSEC) 40 MG capsule Take 1 capsule (40 mg total) by mouth daily. 30 capsule 5  . potassium chloride SA (K-DUR,KLOR-CON) 20 MEQ tablet Take 1 tablet (20 mEq total) by mouth daily. And a 2nd tab dialy prn 2 tabs of Lasix in a day 100 tablet 1  . rOPINIRole (REQUIP) 1 MG tablet Take 1 mg by mouth daily.      . rosuvastatin (CRESTOR) 20 MG tablet Take 1 tablet (20 mg total) by mouth daily. Failed Atorvastatin and Simvastatin 30 tablet 5  . baclofen (LIORESAL) 20 MG tablet TAKE 1 TABLET (20 MG) BY MOUTH AT BEDTIME AS NEEDED FOR MUSCLE SPASMS. 90 tablet 5  . diazepam (VALIUM) 10 MG tablet Take 1 tablet (10 mg total) by mouth daily as needed for anxiety. 40 tablet 1  . HYDROcodone-acetaminophen (NORCO) 7.5-325 MG tablet Take 1 tablet by mouth every 6 (six) hours as needed for moderate pain. 30 tablet 0  . promethazine (PHENERGAN) 25 MG tablet Take 1 tablet (25 mg total) by mouth every 8 (eight) hours as needed for nausea. 40 tablet 1  . ranitidine (ZANTAC) 300 MG capsule Take 1 capsule (300 mg total) by mouth every evening. 30 capsule 5  . conjugated estrogens (PREMARIN) vaginal cream Place vaginally as directed.  3 times a week at bedtime     No facility-administered medications prior to visit.     Allergies  Allergen Reactions  . Carbamazepine Hives  . Ceclor [Cefaclor]     Tongue swell  . Morphine And Related Itching  . Atorvastatin Other (See Comments)    Leg pain    Review of Systems  Constitutional: Positive for malaise/fatigue.  Negative for fever.  HENT: Negative for congestion.   Eyes: Negative for blurred vision.  Respiratory: Negative for cough and shortness of breath.   Cardiovascular: Negative for chest pain, palpitations and leg swelling.  Gastrointestinal: Negative for vomiting.  Musculoskeletal: Positive for joint pain. Negative for back pain.  Skin: Positive for itching and rash.  Neurological: Negative for loss of consciousness and headaches.  Psychiatric/Behavioral: Positive for depression. The patient is nervous/anxious and has insomnia.        Objective:    Physical Exam  Constitutional: She is oriented to person, place, and time. She appears well-developed and well-nourished. No distress.  HENT:  Head: Normocephalic and atraumatic.  Eyes: Conjunctivae are normal.  Neck: Normal range of motion. No thyromegaly present.  Cardiovascular: Normal rate and regular rhythm.   Pulmonary/Chest: Effort normal and breath sounds normal. She has no wheezes.  Abdominal: Soft. Bowel sounds are normal. There is no tenderness.  Musculoskeletal: Normal range of motion. She exhibits no edema or deformity.  Neurological: She is alert and oriented to person, place, and time.  Skin: Skin is warm and dry. Rash noted. She is not diaphoretic.  Raised scaly rash on left hand. Discolored violaceous rash right arm for years.   Psychiatric: She has a normal mood and affect.    BP 126/66 (BP Location: Left Arm, Patient Position: Sitting, Cuff Size: Normal)   Pulse 68   Temp 97.7 F (36.5 C) (Oral)   Resp 18   Ht 5\' 5"  (1.651 m)   Wt 216 lb 9.6 oz (98.2 kg)   LMP 10/03/2012   SpO2  97%   BMI 36.04 kg/m  Wt Readings from Last 3 Encounters:  04/26/17 216 lb 9.6 oz (98.2 kg)  03/17/17 215 lb (97.5 kg)  09/01/16 208 lb (94.3 kg)   BP Readings from Last 3 Encounters:  04/26/17 126/66  03/17/17 104/60  09/01/16 (!) 105/58     Immunization History  Administered Date(s) Administered  . Influenza Split 05/06/2011, 05/02/2012  . Influenza,inj,Quad PF,6+ Mos 04/10/2013, 04/16/2014, 04/18/2015, 04/20/2016  . PPD Test 10/09/2013  . Pneumococcal Conjugate-13 04/10/2013  . Tdap 11/10/2010    Health Maintenance  Topic Date Due  . INFLUENZA VACCINE  02/09/2017  . MAMMOGRAM  09/15/2018  . COLONOSCOPY  02/07/2019  . PAP SMEAR  09/02/2019  . TETANUS/TDAP  11/09/2020  . Hepatitis C Screening  Completed  . HIV Screening  Completed    Lab Results  Component Value Date   WBC 6.5 07/16/2016   HGB 14.1 07/16/2016   HCT 41.7 07/16/2016   PLT 244.0 07/16/2016   GLUCOSE 97 03/17/2017   CHOL 169 07/16/2016   TRIG 268.0 (H) 07/16/2016   HDL 54.90 07/16/2016   LDLDIRECT 83.0 07/16/2016   LDLCALC 117 (H) 04/18/2014   ALT 47 (H) 03/17/2017   AST 32 03/17/2017   NA 140 03/17/2017   K 4.1 03/17/2017   CL 104 03/17/2017   CREATININE 0.85 03/17/2017   BUN 10 03/17/2017   CO2 29 03/17/2017   TSH 1.22 07/16/2016   HGBA1C 5.3 07/16/2016    Lab Results  Component Value Date   TSH 1.22 07/16/2016   Lab Results  Component Value Date   WBC 6.5 07/16/2016   HGB 14.1 07/16/2016   HCT 41.7 07/16/2016   MCV 84.6 07/16/2016   PLT 244.0 07/16/2016   Lab Results  Component Value Date   NA 140 03/17/2017   K 4.1 03/17/2017   CO2 29 03/17/2017   GLUCOSE  97 03/17/2017   BUN 10 03/17/2017   CREATININE 0.85 03/17/2017   BILITOT 0.6 03/17/2017   ALKPHOS 67 03/17/2017   AST 32 03/17/2017   ALT 47 (H) 03/17/2017   PROT 6.9 03/17/2017   ALBUMIN 4.5 03/17/2017   CALCIUM 9.6 03/17/2017   ANIONGAP 13 01/22/2015   GFR 72.71 03/17/2017   Lab Results  Component Value Date     CHOL 169 07/16/2016   Lab Results  Component Value Date   HDL 54.90 07/16/2016   Lab Results  Component Value Date   LDLCALC 117 (H) 04/18/2014   Lab Results  Component Value Date   TRIG 268.0 (H) 07/16/2016   Lab Results  Component Value Date   CHOLHDL 3 07/16/2016   Lab Results  Component Value Date   HGBA1C 5.3 07/16/2016         Assessment & Plan:   Problem List Items Addressed This Visit    Depression with anxiety    Was following with Dr Caprice Beaver but is now with Pauline Good, MSN, FNP-C at [facility]. Overall feeling stable and doing well      Relevant Medications   diazepam (VALIUM) 10 MG tablet   Hyperlipidemia    Encouraged heart healthy diet, increase exercise, avoid trans fats, consider a krill oil cap daily      Relevant Orders   Lipid panel   TSH   Obese    Encouraged DASH diet, decrease po intake and increase exercise as tolerated. Needs 7-8 hours of sleep nightly. Avoid trans fats, eat small, frequent meals every 4-5 hours with lean proteins, complex carbs and healthy fats. Minimize simple carbs, bariatric referral      Insomnia   Relevant Medications   diazepam (VALIUM) 10 MG tablet   Nausea   Relevant Medications   promethazine (PHENERGAN) 25 MG tablet   Other Relevant Orders   CBC   Comprehensive metabolic panel   RLS (restless legs syndrome)    On requip prescribed by psychiatry      Preventative health care    Patient encouraged to maintain heart healthy diet, regular exercise, adequate sleep. Consider daily probiotics. Take medications as prescribed. Labs ordered. Given and reviewed copy of ACP documents from [employer] and encouraged to complete and return. Dexa was normal in 2016 repeat in 2021, colonoscopy unremarkable 2010 repat 2020, MGM 3/18 repeat in 1 year. Pap with GYN 2/18 normal repeat in 2021.       Hip pain, left    Offered referral to sports med she will call if worsens. Given refill on the  Hydrocodone to use sparingly, UDS obtained. Contract in place      Sleep apnea    Last testing about 4 years. Snoring is present but no witnessed apnea.       Hyperglycemia    hgba1c acceptable, minimize simple carbs. Increase exercise as tolerated. Pneumovax given today      Relevant Orders   Hemoglobin A1c   Comprehensive metabolic panel   TSH   Tinea corporis    On left hand Lotrisone cream given       Relevant Medications   clotrimazole-betamethasone (LOTRISONE) cream    Other Visit Diagnoses    High risk medication use    -  Primary   Relevant Orders   Pain Mgmt, Profile 8 w/Conf, U   Acute bronchitis, unspecified organism       Relevant Medications   promethazine (PHENERGAN) 25 MG tablet   Needs  flu shot       Relevant Orders   Flu Vaccine QUAD 6+ mos PF IM (Fluarix Quad PF)      I have discontinued Ms. Mikula's conjugated estrogens. I am also having her start on clotrimazole-betamethasone. Additionally, I am having her maintain her rOPINIRole, Calcium Carbonate-Vitamin D, (FLAXSEED, LINSEED, PO), Vitamin D3, BIOTIN PO, Nutritional Supplements (ESTROVEN PO), hydrOXYzine, albuterol, fluticasone, colesevelam, lamoTRIgine, potassium chloride SA, furosemide, rosuvastatin, omeprazole, baclofen, diazepam, HYDROcodone-acetaminophen, promethazine, and ranitidine.  Meds ordered this encounter  Medications  . baclofen (LIORESAL) 20 MG tablet    Sig: TAKE 1 TABLET (20 MG) BY MOUTH AT BEDTIME AS NEEDED FOR MUSCLE SPASMS.    Dispense:  90 tablet    Refill:  3  . diazepam (VALIUM) 10 MG tablet    Sig: Take 1 tablet (10 mg total) by mouth daily as needed for anxiety.    Dispense:  40 tablet    Refill:  1  . HYDROcodone-acetaminophen (NORCO) 7.5-325 MG tablet    Sig: Take 1 tablet by mouth every 6 (six) hours as needed for moderate pain.    Dispense:  30 tablet    Refill:  0  . promethazine (PHENERGAN) 25 MG tablet    Sig: Take 1 tablet (25 mg total) by mouth every 8 (eight)  hours as needed for nausea.    Dispense:  40 tablet    Refill:  3  . ranitidine (ZANTAC) 300 MG capsule    Sig: Take 1 capsule (300 mg total) by mouth every evening.    Dispense:  30 capsule    Refill:  5  . clotrimazole-betamethasone (LOTRISONE) cream    Sig: Apply 1 application topically 2 (two) times daily.    Dispense:  45 g    Refill:  01    CMA served as scribe during this visit. History, Physical and Plan performed by medical provider. Documentation and orders reviewed and attested to.  Penni Homans, MD

## 2017-04-26 NOTE — Telephone Encounter (Signed)
PA initiated via Covermymeds; KEY: [id number]. Awaiting determination.

## 2017-04-27 LAB — TSH: TSH: 1.42 u[IU]/mL (ref 0.35–4.50)

## 2017-04-27 LAB — COMPREHENSIVE METABOLIC PANEL
ALK PHOS: 66 U/L (ref 39–117)
ALT: 28 U/L (ref 0–35)
AST: 19 U/L (ref 0–37)
Albumin: 4.5 g/dL (ref 3.5–5.2)
BUN: 16 mg/dL (ref 6–23)
CO2: 26 meq/L (ref 19–32)
Calcium: 9.7 mg/dL (ref 8.4–10.5)
Chloride: 105 mEq/L (ref 96–112)
Creatinine, Ser: 0.86 mg/dL (ref 0.40–1.20)
GFR: 71.71 mL/min (ref >60.00)
GLUCOSE: 85 mg/dL (ref 70–99)
POTASSIUM: 5.1 meq/L (ref 3.5–5.1)
SODIUM: 141 meq/L (ref 135–145)
TOTAL PROTEIN: 6.9 g/dL (ref 6.0–8.3)
Total Bilirubin: 0.6 mg/dL (ref 0.2–1.2)

## 2017-04-27 LAB — CBC
HCT: 43.6 % (ref 36.0–46.0)
Hemoglobin: 14.3 g/dL (ref 12.0–15.0)
MCHC: 32.8 g/dL (ref 30.0–36.0)
MCV: 88.2 fl (ref 78.0–100.0)
PLATELETS: 229 10*3/uL (ref 150.0–400.0)
RBC: 4.94 Mil/uL (ref 3.87–5.11)
RDW: 13.6 % (ref 11.5–15.5)
WBC: 6.8 10*3/uL (ref 4.0–10.5)

## 2017-04-27 LAB — LIPID PANEL
CHOL/HDL RATIO: 4
Cholesterol: 175 mg/dL (ref 0–200)
HDL: 49.4 mg/dL (ref >39.00)
NONHDL: 125.57
Triglycerides: 285 mg/dL — ABNORMAL HIGH (ref 0.0–149.0)
VLDL: 57 mg/dL — AB (ref 0.0–40.0)

## 2017-04-27 LAB — LDL CHOLESTEROL, DIRECT: LDL DIRECT: 86 mg/dL

## 2017-04-27 LAB — HEMOGLOBIN A1C: HEMOGLOBIN A1C: 5.5 % (ref 4.6–6.5)

## 2017-04-27 MED FILL — diazePAM 10 MG TABS: 10 | 40 days supply | Qty: 40 | Fill #0

## 2017-04-27 NOTE — Telephone Encounter (Signed)
[name] -- [employer]  diazepam (VALIUM) 10 MG tablet   [name] called to say that this medication was approved on 04/26/17 for 1 year and she will fax over the approval.

## 2017-04-27 NOTE — Telephone Encounter (Signed)
[phone]  [employer] called to get some more information for this PA

## 2017-04-27 NOTE — Telephone Encounter (Signed)
Spoke w/ Fransisco Beau at [facility], PA questions answered. PA approved, effective from 04/26/2017 through 04/26/2018.

## 2017-05-04 DIAGNOSIS — F3181 Bipolar II disorder: Secondary | ICD-10-CM | POA: Diagnosis not present

## 2017-05-09 LAB — PAIN MGMT, PROFILE 8 W/CONF, U
6 ACETYLMORPHINE: NEGATIVE ng/mL (ref <10)
ALCOHOL METABOLITES: POSITIVE ng/mL — AB (ref <500)
ALPHAHYDROXYALPRAZOLAM: NEGATIVE ng/mL (ref <25)
ALPHAHYDROXYMIDAZOLAM: NEGATIVE ng/mL (ref <50)
ALPHAHYDROXYTRIAZOLAM: NEGATIVE ng/mL (ref <50)
Aminoclonazepam: NEGATIVE ng/mL (ref <25)
Amphetamines: NEGATIVE ng/mL (ref <500)
BENZODIAZEPINES: POSITIVE ng/mL — AB (ref <100)
BUPRENORPHINE, URINE: NEGATIVE ng/mL (ref <5)
Cocaine Metabolite: NEGATIVE ng/mL (ref <150)
Codeine: NEGATIVE ng/mL (ref <50)
Creatinine: 62.2 mg/dL
Ethyl Glucuronide (ETG): 4357 ng/mL — ABNORMAL HIGH (ref <500)
Ethyl Sulfate (ETS): 1132 ng/mL — ABNORMAL HIGH (ref <100)
HYDROCODONE: 419 ng/mL — AB (ref <50)
HYDROMORPHONE: NEGATIVE ng/mL (ref <50)
HYDROXYETHYLFLURAZEPAM: NEGATIVE ng/mL (ref <50)
Lorazepam: NEGATIVE ng/mL (ref <50)
MARIJUANA METABOLITE: NEGATIVE ng/mL (ref <20)
MDMA: NEGATIVE ng/mL (ref <500)
MORPHINE: NEGATIVE ng/mL (ref <50)
NORDIAZEPAM: 141 ng/mL — AB (ref <50)
NORHYDROCODONE: 261 ng/mL — AB (ref <50)
OXAZEPAM: 290 ng/mL — AB (ref <50)
Opiates: POSITIVE ng/mL — AB (ref <100)
Oxidant: NEGATIVE ug/mL (ref <200)
Oxycodone: NEGATIVE ng/mL (ref <100)
Temazepam: 327 ng/mL — ABNORMAL HIGH (ref <50)
pH: 7.43 (ref 4.5–9.0)

## 2017-05-18 MED FILL — ROSUVASTATIN CALCIUM 20 MG: 20 | 30 days supply | Qty: 30 | Fill #1

## 2017-05-18 MED FILL — OMEPRAZOLE DR 40 MG CAPSULE: 40 | 30 days supply | Qty: 30 | Fill #0

## 2017-06-15 MED FILL — OMEPRAZOLE DR 40 MG CAPSULE: 40 | 30 days supply | Qty: 30 | Fill #1

## 2017-06-15 MED FILL — ROSUVASTATIN CALCIUM 20 MG: 20 | 30 days supply | Qty: 30 | Fill #2

## 2017-07-13 MED FILL — BACLOFEN 20 MG TABS: 20 | 90 days supply | Qty: 90 | Fill #3

## 2017-07-13 MED FILL — OMEPRAZOLE DR 40 MG CAPSULE: 40 | 30 days supply | Qty: 30 | Fill #2

## 2017-07-13 MED FILL — diazePAM 10 MG TABS: 10 | 40 days supply | Qty: 40 | Fill #1

## 2017-07-13 MED FILL — ROSUVASTATIN CALCIUM 20 MG: 20 | 30 days supply | Qty: 30 | Fill #3

## 2017-07-25 ENCOUNTER — Other Ambulatory Visit: Payer: Medicare Other

## 2017-07-25 MED FILL — POTASSIUM CL ER 20 MEQ TABL: 20 | 50 days supply | Qty: 100 | Fill #1

## 2017-07-27 ENCOUNTER — Other Ambulatory Visit: Payer: Self-pay | Admitting: Family Medicine

## 2017-07-27 ENCOUNTER — Other Ambulatory Visit: Payer: Medicare Other

## 2017-07-27 NOTE — Telephone Encounter (Signed)
Copied from [place] [phone]. Topic: Quick Communication - See Telephone Encounter >> Jul 27, 2017  2:24 PM [name] wrote: CRM for notification. See Telephone encounter for:  07/27/17.   Pt came in the office for her lab appt, pt stated that called our office on Monday requesting needing rx for HYDROcodone-acetaminophen ([place]) 7.5-325 MG tablet but the person who answered stated that pt needed to call the  Pharmacy to request her meds, pt called the pharmacy same day (Monday 07-25-2017) and stated that is needing med refill on Hydrocodone (on pt's chart there is no notes requesting rx) pt is needing rx for Hydrocodone ASAP. Please advise.

## 2017-07-28 ENCOUNTER — Telehealth: Payer: Self-pay

## 2017-07-28 DIAGNOSIS — Z79899 Other long term (current) drug therapy: Secondary | ICD-10-CM

## 2017-07-28 NOTE — Telephone Encounter (Signed)
Requesting: Hydrocodone 7.5-325 MG Contract:YES UDS: 04/26/17  Moderate Risk Last OV: 04/26/17 Next OV: 05/09/2018 Last Refill: 04/26/17 #30   Please advise

## 2017-07-28 NOTE — Telephone Encounter (Signed)
Called patient left message to call back   Orders placed for UDS

## 2017-07-28 NOTE — Telephone Encounter (Signed)
He needs a return office visit before a next prescription after this but can have a refill if she does a UDS now.

## 2017-08-01 ENCOUNTER — Other Ambulatory Visit: Payer: Medicare Other

## 2017-08-01 ENCOUNTER — Other Ambulatory Visit: Payer: Self-pay

## 2017-08-01 ENCOUNTER — Other Ambulatory Visit: Payer: Self-pay | Admitting: Family Medicine

## 2017-08-01 DIAGNOSIS — Z79899 Other long term (current) drug therapy: Secondary | ICD-10-CM

## 2017-08-01 DIAGNOSIS — G8929 Other chronic pain: Secondary | ICD-10-CM

## 2017-08-01 MED ORDER — HYDROCODONE-ACETAMINOPHEN 7.5-325 MG PO TABS: 1.0000 | ORAL_TABLET | Freq: Four times a day (QID) | ORAL | 0 refills | Status: DC | PRN

## 2017-08-01 NOTE — Telephone Encounter (Signed)
Requesting: NORCO 7.5-325 MG Contract: 11/11/16 UDS: MODERATE RISK last screen was 02/11/17 Last OV: 04/26/17 Next OV: 05/09/18 Last Refill: 04/26/17   Please advise

## 2017-08-01 NOTE — Telephone Encounter (Signed)
Thank you :)

## 2017-08-01 NOTE — Telephone Encounter (Signed)
Disregard

## 2017-08-01 NOTE — Progress Notes (Signed)
Indication for chronic opioid: chronic pain Medication and dose: norco 7,5/325 # pills per month: 30 Last UDS date: today Pain contract signed (Y/N): yes Date narcotic database last reviewed (include red flags): 08/01/2017  Refill x1

## 2017-08-01 NOTE — Telephone Encounter (Signed)
She needs a new UDS, then can have refill but she will need an appointment by April if she wants to continue medicaiton

## 2017-08-01 NOTE — Telephone Encounter (Signed)
Patient is in office now.  She is crying because her rx is not ready.  Per Dr. Charlett Blake she is due for UDS and as soon as we get it she can get rx.  Can you fill in Dr. Rhae Lerner absence.  She would like it sent to [facility].  She will be in for an OV to talk with Dr. Charlett Blake about her medications going forward.

## 2017-08-01 NOTE — Telephone Encounter (Signed)
Requesting:Norco Contract:yes OIL:[id number] risk next screen 07/28/17 Last OV:04/26/17 Next OV:08/18/17 Last Refill:04/26/17  #[id number]   Please advise

## 2017-08-01 NOTE — Telephone Encounter (Signed)
Pt should not be on bzd and opiates-----  She will need to discuss this with Dr Randel Pigg ' I will refill pain med in dr blythes absence

## 2017-08-05 LAB — PAIN MGMT, PROFILE 8 W/CONF, U
6 Acetylmorphine: NEGATIVE ng/mL (ref <10)
ALPHAHYDROXYALPRAZOLAM: NEGATIVE ng/mL (ref <25)
ALPHAHYDROXYMIDAZOLAM: NEGATIVE ng/mL (ref <50)
ALPHAHYDROXYTRIAZOLAM: NEGATIVE ng/mL (ref <50)
AMPHETAMINES: NEGATIVE ng/mL (ref <500)
Alcohol Metabolites: POSITIVE ng/mL — AB (ref <500)
Aminoclonazepam: NEGATIVE ng/mL (ref <25)
BUPRENORPHINE, URINE: NEGATIVE ng/mL (ref <5)
Benzodiazepines: POSITIVE ng/mL — AB (ref <100)
COCAINE METABOLITE: NEGATIVE ng/mL (ref <150)
CREATININE: 21.7 mg/dL
ETHYL GLUCURONIDE (ETG): 1378 ng/mL — AB (ref <500)
Ethyl Sulfate (ETS): 361 ng/mL — ABNORMAL HIGH (ref <100)
Hydroxyethylflurazepam: NEGATIVE ng/mL (ref <50)
Lorazepam: NEGATIVE ng/mL (ref <50)
MDMA: NEGATIVE ng/mL (ref <500)
Marijuana Metabolite: NEGATIVE ng/mL (ref <20)
NORDIAZEPAM: 90 ng/mL — AB (ref <50)
OPIATES: NEGATIVE ng/mL (ref <100)
OXAZEPAM: 155 ng/mL — AB (ref <50)
OXIDANT: NEGATIVE ug/mL (ref <200)
OXYCODONE: NEGATIVE ng/mL (ref <100)
PH: 6.76 (ref 4.5–9.0)
Temazepam: 191 ng/mL — ABNORMAL HIGH (ref <50)

## 2017-08-17 MED FILL — ROSUVASTATIN CALCIUM 20 MG: 20 | 30 days supply | Qty: 30 | Fill #4

## 2017-08-17 MED FILL — OMEPRAZOLE DR 40 MG CAPSULE: 40 | 30 days supply | Qty: 30 | Fill #3

## 2017-08-18 ENCOUNTER — Encounter: Payer: Self-pay | Admitting: Family Medicine

## 2017-08-18 ENCOUNTER — Ambulatory Visit (INDEPENDENT_AMBULATORY_CARE_PROVIDER_SITE_OTHER): Payer: Medicare Other | Admitting: Family Medicine

## 2017-08-18 ENCOUNTER — Ambulatory Visit: Payer: Medicare Other | Admitting: Family Medicine

## 2017-08-18 DIAGNOSIS — F418 Other specified anxiety disorders: Secondary | ICD-10-CM | POA: Diagnosis not present

## 2017-08-18 DIAGNOSIS — M25552 Pain in left hip: Secondary | ICD-10-CM | POA: Diagnosis not present

## 2017-08-18 DIAGNOSIS — E782 Mixed hyperlipidemia: Secondary | ICD-10-CM | POA: Diagnosis not present

## 2017-08-18 DIAGNOSIS — R739 Hyperglycemia, unspecified: Secondary | ICD-10-CM

## 2017-08-18 MED ORDER — ROSUVASTATIN CALCIUM 20 MG PO TABS: 20.0000 mg | ORAL_TABLET | Freq: Every day | ORAL | 1 refills | Status: DC

## 2017-08-18 MED ORDER — OMEPRAZOLE 40 MG PO CPDR: 40.0000 mg | DELAYED_RELEASE_CAPSULE | Freq: Every day | ORAL | 1 refills | Status: DC

## 2017-08-18 NOTE — Progress Notes (Signed)
Subjective:  I acted as a Education administrator for Dr. Charlett Blake. [name], [place]  Patient ID: [name], female    DOB: 1955/12/15, 62 y.o.   MRN: [id number]  No chief complaint on file.   HPI  Patient is in today for a medication follow up she has decided to stop her hydrocodone to manage her pain and overall she is managing her pain well. No significant consequences. No new concerns. No recent febrile illness or hospitalizations. Denies CP/palp/SOB/HA/congestion/fevers/GI or GU c/o. Taking meds as prescribed  Patient Care Team: Mosie Lukes, MD as PCP - General (Family Medicine) Inda Castle, MD (Inactive) as Consulting Physician (Gastroenterology) Ralene Bathe, MD as Consulting Physician (Ophthalmology)   Past Medical History:  Diagnosis Date  . Acute sinusitis 03/27/2013  . Allergy    seasonal  . Anxiety   . Arthritis 06/09/2013  . Depression   . Dyspareunia   . Edema extremities   . Encounter for preventative adult health care exam with abnormal findings 04/21/2014  . GERD (gastroesophageal reflux disease)   . Hip pain, left 11/01/2011  . History of proteinuria syndrome   . Hyperlipidemia   . Insomnia 03/11/2013  . Low back pain 05/09/2011  . Lymphadenitis 06/16/2012  . Myalgia and myositis 02/15/2015  . Obese   . Onychomycosis 05/09/2011  . Oral lesion 08/26/2013  . Otitis externa 06/23/2012   left  . Perimenopause 03/11/2013  . RLS (restless legs syndrome)   . RUQ pain 09/20/2011  . Sleep apnea 04/21/2014  . Tinea corporis 04/26/2017  . Unspecified constipation 05/13/2013  . Urinary frequency 08/26/2013    Past Surgical History:  Procedure Laterality Date  . BACK SURGERY  2006   low, discectomy for herniated disc  . BLADDER SUSPENSION N/A 11/07/2012   Procedure: [facility];  Surgeon: Malka So, MD;  Location: [facility];  Service: Urology;  Laterality: N/A;  . CARPAL TUNNEL RELEASE Right 2006  . CESAREAN SECTION  1993  . CYSTOSCOPY N/A  11/07/2012   Procedure: CYSTOSCOPY;  Surgeon: Malka So, MD;  Location: [facility];  Service: Urology;  Laterality: N/A;  . TUBAL LIGATION  1993    Family History  Problem Relation Age of Onset  . Cancer Mother 4       thyroid, malignant brain tumor  . Arthritis Mother        neck, back  . Hepatitis Sister        Hepatitis C  . Heart disease Sister        stent  . Diabetes Maternal Grandmother   . Arthritis Father        back pain  . Heart disease Father        stent    Social History   Socioeconomic History  . Marital status: Married    Spouse name: Not on file  . Number of children: Not on file  . Years of education: Not on file  . Highest education level: Not on file  Social Needs  . Financial resource strain: Not on file  . Food insecurity - worry: Not on file  . Food insecurity - inability: Not on file  . Transportation needs - medical: Not on file  . Transportation needs - non-medical: Not on file  Occupational History  . Not on file  Tobacco Use  . Smoking status: Former Smoker    Packs/day: 0.50    Years: 20.00    Pack years: 10.00  Types: Cigarettes    Last attempt to quit: 07/12/1992    Years since quitting: 25.1  . Smokeless tobacco: Never Used  Substance and Sexual Activity  . Alcohol use: Yes    Comment: occasionaly  . Drug use: No  . Sexual activity: Yes    Partners: Male  Other Topics Concern  . Not on file  Social History Narrative  . Not on file    Outpatient Medications Prior to Visit  Medication Sig Dispense Refill  . albuterol (PROVENTIL HFA;VENTOLIN HFA) 108 (90 BASE) MCG/ACT inhaler Inhale 2 puffs into the lungs every 6 (six) hours as needed for wheezing or shortness of breath. 1 Inhaler 2  . baclofen (LIORESAL) 20 MG tablet TAKE 1 TABLET (20 MG) BY MOUTH AT BEDTIME AS NEEDED FOR MUSCLE SPASMS. 90 tablet 3  . BIOTIN PO Take by mouth.    . Calcium Carbonate-Vitamin D (CALTRATE 600+D) 600-400 MG-UNIT per tablet  Take 1 tablet by mouth daily.     . Cholecalciferol (VITAMIN D3) 1000 UNITS CAPS Take by mouth.      . clotrimazole-betamethasone (LOTRISONE) cream Apply 1 application topically 2 (two) times daily. 45 g 01  . colesevelam (WELCHOL) 625 MG tablet Take 2 tablets by mouth at bedtime 60 tablet 2  . diazepam (VALIUM) 10 MG tablet Take 1 tablet (10 mg total) by mouth daily as needed for anxiety. 40 tablet 1  . FLAXSEED, LINSEED, PO Take by mouth every other day.     . fluticasone (FLONASE) 50 MCG/ACT nasal spray PLACE 2 SPRAYS INTO BOTH NOSTRILS DAILY. 16 g 6  . furosemide (LASIX) 40 MG tablet 1 tab po daily and a second tab daily prn increased edema or weight gain > 3#/24 hours. 120 tablet 1  . hydrOXYzine (ATARAX/VISTARIL) 50 MG tablet Take 50 mg by mouth every 6 (six) hours as needed.    . lamoTRIgine (LAMICTAL) 150 MG tablet Take 150 mg by mouth daily.    . Nutritional Supplements (ESTROVEN PO) Take by mouth.    . potassium chloride SA (K-DUR,KLOR-CON) 20 MEQ tablet Take 1 tablet (20 mEq total) by mouth daily. And a 2nd tab dialy prn 2 tabs of Lasix in a day 100 tablet 1  . promethazine (PHENERGAN) 25 MG tablet Take 1 tablet (25 mg total) by mouth every 8 (eight) hours as needed for nausea. 40 tablet 3  . ranitidine (ZANTAC) 300 MG capsule Take 1 capsule (300 mg total) by mouth every evening. 30 capsule 5  . rOPINIRole (REQUIP) 1 MG tablet Take 1 mg by mouth daily.      Marland Kitchen omeprazole (PRILOSEC) 40 MG capsule Take 1 capsule (40 mg total) by mouth daily. 30 capsule 5  . rosuvastatin (CRESTOR) 20 MG tablet Take 1 tablet (20 mg total) by mouth daily. Failed Atorvastatin and Simvastatin 30 tablet 5  . HYDROcodone-acetaminophen (NORCO) 7.5-325 MG tablet Take 1 tablet by mouth every 6 (six) hours as needed for moderate pain. 30 tablet 0   No facility-administered medications prior to visit.     Allergies  Allergen Reactions  . Carbamazepine Hives  . Ceclor [Cefaclor]     Tongue swell  . Morphine And  Related Itching  . Atorvastatin Other (See Comments)    Leg pain    Review of Systems  Constitutional: Positive for malaise/fatigue. Negative for fever.  HENT: Negative for congestion.   Eyes: Negative for blurred vision.  Respiratory: Negative for shortness of breath.   Cardiovascular: Negative for chest pain, palpitations and  leg swelling.  Gastrointestinal: Negative for abdominal pain, blood in stool and nausea.  Genitourinary: Negative for dysuria and frequency.  Musculoskeletal: Positive for back pain and joint pain. Negative for falls.  Skin: Negative for rash.  Neurological: Negative for dizziness, loss of consciousness and headaches.  Endo/Heme/Allergies: Negative for environmental allergies.  Psychiatric/Behavioral: Negative for depression. The patient is not nervous/anxious.        Objective:    Physical Exam  Constitutional: She is oriented to person, place, and time. She appears well-developed and well-nourished. No distress.  HENT:  Head: Normocephalic and atraumatic.  Nose: Nose normal.  Eyes: Right eye exhibits no discharge. Left eye exhibits no discharge.  Neck: Normal range of motion. Neck supple.  Cardiovascular: Normal rate and regular rhythm.  No murmur heard. Pulmonary/Chest: Effort normal and breath sounds normal.  Abdominal: Soft. Bowel sounds are normal. There is no tenderness.  Musculoskeletal: She exhibits no edema.  Neurological: She is alert and oriented to person, place, and time.  Skin: Skin is warm and dry.  Psychiatric: She has a normal mood and affect.  Nursing note and vitals reviewed.   BP 110/76 (BP Location: Left Arm, Patient Position: Sitting, Cuff Size: Normal)   Pulse 72   Temp (!) 97.5 F (36.4 C) (Oral)   Resp 18   Wt 210 lb (95.3 kg)   LMP 10/03/2012   SpO2 95%   BMI 34.95 kg/m  Wt Readings from Last 3 Encounters:  08/18/17 210 lb (95.3 kg)  04/26/17 216 lb 9.6 oz (98.2 kg)  03/17/17 215 lb (97.5 kg)   BP Readings from  Last 3 Encounters:  08/18/17 110/76  04/26/17 126/66  03/17/17 104/60     Immunization History  Administered Date(s) Administered  . Influenza Split 05/06/2011, 05/02/2012  . Influenza,inj,Quad PF,6+ Mos 04/10/2013, 04/16/2014, 04/18/2015, 04/20/2016, 04/26/2017  . PPD Test 10/09/2013  . Pneumococcal Conjugate-13 04/10/2013  . Pneumococcal Polysaccharide-23 04/26/2017  . Tdap 11/10/2010    Health Maintenance  Topic Date Due  . MAMMOGRAM  09/15/2018  . COLONOSCOPY  02/07/2019  . PAP SMEAR  09/02/2019  . TETANUS/TDAP  11/09/2020  . INFLUENZA VACCINE  Completed  . Hepatitis C Screening  Completed  . HIV Screening  Completed    Lab Results  Component Value Date   WBC 6.8 04/26/2017   HGB 14.3 04/26/2017   HCT 43.6 04/26/2017   PLT 229.0 04/26/2017   GLUCOSE 85 04/26/2017   CHOL 175 04/26/2017   TRIG 285.0 (H) 04/26/2017   HDL 49.40 04/26/2017   LDLDIRECT 86.0 04/26/2017   LDLCALC 117 (H) 04/18/2014   ALT 28 04/26/2017   AST 19 04/26/2017   NA 141 04/26/2017   K 5.1 04/26/2017   CL 105 04/26/2017   CREATININE 0.86 04/26/2017   BUN 16 04/26/2017   CO2 26 04/26/2017   TSH 1.42 04/26/2017   HGBA1C 5.5 04/26/2017    Lab Results  Component Value Date   TSH 1.42 04/26/2017   Lab Results  Component Value Date   WBC 6.8 04/26/2017   HGB 14.3 04/26/2017   HCT 43.6 04/26/2017   MCV 88.2 04/26/2017   PLT 229.0 04/26/2017   Lab Results  Component Value Date   NA 141 04/26/2017   K 5.1 04/26/2017   CO2 26 04/26/2017   GLUCOSE 85 04/26/2017   BUN 16 04/26/2017   CREATININE 0.86 04/26/2017   BILITOT 0.6 04/26/2017   ALKPHOS 66 04/26/2017   AST 19 04/26/2017   ALT 28 04/26/2017  PROT 6.9 04/26/2017   ALBUMIN 4.5 04/26/2017   CALCIUM 9.7 04/26/2017   ANIONGAP 13 01/22/2015   GFR 71.71 04/26/2017   Lab Results  Component Value Date   CHOL 175 04/26/2017   Lab Results  Component Value Date   HDL 49.40 04/26/2017   Lab Results  Component Value Date    LDLCALC 117 (H) 04/18/2014   Lab Results  Component Value Date   TRIG 285.0 (H) 04/26/2017   Lab Results  Component Value Date   CHOLHDL 4 04/26/2017   Lab Results  Component Value Date   HGBA1C 5.5 04/26/2017         Assessment & Plan:   Problem List Items Addressed This Visit    Depression with anxiety    Follows with psychiatry, doing well on current meds.       Hyperlipidemia    Encouraged heart healthy diet, increase exercise, avoid trans fats, consider a krill oil cap daily      Relevant Medications   rosuvastatin (CRESTOR) 20 MG tablet   Hip pain, left    Has stopped her narcotics and is managing her pain well. Encouraged Aleve and Tylenol bid prn. Stay as active as tolerated.       Hyperglycemia    hgba1c acceptable, minimize simple carbs. Increase exercise as tolerated. Continue current meds       Other Visit Diagnoses    Hyperlipidemia, mixed       Relevant Medications   rosuvastatin (CRESTOR) 20 MG tablet      I have discontinued Journiee S. Calvey's HYDROcodone-acetaminophen. I am also having her maintain her rOPINIRole, Calcium Carbonate-Vitamin D, (FLAXSEED, LINSEED, PO), Vitamin D3, BIOTIN PO, Nutritional Supplements (ESTROVEN PO), hydrOXYzine, albuterol, fluticasone, colesevelam, lamoTRIgine, potassium chloride SA, furosemide, baclofen, diazepam, promethazine, ranitidine, clotrimazole-betamethasone, rosuvastatin, and omeprazole.  Meds ordered this encounter  Medications  . rosuvastatin (CRESTOR) 20 MG tablet    Sig: Take 1 tablet (20 mg total) by mouth daily. Failed Atorvastatin and Simvastatin    Dispense:  90 tablet    Refill:  1  . omeprazole (PRILOSEC) 40 MG capsule    Sig: Take 1 capsule (40 mg total) by mouth daily.    Dispense:  90 capsule    Refill:  1    CMA served as scribe during this visit. History, Physical and Plan performed by medical provider. Documentation and orders reviewed and attested to.  Penni Homans, MD

## 2017-08-18 NOTE — Patient Instructions (Signed)
Shingrix is the new shingles shot, 2 shots over 2-6 months, check with insurance and confirm they will cover then go to pharmacy for shot and have them send [place] a copy Cholesterol Cholesterol is a fat. Your body needs a small amount of cholesterol. Cholesterol (plaque) may build up in your blood vessels (arteries). That makes you more likely to have a heart attack or stroke. You cannot feel your cholesterol level. Having a blood test is the only way to find out if your level is high. Keep your test results. Work with your doctor to keep your cholesterol at a good level. What do the results mean?  Total cholesterol is how much cholesterol is in your blood.  LDL is bad cholesterol. This is the type that can build up. Try to have low LDL.  HDL is good cholesterol. It cleans your blood vessels and carries LDL away. Try to have high HDL.  Triglycerides are fat that the body can store or burn for energy. What are good levels of cholesterol?  Total cholesterol below 200.  LDL below 100 is good for people who have health risks. LDL below 70 is good for people who have very high risks.  HDL above 40 is good. It is best to have HDL of 60 or higher.  Triglycerides below 150. How can I lower my cholesterol? Diet Follow your diet program as told by your doctor.  Choose fish, white meat chicken, or [place] that is roasted or baked. Try not to eat red meat, fried foods, sausage, or lunch meats.  Eat lots of fresh fruits and vegetables.  Choose whole grains, beans, pasta, potatoes, and cereals.  Choose olive oil, corn oil, or canola oil. Only use small amounts.  Try not to eat butter, mayonnaise, shortening, or palm kernel oils.  Try not to eat foods with trans fats.  Choose low-fat or nonfat dairy foods. ? Drink skim or nonfat milk. ? Eat low-fat or nonfat yogurt and cheeses. ? Try not to drink whole milk or cream. ? Try not to eat ice cream, egg yolks, or full-fat cheeses.  Healthy  desserts include angel food cake, ginger snaps, animal crackers, hard candy, popsicles, and low-fat or nonfat frozen yogurt. Try not to eat pastries, cakes, pies, and cookies.  Exercise Follow your exercise program as told by your doctor.  Be more active. Try gardening, walking, and taking the stairs.  Ask your doctor about ways that you can be more active.  Medicine  Take over-the-counter and prescription medicines only as told by your doctor. This information is not intended to replace advice given to you by your health care provider. Make sure you discuss any questions you have with your health care provider. Document Released: 09/24/2008 Document Revised: 01/28/2016 Document Reviewed: 01/08/2016 Elsevier Interactive Patient Education  [employer].

## 2017-08-21 NOTE — Assessment & Plan Note (Signed)
Has stopped her narcotics and is managing her pain well. Encouraged Aleve and Tylenol bid prn. Stay as active as tolerated.

## 2017-08-21 NOTE — Assessment & Plan Note (Signed)
Encouraged heart healthy diet, increase exercise, avoid trans fats, consider a krill oil cap daily 

## 2017-08-21 NOTE — Assessment & Plan Note (Signed)
hgba1c acceptable, minimize simple carbs. Increase exercise as tolerated. Continue current meds 

## 2017-08-21 NOTE — Assessment & Plan Note (Signed)
Follows with psychiatry, doing well on current meds.

## 2017-09-15 MED FILL — PROMETHAZINE 25 MG TABLET: 25 | 14 days supply | Qty: 40 | Fill #1

## 2017-09-15 MED FILL — OMEPRAZOLE DR 40 MG CAPSULE: 40 | 30 days supply | Qty: 30 | Fill #4

## 2017-09-15 MED FILL — ROSUVASTATIN CALCIUM 20 MG: 20 | 30 days supply | Qty: 30 | Fill #5

## 2017-09-26 MED FILL — BACLOFEN 20 MG TABS: 20 | 90 days supply | Qty: 90 | Fill #4

## 2017-10-13 MED FILL — OMEPRAZOLE DR 40 MG CAPSULE: 40 | 90 days supply | Qty: 90 | Fill #0

## 2017-10-13 MED FILL — ROSUVASTATIN CALCIUM 20 MG: 20 | 90 days supply | Qty: 90 | Fill #0

## 2017-10-17 DIAGNOSIS — L03032 Cellulitis of left toe: Secondary | ICD-10-CM | POA: Diagnosis not present

## 2017-10-17 DIAGNOSIS — M79674 Pain in right toe(s): Secondary | ICD-10-CM | POA: Diagnosis not present

## 2017-10-17 DIAGNOSIS — M79675 Pain in left toe(s): Secondary | ICD-10-CM | POA: Diagnosis not present

## 2017-10-31 DIAGNOSIS — M79674 Pain in right toe(s): Secondary | ICD-10-CM | POA: Diagnosis not present

## 2017-10-31 DIAGNOSIS — M79675 Pain in left toe(s): Secondary | ICD-10-CM | POA: Diagnosis not present

## 2017-11-28 DIAGNOSIS — G2581 Restless legs syndrome: Secondary | ICD-10-CM | POA: Diagnosis not present

## 2017-11-28 DIAGNOSIS — F3181 Bipolar II disorder: Secondary | ICD-10-CM | POA: Diagnosis not present

## 2017-12-08 ENCOUNTER — Other Ambulatory Visit: Payer: Self-pay | Admitting: Family Medicine

## 2017-12-08 MED FILL — BACLOFEN 20 MG TABS: 20 | 90 days supply | Qty: 90 | Fill #0

## 2017-12-09 MED FILL — POTASSIUM CL ER 20 MEQ TAB: 20 | 50 days supply | Qty: 100 | Fill #0

## 2017-12-22 ENCOUNTER — Ambulatory Visit: Payer: Medicare Other | Admitting: Family Medicine

## 2018-01-16 MED FILL — ROSUVASTATIN CALCIUM 20 MG: 20 | 90 days supply | Qty: 90 | Fill #1

## 2018-01-16 MED FILL — PROMETHAZINE 25 MG TABLET: 25 | 14 days supply | Qty: 40 | Fill #2

## 2018-01-16 MED FILL — OMEPRAZOLE 40 MG CPDR: 40 | 90 days supply | Qty: 90 | Fill #1

## 2018-01-16 MED FILL — raNITIdine HCL 300 MG TABS: 300 | 30 days supply | Qty: 30 | Fill #1

## 2018-01-31 DIAGNOSIS — F3181 Bipolar II disorder: Secondary | ICD-10-CM | POA: Diagnosis not present

## 2018-01-31 DIAGNOSIS — G2581 Restless legs syndrome: Secondary | ICD-10-CM | POA: Diagnosis not present

## 2018-04-06 MED FILL — PROMETHAZINE 25 MG TABLET: 25 | 14 days supply | Qty: 40 | Fill #3

## 2018-04-06 MED FILL — BACLOFEN 20 MG TABS: 20 | 90 days supply | Qty: 90 | Fill #1

## 2018-04-07 ENCOUNTER — Other Ambulatory Visit: Payer: Self-pay | Admitting: Family Medicine

## 2018-04-07 DIAGNOSIS — E782 Mixed hyperlipidemia: Secondary | ICD-10-CM

## 2018-04-10 MED FILL — ROSUVASTATIN CALCIUM 20 MG: 20 | 90 days supply | Qty: 90 | Fill #0

## 2018-05-04 ENCOUNTER — Other Ambulatory Visit: Payer: Self-pay | Admitting: Family Medicine

## 2018-05-04 DIAGNOSIS — Z1231 Encounter for screening mammogram for malignant neoplasm of breast: Secondary | ICD-10-CM

## 2018-05-09 ENCOUNTER — Ambulatory Visit (INDEPENDENT_AMBULATORY_CARE_PROVIDER_SITE_OTHER): Payer: Medicare Other | Admitting: Family Medicine

## 2018-05-09 VITALS — BP 102/64 | HR 81 | Temp 97.7°F | Resp 18 | Ht 65.0 in | Wt 225.6 lb

## 2018-05-09 DIAGNOSIS — Z0000 Encounter for general adult medical examination without abnormal findings: Secondary | ICD-10-CM | POA: Diagnosis not present

## 2018-05-09 DIAGNOSIS — R110 Nausea: Secondary | ICD-10-CM | POA: Diagnosis not present

## 2018-05-09 DIAGNOSIS — R600 Localized edema: Secondary | ICD-10-CM | POA: Diagnosis not present

## 2018-05-09 DIAGNOSIS — K219 Gastro-esophageal reflux disease without esophagitis: Secondary | ICD-10-CM

## 2018-05-09 DIAGNOSIS — J209 Acute bronchitis, unspecified: Secondary | ICD-10-CM | POA: Diagnosis not present

## 2018-05-09 DIAGNOSIS — M545 Low back pain, unspecified: Secondary | ICD-10-CM

## 2018-05-09 DIAGNOSIS — Z23 Encounter for immunization: Secondary | ICD-10-CM

## 2018-05-09 DIAGNOSIS — R739 Hyperglycemia, unspecified: Secondary | ICD-10-CM | POA: Diagnosis not present

## 2018-05-09 DIAGNOSIS — E782 Mixed hyperlipidemia: Secondary | ICD-10-CM

## 2018-05-09 DIAGNOSIS — R252 Cramp and spasm: Secondary | ICD-10-CM | POA: Diagnosis not present

## 2018-05-09 MED ORDER — ROSUVASTATIN CALCIUM 20 MG PO TABS: 20.0000 mg | ORAL_TABLET | Freq: Every day | ORAL | 1 refills | Status: DC

## 2018-05-09 MED ORDER — OMEPRAZOLE 40 MG PO CPDR: 40.0000 mg | DELAYED_RELEASE_CAPSULE | Freq: Every day | ORAL | 1 refills | Status: DC

## 2018-05-09 MED ORDER — POTASSIUM CHLORIDE CRYS ER 20 MEQ PO TBCR: EXTENDED_RELEASE_TABLET | ORAL | 1 refills | Status: DC

## 2018-05-09 MED ORDER — FUROSEMIDE 40 MG PO TABS: ORAL_TABLET | ORAL | 1 refills | Status: DC

## 2018-05-09 MED ORDER — BACLOFEN 20 MG PO TABS: ORAL_TABLET | ORAL | 3 refills | Status: DC

## 2018-05-09 MED ORDER — FAMOTIDINE 40 MG PO TABS: 40.0000 mg | ORAL_TABLET | Freq: Every day | ORAL | 1 refills | Status: DC

## 2018-05-09 MED ORDER — PROMETHAZINE HCL 25 MG PO TABS: 25.0000 mg | ORAL_TABLET | Freq: Three times a day (TID) | ORAL | 3 refills | Status: DC | PRN

## 2018-05-09 MED FILL — OMEPRAZOLE 40 MG CPDR: 40 | 90 days supply | Qty: 90 | Fill #0

## 2018-05-09 MED FILL — POTASSIUM CL ER 20 MEQ TAB: 20 | 50 days supply | Qty: 100 | Fill #0

## 2018-05-09 MED FILL — FUROSEMIDE 40 MG TAB: 40 | 60 days supply | Qty: 120 | Fill #0

## 2018-05-09 MED FILL — PROMETHAZINE 25 MG TABLET: 25 | 13 days supply | Qty: 40 | Fill #0

## 2018-05-09 MED FILL — FAMOTIDINE 40 MG TABLET: 40 | 90 days supply | Qty: 90 | Fill #0

## 2018-05-09 NOTE — Assessment & Plan Note (Signed)
Elevate feet above. Minimize sodium and use compression hose

## 2018-05-09 NOTE — Assessment & Plan Note (Signed)
Ran out of Prilosec 2 weeks ago and her symptoms have flared. Restart Prilosec

## 2018-05-09 NOTE — Assessment & Plan Note (Signed)
Recent flare and LLE radiculopathy but has improved with heat and rest. No falls or trauma. Encouraged moist heat and gentle stretching as tolerated. May try NSAIDs and prescription meds as directed and report if symptoms worsen or seek immediate care. Consider aspercreme with Lidocaine.

## 2018-05-09 NOTE — Progress Notes (Signed)
Subjective:    Patient ID: [name], female    DOB: November 01, 1955, 62 y.o.   MRN: [id number]  No chief complaint on file.   HPI Patient is in today for annual preventative exam and follow-up on chronic medical concerns including hyperglycemia, hyperlipidemia and chronic pain.  She feels well today but has had a significant worsening in her indigestion and reflux symptoms.  She notes current medications are not helpful and she has significant discomfort in the chest most nights with some dyspepsia.  She is also complaining of ongoing trouble with low back pain and bilateral lower extremity pain no recent febrile illness or hospitalization.  No recent fall or injury.  No new onset incontinence.  She tries to maintain a heart healthy diet and is as active as she can tolerate but due to her chronic pain does not exercise regularly.  Manages her activities of daily living most days.  No polydipsia reported. Denies CP/palp/SOB/HA/fevers or GU c/o. Taking meds as prescribed  Past Medical History:  Diagnosis Date  . Acute sinusitis 03/27/2013  . Allergy    seasonal  . Anxiety   . Arthritis 06/09/2013  . Depression   . Dyspareunia   . Edema extremities   . Encounter for preventative adult health care exam with abnormal findings 04/21/2014  . GERD (gastroesophageal reflux disease)   . Hip pain, left 11/01/2011  . History of proteinuria syndrome   . Hyperlipidemia   . Insomnia 03/11/2013  . Low back pain 05/09/2011  . Lymphadenitis 06/16/2012  . Myalgia and myositis 02/15/2015  . Obese   . Onychomycosis 05/09/2011  . Oral lesion 08/26/2013  . Otitis externa 06/23/2012   left  . Perimenopause 03/11/2013  . RLS (restless legs syndrome)   . RUQ pain 09/20/2011  . Sleep apnea 04/21/2014  . Tinea corporis 04/26/2017  . Unspecified constipation 05/13/2013  . Urinary frequency 08/26/2013    Past Surgical History:  Procedure Laterality Date  . BACK SURGERY  2006   low, discectomy for herniated  disc  . BLADDER SUSPENSION N/A 11/07/2012   Procedure: [facility];  Surgeon: Malka So, MD;  Location: [facility];  Service: Urology;  Laterality: N/A;  . CARPAL TUNNEL RELEASE Right 2006  . CESAREAN SECTION  1993  . CYSTOSCOPY N/A 11/07/2012   Procedure: CYSTOSCOPY;  Surgeon: Malka So, MD;  Location: [facility];  Service: Urology;  Laterality: N/A;  . TUBAL LIGATION  1993    Family History  Problem Relation Age of Onset  . Cancer Mother 43       thyroid, malignant brain tumor  . Arthritis Mother        neck, back  . Hepatitis Sister        Hepatitis C  . Heart disease Sister        stent  . Diabetes Maternal Grandmother   . Arthritis Father        back pain  . Heart disease Father        stent    Social History   Socioeconomic History  . Marital status: Married    Spouse name: Not on file  . Number of children: Not on file  . Years of education: Not on file  . Highest education level: Not on file  Occupational History  . Not on file  Social Needs  . Financial resource strain: Not on file  . Food insecurity:    Worry: Not on file  Inability: Not on file  . Transportation needs:    Medical: Not on file    Non-medical: Not on file  Tobacco Use  . Smoking status: Former Smoker    Packs/day: 0.50    Years: 20.00    Pack years: 10.00    Types: Cigarettes    Last attempt to quit: 07/12/1992    Years since quitting: 25.8  . Smokeless tobacco: Never Used  Substance and Sexual Activity  . Alcohol use: Yes    Comment: occasionaly  . Drug use: No  . Sexual activity: Yes    Partners: Male  Lifestyle  . Physical activity:    Days per week: Not on file    Minutes per session: Not on file  . Stress: Not on file  Relationships  . Social connections:    Talks on phone: Not on file    Gets together: Not on file    Attends religious service: Not on file    Active member of club or organization: Not on file    Attends meetings  of clubs or organizations: Not on file    Relationship status: Not on file  . Intimate partner violence:    Fear of current or ex partner: Not on file    Emotionally abused: Not on file    Physically abused: Not on file    Forced sexual activity: Not on file  Other Topics Concern  . Not on file  Social History Narrative  . Not on file    Outpatient Medications Prior to Visit  Medication Sig Dispense Refill  . albuterol (PROVENTIL HFA;VENTOLIN HFA) 108 (90 BASE) MCG/ACT inhaler Inhale 2 puffs into the lungs every 6 (six) hours as needed for wheezing or shortness of breath. 1 Inhaler 2  . BIOTIN PO Take by mouth.    . Calcium Carbonate-Vitamin D (CALTRATE 600+D) 600-400 MG-UNIT per tablet Take 1 tablet by mouth daily.     . Cholecalciferol (VITAMIN D3) 1000 UNITS CAPS Take by mouth.      . clotrimazole-betamethasone (LOTRISONE) cream Apply 1 application topically 2 (two) times daily. 45 g 01  . diazepam (VALIUM) 10 MG tablet Take 1 tablet (10 mg total) by mouth daily as needed for anxiety. 40 tablet 1  . FLAXSEED, LINSEED, PO Take by mouth every other day.     . fluticasone (FLONASE) 50 MCG/ACT nasal spray PLACE 2 SPRAYS INTO BOTH NOSTRILS DAILY. 16 g 6  . hydrOXYzine (ATARAX/VISTARIL) 50 MG tablet Take 50 mg by mouth every 6 (six) hours as needed.    . lamoTRIgine (LAMICTAL) 150 MG tablet Take 150 mg by mouth daily.    . Nutritional Supplements (ESTROVEN PO) Take by mouth.    Marland Kitchen rOPINIRole (REQUIP) 1 MG tablet Take 1 mg by mouth daily.      . baclofen (LIORESAL) 20 MG tablet TAKE 1 TABLET (20 MG) BY MOUTH AT BEDTIME AS NEEDED FOR MUSCLE SPASMS. 90 tablet 3  . colesevelam (WELCHOL) 625 MG tablet Take 2 tablets by mouth at bedtime 60 tablet 2  . furosemide (LASIX) 40 MG tablet 1 tab po daily and a second tab daily prn increased edema or weight gain > 3#/24 hours. 120 tablet 1  . omeprazole (PRILOSEC) 40 MG capsule Take 1 capsule (40 mg total) by mouth daily. 90 capsule 1  . potassium  chloride SA (K-DUR,KLOR-CON) 20 MEQ tablet TAKE 1 TABLET (20 MEQ TOTAL) BY MOUTH DAILY. TAKE A 2ND TABLET DIALY AS NEEDED 100 tablet 1  .  promethazine (PHENERGAN) 25 MG tablet Take 1 tablet (25 mg total) by mouth every 8 (eight) hours as needed for nausea. 40 tablet 3  . ranitidine (ZANTAC) 300 MG capsule Take 1 capsule (300 mg total) by mouth every evening. 30 capsule 5  . rosuvastatin (CRESTOR) 20 MG tablet TAKE ONE TABLET BY MOUTH DAILY 90 tablet 1   No facility-administered medications prior to visit.     Allergies  Allergen Reactions  . Sulfur Diarrhea and Nausea And Vomiting  . Carbamazepine Hives  . Ceclor [Cefaclor]     Tongue swell  . Morphine And Related Itching  . Atorvastatin Other (See Comments)    Leg pain    Review of Systems  Constitutional: Positive for malaise/fatigue. Negative for fever.  HENT: Negative for congestion.   Eyes: Negative for blurred vision.  Respiratory: Negative for shortness of breath.   Cardiovascular: Negative for chest pain, palpitations and leg swelling.  Gastrointestinal: Negative for abdominal pain, blood in stool and nausea.  Genitourinary: Negative for dysuria and frequency.  Musculoskeletal: Positive for back pain, joint pain and myalgias. Negative for falls.  Skin: Negative for rash.  Neurological: Negative for dizziness, loss of consciousness and headaches.  Endo/Heme/Allergies: Negative for environmental allergies.  Psychiatric/Behavioral: Negative for depression. The patient is not nervous/anxious.        Objective:    Physical Exam  Constitutional: She is oriented to person, place, and time. She appears well-developed and well-nourished. No distress.  HENT:  Head: Normocephalic and atraumatic.  Eyes: Conjunctivae are normal.  Neck: Neck supple. No thyromegaly present.  Cardiovascular: Normal rate, regular rhythm and normal heart sounds.  No murmur heard. Pulmonary/Chest: Effort normal and breath sounds normal. No  respiratory distress.  Abdominal: Soft. Bowel sounds are normal. She exhibits no distension and no mass. There is no tenderness.  Musculoskeletal: She exhibits no edema.  Lymphadenopathy:    She has no cervical adenopathy.  Neurological: She is alert and oriented to person, place, and time.  Skin: Skin is warm and dry.  Psychiatric: She has a normal mood and affect. Her behavior is normal.    BP 102/64 (BP Location: Left Arm, Patient Position: Sitting, Cuff Size: Normal)   Pulse 81   Temp 97.7 F (36.5 C) (Oral)   Resp 18   Ht 5\' 5"  (1.651 m)   Wt 225 lb 9.6 oz (102.3 kg)   LMP 10/03/2012   SpO2 92%   BMI 37.54 kg/m  Wt Readings from Last 3 Encounters:  05/09/18 225 lb 9.6 oz (102.3 kg)  08/18/17 210 lb (95.3 kg)  04/26/17 216 lb 9.6 oz (98.2 kg)     Lab Results  Component Value Date   WBC 6.8 04/26/2017   HGB 14.3 04/26/2017   HCT 43.6 04/26/2017   PLT 229.0 04/26/2017   GLUCOSE 85 04/26/2017   CHOL 175 04/26/2017   TRIG 285.0 (H) 04/26/2017   HDL 49.40 04/26/2017   LDLDIRECT 86.0 04/26/2017   LDLCALC 117 (H) 04/18/2014   ALT 28 04/26/2017   AST 19 04/26/2017   NA 141 04/26/2017   K 5.1 04/26/2017   CL 105 04/26/2017   CREATININE 0.86 04/26/2017   BUN 16 04/26/2017   CO2 26 04/26/2017   TSH 1.42 04/26/2017   HGBA1C 5.5 04/26/2017    Lab Results  Component Value Date   TSH 1.42 04/26/2017   Lab Results  Component Value Date   WBC 6.8 04/26/2017   HGB 14.3 04/26/2017   HCT 43.6 04/26/2017  MCV 88.2 04/26/2017   PLT 229.0 04/26/2017   Lab Results  Component Value Date   NA 141 04/26/2017   K 5.1 04/26/2017   CO2 26 04/26/2017   GLUCOSE 85 04/26/2017   BUN 16 04/26/2017   CREATININE 0.86 04/26/2017   BILITOT 0.6 04/26/2017   ALKPHOS 66 04/26/2017   AST 19 04/26/2017   ALT 28 04/26/2017   PROT 6.9 04/26/2017   ALBUMIN 4.5 04/26/2017   CALCIUM 9.7 04/26/2017   ANIONGAP 13 01/22/2015   GFR 71.71 04/26/2017   Lab Results  Component Value  Date   CHOL 175 04/26/2017   Lab Results  Component Value Date   HDL 49.40 04/26/2017   Lab Results  Component Value Date   LDLCALC 117 (H) 04/18/2014   Lab Results  Component Value Date   TRIG 285.0 (H) 04/26/2017   Lab Results  Component Value Date   CHOLHDL 4 04/26/2017   Lab Results  Component Value Date   HGBA1C 5.5 04/26/2017       Assessment & Plan:   Problem List Items Addressed This Visit    Low back pain    Recent flare and LLE radiculopathy but has improved with heat and rest. No falls or trauma. Encouraged moist heat and gentle stretching as tolerated. May try NSAIDs and prescription meds as directed and report if symptoms worsen or seek immediate care. Consider aspercreme with Lidocaine.       Relevant Medications   baclofen (LIORESAL) 20 MG tablet   Other Relevant Orders   Hemoglobin A1c   Hyperlipidemia    Encouraged heart healthy diet, increase exercise, avoid trans fats, consider a krill oil cap daily. Tolerating Rosuvastatin.      Relevant Medications   furosemide (LASIX) 40 MG tablet   rosuvastatin (CRESTOR) 20 MG tablet   Other Relevant Orders   TSH   Nausea   Relevant Medications   promethazine (PHENERGAN) 25 MG tablet   Other Relevant Orders   CBC   Edema of extremities    Elevate feet above. Minimize sodium and use compression hose      Preventative health care    Patient encouraged to maintain heart healthy diet, regular exercise, adequate sleep. Consider daily probiotics. Take medications as prescribed. Given and reviewed copy of ACP [place] from [place] and encouraged to complete and return      GERD (gastroesophageal reflux disease) - Primary    Ran out of Prilosec 2 weeks ago and her symptoms have flared. Restart Prilosec      Relevant Medications   omeprazole (PRILOSEC) 40 MG capsule   famotidine (PEPCID) 40 MG tablet   Other Relevant Orders   TSH   Ambulatory referral to Gastroenterology   Hyperglycemia      hgba1c acceptable, minimize simple carbs. Increase exercise as tolerated.       Relevant Orders   Hemoglobin A1c   Comprehensive metabolic panel   TSH   Muscle cramp   Relevant Orders   Magnesium    Other Visit Diagnoses    Acute bronchitis, unspecified organism       Relevant Medications   promethazine (PHENERGAN) 25 MG tablet   Hyperlipidemia, mixed       Relevant Medications   furosemide (LASIX) 40 MG tablet   rosuvastatin (CRESTOR) 20 MG tablet   Other Relevant Orders   Lipid panel      I have discontinued Jami S. Spike's colesevelam and ranitidine. I have also changed her rosuvastatin. Additionally,  I am having her start on famotidine. Lastly, I am having her maintain her rOPINIRole, Calcium Carbonate-Vitamin D, (FLAXSEED, LINSEED, PO), Vitamin D3, BIOTIN PO, Nutritional Supplements (ESTROVEN PO), hydrOXYzine, albuterol, fluticasone, lamoTRIgine, diazepam, clotrimazole-betamethasone, baclofen, furosemide, omeprazole, potassium chloride SA, and promethazine.  Meds ordered this encounter  Medications  . baclofen (LIORESAL) 20 MG tablet    Sig: TAKE 1 TABLET (20 MG) BY MOUTH AT BEDTIME AS NEEDED FOR MUSCLE SPASMS.    Dispense:  90 tablet    Refill:  3  . furosemide (LASIX) 40 MG tablet    Sig: 1 tab po daily and a second tab daily prn increased edema or weight gain > 3#/24 hours.    Dispense:  120 tablet    Refill:  1  . omeprazole (PRILOSEC) 40 MG capsule    Sig: Take 1 capsule (40 mg total) by mouth daily.    Dispense:  90 capsule    Refill:  1  . potassium chloride SA (K-DUR,KLOR-CON) 20 MEQ tablet    Sig: TAKE 1 TABLET (20 MEQ TOTAL) BY MOUTH DAILY. TAKE A 2ND TABLET DIALY AS NEEDED    Dispense:  100 tablet    Refill:  1  . promethazine (PHENERGAN) 25 MG tablet    Sig: Take 1 tablet (25 mg total) by mouth every 8 (eight) hours as needed for nausea.    Dispense:  40 tablet    Refill:  3  . rosuvastatin (CRESTOR) 20 MG tablet    Sig: Take 1 tablet (20 mg  total) by mouth daily.    Dispense:  90 tablet    Refill:  1  . famotidine (PEPCID) 40 MG tablet    Sig: Take 1 tablet (40 mg total) by mouth at bedtime.    Dispense:  90 tablet    Refill:  1     Penni Homans, MD

## 2018-05-09 NOTE — Assessment & Plan Note (Signed)
Encouraged heart healthy diet, increase exercise, avoid trans fats, consider a krill oil cap daily. Tolerating Rosuvastatin 

## 2018-05-09 NOTE — Assessment & Plan Note (Signed)
hgba1c acceptable, minimize simple carbs. Increase exercise as tolerated.  

## 2018-05-09 NOTE — Patient Instructions (Signed)
Preventive Care 40-64 Years, Female Preventive care refers to lifestyle choices and visits with your health care provider that can promote health and wellness. What does preventive care include?  A yearly physical exam. This is also called an annual well check.  Dental exams once or twice a year.  Routine eye exams. Ask your health care provider how often you should have your eyes checked.  Personal lifestyle choices, including: ? Daily care of your teeth and gums. ? Regular physical activity. ? Eating a healthy diet. ? Avoiding tobacco and drug use. ? Limiting alcohol use. ? Practicing safe sex. ? Taking low-dose aspirin daily starting at age 58. ? Taking vitamin and mineral supplements as recommended by your health care provider. What happens during an annual well check? The services and screenings done by your health care provider during your annual well check will depend on your age, overall health, lifestyle risk factors, and family history of disease. Counseling Your health care provider may ask you questions about your:  Alcohol use.  Tobacco use.  Drug use.  Emotional well-being.  Home and relationship well-being.  Sexual activity.  Eating habits.  Work and work Statistician.  Method of birth control.  Menstrual cycle.  Pregnancy history.  Screening You may have the following tests or measurements:  Height, weight, and BMI.  Blood pressure.  Lipid and cholesterol levels. These may be checked every 5 years, or more frequently if you are over 81 years old.  Skin check.  Lung cancer screening. You may have this screening every year starting at age 78 if you have a 30-pack-year history of smoking and currently smoke or have quit within the past 15 years.  Fecal occult blood test (FOBT) of the stool. You may have this test every year starting at age 65.  Flexible sigmoidoscopy or colonoscopy. You may have a sigmoidoscopy every 5 years or a colonoscopy  every 10 years starting at age 30.  Hepatitis C blood test.  Hepatitis B blood test.  Sexually transmitted disease (STD) testing.  Diabetes screening. This is done by checking your blood sugar (glucose) after you have not eaten for a while (fasting). You may have this done every 1-3 years.  Mammogram. This may be done every 1-2 years. Talk to your health care provider about when you should start having regular mammograms. This may depend on whether you have a family history of breast cancer.  BRCA-related cancer screening. This may be done if you have a family history of breast, ovarian, tubal, or peritoneal cancers.  Pelvic exam and Pap test. This may be done every 3 years starting at age 80. Starting at age 36, this may be done every 5 years if you have a Pap test in combination with an HPV test.  Bone density scan. This is done to screen for osteoporosis. You may have this scan if you are at high risk for osteoporosis.  Discuss your test results, treatment options, and if necessary, the need for more tests with your health care provider. Vaccines Your health care provider may recommend certain vaccines, such as:  Influenza vaccine. This is recommended every year.  Tetanus, diphtheria, and acellular pertussis (Tdap, Td) vaccine. You may need a Td booster every 10 years.  Varicella vaccine. You may need this if you have not been vaccinated.  Zoster vaccine. You may need this after age 5.  Measles, mumps, and rubella (MMR) vaccine. You may need at least one dose of MMR if you were born in  1955 or later. You may also need a second dose.  Pneumococcal 13-valent conjugate (PCV13) vaccine. You may need this if you have certain conditions and were not previously vaccinated.  Pneumococcal polysaccharide (PPSV23) vaccine. You may need one or two doses if you smoke cigarettes or if you have certain conditions.  Meningococcal vaccine. You may need this if you have certain  conditions.  Hepatitis A vaccine. You may need this if you have certain conditions or if you travel or work in places where you may be exposed to hepatitis A.  Hepatitis B vaccine. You may need this if you have certain conditions or if you travel or work in places where you may be exposed to hepatitis B.  Haemophilus influenzae type b (Hib) vaccine. You may need this if you have certain conditions.  Talk to your health care provider about which screenings and vaccines you need and how often you need them. This information is not intended to replace advice given to you by your health care provider. Make sure you discuss any questions you have with your health care provider. Document Released: 07/25/2015 Document Revised: 03/17/2016 Document Reviewed: 04/29/2015 Elsevier Interactive Patient Education  2018 Elsevier Inc.  

## 2018-05-09 NOTE — Assessment & Plan Note (Signed)
Patient encouraged to maintain heart healthy diet, regular exercise, adequate sleep. Consider daily probiotics. Take medications as prescribed. Given and reviewed copy of ACP documents from [place] and encouraged to complete and return 

## 2018-05-10 LAB — LIPID PANEL
Cholesterol: 167 mg/dL (ref 0–200)
HDL: 50.4 mg/dL (ref >39.00)
NonHDL: 116.64
Total CHOL/HDL Ratio: 3
Triglycerides: 329 mg/dL — ABNORMAL HIGH (ref 0.0–149.0)
VLDL: 65.8 mg/dL — AB (ref 0.0–40.0)

## 2018-05-10 LAB — CBC
HEMATOCRIT: 42 % (ref 36.0–46.0)
HEMOGLOBIN: 14.1 g/dL (ref 12.0–15.0)
MCHC: 33.5 g/dL (ref 30.0–36.0)
MCV: 88.1 fl (ref 78.0–100.0)
Platelets: 228 10*3/uL (ref 150.0–400.0)
RBC: 4.77 Mil/uL (ref 3.87–5.11)
RDW: 13 % (ref 11.5–15.5)
WBC: 7.2 10*3/uL (ref 4.0–10.5)

## 2018-05-10 LAB — COMPREHENSIVE METABOLIC PANEL
ALK PHOS: 61 U/L (ref 39–117)
ALT: 52 U/L — ABNORMAL HIGH (ref 0–35)
AST: 33 U/L (ref 0–37)
Albumin: 4.7 g/dL (ref 3.5–5.2)
BUN: 14 mg/dL (ref 6–23)
CO2: 28 mEq/L (ref 19–32)
Calcium: 9.4 mg/dL (ref 8.4–10.5)
Chloride: 106 mEq/L (ref 96–112)
Creatinine, Ser: 1.01 mg/dL (ref 0.40–1.20)
GFR: 59.35 mL/min — AB (ref >60.00)
Glucose, Bld: 88 mg/dL (ref 70–99)
POTASSIUM: 4.3 meq/L (ref 3.5–5.1)
Sodium: 143 mEq/L (ref 135–145)
TOTAL PROTEIN: 6.9 g/dL (ref 6.0–8.3)
Total Bilirubin: 0.6 mg/dL (ref 0.2–1.2)

## 2018-05-10 LAB — HEMOGLOBIN A1C: Hgb A1c MFr Bld: 5.5 % (ref 4.6–6.5)

## 2018-05-10 LAB — LDL CHOLESTEROL, DIRECT: Direct LDL: 85 mg/dL

## 2018-05-10 LAB — MAGNESIUM: Magnesium: 2.4 mg/dL (ref 1.5–2.5)

## 2018-05-10 LAB — TSH: TSH: 1.43 u[IU]/mL (ref 0.35–4.50)

## 2018-05-12 ENCOUNTER — Telehealth: Payer: Self-pay | Admitting: Family Medicine

## 2018-05-12 DIAGNOSIS — E782 Mixed hyperlipidemia: Secondary | ICD-10-CM

## 2018-05-12 MED ORDER — ROSUVASTATIN CALCIUM 20 MG PO TABS: 40.0000 mg | ORAL_TABLET | Freq: Every day | ORAL | 1 refills | Status: DC

## 2018-05-12 NOTE — Telephone Encounter (Signed)
Reviewed lab results and physician's note with patient.She continues to use [employer].

## 2018-05-12 NOTE — Telephone Encounter (Signed)
Patient calling back for lab results. Nurse Triage currently unavailable. Please advise.  Copied from [place] [id number]. Topic: Quick Communication - Lab Results (Clinic Use ONLY) >> May 12, 2018  9:59 AM Loleta Chance, CMA wrote: Called patient to inform them of 05/09/2018 lab results. When patient returns call, triage nurse may disclose results.

## 2018-05-15 ENCOUNTER — Telehealth: Payer: Self-pay

## 2018-05-15 NOTE — Telephone Encounter (Signed)
PA initiated via Covermymeds; KEY: [id number]. Awaiting determination.

## 2018-05-15 NOTE — Telephone Encounter (Signed)
PA approved. Effective from 05/15/2018 through 05/16/2019.

## 2018-05-16 NOTE — Telephone Encounter (Signed)
[employer] called to inform that rosuvastatin (CRESTOR) 20 MG tablet has been approved quantity limit exception for 1 year

## 2018-05-17 ENCOUNTER — Ambulatory Visit (HOSPITAL_BASED_OUTPATIENT_CLINIC_OR_DEPARTMENT_OTHER)
Admission: RE | Admit: 2018-05-17 | Discharge: 2018-05-17 | Disposition: A | Discharge status: Home / Self Care | Payer: Medicare Other | Source: Ambulatory Visit | Attending: Family Medicine | Admitting: Family Medicine

## 2018-05-17 DIAGNOSIS — Z1231 Encounter for screening mammogram for malignant neoplasm of breast: Secondary | ICD-10-CM | POA: Diagnosis not present

## 2018-05-30 ENCOUNTER — Ambulatory Visit (INDEPENDENT_AMBULATORY_CARE_PROVIDER_SITE_OTHER): Payer: Medicare Other | Admitting: Family Medicine

## 2018-05-30 ENCOUNTER — Encounter: Payer: Self-pay | Admitting: Family Medicine

## 2018-05-30 VITALS — BP 114/73 | HR 73 | Temp 98.4°F | Resp 16 | Ht 65.0 in | Wt 221.5 lb

## 2018-05-30 DIAGNOSIS — J069 Acute upper respiratory infection, unspecified: Secondary | ICD-10-CM

## 2018-05-30 DIAGNOSIS — R110 Nausea: Secondary | ICD-10-CM

## 2018-05-30 DIAGNOSIS — J209 Acute bronchitis, unspecified: Secondary | ICD-10-CM | POA: Diagnosis not present

## 2018-05-30 MED ORDER — ALBUTEROL SULFATE HFA 108 (90 BASE) MCG/ACT IN AERS: INHALATION_SPRAY | RESPIRATORY_TRACT | 0 refills | Status: DC

## 2018-05-30 MED ORDER — AZITHROMYCIN 250 MG PO TABS: ORAL_TABLET | ORAL | 0 refills | Status: DC

## 2018-05-30 MED ORDER — PREDNISONE 20 MG PO TABS: ORAL_TABLET | ORAL | 0 refills | Status: DC

## 2018-05-30 NOTE — Progress Notes (Signed)
OFFICE VISIT  05/30/2018   CC:  Chief Complaint  Patient presents with  . URI   HPI:    Patient is a 62 y.o.  female who presents for cough. Onset 2 wks ago, dry cough, runny nose, cough increased and became productive of yellow mucous. Mild chest tightness feeling, cough keeping her from sleeping at night.  Mild ST and HA. Nyquil, mucinex--multiple types tried.  No fever.  Wheeze from upper airway but not chest.   Eating ok, drinking hot teas and lots of water.  No n/v/d. Fatigue but no body aches.  No SOB. Tobacco hx: former cigs, but quit 25 yrs ago.   Past Medical History:  Diagnosis Date  . Acute sinusitis 03/27/2013  . Allergy    seasonal  . Anxiety   . Arthritis 06/09/2013  . Depression   . Dyspareunia   . Edema extremities   . Encounter for preventative adult health care exam with abnormal findings 04/21/2014  . GERD (gastroesophageal reflux disease)   . Hip pain, left 11/01/2011  . History of proteinuria syndrome   . Hyperlipidemia   . Insomnia 03/11/2013  . Low back pain 05/09/2011  . Lymphadenitis 06/16/2012  . Myalgia and myositis 02/15/2015  . Obese   . Onychomycosis 05/09/2011  . Oral lesion 08/26/2013  . Otitis externa 06/23/2012   left  . Perimenopause 03/11/2013  . RLS (restless legs syndrome)   . RUQ pain 09/20/2011  . Sleep apnea 04/21/2014  . Tinea corporis 04/26/2017  . Unspecified constipation 05/13/2013  . Urinary frequency 08/26/2013    Past Surgical History:  Procedure Laterality Date  . BACK SURGERY  2006   low, discectomy for herniated disc  . BLADDER SUSPENSION N/A 11/07/2012   Procedure: [facility];  Surgeon: Malka So, MD;  Location: [facility];  Service: Urology;  Laterality: N/A;  . CARPAL TUNNEL RELEASE Right 2006  . CESAREAN SECTION  1993  . CYSTOSCOPY N/A 11/07/2012   Procedure: CYSTOSCOPY;  Surgeon: Malka So, MD;  Location: [facility];  Service: Urology;  Laterality: N/A;  . TUBAL LIGATION   1993    Outpatient Medications Prior to Visit  Medication Sig Dispense Refill  . baclofen (LIORESAL) 20 MG tablet TAKE 1 TABLET (20 MG) BY MOUTH AT BEDTIME AS NEEDED FOR MUSCLE SPASMS. 90 tablet 3  . Calcium Carbonate-Vitamin D (CALTRATE 600+D) 600-400 MG-UNIT per tablet Take 1 tablet by mouth daily.     . Cholecalciferol (VITAMIN D3) 1000 UNITS CAPS Take by mouth.      . clotrimazole-betamethasone (LOTRISONE) cream Apply 1 application topically 2 (two) times daily. 45 g 01  . diazepam (VALIUM) 10 MG tablet Take 1 tablet (10 mg total) by mouth daily as needed for anxiety. 40 tablet 1  . famotidine (PEPCID) 40 MG tablet Take 1 tablet (40 mg total) by mouth at bedtime. 90 tablet 1  . furosemide (LASIX) 40 MG tablet 1 tab po daily and a second tab daily prn increased edema or weight gain > 3#/24 hours. 120 tablet 1  . hydrOXYzine (ATARAX/VISTARIL) 50 MG tablet Take 50 mg by mouth every 6 (six) hours as needed.    . lamoTRIgine (LAMICTAL) 150 MG tablet Take 150 mg by mouth daily.    Marland Kitchen omeprazole (PRILOSEC) 40 MG capsule Take 1 capsule (40 mg total) by mouth daily. 90 capsule 1  . potassium chloride SA (K-DUR,KLOR-CON) 20 MEQ tablet TAKE 1 TABLET (20 MEQ TOTAL) BY MOUTH DAILY. TAKE A 2ND  TABLET DIALY AS NEEDED 100 tablet 1  . promethazine (PHENERGAN) 25 MG tablet Take 1 tablet (25 mg total) by mouth every 8 (eight) hours as needed for nausea. 40 tablet 3  . rOPINIRole (REQUIP) 1 MG tablet Take 1 mg by mouth daily.      . rosuvastatin (CRESTOR) 20 MG tablet Take 2 tablets (40 mg total) by mouth daily. 90 tablet 1  . BIOTIN PO Take by mouth.    Marland Kitchen FLAXSEED, LINSEED, PO Take by mouth every other day.     . fluticasone (FLONASE) 50 MCG/ACT nasal spray PLACE 2 SPRAYS INTO BOTH NOSTRILS DAILY. (Patient not taking: Reported on 05/30/2018) 16 g 6  . Nutritional Supplements (ESTROVEN PO) Take by mouth.    Marland Kitchen albuterol (PROVENTIL HFA;VENTOLIN HFA) 108 (90 BASE) MCG/ACT inhaler Inhale 2 puffs into the lungs  every 6 (six) hours as needed for wheezing or shortness of breath. (Patient not taking: Reported on 05/30/2018) 1 Inhaler 2   No facility-administered medications prior to visit.     Allergies  Allergen Reactions  . Sulfur Diarrhea and Nausea And Vomiting  . Carbamazepine Hives  . Ceclor [Cefaclor]     Tongue swell  . Morphine And Related Itching  . Atorvastatin Other (See Comments)    Leg pain   ROS As per HPI  PE: Blood pressure 114/73, pulse 73, temperature 98.4 F (36.9 C), temperature source Oral, resp. rate 16, height 5\' 5"  (1.651 m), weight 221 lb 8 oz (100.5 kg), last menstrual period 10/03/2012, SpO2 97 %. VS: noted--normal. Gen: alert, NAD, NONTOXIC APPEARING. HEENT: eyes without injection, drainage, or swelling.  Ears: EACs clear, TMs with normal light reflex and landmarks.  Nose: Clear rhinorrhea, with some dried, crusty exudate adherent to mildly injected mucosa.  No purulent d/c.  No paranasal sinus TTP.  No facial swelling.  Throat and mouth without focal lesion.  No pharyngial swelling, erythema, or exudate.   Neck: supple, no LAD.   LUNGS: CTA bilat, nonlabored resps.  She has some increased tendency to cough after a full exhalation.  Aeration is good. CV: RRR, no m/r/g. EXT: no c/c/e SKIN: no rash    LABS:    Chemistry      Component Value Date/Time   NA 143 05/09/2018 1519   K 4.3 05/09/2018 1519   CL 106 05/09/2018 1519   CO2 28 05/09/2018 1519   BUN 14 05/09/2018 1519   CREATININE 1.01 05/09/2018 1519   CREATININE 0.79 01/15/2014 1023      Component Value Date/Time   CALCIUM 9.4 05/09/2018 1519   ALKPHOS 61 05/09/2018 1519   AST 33 05/09/2018 1519   ALT 52 (H) 05/09/2018 1519   BILITOT 0.6 05/09/2018 1519     Lab Results  Component Value Date   WBC 7.2 05/09/2018   HGB 14.1 05/09/2018   HCT 42.0 05/09/2018   MCV 88.1 05/09/2018   PLT 228.0 05/09/2018   Lab Results  Component Value Date   HGBA1C 5.5 05/09/2018    IMPRESSION AND  PLAN:  1) Prolonged URI with acute bronchitis, sx's not improving at 2 wks of illness. Plan: azith x 5d, pred taper x 10d, albuterol HFA. Inhaler education done by nurse today. Get otc generic robitussin DM OR Mucinex DM and use as directed on the packaging for cough and congestion. Use otc generic saline nasal spray 2-3 times per day to irrigate/moisturize your nasal passages.  An After Visit Summary was printed and given to the patient.  FOLLOW  UP: Return if symptoms worsen or fail to improve.  Signed:  Crissie Sickles, MD           05/30/2018

## 2018-05-30 NOTE — Patient Instructions (Signed)
Get otc generic robitussin DM OR Mucinex DM and use as directed on the packaging for cough and congestion. Use otc generic saline nasal spray 2-3 times per day to irrigate/moisturize your nasal passages.   

## 2018-06-01 MED FILL — ROSUVASTATIN CALCIUM 20 MG: 20 | 45 days supply | Qty: 90 | Fill #0

## 2018-07-14 MED FILL — PROMETHAZINE 25 MG TABLET: 25 | 13 days supply | Qty: 40 | Fill #1

## 2018-07-14 MED FILL — BACLOFEN 20 MG TABS: 20 | 90 days supply | Qty: 90 | Fill #0

## 2018-07-25 MED FILL — ROSUVASTATIN CALCIUM 20 MG: 20 | 45 days supply | Qty: 90 | Fill #1

## 2018-08-01 ENCOUNTER — Encounter: Payer: Self-pay | Admitting: Family Medicine

## 2018-08-02 DIAGNOSIS — G2581 Restless legs syndrome: Secondary | ICD-10-CM | POA: Diagnosis not present

## 2018-08-02 DIAGNOSIS — F3181 Bipolar II disorder: Secondary | ICD-10-CM | POA: Diagnosis not present

## 2018-08-14 MED FILL — OMEPRAZOLE 40 MG CPDR: 40 | 90 days supply | Qty: 90 | Fill #1

## 2018-08-14 MED FILL — FAMOTIDINE 40 MG TABS: 40 | 90 days supply | Qty: 90 | Fill #1

## 2018-08-17 DIAGNOSIS — D224 Melanocytic nevi of scalp and neck: Secondary | ICD-10-CM | POA: Diagnosis not present

## 2018-08-17 DIAGNOSIS — L738 Other specified follicular disorders: Secondary | ICD-10-CM | POA: Diagnosis not present

## 2018-08-17 DIAGNOSIS — L218 Other seborrheic dermatitis: Secondary | ICD-10-CM | POA: Diagnosis not present

## 2018-09-11 MED FILL — ROSUVASTATIN CALCIUM 20 MG: 20 | 90 days supply | Qty: 90 | Fill #1

## 2018-09-11 MED FILL — POTASSIUM CHLORIDE CRYS ER: 20 | 50 days supply | Qty: 100 | Fill #1

## 2018-09-26 ENCOUNTER — Other Ambulatory Visit: Payer: Self-pay | Admitting: Family Medicine

## 2018-09-26 DIAGNOSIS — E782 Mixed hyperlipidemia: Secondary | ICD-10-CM

## 2018-10-12 MED FILL — BACLOFEN 20 MG TABS: 20 | 90 days supply | Qty: 90 | Fill #1

## 2018-10-31 MED FILL — ROSUVASTATIN CALCIUM 20 MG: 20 | 45 days supply | Qty: 90 | Fill #0

## 2018-11-13 ENCOUNTER — Other Ambulatory Visit: Payer: Self-pay | Admitting: Family Medicine

## 2018-11-13 MED FILL — OMEPRAZOLE 40 MG CPDR: 40 | 90 days supply | Qty: 90 | Fill #0

## 2018-11-13 MED FILL — PROMETHAZINE 25 MG TABLET: 25 | 13 days supply | Qty: 40 | Fill #2

## 2018-11-14 ENCOUNTER — Ambulatory Visit: Payer: Medicare Other | Admitting: Family Medicine

## 2018-11-16 ENCOUNTER — Ambulatory Visit (INDEPENDENT_AMBULATORY_CARE_PROVIDER_SITE_OTHER): Payer: Medicare Other | Admitting: Family Medicine

## 2018-11-16 DIAGNOSIS — E782 Mixed hyperlipidemia: Secondary | ICD-10-CM | POA: Diagnosis not present

## 2018-11-16 DIAGNOSIS — K219 Gastro-esophageal reflux disease without esophagitis: Secondary | ICD-10-CM | POA: Diagnosis not present

## 2018-11-16 DIAGNOSIS — R739 Hyperglycemia, unspecified: Secondary | ICD-10-CM

## 2018-11-16 DIAGNOSIS — R110 Nausea: Secondary | ICD-10-CM

## 2018-11-16 DIAGNOSIS — T7840XS Allergy, unspecified, sequela: Secondary | ICD-10-CM | POA: Diagnosis not present

## 2018-11-16 DIAGNOSIS — J209 Acute bronchitis, unspecified: Secondary | ICD-10-CM

## 2018-11-16 MED ORDER — POTASSIUM CHLORIDE CRYS ER 20 MEQ PO TBCR: EXTENDED_RELEASE_TABLET | ORAL | 1 refills | Status: DC

## 2018-11-16 MED ORDER — ROSUVASTATIN CALCIUM 40 MG PO TABS: 40.0000 mg | ORAL_TABLET | Freq: Every day | ORAL | 1 refills | Status: DC

## 2018-11-16 MED ORDER — PROMETHAZINE HCL 25 MG PO TABS: 25.0000 mg | ORAL_TABLET | Freq: Three times a day (TID) | ORAL | 3 refills | Status: DC | PRN

## 2018-11-16 MED ORDER — OMEPRAZOLE 40 MG PO CPDR: 40.0000 mg | DELAYED_RELEASE_CAPSULE | Freq: Every day | ORAL | 1 refills | Status: DC

## 2018-11-19 NOTE — Progress Notes (Signed)
Virtual Visit via Telephone Note  I connected with [name] on 11/19/18 at  2:40 PM EDT by a telephone enabled telemedicine application and verified that I am speaking with the correct person using two identifiers. [name], CMA tried to get patient set up on video platform but patient was unable to complete and telephone encourager was completed.   Location: Patient: home Provider: office   I discussed the limitations of evaluation and management by telemedicine and the availability of in person appointments. The patient expressed understanding and agreed to proceed.     Subjective:    Patient ID: [name], female    DOB: 1956/03/22, 63 y.o.   MRN: [id number]  No chief complaint on file.   HPI Patient is in today for follow up on chronic medical concerns including hyperlipidemia, allergic symptoms, reflux and more. She notes some reflux with nausea at times. No polyuria or polydipsia.Denies CP/palp/SOB/HA/congestion/fevers/GI or GU c/o. Taking meds as prescribed. No recent hospitalizations.   Past Medical History:  Diagnosis Date  . Acute sinusitis 03/27/2013  . Allergy    seasonal  . Anxiety   . Arthritis 06/09/2013  . Depression   . Dyspareunia   . Edema extremities   . Encounter for preventative adult health care exam with abnormal findings 04/21/2014  . GERD (gastroesophageal reflux disease)   . Hip pain, left 11/01/2011  . History of proteinuria syndrome   . Hyperlipidemia   . Insomnia 03/11/2013  . Low back pain 05/09/2011  . Lymphadenitis 06/16/2012  . Myalgia and myositis 02/15/2015  . Obese   . Onychomycosis 05/09/2011  . Oral lesion 08/26/2013  . Otitis externa 06/23/2012   left  . Perimenopause 03/11/2013  . RLS (restless legs syndrome)   . RUQ pain 09/20/2011  . Sleep apnea 04/21/2014  . Tinea corporis 04/26/2017  . Unspecified constipation 05/13/2013  . Urinary frequency 08/26/2013    Past Surgical History:  Procedure Laterality Date  .  BACK SURGERY  2006   low, discectomy for herniated disc  . BLADDER SUSPENSION N/A 11/07/2012   Procedure: [facility];  Surgeon: Malka So, MD;  Location: [facility];  Service: Urology;  Laterality: N/A;  . CARPAL TUNNEL RELEASE Right 2006  . CESAREAN SECTION  1993  . CYSTOSCOPY N/A 11/07/2012   Procedure: CYSTOSCOPY;  Surgeon: Malka So, MD;  Location: [facility];  Service: Urology;  Laterality: N/A;  . TUBAL LIGATION  1993    Family History  Problem Relation Age of Onset  . Cancer Mother 75       thyroid, malignant brain tumor  . Arthritis Mother        neck, back  . Hepatitis Sister        Hepatitis C  . Heart disease Sister        stent  . Diabetes Maternal Grandmother   . Arthritis Father        back pain  . Heart disease Father        stent    Social History   Socioeconomic History  . Marital status: Married    Spouse name: Not on file  . Number of children: Not on file  . Years of education: Not on file  . Highest education level: Not on file  Occupational History  . Not on file  Social Needs  . Financial resource strain: Not on file  . Food insecurity:    Worry: Not on file  Inability: Not on file  . Transportation needs:    Medical: Not on file    Non-medical: Not on file  Tobacco Use  . Smoking status: Former Smoker    Packs/day: 0.50    Years: 20.00    Pack years: 10.00    Types: Cigarettes    Last attempt to quit: 07/12/1992    Years since quitting: 26.3  . Smokeless tobacco: Never Used  Substance and Sexual Activity  . Alcohol use: Yes    Comment: occasionaly  . Drug use: No  . Sexual activity: Yes    Partners: Male  Lifestyle  . Physical activity:    Days per week: Not on file    Minutes per session: Not on file  . Stress: Not on file  Relationships  . Social connections:    Talks on phone: Not on file    Gets together: Not on file    Attends religious service: Not on file    Active member of club  or organization: Not on file    Attends meetings of clubs or organizations: Not on file    Relationship status: Not on file  . Intimate partner violence:    Fear of current or ex partner: Not on file    Emotionally abused: Not on file    Physically abused: Not on file    Forced sexual activity: Not on file  Other Topics Concern  . Not on file  Social History Narrative  . Not on file    Outpatient Medications Prior to Visit  Medication Sig Dispense Refill  . albuterol (PROVENTIL HFA;VENTOLIN HFA) 108 (90 Base) MCG/ACT inhaler 1-2 puffs q4h prn cough, wheeze, or chest tightness 1 Inhaler 0  . baclofen (LIORESAL) 20 MG tablet TAKE 1 TABLET (20 MG) BY MOUTH AT BEDTIME AS NEEDED FOR MUSCLE SPASMS. 90 tablet 3  . BIOTIN PO Take by mouth.    . Calcium Carbonate-Vitamin D (CALTRATE 600+D) 600-400 MG-UNIT per tablet Take 1 tablet by mouth daily.     . Cholecalciferol (VITAMIN D3) 1000 UNITS CAPS Take by mouth.      . clotrimazole-betamethasone (LOTRISONE) cream Apply 1 application topically 2 (two) times daily. 45 g 01  . diazepam (VALIUM) 10 MG tablet Take 1 tablet (10 mg total) by mouth daily as needed for anxiety. 40 tablet 1  . famotidine (PEPCID) 40 MG tablet TAKE 1 TABLET (40 MG TOTAL) BY MOUTH AT BEDTIME. 90 tablet 1  . FLAXSEED, LINSEED, PO Take by mouth every other day.     . fluticasone (FLONASE) 50 MCG/ACT nasal spray PLACE 2 SPRAYS INTO BOTH NOSTRILS DAILY. (Patient not taking: Reported on 05/30/2018) 16 g 6  . furosemide (LASIX) 40 MG tablet 1 tab po daily and a second tab daily prn increased edema or weight gain > 3#/24 hours. 120 tablet 1  . hydrOXYzine (ATARAX/VISTARIL) 50 MG tablet Take 50 mg by mouth every 6 (six) hours as needed.    . lamoTRIgine (LAMICTAL) 150 MG tablet Take 150 mg by mouth daily.    . Nutritional Supplements (ESTROVEN PO) Take by mouth.    Marland Kitchen rOPINIRole (REQUIP) 1 MG tablet Take 1 mg by mouth daily.      Marland Kitchen azithromycin (ZITHROMAX) 250 MG tablet 2 tabs po qd  x 1d, then 1 tab po qd x 4d 6 tablet 0  . omeprazole (PRILOSEC) 40 MG capsule TAKE 1 CAPSULE (40 MG TOTAL) BY MOUTH DAILY. 90 capsule 1  . potassium chloride SA (K-DUR,KLOR-CON) 20  MEQ tablet TAKE 1 TABLET (20 MEQ TOTAL) BY MOUTH DAILY. TAKE A 2ND TABLET DIALY AS NEEDED 100 tablet 1  . predniSONE (DELTASONE) 20 MG tablet 2 tabs po qd x 5d, then 1 tab po qd x 5d 15 tablet 0  . promethazine (PHENERGAN) 25 MG tablet Take 1 tablet (25 mg total) by mouth every 8 (eight) hours as needed for nausea. 40 tablet 3  . rosuvastatin (CRESTOR) 20 MG tablet TAKE 2 TABLETS (40 MG TOTAL) BY MOUTH DAILY. 90 tablet 1   No facility-administered medications prior to visit.     Allergies  Allergen Reactions  . Sulfur Diarrhea and Nausea And Vomiting  . Carbamazepine Hives  . Ceclor [Cefaclor]     Tongue swell  . Morphine And Related Itching  . Atorvastatin Other (See Comments)    Leg pain    Review of Systems  Constitutional: Positive for malaise/fatigue. Negative for fever.  HENT: Positive for congestion.   Eyes: Negative for blurred vision.  Respiratory: Negative for shortness of breath.   Cardiovascular: Negative for chest pain, palpitations and leg swelling.  Gastrointestinal: Negative for abdominal pain, blood in stool and nausea.  Genitourinary: Negative for dysuria and frequency.  Musculoskeletal: Negative for falls.  Skin: Negative for rash.  Neurological: Negative for dizziness, loss of consciousness and headaches.  Endo/Heme/Allergies: Negative for environmental allergies.  Psychiatric/Behavioral: Positive for depression. The patient is not nervous/anxious.        Objective:    Physical Exam unable to complete due to telephone encounter.   BP 117/64 (BP Location: Left Arm, Patient Position: Sitting, Cuff Size: Normal)   Pulse 69   Wt 227 lb 6.4 oz (103.1 kg)   LMP 10/03/2012   BMI 37.84 kg/m  Wt Readings from Last 3 Encounters:  11/16/18 227 lb 6.4 oz (103.1 kg)  05/30/18 221 lb  8 oz (100.5 kg)  05/09/18 225 lb 9.6 oz (102.3 kg)    Diabetic Foot Exam - Simple   No data filed     Lab Results  Component Value Date   WBC 7.2 05/09/2018   HGB 14.1 05/09/2018   HCT 42.0 05/09/2018   PLT 228.0 05/09/2018   GLUCOSE 88 05/09/2018   CHOL 167 05/09/2018   TRIG 329.0 (H) 05/09/2018   HDL 50.40 05/09/2018   LDLDIRECT 85.0 05/09/2018   LDLCALC 117 (H) 04/18/2014   ALT 52 (H) 05/09/2018   AST 33 05/09/2018   NA 143 05/09/2018   K 4.3 05/09/2018   CL 106 05/09/2018   CREATININE 1.01 05/09/2018   BUN 14 05/09/2018   CO2 28 05/09/2018   TSH 1.43 05/09/2018   HGBA1C 5.5 05/09/2018    Lab Results  Component Value Date   TSH 1.43 05/09/2018   Lab Results  Component Value Date   WBC 7.2 05/09/2018   HGB 14.1 05/09/2018   HCT 42.0 05/09/2018   MCV 88.1 05/09/2018   PLT 228.0 05/09/2018   Lab Results  Component Value Date   NA 143 05/09/2018   K 4.3 05/09/2018   CO2 28 05/09/2018   GLUCOSE 88 05/09/2018   BUN 14 05/09/2018   CREATININE 1.01 05/09/2018   BILITOT 0.6 05/09/2018   ALKPHOS 61 05/09/2018   AST 33 05/09/2018   ALT 52 (H) 05/09/2018   PROT 6.9 05/09/2018   ALBUMIN 4.7 05/09/2018   CALCIUM 9.4 05/09/2018   ANIONGAP 13 01/22/2015   GFR 59.35 (L) 05/09/2018   Lab Results  Component Value Date   CHOL 167  05/09/2018   Lab Results  Component Value Date   HDL 50.40 05/09/2018   Lab Results  Component Value Date   LDLCALC 117 (H) 04/18/2014   Lab Results  Component Value Date   TRIG 329.0 (H) 05/09/2018   Lab Results  Component Value Date   CHOLHDL 3 05/09/2018   Lab Results  Component Value Date   HGBA1C 5.5 05/09/2018       Assessment & Plan:   Problem List Items Addressed This Visit    Hyperlipidemia    Tolerating statin, encouraged heart healthy diet, avoid trans fats, minimize simple carbs and saturated fats. Increase exercise as tolerated      Relevant Medications   rosuvastatin (CRESTOR) 40 MG tablet    Allergy    Flonase prn      Nausea    Avoid offending foods, start probiotics. Do not eat large meals in late evening and consider raising head of bed. Refill phenergan      Relevant Medications   promethazine (PHENERGAN) 25 MG tablet   GERD (gastroesophageal reflux disease)    Refilled Omeprazole      Relevant Medications   omeprazole (PRILOSEC) 40 MG capsule   Hyperglycemia    hgba1c acceptable, minimize simple carbs. Increase exercise as tolerated.        Other Visit Diagnoses    Acute bronchitis, unspecified organism       Relevant Medications   promethazine (PHENERGAN) 25 MG tablet   Hyperlipidemia, mixed       Relevant Medications   rosuvastatin (CRESTOR) 40 MG tablet      I have discontinued [name]'s azithromycin, predniSONE, and rosuvastatin. I have also changed her potassium chloride SA. Additionally, I am having her start on rosuvastatin. Lastly, I am having her maintain her rOPINIRole, Calcium Carbonate-Vitamin D, (FLAXSEED, LINSEED, PO), Vitamin D3, BIOTIN PO, Nutritional Supplements (ESTROVEN PO), hydrOXYzine, fluticasone, lamoTRIgine, diazepam, clotrimazole-betamethasone, baclofen, furosemide, albuterol, famotidine, omeprazole, and promethazine.  Meds ordered this encounter  Medications  . potassium chloride SA (K-DUR) 20 MEQ tablet    Sig: TAKE 1 TABLET (20 MEQ TOTAL) BY MOUTH DAILY. TAKE A 2ND TABLET DIALY AS NEEDED    Dispense:  100 tablet    Refill:  1  . omeprazole (PRILOSEC) 40 MG capsule    Sig: Take 1 capsule (40 mg total) by mouth daily.    Dispense:  90 capsule    Refill:  1  . promethazine (PHENERGAN) 25 MG tablet    Sig: Take 1 tablet (25 mg total) by mouth every 8 (eight) hours as needed for nausea.    Dispense:  40 tablet    Refill:  3  . rosuvastatin (CRESTOR) 40 MG tablet    Sig: Take 1 tablet (40 mg total) by mouth daily.    Dispense:  90 tablet    Refill:  1     Penni Homans, MD   I discussed the assessment and  treatment plan with the patient. The patient was provided an opportunity to ask questions and all were answered. The patient agreed with the plan and demonstrated an understanding of the instructions.   The patient was advised to call back or seek an in-person evaluation if the symptoms worsen or if the condition fails to improve as anticipated.  I provided 25 minutes of non-face-to-face time during this encounter.   Penni Homans, MD

## 2018-11-19 NOTE — Assessment & Plan Note (Signed)
Avoid offending foods, start probiotics. Do not eat large meals in late evening and consider raising head of bed. Refill phenergan

## 2018-11-19 NOTE — Assessment & Plan Note (Signed)
Tolerating statin, encouraged heart healthy diet, avoid trans fats, minimize simple carbs and saturated fats. Increase exercise as tolerated 

## 2018-11-19 NOTE — Assessment & Plan Note (Signed)
hgba1c acceptable, minimize simple carbs. Increase exercise as tolerated.  

## 2018-11-19 NOTE — Assessment & Plan Note (Signed)
Refilled Omeprazole 

## 2018-11-19 NOTE — Assessment & Plan Note (Signed)
Flonase prn

## 2018-12-12 MED FILL — ROSUVASTATIN CALCIUM 40 MG: 40 | 90 days supply | Qty: 90 | Fill #0

## 2018-12-18 MED FILL — POTASSIUM CHLORIDE CRYS ER: 20 | 50 days supply | Qty: 100 | Fill #0

## 2018-12-26 MED FILL — FAMOTIDINE 40 MG TABLET: 40 | 30 days supply | Qty: 30 | Fill #0

## 2019-01-10 DIAGNOSIS — G2581 Restless legs syndrome: Secondary | ICD-10-CM | POA: Diagnosis not present

## 2019-01-10 DIAGNOSIS — F3181 Bipolar II disorder: Secondary | ICD-10-CM | POA: Diagnosis not present

## 2019-01-19 MED FILL — BACLOFEN 20 MG TABS: 20 | 90 days supply | Qty: 90 | Fill #2

## 2019-02-16 MED FILL — FAMOTIDINE 40 MG TABLET: 40 | 30 days supply | Qty: 30 | Fill #1

## 2019-02-16 MED FILL — OMEPRAZOLE 40 MG CPDR: 40 | 90 days supply | Qty: 90 | Fill #0

## 2019-02-16 MED FILL — PROMETHAZINE 25 MG TABLET: 25 | 13 days supply | Qty: 40 | Fill #0

## 2019-03-15 ENCOUNTER — Ambulatory Visit: Payer: Medicare Other | Admitting: Family Medicine

## 2019-03-16 MED FILL — ROSUVASTATIN CALCIUM 40 MG: 40 | 90 days supply | Qty: 90 | Fill #1

## 2019-03-16 MED FILL — FAMOTIDINE 40 MG TABLET: 40 | 30 days supply | Qty: 30 | Fill #2

## 2019-04-13 ENCOUNTER — Encounter: Payer: Self-pay | Admitting: Gastroenterology

## 2019-04-13 MED FILL — BACLOFEN 20 MG TABS: 20 | 90 days supply | Qty: 90 | Fill #3

## 2019-04-13 MED FILL — POTASSIUM CHLORIDE CRYS ER: 20 | 50 days supply | Qty: 100 | Fill #1

## 2019-04-13 MED FILL — FAMOTIDINE 40 MG TABLET: 40 | 30 days supply | Qty: 30 | Fill #3

## 2019-04-13 MED FILL — FUROSEMIDE 40 MG TAB: 40 | 60 days supply | Qty: 120 | Fill #1

## 2019-04-13 MED FILL — PROMETHAZINE 25 MG TABLET: 25 | 13 days supply | Qty: 40 | Fill #1

## 2019-04-16 ENCOUNTER — Encounter: Payer: Self-pay | Admitting: Family Medicine

## 2019-04-16 ENCOUNTER — Other Ambulatory Visit: Payer: Self-pay

## 2019-04-16 ENCOUNTER — Ambulatory Visit (INDEPENDENT_AMBULATORY_CARE_PROVIDER_SITE_OTHER): Payer: Medicare Other | Admitting: Family Medicine

## 2019-04-16 VITALS — BP 110/72 | HR 78 | Temp 96.8°F | Resp 18 | Wt 221.2 lb

## 2019-04-16 DIAGNOSIS — F418 Other specified anxiety disorders: Secondary | ICD-10-CM

## 2019-04-16 DIAGNOSIS — R600 Localized edema: Secondary | ICD-10-CM

## 2019-04-16 DIAGNOSIS — R739 Hyperglycemia, unspecified: Secondary | ICD-10-CM | POA: Diagnosis not present

## 2019-04-16 DIAGNOSIS — R252 Cramp and spasm: Secondary | ICD-10-CM | POA: Diagnosis not present

## 2019-04-16 DIAGNOSIS — J209 Acute bronchitis, unspecified: Secondary | ICD-10-CM | POA: Diagnosis not present

## 2019-04-16 DIAGNOSIS — E782 Mixed hyperlipidemia: Secondary | ICD-10-CM | POA: Diagnosis not present

## 2019-04-16 DIAGNOSIS — R110 Nausea: Secondary | ICD-10-CM

## 2019-04-16 DIAGNOSIS — K219 Gastro-esophageal reflux disease without esophagitis: Secondary | ICD-10-CM | POA: Diagnosis not present

## 2019-04-16 DIAGNOSIS — E669 Obesity, unspecified: Secondary | ICD-10-CM

## 2019-04-16 DIAGNOSIS — Z23 Encounter for immunization: Secondary | ICD-10-CM

## 2019-04-16 LAB — COMPREHENSIVE METABOLIC PANEL
ALT: 55 U/L — ABNORMAL HIGH (ref 0–35)
AST: 35 U/L (ref 0–37)
Albumin: 4.8 g/dL (ref 3.5–5.2)
Alkaline Phosphatase: 70 U/L (ref 39–117)
BUN: 13 mg/dL (ref 6–23)
CO2: 28 mEq/L (ref 19–32)
Calcium: 9.9 mg/dL (ref 8.4–10.5)
Chloride: 104 mEq/L (ref 96–112)
Creatinine, Ser: 0.84 mg/dL (ref 0.40–1.20)
GFR: 68.87 mL/min (ref >60.00)
Glucose, Bld: 87 mg/dL (ref 70–99)
Potassium: 4.4 mEq/L (ref 3.5–5.1)
Sodium: 143 mEq/L (ref 135–145)
Total Bilirubin: 0.8 mg/dL (ref 0.2–1.2)
Total Protein: 7.2 g/dL (ref 6.0–8.3)

## 2019-04-16 LAB — LIPID PANEL
Cholesterol: 161 mg/dL (ref 0–200)
HDL: 54.9 mg/dL (ref >39.00)
NonHDL: 106.01
Total CHOL/HDL Ratio: 3
Triglycerides: 214 mg/dL — ABNORMAL HIGH (ref 0.0–149.0)
VLDL: 42.8 mg/dL — ABNORMAL HIGH (ref 0.0–40.0)

## 2019-04-16 LAB — TSH: TSH: 1.13 u[IU]/mL (ref 0.35–4.50)

## 2019-04-16 LAB — CBC
HCT: 43.9 % (ref 36.0–46.0)
Hemoglobin: 14.5 g/dL (ref 12.0–15.0)
MCHC: 33 g/dL (ref 30.0–36.0)
MCV: 87.7 fl (ref 78.0–100.0)
Platelets: 201 10*3/uL (ref 150.0–400.0)
RBC: 5.01 Mil/uL (ref 3.87–5.11)
RDW: 13.1 % (ref 11.5–15.5)
WBC: 7.1 10*3/uL (ref 4.0–10.5)

## 2019-04-16 LAB — LDL CHOLESTEROL, DIRECT: Direct LDL: 79 mg/dL

## 2019-04-16 LAB — HEMOGLOBIN A1C: Hgb A1c MFr Bld: 5.8 % (ref 4.6–6.5)

## 2019-04-16 LAB — MAGNESIUM: Magnesium: 2.2 mg/dL (ref 1.5–2.5)

## 2019-04-16 MED ORDER — FUROSEMIDE 40 MG PO TABS: ORAL_TABLET | ORAL | 1 refills | Status: DC

## 2019-04-16 MED ORDER — ROSUVASTATIN CALCIUM 40 MG PO TABS: 40.0000 mg | ORAL_TABLET | Freq: Every day | ORAL | 1 refills | Status: DC

## 2019-04-16 MED ORDER — PROMETHAZINE HCL 25 MG PO TABS: 25.0000 mg | ORAL_TABLET | Freq: Three times a day (TID) | ORAL | 3 refills | Status: DC | PRN

## 2019-04-16 MED ORDER — ALBUTEROL SULFATE HFA 108 (90 BASE) MCG/ACT IN AERS: INHALATION_SPRAY | RESPIRATORY_TRACT | 3 refills | Status: DC

## 2019-04-16 MED ORDER — ROPINIROLE HCL 1 MG PO TABS: 1.0000 mg | ORAL_TABLET | Freq: Every day | ORAL | 3 refills | Status: DC

## 2019-04-16 MED ORDER — FAMOTIDINE 40 MG PO TABS: 40.0000 mg | ORAL_TABLET | Freq: Every day | ORAL | 1 refills | Status: DC

## 2019-04-16 MED ORDER — BACLOFEN 20 MG PO TABS: ORAL_TABLET | ORAL | 3 refills | Status: DC

## 2019-04-16 MED ORDER — POTASSIUM CHLORIDE CRYS ER 20 MEQ PO TBCR: EXTENDED_RELEASE_TABLET | ORAL | 1 refills | Status: DC

## 2019-04-16 MED ORDER — OMEPRAZOLE 40 MG PO CPDR: 40.0000 mg | DELAYED_RELEASE_CAPSULE | Freq: Every day | ORAL | 1 refills | Status: DC

## 2019-04-16 MED FILL — VENTOLIN HFA 90 MCG INHALER: 108 (90 BAS | 25 days supply | Qty: 18 | Fill #0

## 2019-04-16 NOTE — Assessment & Plan Note (Signed)
Check labs today, no polyuria or polydipsia.  minimize simple carbs. Increase exercise as tolerated

## 2019-04-16 NOTE — Assessment & Plan Note (Signed)
She continues to follow with psychiatry and overall is getting by well during the pandemic, is staying in most days

## 2019-04-16 NOTE — Assessment & Plan Note (Signed)
Avoid offending foods, start probiotics. Do not eat large meals in late evening and consider raising head of bed.  

## 2019-04-16 NOTE — Assessment & Plan Note (Signed)
Persistent but stable, is using compression hose with some relief

## 2019-04-16 NOTE — Progress Notes (Signed)
Subjective:    Patient ID: [name], female    DOB: 06/10/1956, 63 y.o.   MRN: [id number]  No chief complaint on file.   HPI Patient is in today for follow up on chronic medical concerns including hyperlipidemia, hyperglycemia, reflux and chronic pain. She is doing well for the most part. Quarantining well during the pandemic. No recent febrile illness or hospitalizations. Is trying to stay active and away from others and masking well. Is noting some allergic symptoms at times and some edema which is stable. Denies CP/palp/SOB/HA/congestion/fevers/GI or GU c/o. Taking meds as prescribed  Past Medical History:  Diagnosis Date  . Acute sinusitis 03/27/2013  . Allergy    seasonal  . Anxiety   . Arthritis 06/09/2013  . Depression   . Dyspareunia   . Edema extremities   . Encounter for preventative adult health care exam with abnormal findings 04/21/2014  . GERD (gastroesophageal reflux disease)   . Hip pain, left 11/01/2011  . History of proteinuria syndrome   . Hyperlipidemia   . Insomnia 03/11/2013  . Low back pain 05/09/2011  . Lymphadenitis 06/16/2012  . Myalgia and myositis 02/15/2015  . Obese   . Onychomycosis 05/09/2011  . Oral lesion 08/26/2013  . Otitis externa 06/23/2012   left  . Perimenopause 03/11/2013  . RLS (restless legs syndrome)   . RUQ pain 09/20/2011  . Sleep apnea 04/21/2014  . Tinea corporis 04/26/2017  . Unspecified constipation 05/13/2013  . Urinary frequency 08/26/2013    Past Surgical History:  Procedure Laterality Date  . BACK SURGERY  2006   low, discectomy for herniated disc  . BLADDER SUSPENSION N/A 11/07/2012   Procedure: [facility];  Surgeon: Malka So, MD;  Location: [facility];  Service: Urology;  Laterality: N/A;  . CARPAL TUNNEL RELEASE Right 2006  . CESAREAN SECTION  1993  . CYSTOSCOPY N/A 11/07/2012   Procedure: CYSTOSCOPY;  Surgeon: Malka So, MD;  Location: [facility];  Service: Urology;   Laterality: N/A;  . TUBAL LIGATION  1993    Family History  Problem Relation Age of Onset  . Cancer Mother 55       thyroid, malignant brain tumor  . Arthritis Mother        neck, back  . Hepatitis Sister        Hepatitis C  . Heart disease Sister        stent  . Diabetes Maternal Grandmother   . Arthritis Father        back pain  . Heart disease Father        stent    Social History   Socioeconomic History  . Marital status: Married    Spouse name: Not on file  . Number of children: Not on file  . Years of education: Not on file  . Highest education level: Not on file  Occupational History  . Not on file  Social Needs  . Financial resource strain: Not on file  . Food insecurity    Worry: Not on file    Inability: Not on file  . Transportation needs    Medical: Not on file    Non-medical: Not on file  Tobacco Use  . Smoking status: Former Smoker    Packs/day: 0.50    Years: 20.00    Pack years: 10.00    Types: Cigarettes    Quit date: 07/12/1992    Years since quitting: 26.7  . Smokeless tobacco:  Never Used  Substance and Sexual Activity  . Alcohol use: Yes    Comment: occasionaly  . Drug use: No  . Sexual activity: Yes    Partners: Male  Lifestyle  . Physical activity    Days per week: Not on file    Minutes per session: Not on file  . Stress: Not on file  Relationships  . Social Herbalist on phone: Not on file    Gets together: Not on file    Attends religious service: Not on file    Active member of club or organization: Not on file    Attends meetings of clubs or organizations: Not on file    Relationship status: Not on file  . Intimate partner violence    Fear of current or ex partner: Not on file    Emotionally abused: Not on file    Physically abused: Not on file    Forced sexual activity: Not on file  Other Topics Concern  . Not on file  Social History Narrative  . Not on file    Outpatient Medications Prior to Visit   Medication Sig Dispense Refill  . BIOTIN PO Take by mouth.    . Calcium Carbonate-Vitamin D (CALTRATE 600+D) 600-400 MG-UNIT per tablet Take 1 tablet by mouth daily.     . Cholecalciferol (VITAMIN D3) 1000 UNITS CAPS Take by mouth.      . diazepam (VALIUM) 10 MG tablet Take 1 tablet (10 mg total) by mouth daily as needed for anxiety. 40 tablet 1  . FLAXSEED, LINSEED, PO Take by mouth every other day.     . fluticasone (FLONASE) 50 MCG/ACT nasal spray PLACE 2 SPRAYS INTO BOTH NOSTRILS DAILY. (Patient not taking: Reported on 05/30/2018) 16 g 6  . hydrOXYzine (ATARAX/VISTARIL) 50 MG tablet Take 50 mg by mouth every 6 (six) hours as needed.    . lamoTRIgine (LAMICTAL) 150 MG tablet Take 150 mg by mouth daily.    . Nutritional Supplements (ESTROVEN PO) Take by mouth.    Marland Kitchen albuterol (PROVENTIL HFA;VENTOLIN HFA) 108 (90 Base) MCG/ACT inhaler 1-2 puffs q4h prn cough, wheeze, or chest tightness 1 Inhaler 0  . baclofen (LIORESAL) 20 MG tablet TAKE 1 TABLET (20 MG) BY MOUTH AT BEDTIME AS NEEDED FOR MUSCLE SPASMS. 90 tablet 3  . clotrimazole-betamethasone (LOTRISONE) cream Apply 1 application topically 2 (two) times daily. 45 g 01  . famotidine (PEPCID) 40 MG tablet TAKE 1 TABLET (40 MG TOTAL) BY MOUTH AT BEDTIME. 90 tablet 1  . furosemide (LASIX) 40 MG tablet 1 tab po daily and a second tab daily prn increased edema or weight gain > 3#/24 hours. 120 tablet 1  . omeprazole (PRILOSEC) 40 MG capsule Take 1 capsule (40 mg total) by mouth daily. 90 capsule 1  . potassium chloride SA (K-DUR) 20 MEQ tablet TAKE 1 TABLET (20 MEQ TOTAL) BY MOUTH DAILY. TAKE A 2ND TABLET DIALY AS NEEDED 100 tablet 1  . promethazine (PHENERGAN) 25 MG tablet Take 1 tablet (25 mg total) by mouth every 8 (eight) hours as needed for nausea. 40 tablet 3  . rOPINIRole (REQUIP) 1 MG tablet Take 1 mg by mouth daily.      . rosuvastatin (CRESTOR) 40 MG tablet Take 1 tablet (40 mg total) by mouth daily. 90 tablet 1   No facility-administered  medications prior to visit.     Allergies  Allergen Reactions  . Sulfur Diarrhea and Nausea And Vomiting  . Carbamazepine Hives  .  Ceclor [Cefaclor]     Tongue swell  . Morphine And Related Itching  . Atorvastatin Other (See Comments)    Leg pain    Review of Systems  Constitutional: Positive for malaise/fatigue. Negative for fever.  HENT: Negative for congestion.   Eyes: Negative for blurred vision.  Respiratory: Negative for shortness of breath.   Cardiovascular: Negative for chest pain, palpitations and leg swelling.  Gastrointestinal: Negative for abdominal pain, blood in stool and nausea.  Genitourinary: Negative for dysuria and frequency.  Musculoskeletal: Positive for back pain, joint pain and myalgias. Negative for falls.  Skin: Negative for rash.  Neurological: Negative for dizziness, loss of consciousness and headaches.  Endo/Heme/Allergies: Negative for environmental allergies.  Psychiatric/Behavioral: Positive for depression. Negative for suicidal ideas. The patient is not nervous/anxious.        Objective:    Physical Exam Vitals signs and nursing note reviewed.  Constitutional:      General: She is not in acute distress.    Appearance: She is well-developed.  HENT:     Head: Normocephalic and atraumatic.     Nose: Nose normal.  Eyes:     General:        Right eye: No discharge.        Left eye: No discharge.  Neck:     Musculoskeletal: Normal range of motion and neck supple.  Cardiovascular:     Rate and Rhythm: Normal rate and regular rhythm.     Heart sounds: No murmur.  Pulmonary:     Effort: Pulmonary effort is normal.     Breath sounds: Normal breath sounds.  Abdominal:     General: Bowel sounds are normal.     Palpations: Abdomen is soft.     Tenderness: There is no abdominal tenderness.  Skin:    General: Skin is warm and dry.  Neurological:     Mental Status: She is alert and oriented to person, place, and time.     BP 110/72 (BP  Location: Left Arm, Patient Position: Sitting, Cuff Size: Normal)   Pulse 78   Temp (!) 96.8 F (36 C) (Temporal)   Resp 18   Wt 221 lb 3.2 oz (100.3 kg)   LMP 10/03/2012   SpO2 98%   BMI 36.81 kg/m  Wt Readings from Last 3 Encounters:  04/16/19 221 lb 3.2 oz (100.3 kg)  11/16/18 227 lb 6.4 oz (103.1 kg)  05/30/18 221 lb 8 oz (100.5 kg)    Diabetic Foot Exam - Simple   No data filed     Lab Results  Component Value Date   WBC 7.2 05/09/2018   HGB 14.1 05/09/2018   HCT 42.0 05/09/2018   PLT 228.0 05/09/2018   GLUCOSE 88 05/09/2018   CHOL 167 05/09/2018   TRIG 329.0 (H) 05/09/2018   HDL 50.40 05/09/2018   LDLDIRECT 85.0 05/09/2018   LDLCALC 117 (H) 04/18/2014   ALT 52 (H) 05/09/2018   AST 33 05/09/2018   NA 143 05/09/2018   K 4.3 05/09/2018   CL 106 05/09/2018   CREATININE 1.01 05/09/2018   BUN 14 05/09/2018   CO2 28 05/09/2018   TSH 1.43 05/09/2018   HGBA1C 5.5 05/09/2018    Lab Results  Component Value Date   TSH 1.43 05/09/2018   Lab Results  Component Value Date   WBC 7.2 05/09/2018   HGB 14.1 05/09/2018   HCT 42.0 05/09/2018   MCV 88.1 05/09/2018   PLT 228.0 05/09/2018   Lab Results  Component Value Date   NA 143 05/09/2018   K 4.3 05/09/2018   CO2 28 05/09/2018   GLUCOSE 88 05/09/2018   BUN 14 05/09/2018   CREATININE 1.01 05/09/2018   BILITOT 0.6 05/09/2018   ALKPHOS 61 05/09/2018   AST 33 05/09/2018   ALT 52 (H) 05/09/2018   PROT 6.9 05/09/2018   ALBUMIN 4.7 05/09/2018   CALCIUM 9.4 05/09/2018   ANIONGAP 13 01/22/2015   GFR 59.35 (L) 05/09/2018   Lab Results  Component Value Date   CHOL 167 05/09/2018   Lab Results  Component Value Date   HDL 50.40 05/09/2018   Lab Results  Component Value Date   LDLCALC 117 (H) 04/18/2014   Lab Results  Component Value Date   TRIG 329.0 (H) 05/09/2018   Lab Results  Component Value Date   CHOLHDL 3 05/09/2018   Lab Results  Component Value Date   HGBA1C 5.5 05/09/2018        Assessment & Plan:   Problem List Items Addressed This Visit    Depression with anxiety    She continues to follow with psychiatry and overall is getting by well during the pandemic, is staying in most days      Obese   Relevant Orders   CBC   Comprehensive metabolic panel   TSH   Nausea   Edema of extremities    Persistent but stable, is using compression hose with some relief      GERD (gastroesophageal reflux disease)    Avoid offending foods, start probiotics. Do not eat large meals in late evening and consider raising head of bed.       Relevant Medications   famotidine (PEPCID) 40 MG tablet   omeprazole (PRILOSEC) 40 MG capsule   Hyperglycemia    Check labs today, no polyuria or polydipsia.  minimize simple carbs. Increase exercise as tolerated      Relevant Orders   Hemoglobin A1c   Muscle cramp   Relevant Orders   Magnesium    Other Visit Diagnoses    Needs flu shot    -  Primary   Relevant Orders   Flu Vaccine QUAD 6+ mos PF IM (Fluarix Quad PF) (Completed)   Acute bronchitis, unspecified organism       Hyperlipidemia, mixed       Relevant Medications   rosuvastatin (CRESTOR) 40 MG tablet   furosemide (LASIX) 40 MG tablet   Other Relevant Orders   Lipid panel   TSH      I have discontinued [name] clotrimazole-betamethasone. I have also changed her potassium chloride SA, albuterol, and rOPINIRole. Additionally, I am having her maintain her Calcium Carbonate-Vitamin D, (FLAXSEED, LINSEED, PO), Vitamin D3, BIOTIN PO, Nutritional Supplements (ESTROVEN PO), hydrOXYzine, fluticasone, lamoTRIgine, diazepam, promethazine, baclofen, famotidine, rosuvastatin, furosemide, and omeprazole.  Meds ordered this encounter  Medications  . promethazine (PHENERGAN) 25 MG tablet    Sig: Take 1 tablet (25 mg total) by mouth every 8 (eight) hours as needed for nausea.    Dispense:  40 tablet    Refill:  3  . baclofen (LIORESAL) 20 MG tablet    Sig: TAKE 1  TABLET (20 MG) BY MOUTH AT BEDTIME AS NEEDED FOR MUSCLE SPASMS.    Dispense:  90 tablet    Refill:  3  . famotidine (PEPCID) 40 MG tablet    Sig: Take 1 tablet (40 mg total) by mouth at bedtime.    Dispense:  90 tablet  Refill:  1  . rosuvastatin (CRESTOR) 40 MG tablet    Sig: Take 1 tablet (40 mg total) by mouth daily.    Dispense:  90 tablet    Refill:  1  . furosemide (LASIX) 40 MG tablet    Sig: 1 tab po daily and a second tab daily prn increased edema or weight gain > 3#/24 hours.    Dispense:  120 tablet    Refill:  1  . potassium chloride SA (KLOR-CON) 20 MEQ tablet    Sig: TAKE 1 TABLET (20 MEQ TOTAL) BY MOUTH DAILY. TAKE A 2ND TABLET DIALY AS NEEDED    Dispense:  100 tablet    Refill:  1  . omeprazole (PRILOSEC) 40 MG capsule    Sig: Take 1 capsule (40 mg total) by mouth daily.    Dispense:  90 capsule    Refill:  1  . albuterol (VENTOLIN HFA) 108 (90 Base) MCG/ACT inhaler    Sig: 1-2 puffs q4h prn cough, wheeze, or chest tightness    Dispense:  18 g    Refill:  3    May dispense whichever albuterol HFA is preferred by her insurer.  Marland Kitchen rOPINIRole (REQUIP) 1 MG tablet    Sig: Take 1 tablet (1 mg total) by mouth daily.    Dispense:  90 tablet    Refill:  3     Penni Homans, MD

## 2019-04-16 NOTE — Patient Instructions (Signed)
Pulse oximeter at [facility], [employer] or [employer] Dyslipidemia Dyslipidemia is an imbalance of waxy, fat-like substances (lipids) in the blood. The body needs lipids in small amounts. Dyslipidemia often involves a high level of cholesterol or triglycerides, which are types of lipids. Common forms of dyslipidemia include:  High levels of LDL cholesterol. LDL is the type of cholesterol that causes fatty deposits (plaques) to build up in the blood vessels that carry blood away from your heart (arteries).  Low levels of HDL cholesterol. HDL cholesterol is the type of cholesterol that protects against heart disease. High levels of HDL remove the LDL buildup from arteries.  High levels of triglycerides. Triglycerides are a fatty substance in the blood that is linked to a buildup of plaques in the arteries. What are the causes? Primary dyslipidemia is caused by changes (mutations) in genes that are passed down through families (inherited). These mutations cause several types of dyslipidemia. Secondary dyslipidemia is caused by lifestyle choices and diseases that lead to dyslipidemia, such as:  Eating a diet that is high in animal fat.  Not getting enough exercise.  Having diabetes, kidney disease, liver disease, or thyroid disease.  Drinking large amounts of alcohol.  Using certain medicines. What increases the risk? You are more likely to develop this condition if you are an older man or if you are a woman who has gone through menopause. Other risk factors include:  Having a family history of dyslipidemia.  Taking certain medicines, including birth control pills, steroids, some diuretics, and beta-blockers.  Smoking cigarettes.  Eating a high-fat diet.  Having certain medical conditions such as diabetes, polycystic ovary syndrome (PCOS), kidney disease, liver disease, or hypothyroidism.  Not exercising regularly.  Being overweight or obese with too much belly fat. What are the signs or symptoms?  In most cases, dyslipidemia does not usually cause any symptoms. In severe cases, very high lipid levels can cause:  Fatty bumps under the skin (xanthomas).  White or gray ring around the black center (pupil) of the eye. Very high triglyceride levels can cause inflammation of the pancreas (pancreatitis). How is this diagnosed? Your health care provider may diagnose dyslipidemia based on a routine blood test (fasting blood test). Because most people do not have symptoms of the condition, this blood testing (lipid profile) is done on adults age 18 and older and is repeated every 5 years. This test checks:  Total cholesterol. This measures the total amount of cholesterol in your blood, including LDL cholesterol, HDL cholesterol, and triglycerides. A healthy number is below 200.  LDL cholesterol. The target number for LDL cholesterol is different for each person, depending on individual risk factors. Ask your health care provider what your LDL cholesterol should be.  HDL cholesterol. An HDL level of 60 or higher is best because it helps to protect against heart disease. A number below 58 for men or below 66 for women increases the risk for heart disease.  Triglycerides. A healthy triglyceride number is below 150. If your lipid profile is abnormal, your health care provider may do other blood tests. How is this treated? Treatment depends on the type of dyslipidemia that you have and your other risk factors for heart disease and stroke. Your health care provider will have a target range for your lipid levels based on this information. For many people, this condition may be treated by lifestyle changes, such as diet and exercise. Your health care provider may recommend that you:  Get regular exercise.  Make changes to  your diet.  Quit smoking if you smoke. If diet changes and exercise do not help you reach your goals, your health care provider may also prescribe medicine to lower lipids. The  most commonly prescribed type of medicine lowers your LDL cholesterol (statin drug). If you have a high triglyceride level, your provider may prescribe another type of drug (fibrate) or an omega-3 fish oil supplement, or both. Follow these instructions at home:  Eating and drinking  Follow instructions from your health care provider or dietitian about eating or drinking restrictions.  Eat a healthy diet as told by your health care provider. This can help you reach and maintain a healthy weight, lower your LDL cholesterol, and raise your HDL cholesterol. This may include: ? Limiting your calories, if you are overweight. ? Eating more fruits, vegetables, whole grains, fish, and lean meats. ? Limiting saturated fat, trans fat, and cholesterol.  If you drink alcohol: ? Limit how much you use. ? Be aware of how much alcohol is in your drink. In the [place] one drink equals one 12 oz bottle of beer (355 mL), one 5 oz glass of wine (148 mL), or one 1 oz glass of hard liquor (44 mL).  Do not drink alcohol if: ? Your health care provider tells you not to drink. ? You are pregnant, may be pregnant, or are planning to become pregnant. Activity  Get regular exercise. Start an exercise and strength training program as told by your health care provider. Ask your health care provider what activities are safe for you. Your health care provider may recommend: ? 30 minutes of aerobic activity 4-6 days a week. Brisk walking is an example of aerobic activity. ? Strength training 2 days a week. General instructions  Do not use any products that contain nicotine or tobacco, such as cigarettes, e-cigarettes, and chewing tobacco. If you need help quitting, ask your health care provider.  Take over-the-counter and prescription medicines only as told by your health care provider. This includes supplements.  Keep all follow-up visits as told by your health care provider. Contact a health care provider if:  You  are: ? Having trouble sticking to your exercise or diet plan. ? Struggling to quit smoking or control your use of alcohol. Summary  Dyslipidemia often involves a high level of cholesterol or triglycerides, which are types of lipids.  Treatment depends on the type of dyslipidemia that you have and your other risk factors for heart disease and stroke.  For many people, treatment starts with lifestyle changes, such as diet and exercise.  Your health care provider may prescribe medicine to lower lipids. This information is not intended to replace advice given to you by your health care provider. Make sure you discuss any questions you have with your health care provider. Document Released: 07/03/2013 Document Revised: 02/20/2018 Document Reviewed: 01/27/2018 Elsevier Patient Education  Agency vitals weekly, want oxygen in the 90s  Start a Multivitamin with minerals, selenium, zinc, vitamin c and vitamin

## 2019-05-15 ENCOUNTER — Encounter: Payer: Medicare Other | Admitting: Family Medicine

## 2019-05-21 MED FILL — FAMOTIDINE 40 MG TABLET: 40 | 30 days supply | Qty: 30 | Fill #4

## 2019-05-21 MED FILL — PROMETHAZINE 25 MG TABLET: 25 | 13 days supply | Qty: 40 | Fill #2

## 2019-05-21 MED FILL — OMEPRAZOLE 40 MG CPDR: 40 | 90 days supply | Qty: 90 | Fill #1

## 2019-06-12 MED FILL — ROSUVASTATIN CALCIUM 40 MG: 40 | 90 days supply | Qty: 90 | Fill #0

## 2019-06-19 MED FILL — PROMETHAZINE 25 MG TABLET: 25 | 13 days supply | Qty: 40 | Fill #3

## 2019-06-19 MED FILL — FAMOTIDINE 40 MG TABLET: 40 | 30 days supply | Qty: 30 | Fill #5

## 2019-08-14 ENCOUNTER — Other Ambulatory Visit: Payer: Self-pay

## 2019-08-14 ENCOUNTER — Ambulatory Visit (INDEPENDENT_AMBULATORY_CARE_PROVIDER_SITE_OTHER): Payer: Medicare Other | Admitting: Family Medicine

## 2019-08-14 DIAGNOSIS — R252 Cramp and spasm: Secondary | ICD-10-CM | POA: Diagnosis not present

## 2019-08-14 DIAGNOSIS — R739 Hyperglycemia, unspecified: Secondary | ICD-10-CM

## 2019-08-14 DIAGNOSIS — K219 Gastro-esophageal reflux disease without esophagitis: Secondary | ICD-10-CM | POA: Diagnosis not present

## 2019-08-14 DIAGNOSIS — M791 Myalgia, unspecified site: Secondary | ICD-10-CM

## 2019-08-14 DIAGNOSIS — E782 Mixed hyperlipidemia: Secondary | ICD-10-CM

## 2019-08-14 DIAGNOSIS — R600 Localized edema: Secondary | ICD-10-CM

## 2019-08-14 DIAGNOSIS — G2581 Restless legs syndrome: Secondary | ICD-10-CM

## 2019-08-14 MED ORDER — FAMOTIDINE 40 MG PO TABS: 40.0000 mg | ORAL_TABLET | Freq: Every day | ORAL | 1 refills | Status: DC

## 2019-08-14 MED ORDER — POTASSIUM CHLORIDE CRYS ER 20 MEQ PO TBCR: EXTENDED_RELEASE_TABLET | ORAL | 1 refills | Status: DC

## 2019-08-14 MED ORDER — OMEPRAZOLE 40 MG PO CPDR: 40.0000 mg | DELAYED_RELEASE_CAPSULE | Freq: Every day | ORAL | 1 refills | Status: DC

## 2019-08-14 MED ORDER — PROMETHAZINE HCL 25 MG PO TABS: 25.0000 mg | ORAL_TABLET | Freq: Three times a day (TID) | ORAL | 3 refills | Status: DC | PRN

## 2019-08-14 MED ORDER — BACLOFEN 20 MG PO TABS: ORAL_TABLET | ORAL | 3 refills | Status: DC

## 2019-08-14 MED ORDER — TORSEMIDE 20 MG PO TABS: 20.0000 mg | ORAL_TABLET | Freq: Every day | ORAL | 2 refills | Status: DC

## 2019-08-14 MED ORDER — ROSUVASTATIN CALCIUM 40 MG PO TABS: 40.0000 mg | ORAL_TABLET | Freq: Every day | ORAL | 1 refills | Status: DC

## 2019-08-14 MED FILL — OMEPRAZOLE 40 MG CPDR: 40 | 90 days supply | Qty: 90 | Fill #0

## 2019-08-14 MED FILL — PROMETHAZINE 25 MG TABLET: 25 | 14 days supply | Qty: 40 | Fill #0

## 2019-08-14 MED FILL — FAMOTIDINE 40 MG TABLET: 40 | 90 days supply | Qty: 90 | Fill #0

## 2019-08-14 MED FILL — BACLOFEN 20 MG TABS: 20 | 90 days supply | Qty: 90 | Fill #0

## 2019-08-14 MED FILL — ROSUVASTATIN CALCIUM 40 MG: 40 | 90 days supply | Qty: 90 | Fill #0

## 2019-08-14 MED FILL — TORSEMIDE 20 MG TABLET: 20 | 30 days supply | Qty: 30 | Fill #0

## 2019-08-14 MED FILL — POTASSIUM CHLORIDE CRYS ER: 20 | 50 days supply | Qty: 100 | Fill #0

## 2019-08-15 NOTE — Patient Instructions (Addendum)
[employer] Blood Pressure cuff, upper arm, want BP 100-140/60-90 Pulse oximeter, want oxygen in 90s  Weekly vitals  Take Multivitamin with minerals, selenium Vitamin D 1000-2000 IU daily Probiotic with lactobacillus and bifidophilus Asprin EC 81 mg daily  Melatonin 2-5 mg at bedtime  [email or web address] 

## 2019-08-15 NOTE — Assessment & Plan Note (Signed)
She notes it is worse when she takes Furosemide so she has stopped. She is encouraged to increase hydration, try topical rubs and will try discontinuing Furosemide and starting Torsemide. She will try Hyland's leg cramp med and daily magnesium pills. Check labs.

## 2019-08-15 NOTE — Assessment & Plan Note (Signed)
Avoid offending foods, start probiotics. Do not eat large meals in late evening and consider raising head of bed. Refills given on medications.

## 2019-08-15 NOTE — Assessment & Plan Note (Signed)
Elevate feet, compression hose, minimize sodium try Torsemide

## 2019-08-15 NOTE — Progress Notes (Signed)
Virtual Visit via Phone Note  I connected with [name] on2/2/21 at  2:00 PM EST by a phone enabled telemedicine application and verified that I am speaking with the correct person using two identifiers.  Location: Patient: home Provider: office   I discussed the limitations of evaluation and management by telemedicine and the availability of in person appointments. The patient expressed understanding and agreed to proceed. [name], CMA was able to get the patient set up on visit, video   Subjective:    Patient ID: [name], female    DOB: 1955-07-24, 64 y.o.   MRN: [id number]  No chief complaint on file.   HPI Patient is in today for follow up on chronic medical concerns. She is struggling with persistent pain and myalgias. She notes when she take her diuretic her pain and cramping worsens so she is not currently taking it and her pedal edema gets worse. She continues with psychiatry and still struggles with anhedonia. Agrees to continue following up with them. Denies CP/palp/SOB/HA/congestion/fevers/GI or GU c/o. Taking meds as prescribed  Past Medical History:  Diagnosis Date  . Acute sinusitis 03/27/2013  . Allergy    seasonal  . Anxiety   . Arthritis 06/09/2013  . Depression   . Dyspareunia   . Edema extremities   . Encounter for preventative adult health care exam with abnormal findings 04/21/2014  . GERD (gastroesophageal reflux disease)   . Hip pain, left 11/01/2011  . History of proteinuria syndrome   . Hyperlipidemia   . Insomnia 03/11/2013  . Low back pain 05/09/2011  . Lymphadenitis 06/16/2012  . Myalgia and myositis 02/15/2015  . Obese   . Onychomycosis 05/09/2011  . Oral lesion 08/26/2013  . Otitis externa 06/23/2012   left  . Perimenopause 03/11/2013  . RLS (restless legs syndrome)   . RUQ pain 09/20/2011  . Sleep apnea 04/21/2014  . Tinea corporis 04/26/2017  . Unspecified constipation 05/13/2013  . Urinary frequency 08/26/2013    Past  Surgical History:  Procedure Laterality Date  . BACK SURGERY  2006   low, discectomy for herniated disc  . BLADDER SUSPENSION N/A 11/07/2012   Procedure: [facility];  Surgeon: Malka So, MD;  Location: [facility];  Service: Urology;  Laterality: N/A;  . CARPAL TUNNEL RELEASE Right 2006  . CESAREAN SECTION  1993  . CYSTOSCOPY N/A 11/07/2012   Procedure: CYSTOSCOPY;  Surgeon: Malka So, MD;  Location: [facility];  Service: Urology;  Laterality: N/A;  . TUBAL LIGATION  1993    Family History  Problem Relation Age of Onset  . Cancer Mother 73       thyroid, malignant brain tumor  . Arthritis Mother        neck, back  . Hepatitis Sister        Hepatitis C  . Heart disease Sister        stent  . Diabetes Maternal Grandmother   . Arthritis Father        back pain  . Heart disease Father        stent    Social History   Socioeconomic History  . Marital status: Married    Spouse name: Not on file  . Number of children: Not on file  . Years of education: Not on file  . Highest education level: Not on file  Occupational History  . Not on file  Tobacco Use  . Smoking status: Former Smoker  Packs/day: 0.50    Years: 20.00    Pack years: 10.00    Types: Cigarettes    Quit date: 07/12/1992    Years since quitting: 27.1  . Smokeless tobacco: Never Used  Substance and Sexual Activity  . Alcohol use: Yes    Comment: occasionaly  . Drug use: No  . Sexual activity: Yes    Partners: Male  Other Topics Concern  . Not on file  Social History Narrative  . Not on file   Social Determinants of Health   Financial Resource Strain:   . Difficulty of Paying Living Expenses: Not on file  Food Insecurity:   . Worried About Charity fundraiser in the Last Year: Not on file  . Ran Out of Food in the Last Year: Not on file  Transportation Needs:   . Lack of Transportation (Medical): Not on file  . Lack of Transportation (Non-Medical): Not on file   Physical Activity:   . Days of Exercise per Week: Not on file  . Minutes of Exercise per Session: Not on file  Stress:   . Feeling of Stress : Not on file  Social Connections:   . Frequency of Communication with Friends and Family: Not on file  . Frequency of Social Gatherings with Friends and Family: Not on file  . Attends Religious Services: Not on file  . Active Member of Clubs or Organizations: Not on file  . Attends Archivist Meetings: Not on file  . Marital Status: Not on file  Intimate Partner Violence:   . Fear of Current or Ex-Partner: Not on file  . Emotionally Abused: Not on file  . Physically Abused: Not on file  . Sexually Abused: Not on file    Outpatient Medications Prior to Visit  Medication Sig Dispense Refill  . albuterol (VENTOLIN HFA) 108 (90 Base) MCG/ACT inhaler 1-2 puffs q4h prn cough, wheeze, or chest tightness 18 g 3  . BIOTIN PO Take by mouth.    . Calcium Carbonate-Vitamin D (CALTRATE 600+D) 600-400 MG-UNIT per tablet Take 1 tablet by mouth daily.     . Cholecalciferol (VITAMIN D3) 1000 UNITS CAPS Take by mouth.      . diazepam (VALIUM) 5 MG tablet Take 5 mg by mouth every 6 (six) hours as needed for anxiety.    Marland Kitchen FLAXSEED, LINSEED, PO Take by mouth every other day.     . hydrOXYzine (ATARAX/VISTARIL) 50 MG tablet Take 50 mg by mouth every 6 (six) hours as needed.    . lamoTRIgine (LAMICTAL) 150 MG tablet Take 150 mg by mouth daily.    . Nutritional Supplements (ESTROVEN PO) Take by mouth.    Marland Kitchen rOPINIRole (REQUIP) 1 MG tablet Take 1 tablet (1 mg total) by mouth daily. 90 tablet 3  . baclofen (LIORESAL) 20 MG tablet TAKE 1 TABLET (20 MG) BY MOUTH AT BEDTIME AS NEEDED FOR MUSCLE SPASMS. 90 tablet 3  . famotidine (PEPCID) 40 MG tablet Take 1 tablet (40 mg total) by mouth at bedtime. 90 tablet 1  . furosemide (LASIX) 40 MG tablet 1 tab po daily and a second tab daily prn increased edema or weight gain > 3#/24 hours. 120 tablet 1  . omeprazole  (PRILOSEC) 40 MG capsule Take 1 capsule (40 mg total) by mouth daily. 90 capsule 1  . potassium chloride SA (KLOR-CON) 20 MEQ tablet TAKE 1 TABLET (20 MEQ TOTAL) BY MOUTH DAILY. TAKE A 2ND TABLET DIALY AS NEEDED 100 tablet 1  .  promethazine (PHENERGAN) 25 MG tablet Take 1 tablet (25 mg total) by mouth every 8 (eight) hours as needed for nausea. 40 tablet 3  . rosuvastatin (CRESTOR) 40 MG tablet Take 1 tablet (40 mg total) by mouth daily. 90 tablet 1  . fluticasone (FLONASE) 50 MCG/ACT nasal spray PLACE 2 SPRAYS INTO BOTH NOSTRILS DAILY. (Patient not taking: Reported on 05/30/2018) 16 g 6  . diazepam (VALIUM) 10 MG tablet Take 1 tablet (10 mg total) by mouth daily as needed for anxiety. 40 tablet 1   No facility-administered medications prior to visit.    Allergies  Allergen Reactions  . Sulfur Diarrhea and Nausea And Vomiting  . Carbamazepine Hives  . Ceclor [Cefaclor]     Tongue swell  . Morphine And Related Itching  . Atorvastatin Other (See Comments)    Leg pain    Review of Systems  Constitutional: Positive for malaise/fatigue. Negative for fever.  HENT: Negative for congestion.   Eyes: Negative for blurred vision.  Respiratory: Negative for shortness of breath.   Cardiovascular: Positive for leg swelling. Negative for chest pain and palpitations.  Gastrointestinal: Negative for abdominal pain, blood in stool and nausea.  Genitourinary: Negative for dysuria and frequency.  Musculoskeletal: Positive for back pain, joint pain and myalgias. Negative for falls.  Skin: Negative for rash.  Neurological: Negative for dizziness, loss of consciousness and headaches.  Endo/Heme/Allergies: Negative for environmental allergies.  Psychiatric/Behavioral: Negative for depression. The patient is not nervous/anxious.        Objective:    Physical Exam  Wt 224 lb (101.6 kg)   LMP 10/03/2012   BMI 37.28 kg/m  Wt Readings from Last 3 Encounters:  08/14/19 224 lb (101.6 kg)  04/16/19  221 lb 3.2 oz (100.3 kg)  11/16/18 227 lb 6.4 oz (103.1 kg)    Diabetic Foot Exam - Simple   No data filed     Lab Results  Component Value Date   WBC 7.1 04/16/2019   HGB 14.5 04/16/2019   HCT 43.9 04/16/2019   PLT 201.0 04/16/2019   GLUCOSE 87 04/16/2019   CHOL 161 04/16/2019   TRIG 214.0 (H) 04/16/2019   HDL 54.90 04/16/2019   LDLDIRECT 79.0 04/16/2019   LDLCALC 117 (H) 04/18/2014   ALT 55 (H) 04/16/2019   AST 35 04/16/2019   NA 143 04/16/2019   K 4.4 04/16/2019   CL 104 04/16/2019   CREATININE 0.84 04/16/2019   BUN 13 04/16/2019   CO2 28 04/16/2019   TSH 1.13 04/16/2019   HGBA1C 5.8 04/16/2019    Lab Results  Component Value Date   TSH 1.13 04/16/2019   Lab Results  Component Value Date   WBC 7.1 04/16/2019   HGB 14.5 04/16/2019   HCT 43.9 04/16/2019   MCV 87.7 04/16/2019   PLT 201.0 04/16/2019   Lab Results  Component Value Date   NA 143 04/16/2019   K 4.4 04/16/2019   CO2 28 04/16/2019   GLUCOSE 87 04/16/2019   BUN 13 04/16/2019   CREATININE 0.84 04/16/2019   BILITOT 0.8 04/16/2019   ALKPHOS 70 04/16/2019   AST 35 04/16/2019   ALT 55 (H) 04/16/2019   PROT 7.2 04/16/2019   ALBUMIN 4.8 04/16/2019   CALCIUM 9.9 04/16/2019   ANIONGAP 13 01/22/2015   GFR 68.87 04/16/2019   Lab Results  Component Value Date   CHOL 161 04/16/2019   Lab Results  Component Value Date   HDL 54.90 04/16/2019   Lab Results  Component Value  Date   LDLCALC 117 (H) 04/18/2014   Lab Results  Component Value Date   TRIG 214.0 (H) 04/16/2019   Lab Results  Component Value Date   CHOLHDL 3 04/16/2019   Lab Results  Component Value Date   HGBA1C 5.8 04/16/2019       Assessment & Plan:   Problem List Items Addressed This Visit    Hyperlipidemia    Tolerating statin, encouraged heart healthy diet, avoid trans fats, minimize simple carbs and saturated fats. Increase exercise as tolerated      Relevant Medications   rosuvastatin (CRESTOR) 40 MG tablet    torsemide (DEMADEX) 20 MG tablet   RLS (restless legs syndrome)    Is noting Requip is only marginally helpful and she is going to discuss increasing the dosing      GERD (gastroesophageal reflux disease)    Avoid offending foods, start probiotics. Do not eat large meals in late evening and consider raising head of bed. Refills given on medications.      Relevant Medications   omeprazole (PRILOSEC) 40 MG capsule   famotidine (PEPCID) 40 MG tablet   Myalgia    Given refill on Baclofen to use prn. Hydrate and stay active      Hyperglycemia    hgba1c acceptable, minimize simple carbs. Increase exercise as tolerated.       Muscle cramp    She notes it is worse when she takes Furosemide so she has stopped. She is encouraged to increase hydration, try topical rubs and will try discontinuing Furosemide and starting Torsemide. She will try Hyland's leg cramp med and daily magnesium pills. Check labs.          I have discontinued Vicki Mallet. Charles's furosemide. I am also having her start on torsemide. Additionally, I am having her maintain her Calcium Carbonate-Vitamin D, (FLAXSEED, LINSEED, PO), Vitamin D3, BIOTIN PO, Nutritional Supplements (ESTROVEN PO), hydrOXYzine, fluticasone, lamoTRIgine, albuterol, rOPINIRole, potassium chloride SA, omeprazole, rosuvastatin, baclofen, famotidine, promethazine, and diazepam.  Meds ordered this encounter  Medications  . potassium chloride SA (KLOR-CON) 20 MEQ tablet    Sig: TAKE 1 TABLET (20 MEQ TOTAL) BY MOUTH DAILY. TAKE A 2ND TABLET DIALY AS NEEDED    Dispense:  100 tablet    Refill:  1  . omeprazole (PRILOSEC) 40 MG capsule    Sig: Take 1 capsule (40 mg total) by mouth daily.    Dispense:  90 capsule    Refill:  1  . rosuvastatin (CRESTOR) 40 MG tablet    Sig: Take 1 tablet (40 mg total) by mouth daily.    Dispense:  90 tablet    Refill:  1  . baclofen (LIORESAL) 20 MG tablet    Sig: TAKE 1 TABLET (20 MG) BY MOUTH AT BEDTIME AS NEEDED FOR  MUSCLE SPASMS.    Dispense:  90 tablet    Refill:  3  . famotidine (PEPCID) 40 MG tablet    Sig: Take 1 tablet (40 mg total) by mouth at bedtime.    Dispense:  90 tablet    Refill:  1  . promethazine (PHENERGAN) 25 MG tablet    Sig: Take 1 tablet (25 mg total) by mouth every 8 (eight) hours as needed for nausea.    Dispense:  40 tablet    Refill:  3  . torsemide (DEMADEX) 20 MG tablet    Sig: Take 1 tablet (20 mg total) by mouth daily.    Dispense:  30 tablet  Refill:  2     I discussed the assessment and treatment plan with the patient. The patient was provided an opportunity to ask questions and all were answered. The patient agreed with the plan and demonstrated an understanding of the instructions.   The patient was advised to call back or seek an in-person evaluation if the symptoms worsen or if the condition fails to improve as anticipated.  I provided 25 minutes of non-face-to-face time during this encounter.   Penni Homans, MD

## 2019-08-15 NOTE — Assessment & Plan Note (Signed)
Tolerating statin, encouraged heart healthy diet, avoid trans fats, minimize simple carbs and saturated fats. Increase exercise as tolerated 

## 2019-08-15 NOTE — Assessment & Plan Note (Signed)
hgba1c acceptable, minimize simple carbs. Increase exercise as tolerated.  

## 2019-08-15 NOTE — Assessment & Plan Note (Signed)
Is noting Requip is only marginally helpful and she is going to discuss increasing the dosing

## 2019-08-15 NOTE — Assessment & Plan Note (Signed)
Given refill on Baclofen to use prn. Hydrate and stay active

## 2019-09-21 ENCOUNTER — Telehealth: Payer: Self-pay | Admitting: Family Medicine

## 2019-09-28 ENCOUNTER — Other Ambulatory Visit: Payer: Self-pay

## 2019-09-28 ENCOUNTER — Encounter: Payer: Self-pay | Admitting: Family Medicine

## 2019-09-28 ENCOUNTER — Ambulatory Visit (INDEPENDENT_AMBULATORY_CARE_PROVIDER_SITE_OTHER): Payer: Medicare Other | Admitting: Family Medicine

## 2019-09-28 DIAGNOSIS — R739 Hyperglycemia, unspecified: Secondary | ICD-10-CM

## 2019-09-28 DIAGNOSIS — M791 Myalgia, unspecified site: Secondary | ICD-10-CM

## 2019-09-28 DIAGNOSIS — G2581 Restless legs syndrome: Secondary | ICD-10-CM

## 2019-09-28 DIAGNOSIS — R600 Localized edema: Secondary | ICD-10-CM | POA: Diagnosis not present

## 2019-09-28 NOTE — Progress Notes (Signed)
Virtual Visit via phone Note  I connected with [name] on 09/28/19 at 10:20 AM EDT by a phone enabled telemedicine application and verified that I am speaking with the correct person using two identifiers.  Location: Patient: home Provider: home   I discussed the limitations of evaluation and management by telemedicine and the availability of in person appointments. The patient expressed understanding and agreed to proceed. [name], CMA was able to set the patient up on a phone visit after being unable to set up a video visit.   Subjective:    Patient ID: [name], female    DOB: 11-09-1955, 64 y.o.   MRN: [id number]  Chief Complaint  Patient presents with  . Follow-up    HPI Patient is in today for follow up on chronic medical concerns. No recent febrile illness or hospitalizations. No acute concerns. Pedal edema and muscle cramps are improved with switch to Torsemide. The increase in Requip has helped the RLS. Denies CP/palp/SOB/HA/congestion/fevers/GI or GU c/o. Taking meds as prescribed  Past Medical History:  Diagnosis Date  . Acute sinusitis 03/27/2013  . Allergy    seasonal  . Anxiety   . Arthritis 06/09/2013  . Depression   . Dyspareunia   . Edema extremities   . Encounter for preventative adult health care exam with abnormal findings 04/21/2014  . GERD (gastroesophageal reflux disease)   . Hip pain, left 11/01/2011  . History of proteinuria syndrome   . Hyperlipidemia   . Insomnia 03/11/2013  . Low back pain 05/09/2011  . Lymphadenitis 06/16/2012  . Myalgia and myositis 02/15/2015  . Obese   . Onychomycosis 05/09/2011  . Oral lesion 08/26/2013  . Otitis externa 06/23/2012   left  . Perimenopause 03/11/2013  . RLS (restless legs syndrome)   . RUQ pain 09/20/2011  . Sleep apnea 04/21/2014  . Tinea corporis 04/26/2017  . Unspecified constipation 05/13/2013  . Urinary frequency 08/26/2013    Past Surgical History:  Procedure Laterality Date  .  BACK SURGERY  2006   low, discectomy for herniated disc  . BLADDER SUSPENSION N/A 11/07/2012   Procedure: [facility];  Surgeon: Malka So, MD;  Location: [facility];  Service: Urology;  Laterality: N/A;  . CARPAL TUNNEL RELEASE Right 2006  . CESAREAN SECTION  1993  . CYSTOSCOPY N/A 11/07/2012   Procedure: CYSTOSCOPY;  Surgeon: Malka So, MD;  Location: [facility];  Service: Urology;  Laterality: N/A;  . TUBAL LIGATION  1993    Family History  Problem Relation Age of Onset  . Cancer Mother 76       thyroid, malignant brain tumor  . Arthritis Mother        neck, back  . Hepatitis Sister        Hepatitis C  . Heart disease Sister        stent  . Diabetes Maternal Grandmother   . Arthritis Father        back pain  . Heart disease Father        stent    Social History   Socioeconomic History  . Marital status: Married    Spouse name: Not on file  . Number of children: Not on file  . Years of education: Not on file  . Highest education level: Not on file  Occupational History  . Not on file  Tobacco Use  . Smoking status: Former Smoker    Packs/day: 0.50    Years:  20.00    Pack years: 10.00    Types: Cigarettes    Quit date: 07/12/1992    Years since quitting: 27.2  . Smokeless tobacco: Never Used  Substance and Sexual Activity  . Alcohol use: Yes    Comment: occasionaly  . Drug use: No  . Sexual activity: Yes    Partners: Male  Other Topics Concern  . Not on file  Social History Narrative  . Not on file   Social Determinants of Health   Financial Resource Strain:   . Difficulty of Paying Living Expenses:   Food Insecurity:   . Worried About Charity fundraiser in the Last Year:   . Arboriculturist in the Last Year:   Transportation Needs:   . Film/video editor (Medical):   Marland Kitchen Lack of Transportation (Non-Medical):   Physical Activity:   . Days of Exercise per Week:   . Minutes of Exercise per Session:   Stress:     . Feeling of Stress :   Social Connections:   . Frequency of Communication with Friends and Family:   . Frequency of Social Gatherings with Friends and Family:   . Attends Religious Services:   . Active Member of Clubs or Organizations:   . Attends Archivist Meetings:   Marland Kitchen Marital Status:   Intimate Partner Violence:   . Fear of Current or Ex-Partner:   . Emotionally Abused:   Marland Kitchen Physically Abused:   . Sexually Abused:     Outpatient Medications Prior to Visit  Medication Sig Dispense Refill  . albuterol (VENTOLIN HFA) 108 (90 Base) MCG/ACT inhaler 1-2 puffs q4h prn cough, wheeze, or chest tightness 18 g 3  . baclofen (LIORESAL) 20 MG tablet TAKE 1 TABLET (20 MG) BY MOUTH AT BEDTIME AS NEEDED FOR MUSCLE SPASMS. 90 tablet 3  . BIOTIN PO Take by mouth.    . Calcium Carbonate-Vitamin D (CALTRATE 600+D) 600-400 MG-UNIT per tablet Take 1 tablet by mouth daily.     . Cholecalciferol (VITAMIN D3) 1000 UNITS CAPS Take by mouth.      . diazepam (VALIUM) 5 MG tablet Take 5 mg by mouth every 6 (six) hours as needed for anxiety.    . famotidine (PEPCID) 40 MG tablet Take 1 tablet (40 mg total) by mouth at bedtime. 90 tablet 1  . FLAXSEED, LINSEED, PO Take by mouth every other day.     . hydrOXYzine (ATARAX/VISTARIL) 50 MG tablet Take 50 mg by mouth every 6 (six) hours as needed.    . lamoTRIgine (LAMICTAL) 150 MG tablet Take 150 mg by mouth daily.    . Nutritional Supplements (ESTROVEN PO) Take by mouth.    Marland Kitchen omeprazole (PRILOSEC) 40 MG capsule Take 1 capsule (40 mg total) by mouth daily. 90 capsule 1  . potassium chloride SA (KLOR-CON) 20 MEQ tablet TAKE 1 TABLET (20 MEQ TOTAL) BY MOUTH DAILY. TAKE A 2ND TABLET DIALY AS NEEDED 100 tablet 1  . promethazine (PHENERGAN) 25 MG tablet Take 1 tablet (25 mg total) by mouth every 8 (eight) hours as needed for nausea. 40 tablet 3  . rOPINIRole (REQUIP) 1 MG tablet Take 1 tablet (1 mg total) by mouth daily. 90 tablet 3  . rosuvastatin (CRESTOR)  40 MG tablet Take 1 tablet (40 mg total) by mouth daily. 90 tablet 1  . torsemide (DEMADEX) 20 MG tablet Take 1 tablet (20 mg total) by mouth daily. 30 tablet 2  . fluticasone (FLONASE) 50 MCG/ACT  nasal spray PLACE 2 SPRAYS INTO BOTH NOSTRILS DAILY. (Patient not taking: Reported on 05/30/2018) 16 g 6   No facility-administered medications prior to visit.    Allergies  Allergen Reactions  . Sulfur Diarrhea and Nausea And Vomiting  . Carbamazepine Hives  . Ceclor [Cefaclor]     Tongue swell  . Morphine And Related Itching  . Atorvastatin Other (See Comments)    Leg pain    Review of Systems  Constitutional: Negative for fever and malaise/fatigue.  HENT: Negative for congestion.   Eyes: Negative for blurred vision.  Respiratory: Negative for shortness of breath.   Cardiovascular: Negative for chest pain, palpitations and leg swelling.  Gastrointestinal: Negative for abdominal pain, blood in stool and nausea.  Genitourinary: Negative for dysuria and frequency.  Musculoskeletal: Negative for falls.  Skin: Negative for rash.  Neurological: Negative for dizziness, loss of consciousness and headaches.  Endo/Heme/Allergies: Negative for environmental allergies.  Psychiatric/Behavioral: Negative for depression. The patient is not nervous/anxious.        Objective:    Physical Exam unable to obtain via phone visit.   BP 132/75 (BP Location: Left Arm, Patient Position: Sitting, Cuff Size: Normal)   Pulse 78   Wt 224 lb (101.6 kg)   LMP 10/03/2012   SpO2 95%   BMI 37.28 kg/m  Wt Readings from Last 3 Encounters:  09/28/19 224 lb (101.6 kg)  08/14/19 224 lb (101.6 kg)  04/16/19 221 lb 3.2 oz (100.3 kg)    Diabetic Foot Exam - Simple   No data filed     Lab Results  Component Value Date   WBC 7.1 04/16/2019   HGB 14.5 04/16/2019   HCT 43.9 04/16/2019   PLT 201.0 04/16/2019   GLUCOSE 87 04/16/2019   CHOL 161 04/16/2019   TRIG 214.0 (H) 04/16/2019   HDL 54.90 04/16/2019     LDLDIRECT 79.0 04/16/2019   LDLCALC 117 (H) 04/18/2014   ALT 55 (H) 04/16/2019   AST 35 04/16/2019   NA 143 04/16/2019   K 4.4 04/16/2019   CL 104 04/16/2019   CREATININE 0.84 04/16/2019   BUN 13 04/16/2019   CO2 28 04/16/2019   TSH 1.13 04/16/2019   HGBA1C 5.8 04/16/2019    Lab Results  Component Value Date   TSH 1.13 04/16/2019   Lab Results  Component Value Date   WBC 7.1 04/16/2019   HGB 14.5 04/16/2019   HCT 43.9 04/16/2019   MCV 87.7 04/16/2019   PLT 201.0 04/16/2019   Lab Results  Component Value Date   NA 143 04/16/2019   K 4.4 04/16/2019   CO2 28 04/16/2019   GLUCOSE 87 04/16/2019   BUN 13 04/16/2019   CREATININE 0.84 04/16/2019   BILITOT 0.8 04/16/2019   ALKPHOS 70 04/16/2019   AST 35 04/16/2019   ALT 55 (H) 04/16/2019   PROT 7.2 04/16/2019   ALBUMIN 4.8 04/16/2019   CALCIUM 9.9 04/16/2019   ANIONGAP 13 01/22/2015   GFR 68.87 04/16/2019   Lab Results  Component Value Date   CHOL 161 04/16/2019   Lab Results  Component Value Date   HDL 54.90 04/16/2019   Lab Results  Component Value Date   LDLCALC 117 (H) 04/18/2014   Lab Results  Component Value Date   TRIG 214.0 (H) 04/16/2019   Lab Results  Component Value Date   CHOLHDL 3 04/16/2019   Lab Results  Component Value Date   HGBA1C 5.8 04/16/2019       Assessment &  Plan:   Problem List Items Addressed This Visit    Edema of extremities    Good improvement in pedal edema with the change from Furosemide to Torsemide.       RLS (restless legs syndrome)    Patient is greatly improved with current dose of Requip. Will continue      Myalgia    Good improvement with change in diuretic.       Hyperglycemia    hgba1c acceptable, minimize simple carbs. Increase exercise as tolerated.          I am having Vicki Mallet. Perreira maintain her Calcium Carbonate-Vitamin D, (FLAXSEED, LINSEED, PO), Vitamin D3, BIOTIN PO, Nutritional Supplements (ESTROVEN PO), hydrOXYzine, fluticasone,  lamoTRIgine, albuterol, rOPINIRole, potassium chloride SA, omeprazole, rosuvastatin, baclofen, famotidine, promethazine, diazepam, and torsemide.  No orders of the defined types were placed in this encounter.    I discussed the assessment and treatment plan with the patient. The patient was provided an opportunity to ask questions and all were answered. The patient agreed with the plan and demonstrated an understanding of the instructions.   The patient was advised to call back or seek an in-person evaluation if the symptoms worsen or if the condition fails to improve as anticipated.  I provided 25 minutes of non-face-to-face time during this encounter.   Penni Homans, MD

## 2019-09-28 NOTE — Assessment & Plan Note (Signed)
hgba1c acceptable, minimize simple carbs. Increase exercise as tolerated.  

## 2019-09-28 NOTE — Assessment & Plan Note (Signed)
Good improvement with change in diuretic.

## 2019-09-28 NOTE — Assessment & Plan Note (Signed)
Good improvement in pedal edema with the change from Furosemide to Torsemide.

## 2019-09-28 NOTE — Assessment & Plan Note (Signed)
Patient is greatly improved with current dose of Requip. Will continue

## 2019-10-16 DIAGNOSIS — F3181 Bipolar II disorder: Secondary | ICD-10-CM | POA: Diagnosis not present

## 2019-10-16 DIAGNOSIS — G2581 Restless legs syndrome: Secondary | ICD-10-CM | POA: Diagnosis not present

## 2019-10-31 MED FILL — rOPINIRole HCL 1 MG TABS: 1 | 90 days supply | Qty: 90 | Fill #0

## 2019-10-31 MED FILL — TORSEMIDE 20 MG TABLET: 20 | 30 days supply | Qty: 30 | Fill #1

## 2019-10-31 MED FILL — PROMETHAZINE 25 MG TABLET: 25 | 14 days supply | Qty: 40 | Fill #1

## 2019-12-03 MED FILL — OMEPRAZOLE 40 MG CPDR: 40 | 90 days supply | Qty: 90 | Fill #1

## 2019-12-03 MED FILL — TORSEMIDE 20 MG TABLET: 20 | 30 days supply | Qty: 30 | Fill #2

## 2019-12-03 MED FILL — ROSUVASTATIN CALCIUM 40 MG: 40 | 90 days supply | Qty: 90 | Fill #1

## 2019-12-03 MED FILL — POTASSIUM CHLORIDE CRYS ER: 20 | 50 days supply | Qty: 100 | Fill #1

## 2019-12-03 MED FILL — BACLOFEN 20 MG TABS: 20 | 90 days supply | Qty: 90 | Fill #1

## 2019-12-03 MED FILL — FAMOTIDINE 40 MG TABLET: 40 | 90 days supply | Qty: 90 | Fill #1

## 2019-12-20 ENCOUNTER — Telehealth: Payer: Self-pay | Admitting: Family Medicine

## 2019-12-20 NOTE — Telephone Encounter (Signed)
While it is certainly true I cannot promise her her muscle cramps will not get worse it is unlikely and with her health concerns she is way more at risk from Covid itself than the shot. She should get the shot

## 2019-12-20 NOTE — Telephone Encounter (Signed)
Caller [name]  Call Back # [phone]  Patient would like to speak with provider in regards to getting the Covid Vaccine. Patient states she has some concerns.

## 2019-12-20 NOTE — Telephone Encounter (Signed)
Patient called and she has concerns about getting the covid vaccine.  She has a lot of health issues, RLS, edema in her foot, hyperglycemia, GERD, and muscle cramps.  She does not want her muscle cramps to get worse if she was to get the vaccine.   What are you recommendations?  She she get vaccine.

## 2019-12-20 NOTE — Telephone Encounter (Signed)
Patient would like a call before 12 noon or after 4pm.

## 2019-12-25 NOTE — Telephone Encounter (Signed)
Patient notified

## 2020-02-06 ENCOUNTER — Other Ambulatory Visit: Payer: Medicare Other

## 2020-02-12 ENCOUNTER — Telehealth: Payer: Self-pay | Admitting: Family Medicine

## 2020-02-12 MED ORDER — AZITHROMYCIN 250 MG PO TABS
ORAL_TABLET | ORAL | 0 refills | Status: DC
Start: 2020-02-12 — End: 2020-02-22

## 2020-02-12 MED FILL — AZITHROMYCIN 250 MG TABS: 250 | 5 days supply | Qty: 6 | Fill #0

## 2020-02-12 NOTE — Telephone Encounter (Signed)
My imprivata is not working. OK to send her in a prescription for Azithromycin 250 mg tabs, 2 tabs po once then 1 tab po daily x 4 days.disp #6. Have her come in if no improvement

## 2020-02-12 NOTE — Telephone Encounter (Signed)
Patient notified that rx has been sent in and will follow up if no better.
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
## 2020-02-15 ENCOUNTER — Telehealth: Payer: Medicare Other | Admitting: Family Medicine

## 2020-02-22 ENCOUNTER — Ambulatory Visit (INDEPENDENT_AMBULATORY_CARE_PROVIDER_SITE_OTHER): Payer: Medicare Other | Admitting: Family Medicine

## 2020-02-22 ENCOUNTER — Encounter: Payer: Self-pay | Admitting: Family Medicine

## 2020-02-22 ENCOUNTER — Other Ambulatory Visit: Payer: Self-pay

## 2020-02-22 ENCOUNTER — Other Ambulatory Visit: Payer: Self-pay | Admitting: Family Medicine

## 2020-02-22 VITALS — BP 120/70 | HR 81 | Temp 98.3°F | Resp 18 | Ht 65.0 in | Wt 203.8 lb

## 2020-02-22 DIAGNOSIS — T7411XA Adult physical abuse, confirmed, initial encounter: Secondary | ICD-10-CM | POA: Diagnosis not present

## 2020-02-22 DIAGNOSIS — G4700 Insomnia, unspecified: Secondary | ICD-10-CM | POA: Diagnosis not present

## 2020-02-22 MED ORDER — TRAZODONE HCL 50 MG PO TABS: 25.0000 mg | ORAL_TABLET | Freq: Every evening | ORAL | 3 refills | Status: DC | PRN

## 2020-02-22 MED FILL — ROSUVASTATIN CALCIUM 40 MG: 40 | 90 days supply | Qty: 90 | Fill #1

## 2020-02-22 MED FILL — traZODone HCL 50 MG TABS: 50 | 30 days supply | Qty: 30 | Fill #0
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
## 2020-02-22 NOTE — Patient Instructions (Signed)

## 2020-02-25 ENCOUNTER — Telehealth: Payer: Self-pay
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
## 2020-02-26 NOTE — Telephone Encounter (Signed)
Thank you :)

## 2020-03-03 ENCOUNTER — Other Ambulatory Visit: Payer: Self-pay | Admitting: Family Medicine

## 2020-03-03 MED FILL — BACLOFEN 20 MG TABS: 20 | 90 days supply | Qty: 90 | Fill #2

## 2020-03-03 MED FILL — OMEPRAZOLE 40 MG CPDR: 40 | 90 days supply | Qty: 90 | Fill #0

## 2020-03-03 MED FILL — POTASSIUM CHLORIDE CRYS ER: 20 | 50 days supply | Qty: 100 | Fill #0

## 2020-03-03 MED FILL — TORSEMIDE 20 MG TABLET: 20 | 30 days supply | Qty: 30 | Fill #0

## 2020-03-03 MED FILL — FAMOTIDINE 40 MG TABLET: 40 | 90 days supply | Qty: 90 | Fill #0

## 2020-03-07 ENCOUNTER — Telehealth (INDEPENDENT_AMBULATORY_CARE_PROVIDER_SITE_OTHER): Payer: Medicare Other | Admitting: Family Medicine

## 2020-03-07 ENCOUNTER — Other Ambulatory Visit: Payer: Self-pay

## 2020-03-07 VITALS — BP 109/67 | HR 87 | Ht 65.0 in | Wt 195.0 lb

## 2020-03-07 DIAGNOSIS — R739 Hyperglycemia, unspecified: Secondary | ICD-10-CM | POA: Diagnosis not present

## 2020-03-07 DIAGNOSIS — F418 Other specified anxiety disorders: Secondary | ICD-10-CM | POA: Diagnosis not present

## 2020-03-07 MED ORDER — DOXYCYCLINE HYCLATE 100 MG PO TABS: 100.0000 mg | ORAL_TABLET | Freq: Two times a day (BID) | ORAL | 0 refills | Status: DC

## 2020-03-07 MED FILL — DOXYCYCLINE HYCLATE 100 MG: 100 | 10 days supply | Qty: 20 | Fill #0

## 2020-03-07 NOTE — Assessment & Plan Note (Signed)
hgba1c acceptable, minimize simple carbs. Increase exercise as tolerated.  

## 2020-03-10 ENCOUNTER — Encounter: Payer: Medicare Other | Admitting: Family Medicine
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
## 2020-03-13 ENCOUNTER — Other Ambulatory Visit: Payer: Self-pay | Admitting: Family Medicine

## 2020-03-13 ENCOUNTER — Telehealth: Payer: Self-pay | Admitting: Family Medicine

## 2020-03-13 MED ORDER — BENZONATATE 100 MG PO CAPS: 100.0000 mg | ORAL_CAPSULE | Freq: Three times a day (TID) | ORAL | 0 refills | Status: DC | PRN

## 2020-03-13 MED ORDER — AMOXICILLIN 500 MG PO CAPS
500.0000 mg | ORAL_CAPSULE | Freq: Three times a day (TID) | ORAL | 0 refills | Status: DC
Start: 2020-03-13 — End: 2020-08-12

## 2020-03-13 NOTE — Telephone Encounter (Signed)
Would you like for me to schedule pt a virtual with someone in the office or will you just go ahead and send in meds?

## 2020-03-13 NOTE — Telephone Encounter (Signed)
CallerRilei Kravitz  Call Back # [phone]  Patient states she is having coughing and can not quit. Please send something in to pharmacy.

## 2020-03-13 NOTE — Telephone Encounter (Signed)
I have sent in Amoxicillin 500 mg tid and Tessalon Perles 100 mg tid prn cough. If she does not improve she will need to be seen

## 2020-03-14 ENCOUNTER — Telehealth: Payer: Self-pay | Admitting: *Deleted

## 2020-03-14 MED FILL — AMOXICILLIN 500 MG CAPSULE: 500 | 10 days supply | Qty: 30 | Fill #0

## 2020-03-14 MED FILL — BENZONATATE 100 MG CAPS: 100 | 14 days supply | Qty: 40 | Fill #0

## 2020-03-14 NOTE — Telephone Encounter (Signed)
Unable to leave voicemail message, due to not having one set up.

## 2020-03-14 NOTE — Telephone Encounter (Signed)
No answer no vm.  Needs follow up appointment for 3 months

## 2020-03-18 NOTE — Telephone Encounter (Signed)
Spoken w/patient and prescriptions has been picked up about 1 week ago, pt. States, and appreciative and noticed great changes for the better, and breaking up and getting coughing relief as well.  Pt. States much appreciation.  [place]

## 2020-03-20 MED FILL — traZODone HCL 50 MG TABS: 50 | 30 days supply | Qty: 30 | Fill #1

## 2020-04-14 ENCOUNTER — Ambulatory Visit (INDEPENDENT_AMBULATORY_CARE_PROVIDER_SITE_OTHER): Payer: Medicare Other | Admitting: Psychologist

## 2020-04-14 DIAGNOSIS — F4310 Post-traumatic stress disorder, unspecified: Secondary | ICD-10-CM

## 2020-04-18 MED FILL — traZODone HCL 50 MG TABS: 50 | 30 days supply | Qty: 30 | Fill #2

## 2020-04-18 MED FILL — PROMETHAZINE 25 MG TABLET: 25 | 14 days supply | Qty: 40 | Fill #2

## 2020-04-22 ENCOUNTER — Ambulatory Visit: Payer: Medicare Other | Attending: Internal Medicine

## 2020-04-22 ENCOUNTER — Ambulatory Visit: Payer: Medicare Other

## 2020-04-22 ENCOUNTER — Other Ambulatory Visit: Payer: Self-pay

## 2020-04-22 ENCOUNTER — Ambulatory Visit (INDEPENDENT_AMBULATORY_CARE_PROVIDER_SITE_OTHER): Payer: Medicare Other | Admitting: Medical

## 2020-04-22 ENCOUNTER — Other Ambulatory Visit (HOSPITAL_BASED_OUTPATIENT_CLINIC_OR_DEPARTMENT_OTHER): Payer: Self-pay | Admitting: Internal Medicine

## 2020-04-22 VITALS — BP 108/62 | HR 73 | Temp 98.4°F | Resp 18 | Ht 64.0 in | Wt 200.0 lb

## 2020-04-22 DIAGNOSIS — F439 Reaction to severe stress, unspecified: Secondary | ICD-10-CM

## 2020-04-22 DIAGNOSIS — M545 Low back pain, unspecified: Secondary | ICD-10-CM

## 2020-04-22 DIAGNOSIS — Z23 Encounter for immunization: Secondary | ICD-10-CM

## 2020-04-22 DIAGNOSIS — Z7185 Encounter for immunization safety counseling: Secondary | ICD-10-CM

## 2020-04-22 MED ORDER — DICLOFENAC SODIUM 75 MG PO TBEC: 75.0000 mg | DELAYED_RELEASE_TABLET | Freq: Two times a day (BID) | ORAL | 0 refills | Status: DC

## 2020-04-22 MED FILL — DICLOFENAC SODIUM 75 MG TAB: 75 | 10 days supply | Qty: 20 | Fill #0

## 2020-04-22 NOTE — Patient Instructions (Addendum)
For low back pain I want you to try diclofenac. Stop advil and tylenol presently. Can continue baclofen.  If pain persists would recommend xray. You declined that today. But will order if you update me that pain persists into next week.  Also taper dose medrol option if diclofenac not adequate.   For recent stress continue to take meds for depression, anxiety and see continue to follow up with counselor.  Follow up 2 weeks or as needed   Can do back exercises as tolerated. When pain is easing up.    Back Exercises These exercises help to make your trunk and back strong. They also help to keep the lower back flexible. Doing these exercises can help to prevent back pain or lessen existing pain.  If you have back pain, try to do these exercises 2-3 times each day or as told by your doctor.  As you get better, do the exercises once each day. Repeat the exercises more often as told by your doctor.  To stop back pain from coming back, do the exercises once each day, or as told by your doctor. Exercises Single knee to chest Do these steps 3-5 times in a row for each leg: 1. Lie on your back on a firm bed or the floor with your legs stretched out. 2. Bring one knee to your chest. 3. Grab your knee or thigh with both hands and hold them it in place. 4. Pull on your knee until you feel a gentle stretch in your lower back or buttocks. 5. Keep doing the stretch for 10-30 seconds. 6. Slowly let go of your leg and straighten it. Pelvic tilt Do these steps 5-10 times in a row: 1. Lie on your back on a firm bed or the floor with your legs stretched out. 2. Bend your knees so they point up to the ceiling. Your feet should be flat on the floor. 3. Tighten your lower belly (abdomen) muscles to press your lower back against the floor. This will make your tailbone point up to the ceiling instead of pointing down to your feet or the floor. 4. Stay in this position for 5-10 seconds while you gently  tighten your muscles and breathe evenly. Cat-cow Do these steps until your lower back bends more easily: 1. Get on your hands and knees on a firm surface. Keep your hands under your shoulders, and keep your knees under your hips. You may put padding under your knees. 2. Let your head hang down toward your chest. Tighten (contract) the muscles in your belly. Point your tailbone toward the floor so your lower back becomes rounded like the back of a cat. 3. Stay in this position for 5 seconds. 4. Slowly lift your head. Let the muscles of your belly relax. Point your tailbone up toward the ceiling so your back forms a sagging arch like the back of a cow. 5. Stay in this position for 5 seconds.  Press-ups Do these steps 5-10 times in a row: 1. Lie on your belly (face-down) on the floor. 2. Place your hands near your head, about shoulder-width apart. 3. While you keep your back relaxed and keep your hips on the floor, slowly straighten your arms to raise the top half of your body and lift your shoulders. Do not use your back muscles. You may change where you place your hands in order to make yourself more comfortable. 4. Stay in this position for 5 seconds. 5. Slowly return to lying flat on  the floor.  Bridges Do these steps 10 times in a row: 1. Lie on your back on a firm surface. 2. Bend your knees so they point up to the ceiling. Your feet should be flat on the floor. Your arms should be flat at your sides, next to your body. 3. Tighten your butt muscles and lift your butt off the floor until your waist is almost as high as your knees. If you do not feel the muscles working in your butt and the back of your thighs, slide your feet 1-2 inches farther away from your butt. 4. Stay in this position for 3-5 seconds. 5. Slowly lower your butt to the floor, and let your butt muscles relax. If this exercise is too easy, try doing it with your arms crossed over your chest. Belly crunches Do these steps  5-10 times in a row: 1. Lie on your back on a firm bed or the floor with your legs stretched out. 2. Bend your knees so they point up to the ceiling. Your feet should be flat on the floor. 3. Cross your arms over your chest. 4. Tip your chin a little bit toward your chest but do not bend your neck. 5. Tighten your belly muscles and slowly raise your chest just enough to lift your shoulder blades a tiny bit off of the floor. Avoid raising your body higher than that, because it can put too much stress on your low back. 6. Slowly lower your chest and your head to the floor. Back lifts Do these steps 5-10 times in a row: 1. Lie on your belly (face-down) with your arms at your sides, and rest your forehead on the floor. 2. Tighten the muscles in your legs and your butt. 3. Slowly lift your chest off of the floor while you keep your hips on the floor. Keep the back of your head in line with the curve in your back. Look at the floor while you do this. 4. Stay in this position for 3-5 seconds. 5. Slowly lower your chest and your face to the floor. Contact a doctor if:  Your back pain gets a lot worse when you do an exercise.  Your back pain does not get better 2 hours after you exercise. If you have any of these problems, stop doing the exercises. Do not do them again unless your doctor says it is okay. Get help right away if:  You have sudden, very bad back pain. If this happens, stop doing the exercises. Do not do them again unless your doctor says it is okay. This information is not intended to replace advice given to you by your health care provider. Make sure you discuss any questions you have with your health care provider. Document Revised: 03/23/2018 Document Reviewed: 03/23/2018 Elsevier Patient Education  2020 [employer].

## 2020-04-22 NOTE — Progress Notes (Signed)
Subjective:    Patient ID: [name], female    DOB: 18-Nov-1955, 64 y.o.   MRN: [id number]  HPI  Pt I for some low back pain recently. She states pain occurred after picking up large box of water 3 times in one day. She then move a tv case. Around this time is when lower back and left side sciatica. Pain started about 7 days. Pain not improving much. Pt taking advil and tylenol. Pain is persisting.   Pain seems worse early in morning.  Hx of back surgery lumbar area years ago.    2015 mri below.     IMPRESSION: 1. Stable postoperative and degenerative changes at L5-S1 with mild right greater than left neural foraminal narrowing. No recurrent disc herniation or spinal stenosis. 2. Mild facet arthrosis elsewhere without stenosis.  Also L5-S1: Prior left hemilaminectomy is again noted. Disc space height loss, circumferential disc bulging, and endplate spurring result in mild right greater than left neural foraminal narrowing, unchanged. No spinal canal stenosis. Mild epidural fibrosis is again noted about the left S1 nerve root.  Review of Systems  Constitutional: Negative for chills, fatigue and fever.  Respiratory: Negative for cough, chest tightness, shortness of breath and wheezing.   Cardiovascular: Negative for chest pain and palpitations.  Gastrointestinal: Negative for abdominal pain.  Musculoskeletal: Positive for back pain. Negative for neck pain and neck stiffness.  Skin: Negative for rash.  Neurological: Negative for dizziness, speech difficulty, weakness, numbness and headaches.  Hematological: Negative for adenopathy. Does not bruise/bleed easily.  Psychiatric/Behavioral: Negative for behavioral problems and decreased concentration.   Past Medical History:  Diagnosis Date  . Acute sinusitis 03/27/2013  . Allergy    seasonal  . Anxiety   . Arthritis 06/09/2013  . Depression   . Dyspareunia   . Edema extremities   . Encounter for preventative adult  health care exam with abnormal findings 04/21/2014  . GERD (gastroesophageal reflux disease)   . Hip pain, left 11/01/2011  . History of proteinuria syndrome   . Hyperlipidemia   . Insomnia 03/11/2013  . Low back pain 05/09/2011  . Lymphadenitis 06/16/2012  . Myalgia and myositis 02/15/2015  . Obese   . Onychomycosis 05/09/2011  . Oral lesion 08/26/2013  . Otitis externa 06/23/2012   left  . Perimenopause 03/11/2013  . RLS (restless legs syndrome)   . RUQ pain 09/20/2011  . Sleep apnea 04/21/2014  . Tinea corporis 04/26/2017  . Unspecified constipation 05/13/2013  . Urinary frequency 08/26/2013     Social History   Socioeconomic History  . Marital status: Married    Spouse name: Not on file  . Number of children: Not on file  . Years of education: Not on file  . Highest education level: Not on file  Occupational History  . Not on file  Tobacco Use  . Smoking status: Former Smoker    Packs/day: 0.50    Years: 20.00    Pack years: 10.00    Types: Cigarettes    Quit date: 07/12/1992    Years since quitting: 27.7  . Smokeless tobacco: Never Used  Substance and Sexual Activity  . Alcohol use: Yes    Comment: occasionaly  . Drug use: No  . Sexual activity: Yes    Partners: Male  Other Topics Concern  . Not on file  Social History Narrative  . Not on file   Social Determinants of Health   Financial Resource Strain:   . Difficulty of Paying Living  Expenses: Not on file  Food Insecurity:   . Worried About Charity fundraiser in the Last Year: Not on file  . Ran Out of Food in the Last Year: Not on file  Transportation Needs:   . Lack of Transportation (Medical): Not on file  . Lack of Transportation (Non-Medical): Not on file  Physical Activity:   . Days of Exercise per Week: Not on file  . Minutes of Exercise per Session: Not on file  Stress:   . Feeling of Stress : Not on file  Social Connections:   . Frequency of Communication with Friends and Family: Not on file    . Frequency of Social Gatherings with Friends and Family: Not on file  . Attends Religious Services: Not on file  . Active Member of Clubs or Organizations: Not on file  . Attends Archivist Meetings: Not on file  . Marital Status: Not on file  Intimate Partner Violence:   . Fear of Current or Ex-Partner: Not on file  . Emotionally Abused: Not on file  . Physically Abused: Not on file  . Sexually Abused: Not on file    Past Surgical History:  Procedure Laterality Date  . BACK SURGERY  2006   low, discectomy for herniated disc  . BLADDER SUSPENSION N/A 11/07/2012   Procedure: [facility];  Surgeon: Malka So, MD;  Location: [facility];  Service: Urology;  Laterality: N/A;  . CARPAL TUNNEL RELEASE Right 2006  . CESAREAN SECTION  1993  . CYSTOSCOPY N/A 11/07/2012   Procedure: CYSTOSCOPY;  Surgeon: Malka So, MD;  Location: [facility];  Service: Urology;  Laterality: N/A;  . TUBAL LIGATION  1993    Family History  Problem Relation Age of Onset  . Cancer Mother 9       thyroid, malignant brain tumor  . Arthritis Mother        neck, back  . Hepatitis Sister        Hepatitis C  . Heart disease Sister        stent  . Diabetes Maternal Grandmother   . Arthritis Father        back pain  . Heart disease Father        stent    Allergies  Allergen Reactions  . Sulfur Diarrhea and Nausea And Vomiting  . Carbamazepine Hives  . Ceclor [Cefaclor]     Tongue swell  . Morphine And Related Itching  . Atorvastatin Other (See Comments)    Leg pain    Current Outpatient Medications on File Prior to Visit  Medication Sig Dispense Refill  . albuterol (VENTOLIN HFA) 108 (90 Base) MCG/ACT inhaler 1-2 puffs q4h prn cough, wheeze, or chest tightness 18 g 3  . baclofen (LIORESAL) 20 MG tablet TAKE 1 TABLET (20 MG) BY MOUTH AT BEDTIME AS NEEDED FOR MUSCLE SPASMS. 90 tablet 3  . benzonatate (TESSALON PERLES) 100 MG capsule Take 1 capsule (100  mg total) by mouth 3 (three) times daily as needed for cough. 40 capsule 0  . BIOTIN PO Take by mouth.    . Calcium Carbonate-Vitamin D (CALTRATE 600+D) 600-400 MG-UNIT per tablet Take 1 tablet by mouth daily.     . Cholecalciferol (VITAMIN D3) 1000 UNITS CAPS Take by mouth.      . diazepam (VALIUM) 5 MG tablet Take 5 mg by mouth every 6 (six) hours as needed for anxiety.    Marland Kitchen doxycycline (VIBRA-TABS) 100 MG  tablet Take 1 tablet (100 mg total) by mouth 2 (two) times daily. 20 tablet 0  . famotidine (PEPCID) 40 MG tablet TAKE 1 TABLET (40 MG TOTAL) BY MOUTH AT BEDTIME. 90 tablet 1  . FLAXSEED, LINSEED, PO Take by mouth every other day.     . fluticasone (FLONASE) 50 MCG/ACT nasal spray PLACE 2 SPRAYS INTO BOTH NOSTRILS DAILY. 16 g 6  . hydrOXYzine (ATARAX/VISTARIL) 50 MG tablet Take 50 mg by mouth every 6 (six) hours as needed.    . lamoTRIgine (LAMICTAL) 150 MG tablet Take 100 mg by mouth daily.     . Nutritional Supplements (ESTROVEN PO) Take by mouth.    Marland Kitchen omeprazole (PRILOSEC) 40 MG capsule TAKE 1 CAPSULE (40 MG TOTAL) BY MOUTH DAILY. 90 capsule 1  . potassium chloride SA (KLOR-CON) 20 MEQ tablet TAKE 1 TABLET (20 MEQ TOTAL) BY MOUTH DAILY. TAKE A 2ND TABLET DIALY AS NEEDED 100 tablet 1  . promethazine (PHENERGAN) 25 MG tablet Take 1 tablet (25 mg total) by mouth every 8 (eight) hours as needed for nausea. 40 tablet 3  . rOPINIRole (REQUIP) 1 MG tablet Take 1 tablet (1 mg total) by mouth daily. 90 tablet 3  . rosuvastatin (CRESTOR) 40 MG tablet TAKE 1 TABLET (40 MG TOTAL) BY MOUTH DAILY. 90 tablet 1  . torsemide (DEMADEX) 20 MG tablet TAKE 1 TABLET (20 MG TOTAL) BY MOUTH DAILY. 30 tablet 2  . traZODone (DESYREL) 50 MG tablet Take 0.5-1 tablets (25-50 mg total) by mouth at bedtime as needed for sleep. 30 tablet 3  . amoxicillin (AMOXIL) 500 MG capsule Take 1 capsule (500 mg total) by mouth 3 (three) times daily. (Patient not taking: Reported on 04/22/2020) 30 capsule 0   No current  facility-administered medications on file prior to visit.    BP 108/62   Pulse 73   Temp 98.4 F (36.9 C) (Oral)   Resp 18   Ht 5\' 4"  (1.626 m)   Wt 200 lb (90.7 kg)   LMP 10/03/2012   SpO2 100%   BMI 34.33 kg/m       Objective:   Physical Exam  General Appearance- Not in acute distress.    Chest and Lung Exam Auscultation: Breath sounds:-Normal. Clear even and unlabored. Adventitious sounds:- No Adventitious sounds.  Cardiovascular Auscultation:Rythm - Regular, rate and rythm. Heart Sounds -Normal heart sounds.  Abdomen Inspection:-Inspection Normal.  Palpation/Perucssion: Palpation and Percussion of the abdomen reveal- Non Tender, No Rebound tenderness, No rigidity(Guarding) and No Palpable abdominal masses.  Liver:-Normal.  Spleen:- Normal.   Back Mid lumbar spine tenderness to palpation and left si area tender. Pain on straight leg lift. Pain on lateral movements and flexion/extension of the spine.  Lower ext neurologic  L5-S1 sensation intact bilaterally. Normal patellar reflexes bilaterally. No foot drop bilaterally.      Assessment & Plan:  For low back pain I want you to try diclofenac. Stop advil and tylenol presently. Can continue baclofen.  If pain persists would recommend xray. You declined that today. But will order if you update me that pain persists into next week.  Also taper dose medrol option if diclofenac not adequate.   For recent stress continue to take meds for depression, anxiety and see continue to follow up with counselor.  Follow up 2 weeks or as needed  Can do back exercises as tolerated. When pain is easing up.

## 2020-04-24 ENCOUNTER — Ambulatory Visit (INDEPENDENT_AMBULATORY_CARE_PROVIDER_SITE_OTHER): Payer: Medicare Other | Admitting: Psychologist

## 2020-04-24 DIAGNOSIS — F4310 Post-traumatic stress disorder, unspecified: Secondary | ICD-10-CM

## 2020-04-30 MED FILL — PFIZER-BIONTECH COVID-19 VA: 30 | 1 days supply | Qty: 0 | Fill #0

## 2020-05-01 ENCOUNTER — Ambulatory Visit (INDEPENDENT_AMBULATORY_CARE_PROVIDER_SITE_OTHER): Payer: Medicare Other | Admitting: Psychologist

## 2020-05-01 DIAGNOSIS — F4310 Post-traumatic stress disorder, unspecified: Secondary | ICD-10-CM | POA: Diagnosis not present

## 2020-05-12 ENCOUNTER — Ambulatory Visit (INDEPENDENT_AMBULATORY_CARE_PROVIDER_SITE_OTHER): Payer: Medicare Other | Admitting: Psychologist

## 2020-05-12 DIAGNOSIS — F4310 Post-traumatic stress disorder, unspecified: Secondary | ICD-10-CM | POA: Diagnosis not present

## 2020-05-13 ENCOUNTER — Ambulatory Visit: Payer: Medicare Other | Attending: Internal Medicine

## 2020-05-13 ENCOUNTER — Ambulatory Visit (HOSPITAL_COMMUNITY): Payer: Self-pay | Admitting: Licensed Clinical Social Worker

## 2020-05-13 ENCOUNTER — Other Ambulatory Visit (HOSPITAL_BASED_OUTPATIENT_CLINIC_OR_DEPARTMENT_OTHER): Payer: Self-pay | Admitting: Internal Medicine

## 2020-05-13 DIAGNOSIS — Z23 Encounter for immunization: Secondary | ICD-10-CM

## 2020-05-13 NOTE — Progress Notes (Signed)
   Covid-19 Vaccination Clinic  Name:  [name]    MRN: [id number] DOB: 1955-08-19  05/13/2020  [name] was observed post Covid-19 immunization for 15 minutes without incident. She was provided with Vaccine Information Sheet and instruction to access the V-Safe system.   [name] was instructed to call 911 with any severe reactions post vaccine: Marland Kitchen Difficulty breathing  . Swelling of face and throat  . A fast heartbeat  . A bad rash all over body  . Dizziness and weakness   Immunizations Administered    Name Date Dose VIS Date Route   Pfizer COVID-19 Vaccine 05/13/2020 12:11 PM 0.3 mL 04/30/2020 Intramuscular   Manufacturer: [place]   Lot: [id number]   [place]: [address]-4

## 2020-05-20 MED FILL — traZODone HCL 50 MG TABS: 50 | 30 days supply | Qty: 30 | Fill #3

## 2020-05-22 MED FILL — PFIZER-BIONTECH COVID-19 VA: 30 | 1 days supply | Qty: 0 | Fill #0

## 2020-05-23 ENCOUNTER — Ambulatory Visit (INDEPENDENT_AMBULATORY_CARE_PROVIDER_SITE_OTHER): Payer: Medicare Other | Admitting: Psychologist

## 2020-05-23 DIAGNOSIS — F4310 Post-traumatic stress disorder, unspecified: Secondary | ICD-10-CM | POA: Diagnosis not present

## 2020-05-26 MED FILL — ROSUVASTATIN CALCIUM 40 MG: 40 | 90 days supply | Qty: 90 | Fill #0

## 2020-06-09 MED FILL — BACLOFEN 20 MG TABS: 20 | 90 days supply | Qty: 90 | Fill #3

## 2020-06-17 DIAGNOSIS — F3181 Bipolar II disorder: Secondary | ICD-10-CM | POA: Diagnosis not present

## 2020-06-23 ENCOUNTER — Other Ambulatory Visit: Payer: Self-pay | Admitting: Family Medicine

## 2020-06-23 DIAGNOSIS — G4700 Insomnia, unspecified: Secondary | ICD-10-CM

## 2020-06-23 MED FILL — traZODone HCL 50 MG TABS: 50 | 90 days supply | Qty: 90 | Fill #0

## 2020-06-24 ENCOUNTER — Encounter: Payer: Self-pay | Admitting: Family Medicine

## 2020-06-24 ENCOUNTER — Other Ambulatory Visit: Payer: Self-pay

## 2020-06-24 ENCOUNTER — Other Ambulatory Visit: Payer: Self-pay | Admitting: Family Medicine

## 2020-06-24 ENCOUNTER — Ambulatory Visit (INDEPENDENT_AMBULATORY_CARE_PROVIDER_SITE_OTHER): Payer: Medicare Other | Admitting: Family Medicine

## 2020-06-24 VITALS — BP 106/64 | HR 79 | Temp 98.1°F | Resp 16 | Wt 201.4 lb

## 2020-06-24 DIAGNOSIS — R252 Cramp and spasm: Secondary | ICD-10-CM

## 2020-06-24 DIAGNOSIS — R1011 Right upper quadrant pain: Secondary | ICD-10-CM

## 2020-06-24 DIAGNOSIS — E782 Mixed hyperlipidemia: Secondary | ICD-10-CM

## 2020-06-24 DIAGNOSIS — R922 Inconclusive mammogram: Secondary | ICD-10-CM | POA: Insufficient documentation

## 2020-06-24 DIAGNOSIS — G2581 Restless legs syndrome: Secondary | ICD-10-CM

## 2020-06-24 DIAGNOSIS — Z23 Encounter for immunization: Secondary | ICD-10-CM

## 2020-06-24 DIAGNOSIS — Z1239 Encounter for other screening for malignant neoplasm of breast: Secondary | ICD-10-CM

## 2020-06-24 DIAGNOSIS — Z9189 Other specified personal risk factors, not elsewhere classified: Secondary | ICD-10-CM

## 2020-06-24 DIAGNOSIS — G4700 Insomnia, unspecified: Secondary | ICD-10-CM

## 2020-06-24 DIAGNOSIS — M791 Myalgia, unspecified site: Secondary | ICD-10-CM

## 2020-06-24 DIAGNOSIS — R739 Hyperglycemia, unspecified: Secondary | ICD-10-CM | POA: Diagnosis not present

## 2020-06-24 DIAGNOSIS — N6019 Diffuse cystic mastopathy of unspecified breast: Secondary | ICD-10-CM

## 2020-06-24 DIAGNOSIS — F418 Other specified anxiety disorders: Secondary | ICD-10-CM | POA: Diagnosis not present

## 2020-06-24 DIAGNOSIS — R9230 Dense breasts, unspecified: Secondary | ICD-10-CM

## 2020-06-24 LAB — LIPID PANEL
Cholesterol: 160 mg/dL (ref 0–200)
HDL: 54 mg/dL (ref >39.00)
LDL Cholesterol: 69 mg/dL (ref 0–99)
NonHDL: 106
Total CHOL/HDL Ratio: 3
Triglycerides: 186 mg/dL — ABNORMAL HIGH (ref 0.0–149.0)
VLDL: 37.2 mg/dL (ref 0.0–40.0)

## 2020-06-24 LAB — COMPREHENSIVE METABOLIC PANEL
ALT: 20 U/L (ref 0–35)
AST: 19 U/L (ref 0–37)
Albumin: 4.5 g/dL (ref 3.5–5.2)
Alkaline Phosphatase: 56 U/L (ref 39–117)
BUN: 11 mg/dL (ref 6–23)
CO2: 26 mEq/L (ref 19–32)
Calcium: 9.7 mg/dL (ref 8.4–10.5)
Chloride: 104 mEq/L (ref 96–112)
Creatinine, Ser: 0.84 mg/dL (ref 0.40–1.20)
GFR: 74.45 mL/min (ref >60.00)
Glucose, Bld: 84 mg/dL (ref 70–99)
Potassium: 4.3 mEq/L (ref 3.5–5.1)
Sodium: 140 mEq/L (ref 135–145)
Total Bilirubin: 0.7 mg/dL (ref 0.2–1.2)
Total Protein: 7 g/dL (ref 6.0–8.3)

## 2020-06-24 LAB — CBC
HCT: 43.2 % (ref 36.0–46.0)
Hemoglobin: 14.3 g/dL (ref 12.0–15.0)
MCHC: 33 g/dL (ref 30.0–36.0)
MCV: 87.9 fl (ref 78.0–100.0)
Platelets: 230 10*3/uL (ref 150.0–400.0)
RBC: 4.92 Mil/uL (ref 3.87–5.11)
RDW: 13.5 % (ref 11.5–15.5)
WBC: 6.7 10*3/uL (ref 4.0–10.5)

## 2020-06-24 LAB — HEMOGLOBIN A1C: Hgb A1c MFr Bld: 5.3 % (ref 4.6–6.5)

## 2020-06-24 LAB — MAGNESIUM: Magnesium: 2.1 mg/dL (ref 1.5–2.5)

## 2020-06-24 LAB — TSH: TSH: 0.78 u[IU]/mL (ref 0.35–4.50)

## 2020-06-24 MED ORDER — PRAMIPEXOLE DIHYDROCHLORIDE 0.25 MG PO TABS: 0.2500 mg | ORAL_TABLET | Freq: Every day | ORAL | 2 refills | Status: DC

## 2020-06-24 MED ORDER — PRAMIPEXOLE DIHYDROCHLORIDE 0.25 MG PO TABS: 0.2500 mg | ORAL_TABLET | Freq: Three times a day (TID) | ORAL | 2 refills | Status: DC

## 2020-06-24 MED FILL — PRAMIPEXOLE 0.25 MG TABLET: 0.25 | 30 days supply | Qty: 60 | Fill #0

## 2020-06-24 NOTE — Patient Instructions (Addendum)
Shingrix is the new shingles shot 2 shots over 2-6 months at the pharmacy   Restless Legs Syndrome Restless legs syndrome is a condition that causes uncomfortable feelings or sensations in the legs, especially while sitting or lying down. The sensations usually cause an overwhelming urge to move the legs. The arms can also sometimes be affected. The condition can range from mild to severe. The symptoms often interfere with a person's ability to sleep. What are the causes? The cause of this condition is not known. What increases the risk? The following factors may make you more likely to develop this condition:  Being older than 50.  Pregnancy.  Being a woman. In general, the condition is more common in women than in men.  A family history of the condition.  Having iron deficiency.  Overuse of caffeine, nicotine, or alcohol.  Certain medical conditions, such as kidney disease, Parkinson's disease, or nerve damage.  Certain medicines, such as those for high blood pressure, nausea, colds, allergies, depression, and some heart conditions. What are the signs or symptoms? The main symptom of this condition is uncomfortable sensations in the legs, such as:  Pulling.  Tingling.  Prickling.  Throbbing.  Crawling.  Burning. Usually, the sensations:  Affect both sides of the body.  Are worse when you sit or lie down.  Are worse at night. These may wake you up or make it difficult to fall asleep.  Make you have a strong urge to move your legs.  Are temporarily relieved by moving your legs. The arms can also be affected, but this is rare. People who have this condition often have tiredness during the day because of their lack of sleep at night. How is this diagnosed? This condition may be diagnosed based on:  Your symptoms.  Blood tests. In some cases, you may be monitored in a sleep lab by a specialist (a sleep study). This can detect any disruptions in your sleep. How  is this treated? This condition is treated by managing the symptoms. This may include:  Lifestyle changes, such as exercising, using relaxation techniques, and avoiding caffeine, alcohol, or tobacco.  Medicines. Anti-seizure medicines may be tried first. Follow these instructions at home:     General instructions  Take over-the-counter and prescription medicines only as told by your health care provider.  Use methods to help relieve the uncomfortable sensations, such as: ? Massaging your legs. ? Walking or stretching. ? Taking a cold or hot bath.  Keep all follow-up visits as told by your health care provider. This is important. Lifestyle  Practice good sleep habits. For example, go to bed and get up at the same time every day. Most adults should get 7-9 hours of sleep each night.  Exercise regularly. Try to get at least 30 minutes of exercise most days of the week.  Practice ways of relaxing, such as yoga or meditation.  Avoid caffeine and alcohol.  Do not use any products that contain nicotine or tobacco, such as cigarettes and e-cigarettes. If you need help quitting, ask your health care provider. Contact a health care provider if:  Your symptoms get worse or they do not improve with treatment. Summary  Restless legs syndrome is a condition that causes uncomfortable feelings or sensations in the legs, especially while sitting or lying down.  The symptoms often interfere with a person's ability to sleep.  This condition is treated by managing the symptoms. You may need to make lifestyle changes or take medicines. This information  is not intended to replace advice given to you by your health care provider. Make sure you discuss any questions you have with your health care provider. Document Revised: 07/18/2017 Document Reviewed: 07/18/2017 Elsevier Patient Education  [place].

## 2020-06-25 NOTE — Assessment & Plan Note (Signed)
Hydrate well and stay active will check labs

## 2020-06-25 NOTE — Assessment & Plan Note (Signed)
hgba1c acceptable, minimize simple carbs. Increase exercise as tolerated.  

## 2020-06-25 NOTE — Assessment & Plan Note (Signed)
Encouraged good sleep hygiene such as dark, quiet room. No blue/green glowing lights such as computer screens in bedroom. No alcohol or stimulants in evening. Cut down on caffeine as able. Regular exercise is helpful but not just prior to bed time.  

## 2020-06-25 NOTE — Assessment & Plan Note (Signed)
Encouraged heart healthy diet, increase exercise, avoid trans fats, consider a krill oil cap daily. Tolerating Rosuvastatin 

## 2020-06-25 NOTE — Progress Notes (Signed)
Subjective:    Patient ID: [name], female    DOB: 1956-04-25, 64 y.o.   MRN: [id number]  Chief Complaint  Patient presents with  . Follow-up    HPI Patient is in today for follow up on chronic medical concerns. No recent febrile illness or hospitalizations. She is very stressed as she is working on getting a divorce. He is keeping his distance at the current time.   Past Medical History:  Diagnosis Date  . Acute sinusitis 03/27/2013  . Allergy    seasonal  . Anxiety   . Arthritis 06/09/2013  . Depression   . Dyspareunia   . Edema extremities   . Encounter for preventative adult health care exam with abnormal findings 04/21/2014  . GERD (gastroesophageal reflux disease)   . Hip pain, left 11/01/2011  . History of proteinuria syndrome   . Hyperlipidemia   . Insomnia 03/11/2013  . Low back pain 05/09/2011  . Lymphadenitis 06/16/2012  . Myalgia and myositis 02/15/2015  . Obese   . Onychomycosis 05/09/2011  . Oral lesion 08/26/2013  . Otitis externa 06/23/2012   left  . Perimenopause 03/11/2013  . RLS (restless legs syndrome)   . RUQ pain 09/20/2011  . Sleep apnea 04/21/2014  . Tinea corporis 04/26/2017  . Unspecified constipation 05/13/2013  . Urinary frequency 08/26/2013    Past Surgical History:  Procedure Laterality Date  . BACK SURGERY  2006   low, discectomy for herniated disc  . BLADDER SUSPENSION N/A 11/07/2012   Procedure: [facility];  Surgeon: Malka So, MD;  Location: [facility];  Service: Urology;  Laterality: N/A;  . CARPAL TUNNEL RELEASE Right 2006  . CESAREAN SECTION  1993  . CYSTOSCOPY N/A 11/07/2012   Procedure: CYSTOSCOPY;  Surgeon: Malka So, MD;  Location: [facility];  Service: Urology;  Laterality: N/A;  . TUBAL LIGATION  1993    Family History  Problem Relation Age of Onset  . Cancer Mother 65       thyroid, malignant brain tumor  . Arthritis Mother        neck, back  . Hepatitis Sister         Hepatitis C  . Heart disease Sister        stent  . Diabetes Maternal Grandmother   . Arthritis Father        back pain  . Heart disease Father        stent    Social History   Socioeconomic History  . Marital status: Married    Spouse name: Not on file  . Number of children: Not on file  . Years of education: Not on file  . Highest education level: Not on file  Occupational History  . Not on file  Tobacco Use  . Smoking status: Former Smoker    Packs/day: 0.50    Years: 20.00    Pack years: 10.00    Types: Cigarettes    Quit date: 07/12/1992    Years since quitting: 27.9  . Smokeless tobacco: Never Used  Substance and Sexual Activity  . Alcohol use: Yes    Comment: occasionaly  . Drug use: No  . Sexual activity: Yes    Partners: Male  Other Topics Concern  . Not on file  Social History Narrative  . Not on file   Social Determinants of Health   Financial Resource Strain: Not on file  Food Insecurity: Not on file  Transportation Needs:  Not on file  Physical Activity: Not on file  Stress: Not on file  Social Connections: Not on file  Intimate Partner Violence: Not on file    Outpatient Medications Prior to Visit  Medication Sig Dispense Refill  . albuterol (VENTOLIN HFA) 108 (90 Base) MCG/ACT inhaler 1-2 puffs q4h prn cough, wheeze, or chest tightness 18 g 3  . baclofen (LIORESAL) 20 MG tablet TAKE 1 TABLET (20 MG) BY MOUTH AT BEDTIME AS NEEDED FOR MUSCLE SPASMS. 90 tablet 3  . BIOTIN PO Take by mouth.    . Calcium Carbonate-Vitamin D 600-400 MG-UNIT tablet Take 1 tablet by mouth daily.    . Cholecalciferol (VITAMIN D3) 1000 UNITS CAPS Take by mouth.    . diazepam (VALIUM) 5 MG tablet Take 5 mg by mouth every 6 (six) hours as needed for anxiety.    . diclofenac (VOLTAREN) 75 MG EC tablet Take 1 tablet (75 mg total) by mouth 2 (two) times daily. 20 tablet 0  . doxycycline (VIBRA-TABS) 100 MG tablet Take 1 tablet (100 mg total) by mouth 2 (two) times daily. 20  tablet 0  . famotidine (PEPCID) 40 MG tablet TAKE 1 TABLET (40 MG TOTAL) BY MOUTH AT BEDTIME. 90 tablet 1  . FLAXSEED, LINSEED, PO Take by mouth every other day.    . fluticasone (FLONASE) 50 MCG/ACT nasal spray PLACE 2 SPRAYS INTO BOTH NOSTRILS DAILY. 16 g 6  . hydrOXYzine (ATARAX/VISTARIL) 50 MG tablet Take 50 mg by mouth every 6 (six) hours as needed.    . lamoTRIgine (LAMICTAL) 150 MG tablet Take 100 mg by mouth daily.     . Nutritional Supplements (ESTROVEN PO) Take by mouth.    Marland Kitchen omeprazole (PRILOSEC) 40 MG capsule TAKE 1 CAPSULE (40 MG TOTAL) BY MOUTH DAILY. 90 capsule 1  . potassium chloride SA (KLOR-CON) 20 MEQ tablet TAKE 1 TABLET (20 MEQ TOTAL) BY MOUTH DAILY. TAKE A 2ND TABLET DIALY AS NEEDED 100 tablet 1  . promethazine (PHENERGAN) 25 MG tablet Take 1 tablet (25 mg total) by mouth every 8 (eight) hours as needed for nausea. 40 tablet 3  . rOPINIRole (REQUIP) 1 MG tablet Take 1 tablet (1 mg total) by mouth daily. 90 tablet 3  . rosuvastatin (CRESTOR) 40 MG tablet TAKE 1 TABLET (40 MG TOTAL) BY MOUTH DAILY. 90 tablet 1  . torsemide (DEMADEX) 20 MG tablet TAKE 1 TABLET (20 MG TOTAL) BY MOUTH DAILY. 30 tablet 2  . traZODone (DESYREL) 50 MG tablet Take 0.5-1 tablets (25-50 mg total) by mouth at bedtime as needed for sleep. 90 tablet 0  . amoxicillin (AMOXIL) 500 MG capsule Take 1 capsule (500 mg total) by mouth 3 (three) times daily. (Patient not taking: No sig reported) 30 capsule 0  . benzonatate (TESSALON PERLES) 100 MG capsule Take 1 capsule (100 mg total) by mouth 3 (three) times daily as needed for cough. (Patient not taking: Reported on 06/24/2020) 40 capsule 0   No facility-administered medications prior to visit.    Allergies  Allergen Reactions  . Sulfur Diarrhea and Nausea And Vomiting  . Carbamazepine Hives  . Ceclor [Cefaclor]     Tongue swell  . Morphine And Related Itching  . Atorvastatin Other (See Comments)    Leg pain    ROS     Objective:    Physical  Exam  BP 106/64   Pulse 79   Temp 98.1 F (36.7 C) (Oral)   Resp 16   Wt 201 lb 6.4 oz (  91.4 kg)   LMP 10/03/2012   SpO2 98%   BMI 34.57 kg/m  Wt Readings from Last 3 Encounters:  06/24/20 201 lb 6.4 oz (91.4 kg)  04/22/20 200 lb (90.7 kg)  03/07/20 195 lb (88.5 kg)    Diabetic Foot Exam - Simple   No data filed    Lab Results  Component Value Date   WBC 6.7 06/24/2020   HGB 14.3 06/24/2020   HCT 43.2 06/24/2020   PLT 230.0 06/24/2020   GLUCOSE 84 06/24/2020   CHOL 160 06/24/2020   TRIG 186.0 (H) 06/24/2020   HDL 54.00 06/24/2020   LDLDIRECT 79.0 04/16/2019   LDLCALC 69 06/24/2020   ALT 20 06/24/2020   AST 19 06/24/2020   NA 140 06/24/2020   K 4.3 06/24/2020   CL 104 06/24/2020   CREATININE 0.84 06/24/2020   BUN 11 06/24/2020   CO2 26 06/24/2020   TSH 0.78 06/24/2020   HGBA1C 5.3 06/24/2020    Lab Results  Component Value Date   TSH 0.78 06/24/2020   Lab Results  Component Value Date   WBC 6.7 06/24/2020   HGB 14.3 06/24/2020   HCT 43.2 06/24/2020   MCV 87.9 06/24/2020   PLT 230.0 06/24/2020   Lab Results  Component Value Date   NA 140 06/24/2020   K 4.3 06/24/2020   CO2 26 06/24/2020   GLUCOSE 84 06/24/2020   BUN 11 06/24/2020   CREATININE 0.84 06/24/2020   BILITOT 0.7 06/24/2020   ALKPHOS 56 06/24/2020   AST 19 06/24/2020   ALT 20 06/24/2020   PROT 7.0 06/24/2020   ALBUMIN 4.5 06/24/2020   CALCIUM 9.7 06/24/2020   ANIONGAP 13 01/22/2015   GFR 74.45 06/24/2020   Lab Results  Component Value Date   CHOL 160 06/24/2020   Lab Results  Component Value Date   HDL 54.00 06/24/2020   Lab Results  Component Value Date   LDLCALC 69 06/24/2020   Lab Results  Component Value Date   TRIG 186.0 (H) 06/24/2020   Lab Results  Component Value Date   CHOLHDL 3 06/24/2020   Lab Results  Component Value Date   HGBA1C 5.3 06/24/2020       Assessment & Plan:   Problem List Items Addressed This Visit    Depression with anxiety     Following with psychiatry and very stressed no change in meds but she will discuss with psychiatry      Hyperlipidemia    Encouraged heart healthy diet, increase exercise, avoid trans fats, consider a krill oil cap daily. Tolerating Rosuvastatin      Relevant Orders   Comprehensive metabolic panel (Completed)   Lipid panel (Completed)   TSH (Completed)   Insomnia    Encouraged good sleep hygiene such as dark, quiet room. No blue/green glowing lights such as computer screens in bedroom. No alcohol or stimulants in evening. Cut down on caffeine as able. Regular exercise is helpful but not just prior to bed time.       RLS (restless legs syndrome)    Hydrate well, monitor labs and started on Mirpex 0.25 mg qhs       RUQ pain   Relevant Orders   CBC (Completed)   Myalgia    Hydrate well and stay active will check labs      Hyperglycemia    hgba1c acceptable, minimize simple carbs. Increase exercise as tolerated.       Relevant Orders   TSH (Completed)   Hemoglobin  A1c (Completed)   Muscle cramp   Relevant Orders   TSH (Completed)   Magnesium (Completed)   Dense breast tissue   Relevant Orders   MM 3D SCREEN BREAST BILATERAL    Other Visit Diagnoses    Encounter for screening for malignant neoplasm of breast, unspecified screening modality    -  Primary   Relevant Orders   MM 3D SCREEN BREAST BILATERAL   At high risk for breast cancer       Relevant Orders   MM 3D SCREEN BREAST BILATERAL   Fibrocystic breast changes, unspecified laterality       Relevant Orders   MM 3D SCREEN BREAST BILATERAL      I have changed Hailley S. Haslem's pramipexole. I am also having her maintain her Calcium Carbonate-Vitamin D, (FLAXSEED, LINSEED, PO), Vitamin D3, BIOTIN PO, Nutritional Supplements (ESTROVEN PO), hydrOXYzine, fluticasone, lamoTRIgine, albuterol, rOPINIRole, baclofen, promethazine, diazepam, famotidine, potassium chloride SA, omeprazole, rosuvastatin, torsemide, doxycycline,  amoxicillin, benzonatate, diclofenac, and traZODone.  Meds ordered this encounter  Medications  . DISCONTD: pramipexole (MIRAPEX) 0.25 MG tablet    Sig: Take 1-2 tablets (0.25-0.5 mg total) by mouth 3 (three) times daily.    Dispense:  60 tablet    Refill:  2  . pramipexole (MIRAPEX) 0.25 MG tablet    Sig: Take 1-2 tablets (0.25-0.5 mg total) by mouth at bedtime.    Dispense:  60 tablet    Refill:  2    Please disregard previous rx for tid dosing     Penni Homans, MD

## 2020-06-25 NOTE — Assessment & Plan Note (Signed)
Hydrate well, monitor labs and started on Mirpex 0.25 mg qhs

## 2020-06-25 NOTE — Assessment & Plan Note (Signed)
Following with psychiatry and very stressed no change in meds but she will discuss with psychiatry

## 2020-06-30 ENCOUNTER — Ambulatory Visit (INDEPENDENT_AMBULATORY_CARE_PROVIDER_SITE_OTHER): Payer: Medicare Other | Admitting: Psychologist

## 2020-06-30 DIAGNOSIS — F4310 Post-traumatic stress disorder, unspecified: Secondary | ICD-10-CM | POA: Diagnosis not present

## 2020-07-18 MED FILL — POTASSIUM CHLORIDE CRYS ER: 20 | 50 days supply | Qty: 100 | Fill #1

## 2020-07-18 MED FILL — FAMOTIDINE 40 MG TABLET: 40 | 90 days supply | Qty: 90 | Fill #1

## 2020-07-28 ENCOUNTER — Ambulatory Visit: Payer: Medicare Other | Admitting: Psychologist

## 2020-07-28 ENCOUNTER — Ambulatory Visit (INDEPENDENT_AMBULATORY_CARE_PROVIDER_SITE_OTHER): Payer: Medicare Other | Admitting: Psychologist

## 2020-07-28 DIAGNOSIS — F4310 Post-traumatic stress disorder, unspecified: Secondary | ICD-10-CM

## 2020-08-05 ENCOUNTER — Telehealth: Payer: Self-pay | Admitting: Family Medicine

## 2020-08-05 ENCOUNTER — Other Ambulatory Visit: Payer: Self-pay | Admitting: Family Medicine

## 2020-08-05 NOTE — Telephone Encounter (Signed)
Patient called in reference to restless leg syndrome, patient states she  Was seen by Dr Charlett Blake in 06/2020. Patient would like Dr Charlett Blake to know that the medication prescribed to her made her short of breath and she stop taking it  pramipexole (MIRAPEX) 0.25 MG tablet [[id number]  Patient would like to know if Dr Charlett Blake would recommend another medication and also something for her cold sxs.

## 2020-08-05 NOTE — Telephone Encounter (Signed)
The only other med that is for RLS is Requip which she has already tried. We could try Amitriptyline which is an old school antidepressant we use for nerve pain. If she wants to discuss we can set up a VV. For the cold encourage increased rest and hydration, add probiotics, zinc such as Coldeze or Xicam. Treat fevers as needed, add Vitamin c 500 mg twice a day and elderberry. Get covid tested, stay quarantined til labs available and make appt if worse

## 2020-08-05 NOTE — Telephone Encounter (Signed)
Please advise 

## 2020-08-06 MED FILL — OMEPRAZOLE 40 MG CPDR: 40 | 90 days supply | Qty: 90 | Fill #1

## 2020-08-06 NOTE — Telephone Encounter (Signed)
Pt is schedule for 08/12/20 at 11:20 for discussion about medication

## 2020-08-12 ENCOUNTER — Telehealth (INDEPENDENT_AMBULATORY_CARE_PROVIDER_SITE_OTHER): Payer: Medicare Other | Admitting: Family Medicine

## 2020-08-12 ENCOUNTER — Other Ambulatory Visit: Payer: Self-pay | Admitting: Family Medicine

## 2020-08-12 ENCOUNTER — Other Ambulatory Visit: Payer: Self-pay

## 2020-08-12 DIAGNOSIS — F418 Other specified anxiety disorders: Secondary | ICD-10-CM

## 2020-08-12 DIAGNOSIS — R739 Hyperglycemia, unspecified: Secondary | ICD-10-CM | POA: Diagnosis not present

## 2020-08-12 DIAGNOSIS — E782 Mixed hyperlipidemia: Secondary | ICD-10-CM | POA: Diagnosis not present

## 2020-08-12 DIAGNOSIS — G4700 Insomnia, unspecified: Secondary | ICD-10-CM | POA: Diagnosis not present

## 2020-08-12 MED ORDER — RISPERIDONE 0.5 MG PO TABS: 0.5000 mg | ORAL_TABLET | Freq: Every day | ORAL | 1 refills | Status: DC

## 2020-08-12 MED ORDER — AMITRIPTYLINE HCL 10 MG PO TABS: 10.0000 mg | ORAL_TABLET | Freq: Every day | ORAL | 3 refills | Status: DC

## 2020-08-12 MED ORDER — ALBUTEROL SULFATE HFA 108 (90 BASE) MCG/ACT IN AERS: INHALATION_SPRAY | RESPIRATORY_TRACT | 3 refills | Status: DC

## 2020-08-12 MED ORDER — LAMOTRIGINE 50 MG PO TBDP: 50.0000 mg | ORAL_TABLET | Freq: Every day | ORAL | 3 refills | Status: DC

## 2020-08-12 MED ORDER — DOXYCYCLINE HYCLATE 100 MG PO TABS: 100.0000 mg | ORAL_TABLET | Freq: Two times a day (BID) | ORAL | 0 refills | Status: DC

## 2020-08-12 MED FILL — AMITRIPTYLINE HCL 10 MG TAB: 10 | 30 days supply | Qty: 60 | Fill #0

## 2020-08-12 MED FILL — VENTOLIN HFA 90 MCG INHALER: 108 (90 BAS | 17 days supply | Qty: 18 | Fill #0

## 2020-08-12 MED FILL — risperiDONE 0.5 MG TABS: 0.5 | 90 days supply | Qty: 90 | Fill #0

## 2020-08-12 MED FILL — DOXYCYCLINE HYCLATE 100 MG: 100 | 10 days supply | Qty: 20 | Fill #0

## 2020-08-12 MED FILL — LAMOTRIGINE 50 MG TBDP: 50 | 30 days supply | Qty: 30 | Fill #0

## 2020-08-12 NOTE — Assessment & Plan Note (Signed)
hgba1c acceptable, minimize simple carbs. Increase exercise as tolerated.  

## 2020-08-12 NOTE — Assessment & Plan Note (Addendum)
Has stopped seeing her psychiatrist Pauline Good but is willing to accept a new referral so that is placed. Given refill on her Risperdal they started her on a few months ago. Continue Lamictal at 50 mg daily she has been taking that dose for a few months also down from 100 mg and has been doing well. Can use Hydroxyzine 50 mg prn during the day for anxiety especially as she gets ready for her court date to handle her divorce which has been very stressful

## 2020-08-12 NOTE — Assessment & Plan Note (Signed)
Encouraged heart healthy diet, increase exercise, avoid trans fats, consider a krill oil cap daily 

## 2020-08-12 NOTE — Progress Notes (Signed)
Virtual Visit via phone Note  I connected with [name] on 08/12/20 at 11:20 AM EST by a phone enabled telemedicine application and verified that I am speaking with the correct person using two identifiers.  Location: Patient: home, patient and provider in visit Provider: office   I discussed the limitations of evaluation and management by telemedicine and the availability of in person appointments. The patient expressed understanding and agreed to proceed. S Chism, CMA  was able to get the patient set up on a phone visit after being unable to set up a video visit    Subjective:    Patient ID: [name], female    DOB: 28-Jul-1955, 65 y.o.   MRN: [id number]  Chief Complaint  Patient presents with  . Cough  . Sinus Problem  . chest congestion  . Fever    Temp is down and doesn't have a fever anymore. All symptoms stated about 10 days ago.     HPI Patient is in today for follow up on chronic medical concerns. No recent febrile illness or hospitalizations. She is preparing for her divorce case and is very stressed but managing with current meds. Denies CP/palp/SOB/HA/congestion/fevers/GI or GU c/o. Taking meds as prescribed. She continues to struggle with bilateral lower leg pain at night it is burning and she is ready to try something to manage it.  Past Medical History:  Diagnosis Date  . Acute sinusitis 03/27/2013  . Allergy    seasonal  . Anxiety   . Arthritis 06/09/2013  . Depression   . Dyspareunia   . Edema extremities   . Encounter for preventative adult health care exam with abnormal findings 04/21/2014  . GERD (gastroesophageal reflux disease)   . Hip pain, left 11/01/2011  . History of proteinuria syndrome   . Hyperlipidemia   . Insomnia 03/11/2013  . Low back pain 05/09/2011  . Lymphadenitis 06/16/2012  . Myalgia and myositis 02/15/2015  . Obese   . Onychomycosis 05/09/2011  . Oral lesion 08/26/2013  . Otitis externa 06/23/2012   left  . Perimenopause  03/11/2013  . RLS (restless legs syndrome)   . RUQ pain 09/20/2011  . Sleep apnea 04/21/2014  . Tinea corporis 04/26/2017  . Unspecified constipation 05/13/2013  . Urinary frequency 08/26/2013    Past Surgical History:  Procedure Laterality Date  . BACK SURGERY  2006   low, discectomy for herniated disc  . BLADDER SUSPENSION N/A 11/07/2012   Procedure: [facility] SLING;  Surgeon: Malka So, MD;  Location: [facility];  Service: Urology;  Laterality: N/A;  . CARPAL TUNNEL RELEASE Right 2006  . CESAREAN SECTION  1993  . CYSTOSCOPY N/A 11/07/2012   Procedure: CYSTOSCOPY;  Surgeon: Malka So, MD;  Location: [facility];  Service: Urology;  Laterality: N/A;  . TUBAL LIGATION  1993    Family History  Problem Relation Age of Onset  . Cancer Mother 50       thyroid, malignant brain tumor  . Arthritis Mother        neck, back  . Hepatitis Sister        Hepatitis C  . Heart disease Sister        stent  . Diabetes Maternal Grandmother   . Arthritis Father        back pain  . Heart disease Father        stent    Social History   Socioeconomic History  . Marital status: Married  Spouse name: Not on file  . Number of children: Not on file  . Years of education: Not on file  . Highest education level: Not on file  Occupational History  . Not on file  Tobacco Use  . Smoking status: Former Smoker    Packs/day: 0.50    Years: 20.00    Pack years: 10.00    Types: Cigarettes    Quit date: 07/12/1992    Years since quitting: 28.1  . Smokeless tobacco: Never Used  Substance and Sexual Activity  . Alcohol use: Yes    Comment: occasionaly  . Drug use: No  . Sexual activity: Yes    Partners: Male  Other Topics Concern  . Not on file  Social History Narrative  . Not on file   Social Determinants of Health   Financial Resource Strain: Not on file  Food Insecurity: Not on file  Transportation Needs: Not on file  Physical Activity: Not on file   Stress: Not on file  Social Connections: Not on file  Intimate Partner Violence: Not on file    Outpatient Medications Prior to Visit  Medication Sig Dispense Refill  . baclofen (LIORESAL) 20 MG tablet TAKE 1 TABLET (20 MG) BY MOUTH AT BEDTIME AS NEEDED FOR MUSCLE SPASMS. 90 tablet 3  . BIOTIN PO Take by mouth.    . Calcium Carbonate-Vitamin D 600-400 MG-UNIT tablet Take 1 tablet by mouth daily.    . Cholecalciferol (VITAMIN D3) 1000 UNITS CAPS Take by mouth.    . diazepam (VALIUM) 5 MG tablet Take 5 mg by mouth every 6 (six) hours as needed for anxiety.    . diclofenac (VOLTAREN) 75 MG EC tablet Take 1 tablet (75 mg total) by mouth 2 (two) times daily. 20 tablet 0  . famotidine (PEPCID) 40 MG tablet TAKE 1 TABLET (40 MG TOTAL) BY MOUTH AT BEDTIME. 90 tablet 1  . FLAXSEED, LINSEED, PO Take by mouth every other day.    . fluticasone (FLONASE) 50 MCG/ACT nasal spray PLACE 2 SPRAYS INTO BOTH NOSTRILS DAILY. 16 g 6  . hydrOXYzine (ATARAX/VISTARIL) 50 MG tablet Take 50 mg by mouth every 6 (six) hours as needed.    . Nutritional Supplements (ESTROVEN PO) Take by mouth.    Marland Kitchen omeprazole (PRILOSEC) 40 MG capsule TAKE 1 CAPSULE (40 MG TOTAL) BY MOUTH DAILY. 90 capsule 1  . potassium chloride SA (KLOR-CON) 20 MEQ tablet TAKE 1 TABLET (20 MEQ TOTAL) BY MOUTH DAILY. TAKE A 2ND TABLET DIALY AS NEEDED 100 tablet 1  . promethazine (PHENERGAN) 25 MG tablet Take 1 tablet (25 mg total) by mouth every 8 (eight) hours as needed for nausea. 40 tablet 3  . rosuvastatin (CRESTOR) 40 MG tablet TAKE 1 TABLET (40 MG TOTAL) BY MOUTH DAILY. 90 tablet 1  . torsemide (DEMADEX) 20 MG tablet TAKE 1 TABLET (20 MG TOTAL) BY MOUTH DAILY. 30 tablet 2  . albuterol (VENTOLIN HFA) 108 (90 Base) MCG/ACT inhaler 1-2 puffs q4h prn cough, wheeze, or chest tightness 18 g 3  . doxycycline (VIBRA-TABS) 100 MG tablet Take 1 tablet (100 mg total) by mouth 2 (two) times daily. 20 tablet 0  . lamoTRIgine (LAMICTAL) 150 MG tablet Take 100  mg by mouth daily.     . traZODone (DESYREL) 50 MG tablet Take 0.5-1 tablets (25-50 mg total) by mouth at bedtime as needed for sleep. 90 tablet 0  . benzonatate (TESSALON PERLES) 100 MG capsule Take 1 capsule (100 mg total) by mouth 3 (  three) times daily as needed for cough. (Patient not taking: No sig reported) 40 capsule 0  . amoxicillin (AMOXIL) 500 MG capsule Take 1 capsule (500 mg total) by mouth 3 (three) times daily. (Patient not taking: No sig reported) 30 capsule 0   No facility-administered medications prior to visit.    Allergies  Allergen Reactions  . Elemental Sulfur Diarrhea and Nausea And Vomiting  . Carbamazepine Hives  . Ceclor [Cefaclor]     Tongue swell  . Morphine And Related Itching  . Atorvastatin Other (See Comments)    Leg pain    Review of Systems  Constitutional: Positive for malaise/fatigue. Negative for fever.  HENT: Negative for congestion.   Eyes: Negative for blurred vision.  Respiratory: Positive for shortness of breath.   Cardiovascular: Negative for chest pain, palpitations and leg swelling.  Gastrointestinal: Negative for abdominal pain, blood in stool and nausea.  Genitourinary: Negative for dysuria and frequency.  Musculoskeletal: Positive for back pain and myalgias. Negative for falls.  Skin: Negative for rash.  Neurological: Negative for dizziness, loss of consciousness and headaches.  Endo/Heme/Allergies: Negative for environmental allergies.  Psychiatric/Behavioral: Positive for depression. Negative for hallucinations, substance abuse and suicidal ideas. The patient is nervous/anxious and has insomnia.        Objective:    Physical Exam  Unable to obtain via phone  LMP 10/03/2012  Wt Readings from Last 3 Encounters:  06/24/20 201 lb 6.4 oz (91.4 kg)  04/22/20 200 lb (90.7 kg)  03/07/20 195 lb (88.5 kg)    Diabetic Foot Exam - Simple   No data filed    Lab Results  Component Value Date   WBC 6.7 06/24/2020   HGB 14.3  06/24/2020   HCT 43.2 06/24/2020   PLT 230.0 06/24/2020   GLUCOSE 84 06/24/2020   CHOL 160 06/24/2020   TRIG 186.0 (H) 06/24/2020   HDL 54.00 06/24/2020   LDLDIRECT 79.0 04/16/2019   LDLCALC 69 06/24/2020   ALT 20 06/24/2020   AST 19 06/24/2020   NA 140 06/24/2020   K 4.3 06/24/2020   CL 104 06/24/2020   CREATININE 0.84 06/24/2020   BUN 11 06/24/2020   CO2 26 06/24/2020   TSH 0.78 06/24/2020   HGBA1C 5.3 06/24/2020    Lab Results  Component Value Date   TSH 0.78 06/24/2020   Lab Results  Component Value Date   WBC 6.7 06/24/2020   HGB 14.3 06/24/2020   HCT 43.2 06/24/2020   MCV 87.9 06/24/2020   PLT 230.0 06/24/2020   Lab Results  Component Value Date   NA 140 06/24/2020   K 4.3 06/24/2020   CO2 26 06/24/2020   GLUCOSE 84 06/24/2020   BUN 11 06/24/2020   CREATININE 0.84 06/24/2020   BILITOT 0.7 06/24/2020   ALKPHOS 56 06/24/2020   AST 19 06/24/2020   ALT 20 06/24/2020   PROT 7.0 06/24/2020   ALBUMIN 4.5 06/24/2020   CALCIUM 9.7 06/24/2020   ANIONGAP 13 01/22/2015   GFR 74.45 06/24/2020   Lab Results  Component Value Date   CHOL 160 06/24/2020   Lab Results  Component Value Date   HDL 54.00 06/24/2020   Lab Results  Component Value Date   LDLCALC 69 06/24/2020   Lab Results  Component Value Date   TRIG 186.0 (H) 06/24/2020   Lab Results  Component Value Date   CHOLHDL 3 06/24/2020   Lab Results  Component Value Date   HGBA1C 5.3 06/24/2020  Assessment & Plan:   Problem List Items Addressed This Visit    Depression with anxiety - Primary    Has stopped seeing her psychiatrist Pauline Good but is willing to accept a new referral so that is placed. Given refill on her Risperdal they started her on a few months ago. Continue Lamictal at 50 mg daily she has been taking that dose for a few months also down from 100 mg and has been doing well. Can use Hydroxyzine 50 mg prn during the day for anxiety especially as she gets ready for her  court date to handle her divorce which has been very stressful      Relevant Medications   amitriptyline (ELAVIL) 10 MG tablet   Other Relevant Orders   Ambulatory referral to Psychiatry   Hyperlipidemia    Encouraged heart healthy diet, increase exercise, avoid trans fats, consider a krill oil cap daily      Insomnia    Trazodone at 25 to 50 mg has been helping but we are going to try switching to Amitriptyline to see if that can help sleep and her leg pain/peripheral neuropathy at night as well. Can use Diazepam prn qhs for insomnia as well.      Relevant Orders   Ambulatory referral to Psychiatry   Hyperglycemia    hgba1c acceptable, minimize simple carbs. Increase exercise as tolerated.         I have discontinued Vicki Mallet. Leer's lamoTRIgine, amoxicillin, and traZODone. I am also having her start on lamoTRIgine, amitriptyline, and risperiDONE. Additionally, I am having her maintain her Calcium Carbonate-Vitamin D, (FLAXSEED, LINSEED, PO), Vitamin D3, BIOTIN PO, Nutritional Supplements (ESTROVEN PO), hydrOXYzine, fluticasone, baclofen, promethazine, diazepam, famotidine, potassium chloride SA, omeprazole, rosuvastatin, torsemide, benzonatate, diclofenac, doxycycline, and albuterol.  Meds ordered this encounter  Medications  . doxycycline (VIBRA-TABS) 100 MG tablet    Sig: Take 1 tablet (100 mg total) by mouth 2 (two) times daily.    Dispense:  20 tablet    Refill:  0  . albuterol (VENTOLIN HFA) 108 (90 Base) MCG/ACT inhaler    Sig: 1-2 puffs q4h prn cough, wheeze, or chest tightness    Dispense:  18 g    Refill:  3    May dispense whichever albuterol HFA is preferred by her insurer.  . lamoTRIgine 50 MG TBDP    Sig: Take 1 tablet (50 mg total) by mouth daily.    Dispense:  30 tablet    Refill:  3  . amitriptyline (ELAVIL) 10 MG tablet    Sig: Take 1-2 tablets (10-20 mg total) by mouth at bedtime.    Dispense:  60 tablet    Refill:  3  . risperiDONE (RISPERDAL) 0.5  MG tablet    Sig: Take 1 tablet (0.5 mg total) by mouth at bedtime.    Dispense:  90 tablet    Refill:  1    I discussed the assessment and treatment plan with the patient. The patient was provided an opportunity to ask questions and all were answered. The patient agreed with the plan and demonstrated an understanding of the instructions.   The patient was advised to call back or seek an in-person evaluation if the symptoms worsen or if the condition fails to improve as anticipated.  I provided 25 minutes of non-face-to-face time during this encounter.   Penni Homans, MD

## 2020-08-12 NOTE — Assessment & Plan Note (Signed)
Trazodone at 25 to 50 mg has been helping but we are going to try switching to Amitriptyline to see if that can help sleep and her leg pain/peripheral neuropathy at night as well. Can use Diazepam prn qhs for insomnia as well.

## 2020-08-14 ENCOUNTER — Other Ambulatory Visit: Payer: Medicare Other

## 2020-08-14 DIAGNOSIS — Z20822 Contact with and (suspected) exposure to covid-19: Secondary | ICD-10-CM | POA: Diagnosis not present

## 2020-08-15 LAB — SARS-COV-2, NAA 2 DAY TAT

## 2020-08-15 LAB — NOVEL CORONAVIRUS, NAA: SARS-CoV-2, NAA: NOT DETECTED

## 2020-09-02 ENCOUNTER — Ambulatory Visit (INDEPENDENT_AMBULATORY_CARE_PROVIDER_SITE_OTHER): Payer: Medicare Other | Admitting: Psychologist

## 2020-09-02 DIAGNOSIS — F4310 Post-traumatic stress disorder, unspecified: Secondary | ICD-10-CM | POA: Diagnosis not present

## 2020-09-08 ENCOUNTER — Other Ambulatory Visit: Payer: Self-pay | Admitting: Family Medicine

## 2020-09-08 ENCOUNTER — Other Ambulatory Visit: Payer: Self-pay

## 2020-09-08 ENCOUNTER — Ambulatory Visit (HOSPITAL_BASED_OUTPATIENT_CLINIC_OR_DEPARTMENT_OTHER)
Admission: RE | Admit: 2020-09-08 | Discharge: 2020-09-08 | Disposition: A | Discharge status: Home / Self Care | Payer: Medicare Other | Source: Ambulatory Visit | Attending: Family Medicine | Admitting: Family Medicine

## 2020-09-08 ENCOUNTER — Encounter (HOSPITAL_BASED_OUTPATIENT_CLINIC_OR_DEPARTMENT_OTHER): Payer: Self-pay

## 2020-09-08 DIAGNOSIS — N6019 Diffuse cystic mastopathy of unspecified breast: Secondary | ICD-10-CM | POA: Diagnosis present

## 2020-09-08 DIAGNOSIS — Z9189 Other specified personal risk factors, not elsewhere classified: Secondary | ICD-10-CM | POA: Diagnosis not present

## 2020-09-08 DIAGNOSIS — R922 Inconclusive mammogram: Secondary | ICD-10-CM

## 2020-09-08 DIAGNOSIS — Z1239 Encounter for other screening for malignant neoplasm of breast: Secondary | ICD-10-CM

## 2020-09-08 DIAGNOSIS — Z1231 Encounter for screening mammogram for malignant neoplasm of breast: Secondary | ICD-10-CM | POA: Insufficient documentation

## 2020-09-08 MED FILL — ROSUVASTATIN CALCIUM 40 MG: 40 | 90 days supply | Qty: 90 | Fill #1

## 2020-09-08 MED FILL — BACLOFEN 20 MG TABS: 20 | 90 days supply | Qty: 90 | Fill #0

## 2020-09-15 MED FILL — AMITRIPTYLINE HCL 10 MG TAB: 10 | 30 days supply | Qty: 60 | Fill #1

## 2020-09-18 ENCOUNTER — Telehealth (HOSPITAL_COMMUNITY): Payer: Medicare Other | Admitting: Psychiatry

## 2020-10-10 ENCOUNTER — Other Ambulatory Visit: Payer: Self-pay | Admitting: Family Medicine

## 2020-10-10 ENCOUNTER — Telehealth: Payer: Self-pay | Admitting: Family Medicine

## 2020-10-10 MED ORDER — DIAZEPAM 5 MG PO TABS
5.0000 mg | ORAL_TABLET | Freq: Four times a day (QID) | ORAL | 1 refills | Status: DC | PRN
  Filled 2020-10-10: qty 30, 8d supply, fill #0
  Filled 2020-11-13: qty 30, 8d supply, fill #1

## 2020-10-10 NOTE — Telephone Encounter (Signed)
Requesting:valium 5 mg  Contract:08/01/2017 UDS:08/01/2017 Last Visit:08/12/2020 Next Visit:06/25/21 Last Refill:08/14/19  Please Advise

## 2020-10-10 NOTE — Telephone Encounter (Signed)
I have sent in a Refill but to keep a controlled substance she has to be seen every 6 months at a minimum so she needs another appt no later than August, please arrange

## 2020-10-10 NOTE — Telephone Encounter (Signed)
Medication: diazepam (VALIUM) 5 MG tablet [[id number]]      Has the patient contacted their pharmacy? no (If no, request that the patient contact the pharmacy for the refill.) (If yes, when and what did the pharmacy advise?)    Preferred Pharmacy (with phone number or street name):   [place], [place] Phone:  [phone]  Fax:  [phone]         Agent: Please be advised that RX refills may take up to 3 business days. We ask that you follow-up with your pharmacy.

## 2020-10-11 ENCOUNTER — Other Ambulatory Visit (HOSPITAL_BASED_OUTPATIENT_CLINIC_OR_DEPARTMENT_OTHER): Payer: Self-pay

## 2020-10-13 ENCOUNTER — Other Ambulatory Visit: Payer: Self-pay | Admitting: Family Medicine

## 2020-10-13 ENCOUNTER — Other Ambulatory Visit (HOSPITAL_BASED_OUTPATIENT_CLINIC_OR_DEPARTMENT_OTHER): Payer: Self-pay

## 2020-10-13 MED ORDER — FAMOTIDINE 40 MG PO TABS
40.0000 mg | ORAL_TABLET | Freq: Every day | ORAL | 3 refills | Status: DC
  Filled 2020-10-13: qty 90, 90d supply, fill #0
  Filled 2021-01-12: qty 90, 90d supply, fill #1
  Filled 2021-04-16: qty 90, 90d supply, fill #2
  Filled 2021-07-30: qty 90, 90d supply, fill #3

## 2020-10-13 MED FILL — Amitriptyline HCl Tab 10 MG: ORAL | 30 days supply | Qty: 60 | Fill #0 | Status: AC

## 2020-10-13 NOTE — Telephone Encounter (Signed)
Lvm to call in to set up appointment for August.

## 2020-11-12 ENCOUNTER — Other Ambulatory Visit (HOSPITAL_BASED_OUTPATIENT_CLINIC_OR_DEPARTMENT_OTHER): Payer: Self-pay

## 2020-11-13 ENCOUNTER — Other Ambulatory Visit (HOSPITAL_BASED_OUTPATIENT_CLINIC_OR_DEPARTMENT_OTHER): Payer: Self-pay

## 2020-11-13 ENCOUNTER — Other Ambulatory Visit: Payer: Self-pay | Admitting: Family Medicine

## 2020-11-13 MED ORDER — POTASSIUM CHLORIDE CRYS ER 20 MEQ PO TBCR
EXTENDED_RELEASE_TABLET | ORAL | 1 refills | Status: DC
  Filled 2020-11-13: qty 100, 90d supply, fill #0
  Filled 2021-02-12: qty 100, 90d supply, fill #1

## 2020-11-13 MED ORDER — OMEPRAZOLE 40 MG PO CPDR
DELAYED_RELEASE_CAPSULE | Freq: Every day | ORAL | 1 refills | Status: DC
  Filled 2020-11-13: qty 90, 90d supply, fill #0
  Filled 2021-02-12: qty 90, 90d supply, fill #1

## 2020-11-13 MED FILL — Amitriptyline HCl Tab 10 MG: ORAL | 30 days supply | Qty: 60 | Fill #1 | Status: AC

## 2020-11-13 MED FILL — Risperidone Tab 0.5 MG: ORAL | 90 days supply | Qty: 90 | Fill #0 | Status: AC

## 2020-11-14 ENCOUNTER — Other Ambulatory Visit (HOSPITAL_BASED_OUTPATIENT_CLINIC_OR_DEPARTMENT_OTHER): Payer: Self-pay

## 2020-12-04 ENCOUNTER — Other Ambulatory Visit: Payer: Self-pay | Admitting: Family Medicine

## 2020-12-04 ENCOUNTER — Other Ambulatory Visit (HOSPITAL_BASED_OUTPATIENT_CLINIC_OR_DEPARTMENT_OTHER): Payer: Self-pay

## 2020-12-04 MED ORDER — ROSUVASTATIN CALCIUM 40 MG PO TABS
ORAL_TABLET | Freq: Every day | ORAL | 1 refills | Status: DC
  Filled 2020-12-04: qty 90, 90d supply, fill #0
  Filled 2021-03-05: qty 90, 90d supply, fill #1

## 2020-12-04 MED FILL — Baclofen Tab 20 MG: ORAL | 90 days supply | Qty: 90 | Fill #0 | Status: AC

## 2020-12-05 ENCOUNTER — Other Ambulatory Visit (HOSPITAL_BASED_OUTPATIENT_CLINIC_OR_DEPARTMENT_OTHER): Payer: Self-pay

## 2020-12-10 ENCOUNTER — Other Ambulatory Visit: Payer: Self-pay | Admitting: Family Medicine

## 2020-12-11 ENCOUNTER — Other Ambulatory Visit (HOSPITAL_BASED_OUTPATIENT_CLINIC_OR_DEPARTMENT_OTHER): Payer: Self-pay

## 2020-12-11 MED ORDER — DIAZEPAM 5 MG PO TABS
5.0000 mg | ORAL_TABLET | Freq: Four times a day (QID) | ORAL | 1 refills | Status: DC | PRN
  Filled 2020-12-11: qty 30, 8d supply, fill #0
  Filled 2021-01-12: qty 30, 8d supply, fill #1

## 2020-12-11 NOTE — Telephone Encounter (Signed)
Requesting: diazepam Contract: 11/11/16 UDS: 04/26/17 Last Visit: 08/12/20 Next Visit: 02/19/21 Last Refill: 10/10/20  Please Advise

## 2020-12-24 ENCOUNTER — Other Ambulatory Visit: Payer: Self-pay | Admitting: Family Medicine

## 2020-12-25 ENCOUNTER — Other Ambulatory Visit (HOSPITAL_BASED_OUTPATIENT_CLINIC_OR_DEPARTMENT_OTHER): Payer: Self-pay

## 2020-12-25 MED ORDER — AMITRIPTYLINE HCL 10 MG PO TABS
10.0000 mg | ORAL_TABLET | Freq: Every day | ORAL | 1 refills | Status: DC
  Filled 2020-12-25: qty 180, 90d supply, fill #0

## 2021-01-13 ENCOUNTER — Other Ambulatory Visit (HOSPITAL_BASED_OUTPATIENT_CLINIC_OR_DEPARTMENT_OTHER): Payer: Self-pay

## 2021-02-12 ENCOUNTER — Other Ambulatory Visit (HOSPITAL_BASED_OUTPATIENT_CLINIC_OR_DEPARTMENT_OTHER): Payer: Self-pay

## 2021-02-12 ENCOUNTER — Other Ambulatory Visit: Payer: Self-pay | Admitting: Family Medicine

## 2021-02-12 MED ORDER — RISPERIDONE 0.5 MG PO TABS
ORAL_TABLET | Freq: Every day | ORAL | 1 refills | Status: DC
  Filled 2021-02-12: qty 90, 90d supply, fill #0

## 2021-02-12 NOTE — Telephone Encounter (Signed)
Requesting: diazepam Contract: 11/11/16 UDS: 04/26/17 Last Visit: 08/12/20 Next Visit: 02/19/21 Last Refill: 12/11/20  Please Advise

## 2021-02-13 ENCOUNTER — Other Ambulatory Visit (HOSPITAL_BASED_OUTPATIENT_CLINIC_OR_DEPARTMENT_OTHER): Payer: Self-pay

## 2021-02-13 MED ORDER — DIAZEPAM 5 MG PO TABS
5.0000 mg | ORAL_TABLET | Freq: Four times a day (QID) | ORAL | 1 refills | Status: DC | PRN
  Filled 2021-02-13: qty 30, 8d supply, fill #0
  Filled 2021-04-05: qty 30, 8d supply, fill #1

## 2021-02-13 NOTE — Telephone Encounter (Signed)
Can you refill in Blyths absence?

## 2021-02-16 ENCOUNTER — Other Ambulatory Visit (HOSPITAL_BASED_OUTPATIENT_CLINIC_OR_DEPARTMENT_OTHER): Payer: Self-pay

## 2021-02-19 ENCOUNTER — Telehealth (INDEPENDENT_AMBULATORY_CARE_PROVIDER_SITE_OTHER): Payer: Medicare Other | Admitting: Family Medicine

## 2021-02-19 ENCOUNTER — Other Ambulatory Visit: Payer: Self-pay

## 2021-02-19 ENCOUNTER — Ambulatory Visit: Payer: Medicare Other | Admitting: Family Medicine

## 2021-02-19 ENCOUNTER — Other Ambulatory Visit (HOSPITAL_BASED_OUTPATIENT_CLINIC_OR_DEPARTMENT_OTHER): Payer: Self-pay

## 2021-02-19 DIAGNOSIS — G4700 Insomnia, unspecified: Secondary | ICD-10-CM | POA: Diagnosis not present

## 2021-02-19 DIAGNOSIS — E782 Mixed hyperlipidemia: Secondary | ICD-10-CM | POA: Diagnosis not present

## 2021-02-19 DIAGNOSIS — K219 Gastro-esophageal reflux disease without esophagitis: Secondary | ICD-10-CM | POA: Diagnosis not present

## 2021-02-19 DIAGNOSIS — F418 Other specified anxiety disorders: Secondary | ICD-10-CM

## 2021-02-19 DIAGNOSIS — R739 Hyperglycemia, unspecified: Secondary | ICD-10-CM

## 2021-02-19 MED ORDER — AMITRIPTYLINE HCL 25 MG PO TABS
25.0000 mg | ORAL_TABLET | Freq: Every day | ORAL | 3 refills | Status: DC
  Filled 2021-02-19: qty 30, 30d supply, fill #0
  Filled 2021-03-18: qty 30, 30d supply, fill #1
  Filled 2021-04-16: qty 30, 30d supply, fill #2
  Filled 2021-05-18: qty 30, 30d supply, fill #3

## 2021-02-19 MED ORDER — FLUOXETINE HCL 20 MG PO TABS
20.0000 mg | ORAL_TABLET | Freq: Every day | ORAL | 3 refills | Status: DC
  Filled 2021-02-19: qty 90, 90d supply, fill #0

## 2021-02-19 NOTE — Assessment & Plan Note (Signed)
Encouraged good sleep hygiene such as dark, quiet room. No blue/green glowing lights such as computer screens in bedroom. No alcohol or stimulants in evening. Cut down on caffeine as able. Regular exercise is helpful but not just prior to bed time. Increase Amitriptyline to 25 mg qhs
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
## 2021-02-19 NOTE — Assessment & Plan Note (Addendum)
She has switched her counseling to [facility] and she has her second visit next week, she is tearful but she is getting back. Add Fluoxetine 20 mg daily. Stop Risperdal

## 2021-02-19 NOTE — Assessment & Plan Note (Signed)
Avoid offending foods, start probiotics. Do not eat large meals in late evening and consider raising head of bed.  

## 2021-02-19 NOTE — Assessment & Plan Note (Signed)
hgba1c acceptable, minimize simple carbs. Increase exercise as tolerated.  

## 2021-02-19 NOTE — Assessment & Plan Note (Signed)
Encourage heart healthy diet such as MIND or DASH diet, increase exercise, avoid trans fats, simple carbohydrates and processed foods, consider a krill or fish or flaxseed oil cap daily.  °

## 2021-03-05 ENCOUNTER — Other Ambulatory Visit (HOSPITAL_BASED_OUTPATIENT_CLINIC_OR_DEPARTMENT_OTHER): Payer: Self-pay

## 2021-03-05 MED FILL — Baclofen Tab 20 MG: ORAL | 90 days supply | Qty: 90 | Fill #1 | Status: AC

## 2021-03-19 ENCOUNTER — Other Ambulatory Visit (HOSPITAL_BASED_OUTPATIENT_CLINIC_OR_DEPARTMENT_OTHER): Payer: Self-pay

## 2021-03-23 ENCOUNTER — Telehealth: Payer: Self-pay | Admitting: Family Medicine

## 2021-03-23 NOTE — Telephone Encounter (Signed)
Patient did a virtual with you on 02/19/21 and follow up appt is 05/04/21.  This was your assessment and plan from 02/19/21  Assessment and plan:   Depression with anxiety       She has switched her counseling to [facility] and she has her second visit next week, she is tearful but she is getting back. Add Fluoxetine 20 mg daily. Stop Risperdal        Relevant Medications    amitriptyline (ELAVIL) 25 MG tablet    FLUoxetine (PROZAC) 20 MG tablet

## 2021-03-23 NOTE — Telephone Encounter (Signed)
Patient called stating she is having a reaction to her Prozac. She states that is has made her more paranoid, cannot sleep, anxiety has worsen. She wants to know if she can just take half a pill until she can get in to see her. She was offered to make a OV with another provider but she said she would wait to see [facility] asap. Please advice.

## 2021-03-24 NOTE — Telephone Encounter (Signed)
Patient notified and she did not take medication last night and she felt better.  She will follow up in October.

## 2021-04-06 ENCOUNTER — Other Ambulatory Visit (HOSPITAL_BASED_OUTPATIENT_CLINIC_OR_DEPARTMENT_OTHER): Payer: Self-pay

## 2021-04-17 ENCOUNTER — Other Ambulatory Visit (HOSPITAL_BASED_OUTPATIENT_CLINIC_OR_DEPARTMENT_OTHER): Payer: Self-pay

## 2021-05-04 ENCOUNTER — Other Ambulatory Visit: Payer: Self-pay

## 2021-05-04 ENCOUNTER — Ambulatory Visit (INDEPENDENT_AMBULATORY_CARE_PROVIDER_SITE_OTHER): Payer: Medicare Other | Admitting: Family Medicine

## 2021-05-04 ENCOUNTER — Encounter: Payer: Self-pay | Admitting: Family Medicine

## 2021-05-04 ENCOUNTER — Other Ambulatory Visit (HOSPITAL_BASED_OUTPATIENT_CLINIC_OR_DEPARTMENT_OTHER): Payer: Self-pay

## 2021-05-04 VITALS — BP 102/60 | HR 93 | Temp 98.0°F | Resp 16 | Wt 201.2 lb

## 2021-05-04 DIAGNOSIS — R739 Hyperglycemia, unspecified: Secondary | ICD-10-CM

## 2021-05-04 DIAGNOSIS — G4700 Insomnia, unspecified: Secondary | ICD-10-CM | POA: Diagnosis not present

## 2021-05-04 DIAGNOSIS — E782 Mixed hyperlipidemia: Secondary | ICD-10-CM

## 2021-05-04 DIAGNOSIS — F418 Other specified anxiety disorders: Secondary | ICD-10-CM

## 2021-05-04 DIAGNOSIS — Z23 Encounter for immunization: Secondary | ICD-10-CM | POA: Diagnosis not present

## 2021-05-04 DIAGNOSIS — F4310 Post-traumatic stress disorder, unspecified: Secondary | ICD-10-CM | POA: Diagnosis not present

## 2021-05-04 DIAGNOSIS — T7491XD Unspecified adult maltreatment, confirmed, subsequent encounter: Secondary | ICD-10-CM | POA: Diagnosis not present

## 2021-05-04 MED ORDER — DIAZEPAM 5 MG PO TABS
5.0000 mg | ORAL_TABLET | Freq: Four times a day (QID) | ORAL | 3 refills | Status: DC | PRN
  Filled 2021-05-04: qty 30, 8d supply, fill #0

## 2021-05-04 NOTE — Assessment & Plan Note (Signed)
Encourage heart healthy diet such as MIND or DASH diet, increase exercise, avoid trans fats, simple carbohydrates and processed foods, consider a krill or fish or flaxseed oil cap daily.  °
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
## 2021-05-04 NOTE — Assessment & Plan Note (Addendum)
Encouraged good sleep hygiene such as dark, quiet room. No blue/green glowing lights such as computer screens in bedroom. No alcohol or stimulants in evening. Cut down on caffeine as able. Regular exercise is helpful but not just prior to bed time. May use Diazepam 5 mg qhs prn

## 2021-05-04 NOTE — Patient Instructions (Signed)
Paxlovid or Molnupiravir are the new COVID medication we can give you if you get COVID so make sure you test if you have symptoms because we have to treat by day 5 of symptoms for it to be effective. If you are positive let us know so we can treat. If a home test is negative and your symptoms are persistent get a PCR test. Can check testing locations at [email or web address] If you are positive we will make an appointment with [place] and we will send in Paxlovid and Molnupiravirif you would like it. Check with your pharmacy before w e meet to confirm they have it in stock, if they do not then we can get the prescription at the [facility]   Insomnia Insomnia is a sleep disorder that makes it difficult to fall asleep or stay asleep. Insomnia can cause fatigue, low energy, difficulty concentrating, mood swings, and poor performance at work or school. There are three different ways to classify insomnia: Difficulty falling asleep. Difficulty staying asleep. Waking up too early in the morning. Any type of insomnia can be long-term (chronic) or short-term (acute). Both are common. Short-term insomnia usually lasts for three months or less. Chronic insomnia occurs at least three times a week for longer than three months. What are the causes? Insomnia may be caused by another condition, situation, or substance, such as: Anxiety. Certain medicines. Gastroesophageal reflux disease (GERD) or other gastrointestinal conditions. Asthma or other breathing conditions. Restless legs syndrome, sleep apnea, or other sleep disorders. Chronic pain. Menopause. Stroke. Abuse of alcohol, tobacco, or illegal drugs. Mental health conditions, such as depression. Caffeine. Neurological disorders, such as Alzheimer's disease. An overactive thyroid (hyperthyroidism). Sometimes, the cause of insomnia may not be known. What increases the risk? Risk factors for insomnia include: Gender. Women are affected more  often than men. Age. Insomnia is more common as you get older. Stress. Lack of exercise. Irregular work schedule or working night shifts. Traveling between different time zones. Certain medical and mental health conditions. What are the signs or symptoms? If you have insomnia, the main symptom is having trouble falling asleep or having trouble staying asleep. This may lead to other symptoms, such as: Feeling fatigued or having low energy. Feeling nervous about going to sleep. Not feeling rested in the morning. Having trouble concentrating. Feeling irritable, anxious, or depressed. How is this diagnosed? This condition may be diagnosed based on: Your symptoms and medical history. Your health care provider may ask about: Your sleep habits. Any medical conditions you have. Your mental health. A physical exam. How is this treated? Treatment for insomnia depends on the cause. Treatment may focus on treating an underlying condition that is causing insomnia. Treatment may also include: Medicines to help you sleep. Counseling or therapy. Lifestyle adjustments to help you sleep better. Follow these instructions at home: Eating and drinking  Limit or avoid alcohol, caffeinated beverages, and cigarettes, especially close to bedtime. These can disrupt your sleep. Do not eat a large meal or eat spicy foods right before bedtime. This can lead to digestive discomfort that can make it hard for you to sleep. Sleep habits  Keep a sleep diary to help you and your health care provider figure out what could be causing your insomnia. Write down: When you sleep. When you wake up during the night. How well you sleep. How rested you feel the next day. Any side effects of medicines you are taking. What you eat and drink. Make your bedroom a dark,  comfortable place where it is easy to fall asleep. Put up shades or blackout curtains to block light from outside. Use a white noise machine to block  noise. Keep the temperature cool. Limit screen use before bedtime. This includes: Watching TV. Using your smartphone, tablet, or computer. Stick to a routine that includes going to bed and waking up at the same times every day and night. This can help you fall asleep faster. Consider making a quiet activity, such as reading, part of your nighttime routine. Try to avoid taking naps during the day so that you sleep better at night. Get out of bed if you are still awake after 15 minutes of trying to sleep. Keep the lights down, but try reading or doing a quiet activity. When you feel sleepy, go back to bed. General instructions Take over-the-counter and prescription medicines only as told by your health care provider. Exercise regularly, as told by your health care provider. Avoid exercise starting several hours before bedtime. Use relaxation techniques to manage stress. Ask your health care provider to suggest some techniques that may work well for you. These may include: Breathing exercises. Routines to release muscle tension. Visualizing peaceful scenes. Make sure that you drive carefully. Avoid driving if you feel very sleepy. Keep all follow-up visits as told by your health care provider. This is important. Contact a health care provider if: You are tired throughout the day. You have trouble in your daily routine due to sleepiness. You continue to have sleep problems, or your sleep problems get worse. Get help right away if: You have serious thoughts about hurting yourself or someone else. If you ever feel like you may hurt yourself or others, or have thoughts about taking your own life, get help right away. You can go to your nearest emergency department or call: Your local emergency services (911 in the [place] A suicide crisis helpline, such as the [facility] at 9196127490. This is open 24 hours a day. Summary Insomnia is a sleep disorder that makes it  difficult to fall asleep or stay asleep. Insomnia can be long-term (chronic) or short-term (acute). Treatment for insomnia depends on the cause. Treatment may focus on treating an underlying condition that is causing insomnia. Keep a sleep diary to help you and your health care provider figure out what could be causing your insomnia. This information is not intended to replace advice given to you by your health care provider. Make sure you discuss any questions you have with your health care provider. Document Revised: 05/08/2020 Document Reviewed: 05/08/2020 Elsevier Patient Education  2022 [employer].

## 2021-05-04 NOTE — Assessment & Plan Note (Signed)
hgba1c acceptable, minimize simple carbs. Increase exercise as tolerated.  
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
## 2021-05-06 ENCOUNTER — Other Ambulatory Visit (INDEPENDENT_AMBULATORY_CARE_PROVIDER_SITE_OTHER): Payer: Medicare Other

## 2021-05-06 ENCOUNTER — Other Ambulatory Visit: Payer: Self-pay

## 2021-05-06 DIAGNOSIS — E782 Mixed hyperlipidemia: Secondary | ICD-10-CM

## 2021-05-06 DIAGNOSIS — R739 Hyperglycemia, unspecified: Secondary | ICD-10-CM

## 2021-05-06 LAB — LIPID PANEL
Cholesterol: 151 mg/dL (ref 0–200)
HDL: 46.4 mg/dL (ref >39.00)
LDL Cholesterol: 66 mg/dL (ref 0–99)
NonHDL: 104.45
Total CHOL/HDL Ratio: 3
Triglycerides: 193 mg/dL — ABNORMAL HIGH (ref 0.0–149.0)
VLDL: 38.6 mg/dL (ref 0.0–40.0)

## 2021-05-06 LAB — CBC WITH DIFFERENTIAL/PLATELET
Basophils Absolute: 0 10*3/uL (ref 0.0–0.1)
Basophils Relative: 0.6 % (ref 0.0–3.0)
Eosinophils Absolute: 0.1 10*3/uL (ref 0.0–0.7)
Eosinophils Relative: 1.7 % (ref 0.0–5.0)
HCT: 45.8 % (ref 36.0–46.0)
Hemoglobin: 14.5 g/dL (ref 12.0–15.0)
Lymphocytes Relative: 34.4 % (ref 12.0–46.0)
Lymphs Abs: 2 10*3/uL (ref 0.7–4.0)
MCHC: 31.8 g/dL (ref 30.0–36.0)
MCV: 89.7 fl (ref 78.0–100.0)
Monocytes Absolute: 0.6 10*3/uL (ref 0.1–1.0)
Monocytes Relative: 9.5 % (ref 3.0–12.0)
Neutro Abs: 3.2 10*3/uL (ref 1.4–7.7)
Neutrophils Relative %: 53.8 % (ref 43.0–77.0)
Platelets: 223 10*3/uL (ref 150.0–400.0)
RBC: 5.1 Mil/uL (ref 3.87–5.11)
RDW: 13.1 % (ref 11.5–15.5)
WBC: 6 10*3/uL (ref 4.0–10.5)

## 2021-05-06 LAB — COMPREHENSIVE METABOLIC PANEL
ALT: 22 U/L (ref 0–35)
AST: 19 U/L (ref 0–37)
Albumin: 4.8 g/dL (ref 3.5–5.2)
Alkaline Phosphatase: 52 U/L (ref 39–117)
BUN: 11 mg/dL (ref 6–23)
CO2: 28 mEq/L (ref 19–32)
Calcium: 9.6 mg/dL (ref 8.4–10.5)
Chloride: 105 mEq/L (ref 96–112)
Creatinine, Ser: 0.75 mg/dL (ref 0.40–1.20)
GFR: 84.78 mL/min (ref >60.00)
Glucose, Bld: 82 mg/dL (ref 70–99)
Potassium: 4.3 mEq/L (ref 3.5–5.1)
Sodium: 141 mEq/L (ref 135–145)
Total Bilirubin: 1 mg/dL (ref 0.2–1.2)
Total Protein: 7 g/dL (ref 6.0–8.3)

## 2021-05-06 LAB — HEMOGLOBIN A1C: Hgb A1c MFr Bld: 5.4 % (ref 4.6–6.5)

## 2021-05-06 LAB — TSH: TSH: 1.05 u[IU]/mL (ref 0.35–5.50)

## 2021-05-07 ENCOUNTER — Telehealth: Payer: Self-pay | Admitting: Family Medicine

## 2021-05-07 NOTE — Telephone Encounter (Signed)
[facility], and they do not accept her insurance and are currently not accepting new patients. She would like some one that specializes in ptsd. Please advise.

## 2021-05-08 NOTE — Telephone Encounter (Signed)
Pt called back regarding referral. Pt stated she would like Charlett Blake to return her phone call it would make her feel much better.

## 2021-05-08 NOTE — Telephone Encounter (Signed)
Patient will use her behavioral health sheet that she was given.

## 2021-05-08 NOTE — Telephone Encounter (Signed)
Do you have any recommendations?

## 2021-05-12 ENCOUNTER — Other Ambulatory Visit: Payer: Medicare Other

## 2021-05-18 ENCOUNTER — Other Ambulatory Visit: Payer: Self-pay | Admitting: Family Medicine

## 2021-05-18 ENCOUNTER — Other Ambulatory Visit (HOSPITAL_BASED_OUTPATIENT_CLINIC_OR_DEPARTMENT_OTHER): Payer: Self-pay

## 2021-05-18 MED ORDER — POTASSIUM CHLORIDE CRYS ER 20 MEQ PO TBCR
EXTENDED_RELEASE_TABLET | ORAL | 1 refills | Status: DC
  Filled 2021-05-18: qty 100, 50d supply, fill #0
  Filled 2021-08-28: qty 100, 50d supply, fill #1

## 2021-05-18 MED ORDER — OMEPRAZOLE 40 MG PO CPDR
DELAYED_RELEASE_CAPSULE | Freq: Every day | ORAL | 1 refills | Status: DC
  Filled 2021-05-18: qty 90, 90d supply, fill #0

## 2021-05-19 ENCOUNTER — Encounter (HOSPITAL_COMMUNITY): Payer: Self-pay | Admitting: Registered Nurse

## 2021-05-19 ENCOUNTER — Ambulatory Visit (HOSPITAL_COMMUNITY)
Admission: EM | Admit: 2021-05-19 | Discharge: 2021-05-19 | Disposition: A | Discharge status: Home / Self Care | Payer: Medicare Other | Attending: Registered Nurse | Admitting: Registered Nurse

## 2021-05-19 DIAGNOSIS — F4323 Adjustment disorder with mixed anxiety and depressed mood: Secondary | ICD-10-CM | POA: Diagnosis not present

## 2021-05-19 DIAGNOSIS — F411 Generalized anxiety disorder: Secondary | ICD-10-CM | POA: Diagnosis present

## 2021-05-19 DIAGNOSIS — F4310 Post-traumatic stress disorder, unspecified: Secondary | ICD-10-CM | POA: Diagnosis not present

## 2021-05-19 DIAGNOSIS — F22 Delusional disorders: Secondary | ICD-10-CM | POA: Insufficient documentation

## 2021-05-19 NOTE — Discharge Summary (Signed)
[name] to be D/C'd Home per NP order. Discussed with the patient and all questions fully answered. An After Visit Summary was printed and given to the patient. Patient escorted out and D/C home via private auto.  [name]  05/19/2021 3:03 PM

## 2021-05-19 NOTE — Progress Notes (Signed)
   05/19/21 1300  [place] (Walk-ins at [facility] only)  What Is the Reason for Your Visit/Call Today? Paranoia, Hearing voices  How Long Has This Been Causing You Problems? 1 wk - 1 month  Have You Recently Had Any Thoughts About Hurting Yourself? No  Are You Planning to Commit Suicide/Harm Yourself At This time? No  Have you Recently Had Thoughts About [place]? No  Are You Planning To Harm Someone At This Time? No  Are you currently experiencing any auditory, visual or other hallucinations? Yes  Please explain the hallucinations you are currently experiencing: Pt reports hearing voices, whispers and afraid of neighbors.  Have You Used Any Alcohol or Drugs in the Past 24 Hours? Yes  How long ago did you use Drugs or Alcohol? 05/18/21  What Did You Use and How Much? wine, glass  Clinician description of patient physical appearance/behavior: cooperative  What Do You Feel Would Help You the Most Today? Treatment for Depression or other mood problem  If access to [facility] was not available, would you have sought care in the Emergency Department? Yes  Determination of Need Urgent (48 hours)  Options For Referral Facility-Based Crisis;Medication Management

## 2021-05-19 NOTE — Discharge Instructions (Addendum)
Outpatient treatment has been recommended.   [place] - [facility] - Offer in person (in home) and virtual appointments offered.  Address: [address] [place], [place], [place] [address] Phone: [phone]  [place]: Psychiatry and therapy offered Address: [place], [place], [place] [address] Phone: [phone]  [facility] - offers psychiatry, therapy and intensive outpatient therapy Address: [address] [place], [place], [place] [address] Phone: [phone]   Below are a list of other outpatient psychiatric providers that offer therapy and medication management Please Follow up with Outpatient Services  [facility] (Therapy only) (takes Medicaid and most major insurances) [place]:  [address] [place], [place] [address] Phone: [phone] [place]:  [address], [place], [place] [address]   Phone: [phone] [place]:  [address], [place], [place] [address]  Phone: [phone]  [place]:   (will take occasional Medicaid. Takes most major insurances in network) [place]: [place] [address], [place]: [address], [place] PJSRP:[phone] [place]: [place] Phone: [phone] [place]: [place], [place] Phone: [place]  (takes sliding scale and Medicaid as well as other major insurances)  [place]:   [address] [place] [address]  Phone: [phone] [place]:   [facility] [address], [place], [place] [address]   Phone: [phone]  [facility]  (medication, therapy, intensive in home services)  [address] [place] [address] [place], [place] [address] [phone]  [facility] (Therapy only)  Specializes in [place], perinatal mood disorders, anxiety and  depression [address] [place], [place] [address] [place] (MST Intensive in-home services) [place], [place] [address] [place]  (intensive in home services) [address] [place], [place] [address] [place]  (medication and therapy) [address], [place], [place] [address] [place], [place] [address] [phone]  [facility] [address] [place], [place] [address] Website:  [email or web address] Phone: [phone] Fax: [phone]  [facility] (medication) [place], [place], [place] [address] Phone: [phone]  [facility] (intensive in-home, medication management) [place], [place] [address] [phone]  [facility]  (specializes in trauma therapy) [address] [place]  [place], [place] [address] [place]  [facility] [facility] (Psychological testing) [place] [address],  [place], [place] [address] [phone]  [facility]  [address] [place], [place] [address] Phone:  [phone] Website:  [email or web address]

## 2021-05-19 NOTE — BH Assessment (Signed)
[name], Urgent, MR #[id number]; 65 years old presents this date with her friend Talbot Grumbling, [phone].  Pt reports hearing voices,  and whispers, "I am thinking that my neighbors are trying to hurt me". Pt denied SI, and HI.  Pt admits to piror MH diagnosis or prescribed medication for smptom management.  MSE signed by patient.
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
## 2021-05-23 ENCOUNTER — Ambulatory Visit (HOSPITAL_COMMUNITY)
Admission: EM | Admit: 2021-05-23 | Discharge: 2021-05-24 | Disposition: A | Discharge status: Home / Self Care | Payer: Medicare Other | Attending: Family | Admitting: Family

## 2021-05-23 DIAGNOSIS — Z91128 Patient's intentional underdosing of medication regimen for other reason: Secondary | ICD-10-CM | POA: Insufficient documentation

## 2021-05-23 DIAGNOSIS — F4310 Post-traumatic stress disorder, unspecified: Secondary | ICD-10-CM | POA: Insufficient documentation

## 2021-05-23 DIAGNOSIS — Z20822 Contact with and (suspected) exposure to covid-19: Secondary | ICD-10-CM | POA: Insufficient documentation

## 2021-05-23 DIAGNOSIS — F4322 Adjustment disorder with anxiety: Secondary | ICD-10-CM | POA: Insufficient documentation

## 2021-05-23 DIAGNOSIS — F32A Depression, unspecified: Secondary | ICD-10-CM | POA: Insufficient documentation

## 2021-05-23 DIAGNOSIS — Z79899 Other long term (current) drug therapy: Secondary | ICD-10-CM | POA: Diagnosis not present

## 2021-05-23 DIAGNOSIS — T424X6A Underdosing of benzodiazepines, initial encounter: Secondary | ICD-10-CM | POA: Insufficient documentation

## 2021-05-23 DIAGNOSIS — F22 Delusional disorders: Secondary | ICD-10-CM

## 2021-05-23 DIAGNOSIS — R443 Hallucinations, unspecified: Secondary | ICD-10-CM

## 2021-05-23 DIAGNOSIS — F419 Anxiety disorder, unspecified: Secondary | ICD-10-CM | POA: Insufficient documentation

## 2021-05-23 LAB — COMPREHENSIVE METABOLIC PANEL
ALT: 23 U/L (ref 0–44)
AST: 22 U/L (ref 15–41)
Albumin: 4.5 g/dL (ref 3.5–5.0)
Alkaline Phosphatase: 41 U/L (ref 38–126)
Anion gap: 7 (ref 5–15)
BUN: 6 mg/dL — ABNORMAL LOW (ref 8–23)
CO2: 26 mmol/L (ref 22–32)
Calcium: 9.3 mg/dL (ref 8.9–10.3)
Chloride: 104 mmol/L (ref 98–111)
Creatinine, Ser: 0.68 mg/dL (ref 0.44–1.00)
GFR, Estimated: >60 mL/min (ref >60)
Glucose, Bld: 100 mg/dL — ABNORMAL HIGH (ref 70–99)
Potassium: 4.1 mmol/L (ref 3.5–5.1)
Sodium: 137 mmol/L (ref 135–145)
Total Bilirubin: 1.2 mg/dL (ref 0.3–1.2)
Total Protein: 7.2 g/dL (ref 6.5–8.1)

## 2021-05-23 LAB — RESP PANEL BY RT-PCR (FLU A&B, COVID) ARPGX2
Influenza A by PCR: NEGATIVE
Influenza B by PCR: NEGATIVE
SARS Coronavirus 2 by RT PCR: NEGATIVE

## 2021-05-23 LAB — CBC WITH DIFFERENTIAL/PLATELET
Abs Immature Granulocytes: 0.04 10*3/uL (ref 0.00–0.07)
Basophils Absolute: 0 10*3/uL (ref 0.0–0.1)
Basophils Relative: 1 %
Eosinophils Absolute: 0 10*3/uL (ref 0.0–0.5)
Eosinophils Relative: 1 %
HCT: 44.7 % (ref 36.0–46.0)
Hemoglobin: 14.4 g/dL (ref 12.0–15.0)
Immature Granulocytes: 1 %
Lymphocytes Relative: 24 %
Lymphs Abs: 1.6 10*3/uL (ref 0.7–4.0)
MCH: 28.9 pg (ref 26.0–34.0)
MCHC: 32.2 g/dL (ref 30.0–36.0)
MCV: 89.8 fL (ref 80.0–100.0)
Monocytes Absolute: 0.4 10*3/uL (ref 0.1–1.0)
Monocytes Relative: 6 %
Neutro Abs: 4.6 10*3/uL (ref 1.7–7.7)
Neutrophils Relative %: 67 %
Platelets: 257 10*3/uL (ref 150–400)
RBC: 4.98 MIL/uL (ref 3.87–5.11)
RDW: 12.4 % (ref 11.5–15.5)
WBC: 6.8 10*3/uL (ref 4.0–10.5)
nRBC: 0 % (ref 0.0–0.2)

## 2021-05-23 LAB — POCT URINE DRUG SCREEN - MANUAL ENTRY (I-SCREEN)
POC Amphetamine UR: NOT DETECTED
POC Buprenorphine (BUP): NOT DETECTED
POC Cocaine UR: NOT DETECTED
POC Marijuana UR: NOT DETECTED
POC Methadone UR: NOT DETECTED
POC Methamphetamine UR: NOT DETECTED
POC Morphine: NOT DETECTED
POC Oxazepam (BZO): POSITIVE — AB
POC Oxycodone UR: NOT DETECTED
POC Secobarbital (BAR): NOT DETECTED

## 2021-05-23 LAB — POC SARS CORONAVIRUS 2 AG -  ED: SARS Coronavirus 2 Ag: NEGATIVE

## 2021-05-23 LAB — LIPID PANEL
Cholesterol: 129 mg/dL (ref 0–200)
HDL: 49 mg/dL (ref >40)
LDL Cholesterol: 59 mg/dL (ref 0–99)
Total CHOL/HDL Ratio: 2.6 RATIO
Triglycerides: 104 mg/dL (ref <150)
VLDL: 21 mg/dL (ref 0–40)

## 2021-05-23 LAB — TSH: TSH: 0.934 u[IU]/mL (ref 0.350–4.500)

## 2021-05-23 LAB — POC SARS CORONAVIRUS 2 AG: SARSCOV2ONAVIRUS 2 AG: NEGATIVE

## 2021-05-23 MED ORDER — TRAZODONE HCL 50 MG PO TABS
50.0000 mg | ORAL_TABLET | Freq: Every evening | ORAL | Status: DC | PRN
  Administered 2021-05-23: 50 mg via ORAL
  Filled 2021-05-23: qty 1

## 2021-05-23 MED ORDER — LORAZEPAM 1 MG PO TABS
2.0000 mg | ORAL_TABLET | Freq: Once | ORAL | Status: AC
  Administered 2021-05-23: 2 mg via ORAL
  Filled 2021-05-23: qty 2

## 2021-05-23 MED ORDER — ALUM & MAG HYDROXIDE-SIMETH 200-200-20 MG/5ML PO SUSP: 30.0000 mL | ORAL | Status: DC | PRN

## 2021-05-23 MED ORDER — QUETIAPINE FUMARATE 50 MG PO TABS
50.0000 mg | ORAL_TABLET | Freq: Every day | ORAL | Status: DC
  Administered 2021-05-23: 50 mg via ORAL
  Filled 2021-05-23: qty 1

## 2021-05-23 MED ORDER — MAGNESIUM HYDROXIDE 400 MG/5ML PO SUSP: 30.0000 mL | Freq: Every day | ORAL | Status: DC | PRN

## 2021-05-23 MED ORDER — ACETAMINOPHEN 325 MG PO TABS
650.0000 mg | ORAL_TABLET | Freq: Four times a day (QID) | ORAL | Status: DC | PRN
  Administered 2021-05-23: 650 mg via ORAL
  Filled 2021-05-23: qty 2

## 2021-05-23 MED ORDER — QUETIAPINE FUMARATE 25 MG PO TABS
25.0000 mg | ORAL_TABLET | Freq: Two times a day (BID) | ORAL | Status: DC
  Administered 2021-05-23 – 2021-05-24 (×3): 25 mg via ORAL
  Filled 2021-05-23 (×3): qty 1

## 2021-05-23 MED ORDER — HYDROXYZINE HCL 25 MG PO TABS
25.0000 mg | ORAL_TABLET | Freq: Three times a day (TID) | ORAL | Status: DC | PRN
  Administered 2021-05-23 (×2): 25 mg via ORAL
  Filled 2021-05-23 (×2): qty 1

## 2021-05-23 NOTE — Discharge Instructions (Addendum)
Take all medications as prescribed. Keep all follow-up appointments as scheduled.  Do not consume alcohol or use illegal drugs while on prescription medications. Report any adverse effects from your medications to your primary care provider promptly.  In the event of recurrent symptoms or worsening symptoms, call 911, a crisis hotline, or go to the nearest emergency department for evaluation.   

## 2021-05-23 NOTE — ED Notes (Signed)
Patient unsettled, agitated and anxious.

## 2021-05-23 NOTE — Progress Notes (Signed)
Pt was observed talking loudly to self. Pt was tearful when writer asked if she was ok. Pt responded that she wants her friend who visited to be ok. Pt was informed that no one visited her on the unit.  Pt was encouraged to get some rest. Pt complained that her head "feels warm to touch." Temperature checked and it was 98.3. PRN Vistaril given for anxiety. Presently resting quietly. Will continue to monitor for pt's safety.

## 2021-05-23 NOTE — ED Provider Notes (Addendum)
Behavioral Health Admission H&P [facility] & [facility]BS)  Date: 05/23/21 Patient Name: [name] MRN: [id number] Chief Complaint:  Chief Complaint  Patient presents with   Evaluation      Diagnoses:  Final diagnoses:  Hallucination  Paranoia [facility])    HPI: History of Present illness: [name] is a 65 y.o. female.  Presents to [facility] urgent care accompanied by [facility] Department ( [facility]).  Was reported patient had called [facility] multiple times throughout the night due to paranoia.  She reports auditory hallucinations.  States she has not slept for the past 10 days and feels as if her mood and depression is getting worse.  Denying suicidal or homicidal ideations.  Denies auditory or visual hallucinations.  Chart review patient is prescribed Valium 5 mg p.o. twice daily as needed which she reports she has not been taking medications as directed.  [name] reports she resides with her dog, however has a son that is nearby.  States chronic hallucinations. Denied voices are command in nature.  Stated she was prescribed Risperdal however stopped taking this medication as she did not feel it was helping.  States she is followed by psychiatrist [name] but has not been to see her in quite some time.  States her primary care provider currently rates for her evaluation.  Discussed initiating Seroquel 25 mg p.o. twice daily and 50 mg p.o. nightly patient was receptive to plan.  Patient was recently seen and evaluated for similar symptoms.  [facility].  Patient for overnight observation.  Will restart home medications where appropriate.  Support, encouragement  and reassurance was   PHQ 2-9:  [employer] Visit from 06/24/2020 in [facility] [place] Visit from 02/22/2020 in [facility] [place] Visit from 09/01/2016 in [place]  Thoughts that you would be better off dead, or of hurting yourself in some way Not at all Not at all Not at all  PHQ-9 Total Score 5 6 2        [place] [facility] from 05/23/2021 in [place] No Risk        Total Time spent with patient: 15 minutes  Musculoskeletal  Strength & Muscle Tone: within normal limits Gait & Station: normal Patient leans: N/A  Psychiatric Specialty Exam  Presentation General Appearance: Appropriate for Environment  Eye Contact:Good  Speech:Clear and Coherent; Normal Rate  Speech Volume:Normal  Handedness:Right   Mood and Affect  Mood:Anxious; Dysphoric  Affect:Congruent   Thought Process  Thought Processes:Coherent; Goal Directed  Descriptions of Associations:Intact  Orientation:Full (Time, Place and Person)  Thought Content:WDL  Diagnosis of Schizophrenia or Schizoaffective disorder in past: No  Duration of Psychotic Symptoms: Less than six months  Hallucinations:No data recorded Ideas of Reference:Paranoia  Suicidal Thoughts:No data recorded Homicidal Thoughts:No data recorded  Sensorium  Memory:Immediate Good; Recent Good  Judgment:Intact  Insight:No data recorded  Executive Functions  Concentration:Good  Attention Span:Good  [place] of Knowledge:Good  Language:Good   Psychomotor Activity  Psychomotor Activity:No data recorded  Assets  Assets:Communication Skills; Desire for Improvement; Financial Resources/Insurance; Housing; Resilience; Social Support; Transportation   Sleep  Sleep:No data recorded  No data recorded  Physical Exam Vitals and nursing note reviewed.  HENT:     Head: Normocephalic.  Cardiovascular:     Rate and Rhythm:  Normal rate and regular rhythm.  Neurological:     Mental Status: She is oriented to person, place, and time.  Psychiatric:        Attention and Perception:  Attention and perception normal.        Mood and Affect: Mood normal.        Speech: Speech normal.        Behavior: Behavior normal. Behavior is cooperative.        Thought Content: Thought content is paranoid. Thought content does not include suicidal ideation. Thought content does not include homicidal plan.        Cognition and Memory: Cognition and memory normal.        Judgment: Judgment normal.   Review of Systems  HENT: Negative.    Cardiovascular: Negative.   Gastrointestinal: Negative.   Genitourinary: Negative.   Endo/Heme/Allergies: Negative.   Psychiatric/Behavioral:  Positive for depression and hallucinations. Negative for substance abuse and suicidal ideas. The patient is nervous/anxious.   All other systems reviewed and are negative.  Blood pressure 126/70, pulse 89, temperature 98.8 F (37.1 C), temperature source Oral, resp. rate 18, last menstrual period 10/03/2012, SpO2 96 %. There is no height or weight on file to calculate BMI.  Past Psychiatric History: Charted history with depression and anxiety, PTSD and adjustment disorder with mixed anxiety and mood  Is the patient at risk to self? No  Has the patient been a risk to self in the past 6 months? No .    Has the patient been a risk to self within the distant past? No   Is the patient a risk to others? No   Has the patient been a risk to others in the past 6 months? No   Has the patient been a risk to others within the distant past? No   Past Medical History:  Past Medical History:  Diagnosis Date   Acute sinusitis 03/27/2013   Allergy    seasonal   Anxiety    Arthritis 06/09/2013   Depression    Dyspareunia    Edema extremities    Encounter for preventative adult health care exam with abnormal findings 04/21/2014   GERD (gastroesophageal reflux disease)    Hip pain, left 11/01/2011   History of proteinuria syndrome    Hyperlipidemia    Insomnia 03/11/2013   Low back pain 05/09/2011   Lymphadenitis  06/16/2012   Myalgia and myositis 02/15/2015   Obese    Onychomycosis 05/09/2011   Oral lesion 08/26/2013   Otitis externa 06/23/2012   left   Perimenopause 03/11/2013   RLS (restless legs syndrome)    RUQ pain 09/20/2011   Sleep apnea 04/21/2014   Tinea corporis 04/26/2017   Unspecified constipation 05/13/2013   Urinary frequency 08/26/2013    Past Surgical History:  Procedure Laterality Date   BACK SURGERY  2006   low, discectomy for herniated disc   BLADDER SUSPENSION N/A 11/07/2012   Procedure: [facility] SLING;  Surgeon: Malka So, MD;  Location[facility];  Service: Urology;  Laterality: N/A;   CARPAL TUNNEL RELEASE Right 2006   CESAREAN SECTION  1993   CYSTOSCOPY N/A 11/07/2012   Procedure: CYSTOSCOPY;  Surgeon: Malka So, MD;  Location: [facility];  Service: Urology;  Laterality: N/A;   TUBAL LIGATION  1993    Family History:  Family History  Problem Relation Age of Onset   Cancer Mother 54       thyroid, malignant brain  tumor   Arthritis Mother        neck, back   Hepatitis Sister        Hepatitis C   Heart disease Sister        stent   Diabetes Maternal Grandmother    Arthritis Father        back pain   Heart disease Father        stent   Breast cancer Maternal Aunt     Social History:  Social History   Socioeconomic History   Marital status: Married    Spouse name: Not on file   Number of children: Not on file   Years of education: Not on file   Highest education level: Not on file  Occupational History   Not on file  Tobacco Use   Smoking status: Former    Packs/day: 0.50    Years: 20.00    Pack years: 10.00    Types: Cigarettes    Quit date: 07/12/1992    Years since quitting: 28.8   Smokeless tobacco: Never  Substance and Sexual Activity   Alcohol use: Yes    Comment: occasionaly   Drug use: No   Sexual activity: Yes    Partners: Male  Other Topics Concern   Not on file  Social History Narrative   Not on  file   Social Determinants of Health   Financial Resource Strain: Not on file  Food Insecurity: Not on file  Transportation Needs: Not on file  Physical Activity: Not on file  Stress: Not on file  Social Connections: Not on file  Intimate Partner Violence: Not on file    SDOH:  SDOH Screenings   Alcohol Screen: Not on file  Depression (PHQ2-9): Medium Risk   PHQ-2 Score: 5  Financial Resource Strain: Not on file  Food Insecurity: Not on file  Housing: Not on file  Physical Activity: Not on file  Social Connections: Not on file  Stress: Not on file  Tobacco Use: Medium Risk   Smoking Tobacco Use: Former   Smokeless Tobacco Use: Never   Passive Exposure: Not on file  Transportation Needs: Not on file    Last Labs:  Admission on 05/23/2021  Component Date Value Ref Range Status   SARS Coronavirus 2 by RT PCR 05/23/2021 NEGATIVE  NEGATIVE Final   Comment: (NOTE) SARS-CoV-2 target nucleic acids are NOT DETECTED.  The SARS-CoV-2 RNA is generally detectable in upper respiratory specimens during the acute phase of infection. The lowest concentration of SARS-CoV-2 viral copies this assay can detect is 138 copies/mL. A negative result does not preclude SARS-Cov-2 infection and should not be used as the sole basis for treatment or other patient management decisions. A negative result may occur with  improper specimen collection/handling, submission of specimen other than nasopharyngeal swab, presence of viral mutation(s) within the areas targeted by this assay, and inadequate number of viral copies(<138 copies/mL). A negative result must be combined with clinical observations, patient history, and epidemiological information. The expected result is Negative.  Fact Sheet for Patients:  EntrepreneurPulse.com.au  Fact Sheet for Healthcare Providers:  IncredibleEmployment.be  This test is no                          t yet approved or  cleared by the [place] FDA and  has been authorized for detection and/or diagnosis of SARS-CoV-2 by FDA under an Emergency Use Authorization (EUA). This EUA will remain  in effect (meaning this test can be used) for the duration of the COVID-19 declaration under Section 564(b)(1) of the Act, 21 U.S.C.section 360bbb-3(b)(1), unless the authorization is terminated  or revoked sooner.       Influenza A by PCR 05/23/2021 NEGATIVE  NEGATIVE Final   Influenza B by PCR 05/23/2021 NEGATIVE  NEGATIVE Final   Comment: (NOTE) The Xpert Xpress SARS-CoV-2/FLU/RSV plus assay is intended as an aid in the diagnosis of influenza from Nasopharyngeal swab specimens and should not be used as a sole basis for treatment. Nasal washings and aspirates are unacceptable for Xpert Xpress SARS-CoV-2/FLU/RSV testing.  Fact Sheet for Patients: EntrepreneurPulse.com.au  Fact Sheet for Healthcare Providers: IncredibleEmployment.be  This test is not yet approved or cleared by the [place] FDA and has been authorized for detection and/or diagnosis of SARS-CoV-2 by FDA under an Emergency Use Authorization (EUA). This EUA will remain in effect (meaning this test can be used) for the duration of the COVID-19 declaration under Section 564(b)(1) of the Act, 21 U.S.C. section 360bbb-3(b)(1), unless the authorization is terminated or revoked.  Performed at [facility], [place] [address] [place], [place] [address]    WBC 05/23/2021 6.8  4.0 - 10.5 K/uL Final   RBC 05/23/2021 4.98  3.87 - 5.11 MIL/uL Final   Hemoglobin 05/23/2021 14.4  12.0 - 15.0 g/dL Final   HCT 05/23/2021 44.7  36.0 - 46.0 % Final   MCV 05/23/2021 89.8  80.0 - 100.0 fL Final   MCH 05/23/2021 28.9  26.0 - 34.0 pg Final   MCHC 05/23/2021 32.2  30.0 - 36.0 g/dL Final   RDW 05/23/2021 12.4  11.5 - 15.5 % Final   Platelets 05/23/2021 257  150 - 400 K/uL Final   nRBC 05/23/2021 0.0  0.0 - 0.2 % Final    Neutrophils Relative % 05/23/2021 67  % Final   Neutro Abs 05/23/2021 4.6  1.7 - 7.7 K/uL Final   Lymphocytes Relative 05/23/2021 24  % Final   Lymphs Abs 05/23/2021 1.6  0.7 - 4.0 K/uL Final   Monocytes Relative 05/23/2021 6  % Final   Monocytes Absolute 05/23/2021 0.4  0.1 - 1.0 K/uL Final   Eosinophils Relative 05/23/2021 1  % Final   Eosinophils Absolute 05/23/2021 0.0  0.0 - 0.5 K/uL Final   Basophils Relative 05/23/2021 1  % Final   Basophils Absolute 05/23/2021 0.0  0.0 - 0.1 K/uL Final   Immature Granulocytes 05/23/2021 1  % Final   Abs Immature Granulocytes 05/23/2021 0.04  0.00 - 0.07 K/uL Final   Performed at [facility], [place] [address], [place], [place] [address]   Sodium 05/23/2021 137  135 - 145 mmol/L Final   Potassium 05/23/2021 4.1  3.5 - 5.1 mmol/L Final   Chloride 05/23/2021 104  98 - 111 mmol/L Final   CO2 05/23/2021 26  22 - 32 mmol/L Final   Glucose, Bld 05/23/2021 100 (A)  70 - 99 mg/dL Final   Glucose reference range applies only to samples taken after fasting for at least 8 hours.   BUN 05/23/2021 6 (A)  8 - 23 mg/dL Final   Creatinine, Ser 05/23/2021 0.68  0.44 - 1.00 mg/dL Final   Calcium 05/23/2021 9.3  8.9 - 10.3 mg/dL Final   Total Protein 05/23/2021 7.2  6.5 - 8.1 g/dL Final   Albumin 05/23/2021 4.5  3.5 - 5.0 g/dL Final   AST 05/23/2021 22  15 - 41 U/L Final  ALT 05/23/2021 23  0 - 44 U/L Final   Alkaline Phosphatase 05/23/2021 41  38 - 126 U/L Final   Total Bilirubin 05/23/2021 1.2  0.3 - 1.2 mg/dL Final   GFR, Estimated 05/23/2021 >60  >60 mL/min Final   Comment: (NOTE) Calculated using the CKD-EPI Creatinine Equation (2021)    Anion gap 05/23/2021 7  5 - 15 Final   Performed at [facility], [place] [address], [place], [place] [address]   Cholesterol 05/23/2021 129  0 - 200 mg/dL Final   Triglycerides 05/23/2021 104  <150 mg/dL Final   HDL 05/23/2021 49  >40 mg/dL Final   Total CHOL/HDL Ratio 05/23/2021 2.6  RATIO Final   VLDL  05/23/2021 21  0 - 40 mg/dL Final   LDL Cholesterol 05/23/2021 59  0 - 99 mg/dL Final   Comment:        Total Cholesterol/HDL:CHD Risk Coronary Heart Disease Risk Table                     Men   Women  1/2 Average Risk   3.4   3.3  Average Risk       5.0   4.4  2 X Average Risk   9.6   7.1  3 X Average Risk  23.4   11.0        Use the calculated Patient Ratio above and the CHD Risk Table to determine the patient's CHD Risk.        ATP III CLASSIFICATION (LDL):  <100     mg/dL   Optimal  100-129  mg/dL   Near or Above                    Optimal  130-159  mg/dL   Borderline  160-189  mg/dL   High  >190     mg/dL   Very High Performed at [place] [address] [place], [place] [address]    TSH 05/23/2021 0.934  0.350 - 4.500 uIU/mL Final   Comment: Performed by a 3rd Generation assay with a functional sensitivity of <=0.01 uIU/mL. Performed at [facility], [place] [address] [place] [address]    POC Amphetamine UR 05/23/2021 None Detected  NONE DETECTED (Cut Off Level 1000 ng/mL) Final   POC Secobarbital (BAR) 05/23/2021 None Detected  NONE DETECTED (Cut Off Level 300 ng/mL) Final   POC Buprenorphine (BUP) 05/23/2021 None Detected  NONE DETECTED (Cut Off Level 10 ng/mL) Final   POC Oxazepam (BZO) 05/23/2021 Positive (A)  NONE DETECTED (Cut Off Level 300 ng/mL) Final   POC Cocaine UR 05/23/2021 None Detected  NONE DETECTED (Cut Off Level 300 ng/mL) Final   POC Methamphetamine UR 05/23/2021 None Detected  NONE DETECTED (Cut Off Level 1000 ng/mL) Final   POC Morphine 05/23/2021 None Detected  NONE DETECTED (Cut Off Level 300 ng/mL) Final   POC Oxycodone UR 05/23/2021 None Detected  NONE DETECTED (Cut Off Level 100 ng/mL) Final   POC Methadone UR 05/23/2021 None Detected  NONE DETECTED (Cut Off Level 300 ng/mL) Final   POC Marijuana UR 05/23/2021 None Detected  NONE DETECTED (Cut Off Level 50 ng/mL) Final   SARS Coronavirus 2 Ag 05/23/2021 Negative  Negative  Final   SARSCOV2ONAVIRUS 2 AG 05/23/2021 NEGATIVE  NEGATIVE Final   Comment: (NOTE) SARS-CoV-2 antigen NOT DETECTED.   Negative results are presumptive.  Negative results do not preclude SARS-CoV-2 infection and should not  be used as the sole basis for treatment or other patient management decisions, including infection  control decisions, particularly in the presence of clinical signs and  symptoms consistent with COVID-19, or in those who have been in contact with the virus.  Negative results must be combined with clinical observations, patient history, and epidemiological information. The expected result is Negative.  Fact Sheet for Patients: HandmadeRecipes.com.cy  Fact Sheet for Healthcare Providers: FuneralLife.at  This test is not yet approved or cleared by the [place] FDA and  has been authorized for detection and/or diagnosis of SARS-CoV-2 by FDA under an Emergency Use Authorization (EUA).  This EUA will remain in effect (meaning this test can be used) for the duration of  the COV                          ID-19 declaration under Section 564(b)(1) of the Act, 21 U.S.C. section 360bbb-3(b)(1), unless the authorization is terminated or revoked sooner.    Lab on 05/06/2021  Component Date Value Ref Range Status   Cholesterol 05/06/2021 151  0 - 200 mg/dL Final   ATP III Classification       Desirable:  < 200 mg/dL               Borderline High:  200 - 239 mg/dL          High:  > = 240 mg/dL   Triglycerides 05/06/2021 193.0 (A)  0.0 - 149.0 mg/dL Final   Normal:  <150 mg/dLBorderline High:  150 - 199 mg/dL   HDL 05/06/2021 46.40  >39.00 mg/dL Final   VLDL 05/06/2021 38.6  0.0 - 40.0 mg/dL Final   LDL Cholesterol 05/06/2021 66  0 - 99 mg/dL Final   Total CHOL/HDL Ratio 05/06/2021 3   Final                  Men          Women1/2 Average Risk     3.4          3.3Average Risk          5.0          4.42X Average Risk           9.6          7.13X Average Risk          15.0          11.0                       NonHDL 05/06/2021 104.45   Final   NOTE:  Non-HDL goal should be 30 mg/dL higher than patient's LDL goal (i.e. LDL goal of < 70 mg/dL, would have non-HDL goal of < 100 mg/dL)   TSH 05/06/2021 1.05  0.35 - 5.50 uIU/mL Final   Sodium 05/06/2021 141  135 - 145 mEq/L Final   Potassium 05/06/2021 4.3  3.5 - 5.1 mEq/L Final   Chloride 05/06/2021 105  96 - 112 mEq/L Final   CO2 05/06/2021 28  19 - 32 mEq/L Final   Glucose, Bld 05/06/2021 82  70 - 99 mg/dL Final   BUN 05/06/2021 11  6 - 23 mg/dL Final   Creatinine, Ser 05/06/2021 0.75  0.40 - 1.20 mg/dL Final   Total Bilirubin 05/06/2021 1.0  0.2 - 1.2 mg/dL Final   Alkaline Phosphatase 05/06/2021 52  39 - 117 U/L Final   AST  05/06/2021 19  0 - 37 U/L Final   ALT 05/06/2021 22  0 - 35 U/L Final   Total Protein 05/06/2021 7.0  6.0 - 8.3 g/dL Final   Albumin 05/06/2021 4.8  3.5 - 5.2 g/dL Final   GFR 05/06/2021 84.78  >60.00 mL/min Final   Calculated using the CKD-EPI Creatinine Equation (2021)   Calcium 05/06/2021 9.6  8.4 - 10.5 mg/dL Final   WBC 05/06/2021 6.0  4.0 - 10.5 K/uL Final   RBC 05/06/2021 5.10  3.87 - 5.11 Mil/uL Final   Hemoglobin 05/06/2021 14.5  12.0 - 15.0 g/dL Final   HCT 05/06/2021 45.8  36.0 - 46.0 % Final   MCV 05/06/2021 89.7  78.0 - 100.0 fl Final   MCHC 05/06/2021 31.8  30.0 - 36.0 g/dL Final   RDW 05/06/2021 13.1  11.5 - 15.5 % Final   Platelets 05/06/2021 223.0  150.0 - 400.0 K/uL Final   Neutrophils Relative % 05/06/2021 53.8  43.0 - 77.0 % Final   Lymphocytes Relative 05/06/2021 34.4  12.0 - 46.0 % Final   Monocytes Relative 05/06/2021 9.5  3.0 - 12.0 % Final   Eosinophils Relative 05/06/2021 1.7  0.0 - 5.0 % Final   Basophils Relative 05/06/2021 0.6  0.0 - 3.0 % Final   Neutro Abs 05/06/2021 3.2  1.4 - 7.7 K/uL Final   Lymphs Abs 05/06/2021 2.0  0.7 - 4.0 K/uL Final   Monocytes Absolute 05/06/2021 0.6  0.1 - 1.0 K/uL Final    Eosinophils Absolute 05/06/2021 0.1  0.0 - 0.7 K/uL Final   Basophils Absolute 05/06/2021 0.0  0.0 - 0.1 K/uL Final   Hgb A1c MFr Bld 05/06/2021 5.4  4.6 - 6.5 % Final   Glycemic Control Guidelines for People with Diabetes:Non Diabetic:  <6%Goal of Therapy: <7%Additional Action Suggested:  >8%     Allergies: Elemental sulfur, Carbamazepine, Ceclor [cefaclor], Morphine and related, and Atorvastatin  PTA Medications: (Not in a hospital admission)   Medical Decision Making  Overnight observation Initiated Seroquel 25 mg p.o. twice daily and 56 mg p.o. nightly Orders placed for Ekg-QT/QTc 392/443-completed (will provide additional outpatient resources for abnormal EKG)    Recommendations  Based on my evaluation the patient does not appear to have an emergency medical condition.  Derrill Center, NP 05/23/21  2:23 PM
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
## 2021-05-23 NOTE — ED Notes (Signed)
Pt using telephone. Pt calm and cooperative. No c/o pain or distress. Will continue to monitor for safety
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
## 2021-05-23 NOTE — Progress Notes (Signed)
Pt is admitted to Continuous OBS due to Paranoia and AVH. Pt is alert, oriented and appears to be anxious. Pt is ambulatory and is oriented to staff/unit. Pt was cooperative with skin assessment. Pt complained of back pain. PRN Tylenol and scheduled med administered with no incident. Pt denies current SI/HI. Pt endorses AVH. Pt made multiple phone calls and was advised to get some rest by Probation officer. Pt was receptive and is currently resting quietly. Staff will monitor for pt's safety.

## 2021-05-23 NOTE — BH Assessment (Signed)
Pt to [facility] with [facility] reporting paranoia and AVH. Per [employer] pt called [employer] a couple times last night due to hallucinations. [employer]PD reports that pt told them she thinks that people are putting fentanyl in her water, she is hearing voices and reports she has not slept in about 10 days. Pt reports she is diagnosed with PTSD and feels like she is not on the right medications. Pt reports she stopped seeing her psychiatrist Pauline Good at [facility] about two years ago and now her PCP is prescribing her medications. Pt reports she is prescribed 5mg  of Valium but has only been taking half because she was told it could affect her sleep. Pt reports she is not sleeping well and only had 1 hour of sleep last night. Pt reports hearing voices this morning of someone asking if they seen "Zenia Resides" who is her ex-husband. Pt reports she also has VH of her bedroom door opening and seeing a head looking into her room. Pt denies SI, HI and substance use. Pt presented to [facility] 4 days ago with similar issues.   Pt is urgent

## 2021-05-23 NOTE — ED Notes (Signed)
[facility] called for STAT pick up and to deliver to [facility].

## 2021-05-23 NOTE — ED Notes (Signed)
Pt is hearing voices, very tearful and upset. Writer tried to calm her down and get her to try and rest, but pt feels like she is hearing someone call her name

## 2021-05-23 NOTE — ED Notes (Signed)
Patient arrived on unit.

## 2021-05-24 DIAGNOSIS — F22 Delusional disorders: Secondary | ICD-10-CM | POA: Diagnosis not present

## 2021-05-24 DIAGNOSIS — T424X6A Underdosing of benzodiazepines, initial encounter: Secondary | ICD-10-CM | POA: Diagnosis not present

## 2021-05-24 DIAGNOSIS — F419 Anxiety disorder, unspecified: Secondary | ICD-10-CM | POA: Diagnosis not present

## 2021-05-24 DIAGNOSIS — Z91128 Patient's intentional underdosing of medication regimen for other reason: Secondary | ICD-10-CM | POA: Diagnosis not present

## 2021-05-24 DIAGNOSIS — R443 Hallucinations, unspecified: Secondary | ICD-10-CM | POA: Diagnosis not present

## 2021-05-24 DIAGNOSIS — F32A Depression, unspecified: Secondary | ICD-10-CM | POA: Diagnosis not present

## 2021-05-24 DIAGNOSIS — Z20822 Contact with and (suspected) exposure to covid-19: Secondary | ICD-10-CM | POA: Diagnosis not present

## 2021-05-24 MED ORDER — QUETIAPINE FUMARATE 25 MG PO TABS
ORAL_TABLET | ORAL | 0 refills | Status: DC
  Filled 2021-05-24: qty 60, 30d supply, fill #0

## 2021-05-24 NOTE — ED Notes (Signed)
Pt sleeping@this time. Breathing even and unlabored. Will continue to monitor for safety 

## 2021-05-24 NOTE — ED Notes (Signed)
Pt is crying stating she is heating a vice tell her that her son has died.
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
## 2021-05-24 NOTE — ED Notes (Signed)
Patient has auditory hallucinations that prevent her from sleeping. Patient is currently sleeping at this time. Will continue to monitor for safety.

## 2021-05-24 NOTE — ED Provider Notes (Signed)
FBC/OBS ASAP Discharge Summary  Date and Time: 05/24/2021 9:38 AM  Name: [name]  MRN:  [id number]   Discharge Diagnoses:  Final diagnoses:  Hallucination  Paranoia [facility])    Subjective: [name] reported " I am feeling much better now that I got sleep, that little white pill put me out."  Patient is requesting to discharge.  Discussed the benefits for inpatient admission for medication adjustment, patient declined.  Stay Summary: Ladelle was seen and evaluated for overnight observation due to paranoia and auditory hallucinations.  States she had been sleeping for the past few days.  Patient was initiated on Seroquel 25 mg p.o. twice daily and 50 mg p.o. nightly.  Patient reports she has taken Seroquel in the past.  Continues to deny suicidal or homicidal ideations.  Denies auditory or visual hallucinations during this assessment.  Denied voices are command in nature.  Per chart review patient continues to endorse hallucinations throughout her stay.  Patient to consider inpatient admission she declined. NP will make medications available at discharge.  Patient to follow-up with therapy and psychiatry.   Per initial admission assessment note:[name] is a 65 y.o. female.  Presents to [facility] urgent care accompanied by [facility] Department ( [facility]).  Was reported patient had called [employer] multiple times throughout the night due to paranoia.  She reports auditory hallucinations.  States she has not slept for the past 10 days and feels as if her mood and depression is getting worse.  Denying suicidal or homicidal ideations.  Denies auditory or visual hallucinations.  Chart review patient is prescribed Valium 5 mg p.o. twice daily as needed which she reports she has not been taking medications as directed.  Total Time spent with patient: 15 minutes  Past Psychiatric History: Past Medical History:  Past Medical History:  Diagnosis Date   Acute sinusitis 03/27/2013   Allergy     seasonal   Anxiety    Arthritis 06/09/2013   Depression    Dyspareunia    Edema extremities    Encounter for preventative adult health care exam with abnormal findings 04/21/2014   GERD (gastroesophageal reflux disease)    Hip pain, left 11/01/2011   History of proteinuria syndrome    Hyperlipidemia    Insomnia 03/11/2013   Low back pain 05/09/2011   Lymphadenitis 06/16/2012   Myalgia and myositis 02/15/2015   Obese    Onychomycosis 05/09/2011   Oral lesion 08/26/2013   Otitis externa 06/23/2012   left   Perimenopause 03/11/2013   RLS (restless legs syndrome)    RUQ pain 09/20/2011   Sleep apnea 04/21/2014   Tinea corporis 04/26/2017   Unspecified constipation 05/13/2013   Urinary frequency 08/26/2013    Past Surgical History:  Procedure Laterality Date   BACK SURGERY  2006   low, discectomy for herniated disc   BLADDER SUSPENSION N/A 11/07/2012   Procedure: [facility] SLING;  Surgeon: Malka So, MD;  Location: [facility];  Service: Urology;  Laterality: N/A;   CARPAL TUNNEL RELEASE Right 2006   CESAREAN SECTION  1993   CYSTOSCOPY N/A 11/07/2012   Procedure: CYSTOSCOPY;  Surgeon: Malka So, MD;  Location: [facility];  Service: Urology;  Laterality: N/A;   TUBAL LIGATION  1993   Family History:  Family History  Problem Relation Age of Onset   Cancer Mother 31       thyroid, malignant brain tumor   Arthritis Mother  neck, back   Hepatitis Sister        Hepatitis C   Heart disease Sister        stent   Diabetes Maternal Grandmother    Arthritis Father        back pain   Heart disease Father        stent   Breast cancer Maternal Aunt    Family Psychiatric History: Social History:  Social History   Substance and Sexual Activity  Alcohol Use Yes   Comment: occasionaly     Social History   Substance and Sexual Activity  Drug Use No    Social History   Socioeconomic History   Marital status: Married    Spouse name: Not on  file   Number of children: Not on file   Years of education: Not on file   Highest education level: Not on file  Occupational History   Not on file  Tobacco Use   Smoking status: Former    Packs/day: 0.50    Years: 20.00    Pack years: 10.00    Types: Cigarettes    Quit date: 07/12/1992    Years since quitting: 28.8   Smokeless tobacco: Never  Substance and Sexual Activity   Alcohol use: Yes    Comment: occasionaly   Drug use: No   Sexual activity: Yes    Partners: Male  Other Topics Concern   Not on file  Social History Narrative   Not on file   Social Determinants of Health   Financial Resource Strain: Not on file  Food Insecurity: Not on file  Transportation Needs: Not on file  Physical Activity: Not on file  Stress: Not on file  Social Connections: Not on file   SDOH:  SDOH Screenings   Alcohol Screen: Not on file  Depression (PHQ2-9): Medium Risk   PHQ-2 Score: 5  Financial Resource Strain: Not on file  Food Insecurity: Not on file  Housing: Not on file  Physical Activity: Not on file  Social Connections: Not on file  Stress: Not on file  Tobacco Use: Medium Risk   Smoking Tobacco Use: Former   Smokeless Tobacco Use: Never   Passive Exposure: Not on file  Transportation Needs: Not on file    Tobacco Cessation:  A prescription for an FDA-approved tobacco cessation medication provided at discharge  Current Medications:  Current Facility-Administered Medications  Medication Dose Route Frequency Provider Last Rate Last Admin   acetaminophen (TYLENOL) tablet 650 mg  650 mg Oral Q6H PRN Derrill Center, NP   650 mg at 05/23/21 0938   alum & mag hydroxide-simeth (MAALOX/MYLANTA) 200-200-20 MG/5ML suspension 30 mL  30 mL Oral Q4H PRN Derrill Center, NP       hydrOXYzine (ATARAX/VISTARIL) tablet 25 mg  25 mg Oral TID PRN Derrill Center, NP   25 mg at 05/23/21 2040   magnesium hydroxide (MILK OF MAGNESIA) suspension 30 mL  30 mL Oral Daily PRN Derrill Center,  NP       QUEtiapine (SEROQUEL) tablet 25 mg  25 mg Oral BID Derrill Center, NP   25 mg at 05/24/21 0840   QUEtiapine (SEROQUEL) tablet 50 mg  50 mg Oral QHS Derrill Center, NP   50 mg at 05/23/21 2040   traZODone (DESYREL) tablet 50 mg  50 mg Oral QHS PRN Derrill Center, NP   50 mg at 05/23/21 2040   Current Outpatient Medications  Medication Sig Dispense  Refill   albuterol (VENTOLIN HFA) 108 (90 Base) MCG/ACT inhaler INHALE 1 - 2 PUFFS INTO THE LUNGS EVERY 4 HOURS AS NEEDED FOR COUGH, WHEEZING, OR CHEST TIGHTNESS 18 g 3   amitriptyline (ELAVIL) 25 MG tablet Take 1 tablet (25 mg total) by mouth at bedtime. 30 tablet 3   baclofen (LIORESAL) 20 MG tablet TAKE 1 TABLET (20 MG) BY MOUTH AT BEDTIME AS NEEDED FOR MUSCLE SPASMS. 90 tablet 3   diazepam (VALIUM) 5 MG tablet Take 1 tablet (5 mg total) by mouth every 6 (six) hours as needed for anxiety. (Patient taking differently: Take 2.5 mg by mouth every 6 (six) hours as needed for anxiety.) 30 tablet 3   famotidine (PEPCID) 40 MG tablet Take 1 tablet (40 mg total) by mouth at bedtime. (Patient taking differently: Take 40 mg by mouth at bedtime as needed.) 90 tablet 3   fluticasone (FLONASE) 50 MCG/ACT nasal spray PLACE 2 SPRAYS INTO BOTH NOSTRILS DAILY. (Patient taking differently: Place 2 sprays into both nostrils daily as needed.) 16 g 6   hydrOXYzine (ATARAX/VISTARIL) 50 MG tablet Take 50 mg by mouth at bedtime as needed.     omeprazole (PRILOSEC) 40 MG capsule TAKE 1 CAPSULE (40 MG TOTAL) BY MOUTH DAILY. 90 capsule 1   potassium chloride SA (KLOR-CON) 20 MEQ tablet TAKE 1 TABLET (20 MEQ TOTAL) BY MOUTH DAILY. TAKE A 2ND TABLET DAILY AS NEEDED 100 tablet 1   QUEtiapine (SEROQUEL) 25 MG tablet Take 1 tablet (25 mg total) by mouth 2 (two) times daily for 30 days, THEN 2 tablets (50 mg total) at bedtime. 60 tablet 0   rosuvastatin (CRESTOR) 40 MG tablet TAKE 1 TABLET (40 MG TOTAL) BY MOUTH DAILY. 90 tablet 1   torsemide (DEMADEX) 20 MG tablet TAKE 1  TABLET (20 MG TOTAL) BY MOUTH DAILY. (Patient taking differently: Take 20 mg by mouth daily as needed.) 30 tablet 2    PTA Medications: (Not in a hospital admission)   Musculoskeletal  Strength & Muscle Tone: within normal limits Gait & Station: normal Patient leans: N/A  Psychiatric Specialty Exam  Presentation  General Appearance: Appropriate for Environment  Eye Contact:Good  Speech:Clear and Coherent; Normal Rate  Speech Volume:Normal  Handedness:Right   Mood and Affect  Mood:Anxious; Dysphoric  Affect:Congruent   Thought Process  Thought Processes:Coherent; Goal Directed  Descriptions of Associations:Intact  Orientation:Full (Time, Place and Person)  Thought Content:WDL  Diagnosis of Schizophrenia or Schizoaffective disorder in past: No  Duration of Psychotic Symptoms: Less than six months   Hallucinations:No data recorded Ideas of Reference:Paranoia  Suicidal Thoughts:No data recorded Homicidal Thoughts:No data recorded  Sensorium  Memory:Immediate Good; Recent Good  Judgment:Intact  Insight:No data recorded  Executive Functions  Concentration:Good  Attention Span:Good  [place] of Knowledge:Good  Language:Good   Psychomotor Activity  Psychomotor Activity:No data recorded  Assets  Assets:Communication Skills; Desire for Improvement; Financial Resources/Insurance; Housing; Resilience; Social Support; Transportation   Sleep  Sleep:No data recorded  No data recorded  Physical Exam  Physical Exam Vitals reviewed.  Neurological:     Mental Status: She is alert.   ROS Blood pressure 124/65, pulse 80, temperature 98.3 F (36.8 C), temperature source Oral, resp. rate 16, last menstrual period 10/03/2012, SpO2 97 %. There is no height or weight on file to calculate BMI.  Demographic Factors:  Caucasian and Living alone  Loss Factors: NA  Historical Factors: NA  Risk Reduction Factors:   Positive social support,  Positive therapeutic relationship,  and Positive coping skills or problem solving skills  Continued Clinical Symptoms:  Bipolar Disorder:   Mixed State Depression:   Impulsivity  Cognitive Features That Contribute To Risk:  Closed-mindedness    Suicide Risk:  Minimal: No identifiable suicidal ideation.  Patients presenting with no risk factors but with morbid ruminations; may be classified as minimal risk based on the severity of the depressive symptoms  Plan Of Care/Follow-up recommendations:  Activity:  as tolerated Diet:  heart healthy   Disposition: Take all medications as prescribed. Keep all follow-up appointments as scheduled.  Do not consume alcohol or use illegal drugs while on prescription medications. Report any adverse effects from your medications to your primary care provider promptly.  In the event of recurrent symptoms or worsening symptoms, call 911, a crisis hotline, or go to the nearest emergency department for evaluation.    Derrill Center, NP 05/24/2021, 9:38 AM

## 2021-05-25 ENCOUNTER — Other Ambulatory Visit (HOSPITAL_BASED_OUTPATIENT_CLINIC_OR_DEPARTMENT_OTHER): Payer: Self-pay

## 2021-05-25 ENCOUNTER — Encounter (HOSPITAL_COMMUNITY): Payer: Self-pay | Admitting: Registered Nurse

## 2021-05-25 ENCOUNTER — Telehealth (HOSPITAL_COMMUNITY): Payer: Self-pay | Admitting: Family Medicine

## 2021-05-25 ENCOUNTER — Other Ambulatory Visit: Payer: Self-pay | Admitting: Registered Nurse

## 2021-05-25 ENCOUNTER — Ambulatory Visit (HOSPITAL_COMMUNITY)
Admission: EM | Admit: 2021-05-25 | Discharge: 2021-05-26 | Disposition: A | Discharge status: Other Acute Inpatient Hospital | Payer: Medicare Other | Attending: Registered Nurse | Admitting: Registered Nurse

## 2021-05-25 DIAGNOSIS — F411 Generalized anxiety disorder: Secondary | ICD-10-CM | POA: Diagnosis not present

## 2021-05-25 DIAGNOSIS — Z20822 Contact with and (suspected) exposure to covid-19: Secondary | ICD-10-CM | POA: Insufficient documentation

## 2021-05-25 DIAGNOSIS — F4310 Post-traumatic stress disorder, unspecified: Secondary | ICD-10-CM | POA: Insufficient documentation

## 2021-05-25 DIAGNOSIS — F333 Major depressive disorder, recurrent, severe with psychotic symptoms: Secondary | ICD-10-CM | POA: Insufficient documentation

## 2021-05-25 DIAGNOSIS — F22 Delusional disorders: Secondary | ICD-10-CM | POA: Diagnosis not present

## 2021-05-25 LAB — POCT URINE DRUG SCREEN - MANUAL ENTRY (I-SCREEN)
POC Amphetamine UR: NOT DETECTED
POC Buprenorphine (BUP): POSITIVE — AB
POC Cocaine UR: NOT DETECTED
POC Marijuana UR: NOT DETECTED
POC Methadone UR: NOT DETECTED
POC Methamphetamine UR: NOT DETECTED
POC Morphine: NOT DETECTED
POC Oxazepam (BZO): NOT DETECTED
POC Oxycodone UR: NOT DETECTED
POC Secobarbital (BAR): NOT DETECTED

## 2021-05-25 LAB — PROLACTIN: Prolactin: 9.6 ng/mL (ref 4.8–23.3)

## 2021-05-25 MED ORDER — QUETIAPINE FUMARATE 25 MG PO TABS: 25.0000 mg | ORAL_TABLET | Freq: Two times a day (BID) | ORAL | Status: DC

## 2021-05-25 MED ORDER — QUETIAPINE FUMARATE ER 50 MG PO TB24
50.0000 mg | ORAL_TABLET | Freq: Every day | ORAL | Status: DC
  Administered 2021-05-25: 50 mg via ORAL
  Filled 2021-05-25: qty 1

## 2021-05-25 MED ORDER — PANTOPRAZOLE SODIUM 40 MG PO TBEC
40.0000 mg | DELAYED_RELEASE_TABLET | Freq: Every day | ORAL | Status: DC
  Administered 2021-05-26: 40 mg via ORAL
  Filled 2021-05-25: qty 1

## 2021-05-25 MED ORDER — MAGNESIUM HYDROXIDE 400 MG/5ML PO SUSP: 30.0000 mL | Freq: Every day | ORAL | Status: DC | PRN

## 2021-05-25 MED ORDER — ALBUTEROL SULFATE HFA 108 (90 BASE) MCG/ACT IN AERS: 1.0000 | INHALATION_SPRAY | RESPIRATORY_TRACT | Status: DC | PRN

## 2021-05-25 MED ORDER — HYDROXYZINE HCL 25 MG PO TABS: 25.0000 mg | ORAL_TABLET | Freq: Three times a day (TID) | ORAL | Status: DC | PRN

## 2021-05-25 MED ORDER — AMITRIPTYLINE HCL 25 MG PO TABS
25.0000 mg | ORAL_TABLET | Freq: Every day | ORAL | Status: DC
  Administered 2021-05-25: 25 mg via ORAL

## 2021-05-25 MED ORDER — ROSUVASTATIN CALCIUM 20 MG PO TABS: 40.0000 mg | ORAL_TABLET | Freq: Every day | ORAL | Status: DC

## 2021-05-25 MED ORDER — ALUM & MAG HYDROXIDE-SIMETH 200-200-20 MG/5ML PO SUSP: 30.0000 mL | ORAL | Status: DC | PRN

## 2021-05-25 MED ORDER — AMITRIPTYLINE HCL 25 MG PO TABS
25.0000 mg | ORAL_TABLET | Freq: Every day | ORAL | Status: DC
  Filled 2021-05-25: qty 1

## 2021-05-25 MED ORDER — TRAZODONE HCL 50 MG PO TABS
50.0000 mg | ORAL_TABLET | Freq: Once | ORAL | Status: AC
  Administered 2021-05-25: 50 mg via ORAL
  Filled 2021-05-25: qty 1

## 2021-05-25 MED ORDER — ACETAMINOPHEN 325 MG PO TABS: 650.0000 mg | ORAL_TABLET | Freq: Four times a day (QID) | ORAL | Status: DC | PRN

## 2021-05-25 MED ORDER — HYDROXYZINE HCL 25 MG PO TABS: 50.0000 mg | ORAL_TABLET | Freq: Every evening | ORAL | Status: DC | PRN
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
## 2021-05-25 NOTE — Progress Notes (Signed)
Pt is under review at [facility] [place] pt is under review at [facility]. Strader, RN advised that pt will need a new COVID-19 test. Rankin, NP reported the following: reordered blood pressure, UDS and UA, EKG.    Benjaman Kindler, MSW, LCSWA 05/25/2021 9:07 PM

## 2021-05-25 NOTE — Discharge Instructions (Addendum)
Transfer patient to [facility] for inpatient psychiatric admission.

## 2021-05-25 NOTE — Progress Notes (Addendum)
Pt was accepted to [facility] for inpatient behavioral health placement for tomorrow 05/26/21 after 10am. Pt will admit to the [facility] unit.   Pt meets inpatient criteria per Earleen Newport, NP  Attending Physician will be Hervey Ard, NP  Report can be called to: - [phone]  Pt can arrive after 10:00am  Care Team notified via secure chat: Ileene Musa, PA, Falencio Leland, [facility], [facility] Clayborne Dana, RN, and Cornelia Copa, RN.   CSW requested Nursing to coornidate transport and fax COVID-19 result when COVID result are completed [place], [place] 05/25/2021 @ 10:22 PM

## 2021-05-25 NOTE — BH Assessment (Signed)
Care Management - Follow Up [facility] Discharges   Writer attempted to make contact with patient today and was unsuccessful.  Writer left a HIPPA compliant voice message.   Per chart review, patient provided with outpatient resources.

## 2021-05-25 NOTE — ED Provider Notes (Signed)
Behavioral Health Urgent Care Medical Screening Exam  Patient Name: [name] MRN: [id number] Date of Evaluation: 05/25/21 Chief Complaint:   Diagnosis:  Final diagnoses:  MDD (major depressive disorder), recurrent, severe, with psychosis ([place])  GAD (generalized anxiety disorder)  PTSD (post-traumatic stress disorder)  Paranoia ([place])    History of Present illness: [name] is a 65 y.o. female patient presented to [facility] as a walk in with complaints of paranoia and auditory hallucinations  [name], 65 y.o., female patient seen face to face by this provider, consulted with Dr. Ernie Hew; and chart reviewed on 05/25/21.  On evaluation [name] reports she came back to urgent care because she continues to hear voices and feeling afraid.  Patient states that she is paranoid that she is being watched.  States she is having auditory hallucinations "Voices that are telling me what to do.  They are reading my thoughts and repeating what I am saying.  I have to be real careful about where my mind goes because the voices feed are for my thoughts and if I go astray that is no good."  Patient reports when she was discharged from [facility] she was not hearing voices but the voices restarted last night.  Patient denies any illicit drug use.  States she did have a half a glass of wine with her dinner last night.  Patient reports she has been compliant with her medication.  Patient denies suicidal/self-harm/homicidal ideation. During evaluation [name] is sitting upright in chair in no acute distress.  She is alert/oriented x 4; calm/cooperative; and mood congruent with affect.  She is speaking in a clear tone at moderate volume, and normal pace; with good eye contact.  Her thought process is coherent and relevant; There is no indication that she is currently responding to internal/external stimuli or experiencing delusional thought content other than her endorsing auditory  hallucination and paranoia.  She denies suicidal/self-harm/homicidal ideation..   . Psychiatric Specialty Exam  Presentation  General Appearance:Appropriate for Environment  Eye Contact:Good  Speech:Clear and Coherent; Normal Rate  Speech Volume:Normal  Handedness:Right   Mood and Affect  Mood:Anxious; Dysphoric  Affect:Congruent   Thought Process  Thought Processes:Coherent; Goal Directed  Descriptions of Associations:Intact  Orientation:Full (Time, Place and Person)  Thought Content:Paranoid Ideation  Diagnosis of Schizophrenia or Schizoaffective disorder in past: No   Hallucinations:Auditory States that the voices are able to read her mind and repeat what she is thinking  Ideas of Reference:Paranoia  Suicidal Thoughts:No  Homicidal Thoughts:No   Sensorium  Memory:Immediate Good; Recent Good  Judgment:Intact  Insight:Fair; Present   Executive Functions  Concentration:Fair  Attention Span:Fair  [place]  Language:Good   Psychomotor Activity  Psychomotor Activity:Normal   Assets  Assets:Communication Skills; Desire for Improvement; Housing; Resilience; Social Support   Sleep  Sleep:Fair  Number of hours: No data recorded  Nutritional Assessment (For OBS and FBC admissions only) Has the patient had a weight loss or gain of 10 pounds or more in the last 3 months?: No Has the patient had a decrease in food intake/or appetite?: No Does the patient have dental problems?: No Does the patient have eating habits or behaviors that may be indicators of an eating disorder including binging or inducing vomiting?: No Has the patient recently lost weight without trying?: 0 Has the patient been eating poorly because of a decreased appetite?: 0 Malnutrition Screening Tool Score: 0    Physical Exam: Physical  Exam Vitals and nursing note reviewed. Exam conducted with a chaperone present.  Constitutional:      General:  She is not in acute distress.    Appearance: Normal appearance. She is not ill-appearing.  Cardiovascular:     Rate and Rhythm: Normal rate.  Pulmonary:     Effort: Pulmonary effort is normal.  Musculoskeletal:        General: Normal range of motion.     Cervical back: Normal range of motion.  Skin:    General: Skin is warm and dry.  Neurological:     Mental Status: She is alert and oriented to person, place, and time.  Psychiatric:        Attention and Perception: Attention normal. She perceives visual hallucinations.        Mood and Affect: Mood is anxious and depressed.        Speech: Speech normal.        Behavior: Behavior normal. Behavior is cooperative.        Thought Content: Thought content is paranoid. Thought content does not include homicidal or suicidal ideation.        Cognition and Memory: Cognition and memory normal.        Judgment: Judgment normal.   Review of Systems  Constitutional: Negative.   HENT: Negative.    Eyes: Negative.   Respiratory: Negative.    Cardiovascular: Negative.   Gastrointestinal: Negative.   Genitourinary: Negative.   Musculoskeletal: Negative.   Skin: Negative.   Neurological: Negative.   Endo/Heme/Allergies: Negative.   Psychiatric/Behavioral:  Positive for depression and hallucinations. Negative for memory loss, substance abuse and suicidal ideas. The patient is nervous/anxious.   Blood pressure (!) 85/55, pulse 94, temperature 98.3 F (36.8 C), temperature source Oral, resp. rate 16, last menstrual period 10/03/2012, SpO2 96 %. There is no height or weight on file to calculate BMI.  Musculoskeletal: Strength & Muscle Tone: within normal limits Gait & Station: normal Patient leans: N/A   [place] [facility] Discharge Disposition for Follow up and Recommendations: Based on my evaluation the patient does not appear to have an emergency medical condition and can be discharged with resources and follow up care in outpatient services for  inpatient psychiatric treatment  Spoke with Dimple Nanas, RN ([place]) requested bed for inpatient psychiatric treatment.  Reports we will review patient's chart and labs and possibility of available bed if patient is able to have a roommate and COVID is negative.   Kristen Creelman, NP 05/25/2021, 6:33 PM

## 2021-05-25 NOTE — Progress Notes (Signed)
Inpatient Behavioral Health Placement   Meets inpatient criteria per Earleen Newport, NP. Per [facility] [place] [facility], Clayborne Dana, RN there are no appropriate beds for pt.  Referral was sent to the following facilities;   Destination Service Provider Address Phone Fax  [address] [place] [address] [place]  [facility]  [place] [address] [phone]  [facility]  [place], [place] [address] [phone]  [facility]  [place], [place] [address] [phone]  [facility]  [address] [place] [address] [phone] [facility]  [address], [place] [address] [phone]  [facility]  [address] [place] [address] [phone] [place]  [address], [place] [address] [phone] [facility]  [address], [place] [address] [phone] [place]  [address], [place] [address] [phone] [facility]  [address] [place] [address] [phone] [facility]  [address] [place] [address] [phone] [place]  [place], [place] [address] [place]  [facility]  [address], [place] [address] [phone]    Situation ongoing,  CSW will follow up.   Benjaman Kindler, MSW, [facility] 05/25/2021  @ 9:47 PM

## 2021-05-26 DIAGNOSIS — F4310 Post-traumatic stress disorder, unspecified: Secondary | ICD-10-CM | POA: Diagnosis not present

## 2021-05-26 DIAGNOSIS — F411 Generalized anxiety disorder: Secondary | ICD-10-CM | POA: Diagnosis not present

## 2021-05-26 DIAGNOSIS — I890 Lymphedema, not elsewhere classified: Secondary | ICD-10-CM | POA: Diagnosis present

## 2021-05-26 DIAGNOSIS — F29 Unspecified psychosis not due to a substance or known physiological condition: Secondary | ICD-10-CM | POA: Diagnosis not present

## 2021-05-26 DIAGNOSIS — Z62810 Personal history of physical and sexual abuse in childhood: Secondary | ICD-10-CM | POA: Diagnosis present

## 2021-05-26 DIAGNOSIS — K219 Gastro-esophageal reflux disease without esophagitis: Secondary | ICD-10-CM | POA: Diagnosis present

## 2021-05-26 DIAGNOSIS — M549 Dorsalgia, unspecified: Secondary | ICD-10-CM | POA: Diagnosis not present

## 2021-05-26 DIAGNOSIS — M62838 Other muscle spasm: Secondary | ICD-10-CM | POA: Diagnosis present

## 2021-05-26 DIAGNOSIS — Z87891 Personal history of nicotine dependence: Secondary | ICD-10-CM | POA: Diagnosis not present

## 2021-05-26 DIAGNOSIS — Z20822 Contact with and (suspected) exposure to covid-19: Secondary | ICD-10-CM | POA: Diagnosis not present

## 2021-05-26 DIAGNOSIS — E785 Hyperlipidemia, unspecified: Secondary | ICD-10-CM | POA: Diagnosis present

## 2021-05-26 DIAGNOSIS — F332 Major depressive disorder, recurrent severe without psychotic features: Secondary | ICD-10-CM | POA: Diagnosis not present

## 2021-05-26 DIAGNOSIS — F333 Major depressive disorder, recurrent, severe with psychotic symptoms: Secondary | ICD-10-CM | POA: Diagnosis not present

## 2021-05-26 DIAGNOSIS — F315 Bipolar disorder, current episode depressed, severe, with psychotic features: Secondary | ICD-10-CM | POA: Diagnosis present

## 2021-05-26 DIAGNOSIS — G4700 Insomnia, unspecified: Secondary | ICD-10-CM | POA: Diagnosis present

## 2021-05-26 LAB — RESP PANEL BY RT-PCR (FLU A&B, COVID) ARPGX2
Influenza A by PCR: NEGATIVE
Influenza B by PCR: NEGATIVE
SARS Coronavirus 2 by RT PCR: NEGATIVE

## 2021-05-26 MED ORDER — QUETIAPINE FUMARATE 25 MG PO TABS: 50.0000 mg | ORAL_TABLET | ORAL | Status: DC

## 2021-05-26 NOTE — ED Notes (Signed)
Pt alert, oriented, and ambulatory.  Denies pain or discomfort at this time.  Reports she slept well over night.  States she is hearing voices telling her to do different things.  When she does things she states the voices tell her she is doing the wrong thing.  Denies VH.   Pt has been accepted to [facility] and is aware that she will be going after 10am.  Pt states she feels safe here and is able to contract for safety.  Will continue to monitor for safety.

## 2021-05-26 NOTE — ED Notes (Signed)
Sitting quietly.  Pt will get up at times and start talking to someone who is not present.  Pt will then sit down after talking to this "voice/person" who others do not see.  Will continue to monitor for safety.

## 2021-05-26 NOTE — ED Notes (Signed)
Belongings returned to pt prior to leaving via safe transport.  Pt will be going to [facility].  Safety maintained.

## 2021-05-26 NOTE — ED Provider Notes (Signed)
FBC/OBS ASAP Discharge Summary  Date and Time: 05/26/2021 10:44 AM  Name: [name]  MRN:  [id number]   Discharge Diagnoses:  Final diagnoses:  MDD (major depressive disorder), recurrent, severe, with psychosis ([place])  GAD (generalized anxiety disorder)  PTSD (post-traumatic stress disorder)  Paranoia ([place])    Subjective:   [name], 65 y.o., female patient presented to [facility]  on 05/25/2021 and admitted to the continuous assessment while awaiting inpatient psychiatric admission. There are no available beds at [facility].  Patient seen face to face by this provider and consulted with Dr. Serafina Mitchell; and chart reviewed on 05/26/21.    During evaluation [name] is in sitting position in no acute distress.  She makes minimal eye contact.  She is disheveled.  Her speech is clear, coherent, normal rate and tone.  She is alert/oriented x 3 and cooperative.  She is anxious with a congruent affect.  She is looking around the room and trying to read this writer's paper.  She is easily distracted she appears paranoid.  Patient stands up and says, "do you hear that they are calling me".  She opens the door but sees no one is there so she sets the door and sits back down.  She is responding to internal/external stimuli.  She denies visual hallucinations. states her voices told her last nigh to "get up and call the police because there is a gas leak in this room and were trapped in".  She is tearful when talking about her pet dog that passed away.  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  She is exhibiting some delusional thought.  She denies suicidal/homicidal ideations.  Stay Summary: Patient has been cooperative on the unit.  She has been accepted to [facility] for inpatient psychiatric admission.  Patient will be transported today via safe transport.  Patient agrees  Total Time spent with patient: 30  minutes  Past Psychiatric History: See H&P Past Medical History:  Past Medical History:  Diagnosis Date   Acute sinusitis 03/27/2013   Allergy    seasonal   Anxiety    Arthritis 06/09/2013   Depression    Dyspareunia    Edema extremities    Encounter for preventative adult health care exam with abnormal findings 04/21/2014   GERD (gastroesophageal reflux disease)    Hip pain, left 11/01/2011   History of proteinuria syndrome    Hyperlipidemia    Insomnia 03/11/2013   Low back pain 05/09/2011   Lymphadenitis 06/16/2012   Myalgia and myositis 02/15/2015   Obese    Onychomycosis 05/09/2011   Oral lesion 08/26/2013   Otitis externa 06/23/2012   left   Perimenopause 03/11/2013   RLS (restless legs syndrome)    RUQ pain 09/20/2011   Sleep apnea 04/21/2014   Tinea corporis 04/26/2017   Unspecified constipation 05/13/2013   Urinary frequency 08/26/2013    Past Surgical History:  Procedure Laterality Date   BACK SURGERY  2006   low, discectomy for herniated disc   BLADDER SUSPENSION N/A 11/07/2012   Procedure: [facility] SLING;  Surgeon: Malka So, MD;  Location: [facility];  Service: Urology;  Laterality: N/A;   CARPAL TUNNEL RELEASE Right 2006   CESAREAN SECTION  1993   CYSTOSCOPY N/A 11/07/2012   Procedure: CYSTOSCOPY;  Surgeon: Malka So, MD;  Location: [facility];  Service: Urology;  Laterality: N/A;  TUBAL LIGATION  1993   Family History:  Family History  Problem Relation Age of Onset   Cancer Mother 22       thyroid, malignant brain tumor   Arthritis Mother        neck, back   Hepatitis Sister        Hepatitis C   Heart disease Sister        stent   Diabetes Maternal Grandmother    Arthritis Father        back pain   Heart disease Father        stent   Breast cancer Maternal Aunt    Family Psychiatric History: See H&P Social History:  Social History   Substance and Sexual Activity  Alcohol Use Yes   Comment: occasionaly      Social History   Substance and Sexual Activity  Drug Use No    Social History   Socioeconomic History   Marital status: Married    Spouse name: Not on file   Number of children: Not on file   Years of education: Not on file   Highest education level: Not on file  Occupational History   Not on file  Tobacco Use   Smoking status: Former    Packs/day: 0.50    Years: 20.00    Pack years: 10.00    Types: Cigarettes    Quit date: 07/12/1992    Years since quitting: 28.8   Smokeless tobacco: Never  Substance and Sexual Activity   Alcohol use: Yes    Comment: occasionaly   Drug use: No   Sexual activity: Yes    Partners: Male  Other Topics Concern   Not on file  Social History Narrative   Not on file   Social Determinants of Health   Financial Resource Strain: Not on file  Food Insecurity: Not on file  Transportation Needs: Not on file  Physical Activity: Not on file  Stress: Not on file  Social Connections: Not on file   SDOH:  SDOH Screenings   Alcohol Screen: Not on file  Depression (PHQ2-9): Medium Risk   PHQ-2 Score: 5  Financial Resource Strain: Not on file  Food Insecurity: Not on file  Housing: Not on file  Physical Activity: Not on file  Social Connections: Not on file  Stress: Not on file  Tobacco Use: Medium Risk   Smoking Tobacco Use: Former   Smokeless Tobacco Use: Never   Passive Exposure: Not on file  Transportation Needs: Not on file    Tobacco Cessation:  N/A, patient does not currently use tobacco products  Current Medications:  Current Facility-Administered Medications  Medication Dose Route Frequency Provider Last Rate Last Admin   acetaminophen (TYLENOL) tablet 650 mg  650 mg Oral Q6H PRN Rankin, Shuvon B, NP       albuterol (VENTOLIN HFA) 108 (90 Base) MCG/ACT inhaler 1-2 puff  1-2 puff Inhalation Q4H PRN Margorie John W, PA-C       alum & mag hydroxide-simeth (MAALOX/MYLANTA) 200-200-20 MG/5ML suspension 30 mL  30 mL Oral Q4H PRN  Rankin, Shuvon B, NP       amitriptyline (ELAVIL) tablet 25 mg  25 mg Oral QHS Lovena Le, Cody W, PA-C       hydrOXYzine (ATARAX/VISTARIL) tablet 50 mg  50 mg Oral QHS PRN Rankin, Shuvon B, NP       magnesium hydroxide (MILK OF MAGNESIA) suspension 30 mL  30 mL Oral Daily PRN Rankin, Shuvon B, NP  pantoprazole (PROTONIX) EC tablet 40 mg  40 mg Oral Daily Rankin, Shuvon B, NP       QUEtiapine (SEROQUEL XR) 24 hr tablet 50 mg  50 mg Oral QHS Rankin, Shuvon B, NP   50 mg at 05/25/21 2357   rosuvastatin (CRESTOR) tablet 40 mg  40 mg Oral Daily Rankin, Shuvon B, NP       Current Outpatient Medications  Medication Sig Dispense Refill   acetaminophen (TYLENOL) 500 MG tablet Take 1,000 mg by mouth every 6 (six) hours as needed for headache or mild pain.     albuterol (VENTOLIN HFA) 108 (90 Base) MCG/ACT inhaler INHALE 1 - 2 PUFFS INTO THE LUNGS EVERY 4 HOURS AS NEEDED FOR COUGH, WHEEZING, OR CHEST TIGHTNESS (Patient taking differently: Inhale 1-2 puffs into the lungs every 4 (four) hours as needed (For cough, wheezing or chest tightness).) 18 g 3   amitriptyline (ELAVIL) 25 MG tablet Take 1 tablet (25 mg total) by mouth at bedtime. 30 tablet 3   Ascorbic Acid (VITAMIN C PO) Take 1 tablet by mouth daily.     baclofen (LIORESAL) 20 MG tablet TAKE 1 TABLET (20 MG) BY MOUTH AT BEDTIME AS NEEDED FOR MUSCLE SPASMS. (Patient taking differently: Take 20 mg by mouth at bedtime as needed for muscle spasms.) 90 tablet 3   diazepam (VALIUM) 5 MG tablet Take 1 tablet (5 mg total) by mouth every 6 (six) hours as needed for anxiety. 30 tablet 3   famotidine (PEPCID) 40 MG tablet Take 1 tablet (40 mg total) by mouth at bedtime. (Patient taking differently: Take 40 mg by mouth at bedtime as needed for heartburn or indigestion.) 90 tablet 3   fluticasone (FLONASE) 50 MCG/ACT nasal spray PLACE 2 SPRAYS INTO BOTH NOSTRILS DAILY. (Patient taking differently: Place 2 sprays into both nostrils daily as needed for allergies.) 16  g 6   hydrOXYzine (ATARAX/VISTARIL) 50 MG tablet Take 50 mg by mouth at bedtime as needed for anxiety.     omeprazole (PRILOSEC) 40 MG capsule TAKE 1 CAPSULE (40 MG TOTAL) BY MOUTH DAILY. (Patient taking differently: Take 40 mg by mouth daily.) 90 capsule 1   potassium chloride SA (KLOR-CON) 20 MEQ tablet TAKE 1 TABLET (20 MEQ TOTAL) BY MOUTH DAILY. TAKE A 2ND TABLET DAILY AS NEEDED (Patient taking differently: Take 20 mEq by mouth daily.) 100 tablet 1   QUEtiapine (SEROQUEL) 25 MG tablet Take 25-50 mg by mouth See admin instructions. Take one tablet twice daily for 30 days starting on 05/24/21, then two tablets twice daily at bedtime.     rosuvastatin (CRESTOR) 40 MG tablet TAKE 1 TABLET (40 MG TOTAL) BY MOUTH DAILY. (Patient taking differently: Take 40 mg by mouth daily.) 90 tablet 1   torsemide (DEMADEX) 20 MG tablet TAKE 1 TABLET (20 MG TOTAL) BY MOUTH DAILY. (Patient taking differently: Take 20 mg by mouth daily as needed (For fluid).) 30 tablet 2   VITAMIN D PO Take 1 tablet by mouth daily.      PTA Medications: (Not in a hospital admission)   Musculoskeletal  Strength & Muscle Tone: within normal limits Gait & Station: normal Patient leans: N/A  Psychiatric Specialty Exam  Presentation  General Appearance: Casual  Eye Contact:Good  Speech:Clear and Coherent; Normal Rate  Speech Volume:Normal  Handedness:Right   Mood and Affect  Mood:Anxious; Depressed  Affect:Congruent   Thought Process  Thought Processes:Coherent  Descriptions of Associations:Intact  Orientation:Full (Time, Place and Person)  Thought Content:Paranoid Ideation  Diagnosis  of Schizophrenia or Schizoaffective disorder in past: No    Hallucinations:Hallucinations: Auditory Description of Auditory Hallucinations: hears voices that are telling her there is a gas leak in this facility  Ideas of Reference:Paranoia  Suicidal Thoughts:Suicidal Thoughts: No  Homicidal Thoughts:Homicidal Thoughts:  No   [place]  Memory:Immediate Good; Recent Good; Remote Good  Judgment:Fair  Insight:Fair   Executive Functions  Concentration:Fair  Attention Span:Fair  [place]  Language:Good   Psychomotor Activity  Psychomotor Activity:Psychomotor Activity: Normal   Assets  Assets:Communication Skills; Desire for Improvement; Financial Resources/Insurance; Housing; Physical Health; Resilience; Social Support   Sleep  Sleep:Sleep: Fair   Nutritional Assessment (For OBS and FBC admissions only) Has the patient had a weight loss or gain of 10 pounds or more in the last 3 months?: No Has the patient had a decrease in food intake/or appetite?: No Does the patient have dental problems?: No Does the patient have eating habits or behaviors that may be indicators of an eating disorder including binging or inducing vomiting?: No Has the patient recently lost weight without trying?: 0 Has the patient been eating poorly because of a decreased appetite?: 0 Malnutrition Screening Tool Score: 0    Physical Exam  Physical Exam Vitals and nursing note reviewed.  Constitutional:      General: She is not in acute distress.    Appearance: Normal appearance. She is not ill-appearing.  HENT:     Head: Normocephalic.  Eyes:     General:        Right eye: No discharge.        Left eye: No discharge.     Conjunctiva/sclera: Conjunctivae normal.  Cardiovascular:     Rate and Rhythm: Normal rate.  Pulmonary:     Effort: Pulmonary effort is normal.  Musculoskeletal:        General: Normal range of motion.     Cervical back: Normal range of motion.  Skin:    Coloration: Skin is not jaundiced or pale.  Neurological:     Mental Status: She is alert and oriented to person, place, and time.  Psychiatric:        Attention and Perception: She is inattentive. She perceives auditory hallucinations.        Mood and Affect: Mood is anxious and depressed. Affect is  tearful.        Speech: Speech normal.        Behavior: Behavior normal. Behavior is cooperative.        Thought Content: Thought content is paranoid and delusional.        Cognition and Memory: Cognition normal.        Judgment: Judgment is impulsive.   Review of Systems  Constitutional: Negative.   HENT: Negative.    Eyes: Negative.   Respiratory: Negative.    Cardiovascular: Negative.   Musculoskeletal: Negative.   Skin: Negative.   Neurological: Negative.   Psychiatric/Behavioral:  Positive for depression and hallucinations. The patient is nervous/anxious.   Blood pressure (!) 122/56, pulse 78, temperature 98 F (36.7 C), temperature source Tympanic, resp. rate 16, last menstrual period 10/03/2012, SpO2 97 %. There is no height or weight on file to calculate BMI.  Demographic Factors:  Divorced or widowed, Caucasian, Living alone, and Unemployed  Loss Factors: Loss of significant relationship and Financial problems/change in socioeconomic status  Historical Factors: Impulsivity  Risk Reduction Factors:   Sense of responsibility to family, Positive social support, Positive therapeutic relationship, and Positive coping skills or  problem solving skills  Continued Clinical Symptoms:  Severe Anxiety and/or Agitation Depression:   Hopelessness Impulsivity  Cognitive Features That Contribute To Risk:  None    Suicide Risk:  Minimal: No identifiable suicidal ideation.  Patients presenting with no risk factors but with morbid ruminations; may be classified as minimal risk based on the severity of the depressive symptoms  Plan Of Care/Follow-up recommendations:  Activity:  as tolerated  Diet:  regular  Disposition:  Discharge and transfer patient via safe transport to [facility] for inpatient psychiatric admission.  Revonda Humphrey, NP 05/26/2021, 10:44 AM

## 2021-05-26 NOTE — ED Notes (Signed)
Safe transport called 

## 2021-05-26 NOTE — ED Notes (Signed)
Report called to [facility] [place].  Pt has been made aware that she has been accepted to [facility] and is willing to go.  Provided pt with address of hospital and number.  She stated she would let her family know where she will be going.  No pain or discomfort voiced or noted.  Will continue to monitor for safety.

## 2021-05-28 ENCOUNTER — Telehealth (HOSPITAL_COMMUNITY): Payer: Self-pay | Admitting: Family Medicine

## 2021-05-28 NOTE — BH Assessment (Signed)
Care Management - Follow Up [facility] Discharges   Patient has been placed in an inpatient psychiatric hospital [facility]) on 05-26-2021.

## 2021-06-03 ENCOUNTER — Other Ambulatory Visit (HOSPITAL_BASED_OUTPATIENT_CLINIC_OR_DEPARTMENT_OTHER): Payer: Self-pay

## 2021-06-03 MED ORDER — TRAZODONE HCL 50 MG PO TABS
50.0000 mg | ORAL_TABLET | Freq: Every day | ORAL | 0 refills | Status: DC
  Filled 2021-06-03: qty 30, 30d supply, fill #0

## 2021-06-03 MED ORDER — QUETIAPINE FUMARATE 200 MG PO TABS
200.0000 mg | ORAL_TABLET | Freq: Every day | ORAL | 0 refills | Status: DC
  Filled 2021-06-03: qty 30, 30d supply, fill #0

## 2021-06-03 MED ORDER — ROPINIROLE HCL 1 MG PO TABS
1.0000 mg | ORAL_TABLET | Freq: Every day | ORAL | 0 refills | Status: DC
  Filled 2021-06-03: qty 30, 30d supply, fill #0

## 2021-06-03 MED ORDER — HYDROXYZINE PAMOATE 25 MG PO CAPS
25.0000 mg | ORAL_CAPSULE | Freq: Two times a day (BID) | ORAL | 0 refills | Status: DC | PRN
  Filled 2021-06-03: qty 40, 20d supply, fill #0

## 2021-06-15 ENCOUNTER — Other Ambulatory Visit: Payer: Self-pay | Admitting: Family Medicine

## 2021-06-15 ENCOUNTER — Other Ambulatory Visit (HOSPITAL_BASED_OUTPATIENT_CLINIC_OR_DEPARTMENT_OTHER): Payer: Self-pay

## 2021-06-15 MED ORDER — ROSUVASTATIN CALCIUM 40 MG PO TABS
ORAL_TABLET | Freq: Every day | ORAL | 1 refills | Status: DC
  Filled 2021-06-15: qty 90, 90d supply, fill #0
  Filled 2021-09-11: qty 90, 90d supply, fill #1

## 2021-06-15 MED FILL — Baclofen Tab 20 MG: ORAL | 90 days supply | Qty: 90 | Fill #2 | Status: AC

## 2021-06-17 ENCOUNTER — Other Ambulatory Visit (HOSPITAL_BASED_OUTPATIENT_CLINIC_OR_DEPARTMENT_OTHER): Payer: Self-pay

## 2021-06-17 DIAGNOSIS — F5102 Adjustment insomnia: Secondary | ICD-10-CM | POA: Diagnosis not present

## 2021-06-17 DIAGNOSIS — F4323 Adjustment disorder with mixed anxiety and depressed mood: Secondary | ICD-10-CM | POA: Diagnosis not present

## 2021-06-17 DIAGNOSIS — F23 Brief psychotic disorder: Secondary | ICD-10-CM | POA: Diagnosis not present

## 2021-06-17 MED ORDER — QUETIAPINE FUMARATE 200 MG PO TABS
ORAL_TABLET | ORAL | 0 refills | Status: DC
  Filled 2021-06-29: qty 30, 30d supply, fill #0

## 2021-06-17 MED ORDER — TRAZODONE HCL 50 MG PO TABS
ORAL_TABLET | ORAL | 0 refills | Status: DC
  Filled 2021-06-29: qty 60, 30d supply, fill #0

## 2021-06-17 MED ORDER — LORAZEPAM 0.5 MG PO TABS
ORAL_TABLET | ORAL | 0 refills | Status: DC
  Filled 2021-06-17: qty 20, 20d supply, fill #0

## 2021-06-17 MED ORDER — ROPINIROLE HCL 1 MG PO TABS
ORAL_TABLET | ORAL | 0 refills | Status: DC
  Filled 2021-06-29: qty 30, 30d supply, fill #0

## 2021-06-25 ENCOUNTER — Ambulatory Visit (INDEPENDENT_AMBULATORY_CARE_PROVIDER_SITE_OTHER): Payer: Medicare Other | Admitting: Family Medicine

## 2021-06-25 ENCOUNTER — Encounter: Payer: Self-pay | Admitting: Family Medicine

## 2021-06-25 VITALS — BP 112/64 | HR 80 | Temp 97.3°F | Resp 16 | Ht 64.0 in | Wt 193.2 lb

## 2021-06-25 DIAGNOSIS — F418 Other specified anxiety disorders: Secondary | ICD-10-CM | POA: Diagnosis not present

## 2021-06-25 DIAGNOSIS — E782 Mixed hyperlipidemia: Secondary | ICD-10-CM

## 2021-06-25 DIAGNOSIS — G2581 Restless legs syndrome: Secondary | ICD-10-CM

## 2021-06-25 DIAGNOSIS — G4700 Insomnia, unspecified: Secondary | ICD-10-CM

## 2021-06-25 DIAGNOSIS — Z79899 Other long term (current) drug therapy: Secondary | ICD-10-CM | POA: Diagnosis not present

## 2021-06-25 DIAGNOSIS — R739 Hyperglycemia, unspecified: Secondary | ICD-10-CM | POA: Diagnosis not present

## 2021-06-25 DIAGNOSIS — F411 Generalized anxiety disorder: Secondary | ICD-10-CM | POA: Diagnosis not present

## 2021-06-25 DIAGNOSIS — F23 Brief psychotic disorder: Secondary | ICD-10-CM | POA: Diagnosis not present

## 2021-06-25 DIAGNOSIS — R252 Cramp and spasm: Secondary | ICD-10-CM

## 2021-06-25 DIAGNOSIS — F4323 Adjustment disorder with mixed anxiety and depressed mood: Secondary | ICD-10-CM | POA: Diagnosis not present

## 2021-06-25 DIAGNOSIS — F5102 Adjustment insomnia: Secondary | ICD-10-CM | POA: Diagnosis not present

## 2021-06-25 NOTE — Assessment & Plan Note (Signed)
hgba1c acceptable, minimize simple carbs. Increase exercise as tolerated.  

## 2021-06-25 NOTE — Assessment & Plan Note (Signed)
Encourage heart healthy diet such as MIND or DASH diet, increase exercise, avoid trans fats, simple carbohydrates and processed foods, consider a krill or fish or flaxseed oil cap daily.  °

## 2021-06-25 NOTE — Patient Instructions (Signed)

## 2021-06-25 NOTE — Assessment & Plan Note (Signed)
Working with psychiatry. Dr Gaetano Net, [facility] and is doing well on current meds. She has an appt with their therapist Cheryll Cockayne today

## 2021-06-25 NOTE — Progress Notes (Signed)
Patient ID: [name], female    DOB: 02/15/1956  Age: 65 y.o. MRN: [id number]    Subjective:   No chief complaint on file.  Subjective   HPI [name] presents for office visit today for follow up on GERD and MDD. She has no recent febrile illnesses or recent ER visits. She has just finalized her divorce and currently lives at an apartment. She is starting with a counselor today and her psychiatrist has switched her from Hydroxyzine to trazodone which she states is helping her sleep better. Denies CP/palp/SOB/HA/congestion/fevers/GI or GU c/o. Taking meds as prescribed.   Review of Systems  Constitutional:  Negative for chills, fatigue and fever.  HENT:  Negative for congestion, rhinorrhea, sinus pressure, sinus pain and sore throat.   Eyes:  Negative for pain.  Respiratory:  Negative for cough and shortness of breath.   Cardiovascular:  Negative for chest pain, palpitations and leg swelling.  Gastrointestinal:  Negative for abdominal pain, blood in stool, diarrhea, nausea and vomiting.  Genitourinary:  Negative for decreased urine volume, flank pain, frequency, vaginal bleeding and vaginal discharge.  Musculoskeletal:  Negative for back pain.  Neurological:  Negative for headaches.   History Past Medical History:  Diagnosis Date   Acute sinusitis 03/27/2013   Allergy    seasonal   Anxiety    Arthritis 06/09/2013   Depression    Dyspareunia    Edema extremities    Encounter for preventative adult health care exam with abnormal findings 04/21/2014   GERD (gastroesophageal reflux disease)    Hip pain, left 11/01/2011   History of proteinuria syndrome    Hyperlipidemia    Insomnia 03/11/2013   Low back pain 05/09/2011   Lymphadenitis 06/16/2012   Myalgia and myositis 02/15/2015   Obese    Onychomycosis 05/09/2011   Oral lesion 08/26/2013   Otitis externa 06/23/2012   left   Perimenopause 03/11/2013   RLS (restless legs syndrome)    RUQ pain 09/20/2011   Sleep apnea  04/21/2014   Tinea corporis 04/26/2017   Unspecified constipation 05/13/2013   Urinary frequency 08/26/2013    She has a past surgical history that includes Cesarean section (1993); Back surgery (2006); Tubal ligation (1993); Carpal tunnel release (Right, 2006); Bladder suspension (N/A, 11/07/2012); and Cystoscopy (N/A, 11/07/2012).   Her family history includes Arthritis in her father and mother; Breast cancer in her maternal aunt; Cancer (age of onset: 27) in her mother; Diabetes in her maternal grandmother; Heart disease in her father and sister; Hepatitis in her sister.She reports that she quit smoking about 28 years ago. Her smoking use included cigarettes. She has a 10.00 pack-year smoking history. She has never used smokeless tobacco. She reports current alcohol use. She reports that she does not use drugs.  Current Outpatient Medications on File Prior to Visit  Medication Sig Dispense Refill   acetaminophen (TYLENOL) 500 MG tablet Take 1,000 mg by mouth every 6 (six) hours as needed for headache or mild pain.     albuterol (VENTOLIN HFA) 108 (90 Base) MCG/ACT inhaler INHALE 1 - 2 PUFFS INTO THE LUNGS EVERY 4 HOURS AS NEEDED FOR COUGH, WHEEZING, OR CHEST TIGHTNESS (Patient taking differently: Inhale 1-2 puffs into the lungs every 4 (four) hours as needed (For cough, wheezing or chest tightness).) 18 g 3   Ascorbic Acid (VITAMIN C PO) Take 1 tablet by mouth daily.     baclofen (LIORESAL) 20 MG tablet TAKE 1 TABLET (20 MG) BY MOUTH AT BEDTIME AS  NEEDED FOR MUSCLE SPASMS. (Patient taking differently: Take 20 mg by mouth at bedtime as needed for muscle spasms.) 90 tablet 3   famotidine (PEPCID) 40 MG tablet Take 1 tablet (40 mg total) by mouth at bedtime. (Patient taking differently: Take 40 mg by mouth at bedtime as needed for heartburn or indigestion.) 90 tablet 3   fluticasone (FLONASE) 50 MCG/ACT nasal spray PLACE 2 SPRAYS INTO BOTH NOSTRILS DAILY. (Patient taking differently: Place 2 sprays into  both nostrils daily as needed for allergies.) 16 g 6   LORazepam (ATIVAN) 0.5 MG tablet Take 1 tablet by mouth everyday as needed for severe anxiety (this is a 41-month supply) 20 tablet 0   omeprazole (PRILOSEC) 40 MG capsule TAKE 1 CAPSULE (40 MG TOTAL) BY MOUTH DAILY. (Patient taking differently: Take 40 mg by mouth daily.) 90 capsule 1   potassium chloride SA (KLOR-CON) 20 MEQ tablet TAKE 1 TABLET (20 MEQ TOTAL) BY MOUTH DAILY. TAKE A 2ND TABLET DAILY AS NEEDED (Patient taking differently: Take 20 mEq by mouth daily.) 100 tablet 1   QUEtiapine (SEROQUEL) 200 MG tablet Take 1 tablet by mouth at bedtime 30 tablet 0   rOPINIRole (REQUIP) 1 MG tablet Take 1 tablet by mouth at bedtime 30 tablet 0   rosuvastatin (CRESTOR) 40 MG tablet TAKE 1 TABLET (40 MG TOTAL) BY MOUTH DAILY. 90 tablet 1   torsemide (DEMADEX) 20 MG tablet TAKE 1 TABLET (20 MG TOTAL) BY MOUTH DAILY. (Patient taking differently: Take 20 mg by mouth daily as needed (For fluid).) 30 tablet 2   traZODone (DESYREL) 50 MG tablet Take 1 to 2 tablets by mouth at bedtime as needed for sleep 60 tablet 0   VITAMIN D PO Take 1 tablet by mouth daily.     [DISCONTINUED] pramipexole (MIRAPEX) 0.25 MG tablet Take 1-2 tablets (0.25-0.5 mg total) by mouth at bedtime. 60 tablet 2   No current facility-administered medications on file prior to visit.     Objective:  Objective  Physical Exam Constitutional:      General: She is not in acute distress.    Appearance: Normal appearance. She is not ill-appearing or toxic-appearing.  HENT:     Head: Normocephalic and atraumatic.     Right Ear: Tympanic membrane, ear canal and external ear normal.     Left Ear: Tympanic membrane, ear canal and external ear normal.     Nose: No congestion or rhinorrhea.  Eyes:     Extraocular Movements: Extraocular movements intact.     Pupils: Pupils are equal, round, and reactive to light.  Cardiovascular:     Rate and Rhythm: Normal rate and regular rhythm.      Pulses: Normal pulses.     Heart sounds: Normal heart sounds. No murmur heard. Pulmonary:     Effort: Pulmonary effort is normal. No respiratory distress.     Breath sounds: Normal breath sounds. No wheezing, rhonchi or rales.  Abdominal:     General: Bowel sounds are normal.     Palpations: Abdomen is soft. There is no mass.     Tenderness: There is no abdominal tenderness. There is no guarding.     Hernia: No hernia is present.  Musculoskeletal:        General: Normal range of motion.     Cervical back: Normal range of motion and neck supple.  Skin:    General: Skin is warm and dry.  Neurological:     Mental Status: She is alert and oriented to  person, place, and time.  Psychiatric:        Behavior: Behavior normal.   BP 112/64    Pulse 80    Temp (!) 97.3 F (36.3 C)    Resp 16    Ht 5\' 4"  (1.626 m)    Wt 193 lb 3.2 oz (87.6 kg)    LMP 10/03/2012    SpO2 98%    BMI 33.16 kg/m  Wt Readings from Last 3 Encounters:  06/25/21 193 lb 3.2 oz (87.6 kg)  05/04/21 201 lb 3.2 oz (91.3 kg)  06/24/20 201 lb 6.4 oz (91.4 kg)     Lab Results  Component Value Date   WBC 6.8 05/23/2021   HGB 14.4 05/23/2021   HCT 44.7 05/23/2021   PLT 257 05/23/2021   GLUCOSE 100 (H) 05/23/2021   CHOL 129 05/23/2021   TRIG 104 05/23/2021   HDL 49 05/23/2021   LDLDIRECT 79.0 04/16/2019   LDLCALC 59 05/23/2021   ALT 23 05/23/2021   AST 22 05/23/2021   NA 137 05/23/2021   K 4.1 05/23/2021   CL 104 05/23/2021   CREATININE 0.68 05/23/2021   BUN 6 (L) 05/23/2021   CO2 26 05/23/2021   TSH 0.934 05/23/2021   HGBA1C 5.4 05/06/2021    No results found.   Assessment & Plan:  Plan    No orders of the defined types were placed in this encounter.   Problem List Items Addressed This Visit     Depression with anxiety    Her divorce is final and that has helped her anxiety and depression. She is struggling with the stress of living in an apartment for the first time. Her dog is having trouble  adjusting to apartment life as well. No changes for now      Hyperlipidemia    Encourage heart healthy diet such as MIND or DASH diet, increase exercise, avoid trans fats, simple carbohydrates and processed foods, consider a krill or fish or flaxseed oil cap daily.       Insomnia    Working with psychiatry. Dr Gaetano Net, [facility] and is doing well on current meds. She has an appt with their therapist Cheryll Cockayne today      RLS (restless legs syndrome)    Tolerating Requip      Hyperglycemia    hgba1c acceptable, minimize simple carbs. Increase exercise as tolerated.       Muscle cramp    Hydrate and monitor      GAD (generalized anxiety disorder)    following with psychiatry and doing well      Other Visit Diagnoses     High risk medication use    -  Primary   Long-term current use of benzodiazepine           Follow-up: Return in about 6 months (around 12/24/2021) for medicare with a physical.  I, Suezanne Jacquet, acting as a scribe for Penni Homans, MD, have documented all relevent documentation on behalf of Penni Homans, MD, as directed by Penni Homans, MD while in the presence of Penni Homans, MD. DO:06/25/21.  I, Mosie Lukes, MD personally performed the services described in this documentation. All medical record entries made by the scribe were at my direction and in my presence. I have reviewed the chart and agree that the record reflects my personal performance and is accurate and complete

## 2021-06-25 NOTE — Assessment & Plan Note (Signed)
Her divorce is final and that has helped her anxiety and depression. She is struggling with the stress of living in an apartment for the first time. Her dog is having trouble adjusting to apartment life as well. No changes for now

## 2021-06-25 NOTE — Assessment & Plan Note (Signed)
following with psychiatry and doing well

## 2021-06-25 NOTE — Assessment & Plan Note (Signed)
Hydrate and monitor 

## 2021-06-25 NOTE — Assessment & Plan Note (Signed)
Tolerating Requip

## 2021-06-29 ENCOUNTER — Other Ambulatory Visit (HOSPITAL_BASED_OUTPATIENT_CLINIC_OR_DEPARTMENT_OTHER): Payer: Self-pay

## 2021-07-15 ENCOUNTER — Other Ambulatory Visit (HOSPITAL_BASED_OUTPATIENT_CLINIC_OR_DEPARTMENT_OTHER): Payer: Self-pay

## 2021-07-15 DIAGNOSIS — F23 Brief psychotic disorder: Secondary | ICD-10-CM | POA: Diagnosis not present

## 2021-07-15 DIAGNOSIS — F5102 Adjustment insomnia: Secondary | ICD-10-CM | POA: Diagnosis not present

## 2021-07-15 DIAGNOSIS — F4323 Adjustment disorder with mixed anxiety and depressed mood: Secondary | ICD-10-CM | POA: Diagnosis not present

## 2021-07-15 MED ORDER — ROPINIROLE HCL 1 MG PO TABS
ORAL_TABLET | ORAL | 0 refills | Status: DC
  Filled 2021-07-15 – 2021-07-30 (×2): qty 30, 30d supply, fill #0

## 2021-07-15 MED ORDER — TRAZODONE HCL 50 MG PO TABS
ORAL_TABLET | ORAL | 0 refills | Status: DC
  Filled 2021-07-30: qty 60, 30d supply, fill #0

## 2021-07-15 MED ORDER — LORAZEPAM 0.5 MG PO TABS
ORAL_TABLET | ORAL | 0 refills | Status: DC
  Filled 2021-07-15 – 2021-07-30 (×2): qty 20, 30d supply, fill #0

## 2021-07-15 MED ORDER — QUETIAPINE FUMARATE 200 MG PO TABS
ORAL_TABLET | ORAL | 0 refills | Status: DC
  Filled 2021-07-30: qty 30, 30d supply, fill #0

## 2021-07-23 DIAGNOSIS — F23 Brief psychotic disorder: Secondary | ICD-10-CM | POA: Diagnosis not present

## 2021-07-23 DIAGNOSIS — F5102 Adjustment insomnia: Secondary | ICD-10-CM | POA: Diagnosis not present

## 2021-07-23 DIAGNOSIS — F4323 Adjustment disorder with mixed anxiety and depressed mood: Secondary | ICD-10-CM | POA: Diagnosis not present

## 2021-07-30 ENCOUNTER — Other Ambulatory Visit (HOSPITAL_BASED_OUTPATIENT_CLINIC_OR_DEPARTMENT_OTHER): Payer: Self-pay

## 2021-08-12 ENCOUNTER — Other Ambulatory Visit (HOSPITAL_BASED_OUTPATIENT_CLINIC_OR_DEPARTMENT_OTHER): Payer: Self-pay

## 2021-08-12 DIAGNOSIS — F5102 Adjustment insomnia: Secondary | ICD-10-CM | POA: Diagnosis not present

## 2021-08-12 DIAGNOSIS — F23 Brief psychotic disorder: Secondary | ICD-10-CM | POA: Diagnosis not present

## 2021-08-12 DIAGNOSIS — F4323 Adjustment disorder with mixed anxiety and depressed mood: Secondary | ICD-10-CM | POA: Diagnosis not present

## 2021-08-12 MED ORDER — ROPINIROLE HCL 1 MG PO TABS
ORAL_TABLET | ORAL | 0 refills | Status: DC
  Filled 2021-08-28: qty 30, 30d supply, fill #0

## 2021-08-12 MED ORDER — TRAZODONE HCL 50 MG PO TABS
ORAL_TABLET | ORAL | 0 refills | Status: DC
  Filled 2021-09-02: qty 60, 30d supply, fill #0

## 2021-08-12 MED ORDER — QUETIAPINE FUMARATE 200 MG PO TABS
ORAL_TABLET | ORAL | 0 refills | Status: DC
  Filled 2021-08-28: qty 30, 30d supply, fill #0

## 2021-08-19 DIAGNOSIS — F23 Brief psychotic disorder: Secondary | ICD-10-CM | POA: Diagnosis not present

## 2021-08-19 DIAGNOSIS — F4323 Adjustment disorder with mixed anxiety and depressed mood: Secondary | ICD-10-CM | POA: Diagnosis not present

## 2021-08-19 DIAGNOSIS — F5102 Adjustment insomnia: Secondary | ICD-10-CM | POA: Diagnosis not present

## 2021-08-21 ENCOUNTER — Telehealth: Payer: Self-pay | Admitting: Family Medicine

## 2021-08-21 NOTE — Telephone Encounter (Signed)
Patient would like a refill on her nausea medicine Promethazine. Unable to find on med list. Please advise.   [facility]  [address] [place], [place] [address]  Phone:  [phone]  Fax:  [phone]

## 2021-08-23 ENCOUNTER — Other Ambulatory Visit: Payer: Self-pay | Admitting: Family Medicine

## 2021-08-23 MED ORDER — PROMETHAZINE HCL 12.5 MG PO TABS
12.5000 mg | ORAL_TABLET | Freq: Three times a day (TID) | ORAL | 1 refills | Status: DC | PRN
  Filled 2021-08-23: qty 30, 10d supply, fill #0

## 2021-08-24 ENCOUNTER — Other Ambulatory Visit (HOSPITAL_BASED_OUTPATIENT_CLINIC_OR_DEPARTMENT_OTHER): Payer: Self-pay

## 2021-08-24 NOTE — Telephone Encounter (Signed)
Pt aware.

## 2021-08-28 ENCOUNTER — Other Ambulatory Visit (HOSPITAL_BASED_OUTPATIENT_CLINIC_OR_DEPARTMENT_OTHER): Payer: Self-pay

## 2021-09-02 ENCOUNTER — Other Ambulatory Visit (HOSPITAL_BASED_OUTPATIENT_CLINIC_OR_DEPARTMENT_OTHER): Payer: Self-pay

## 2021-09-07 ENCOUNTER — Other Ambulatory Visit (HOSPITAL_BASED_OUTPATIENT_CLINIC_OR_DEPARTMENT_OTHER): Payer: Self-pay

## 2021-09-09 ENCOUNTER — Other Ambulatory Visit (HOSPITAL_BASED_OUTPATIENT_CLINIC_OR_DEPARTMENT_OTHER): Payer: Self-pay

## 2021-09-09 DIAGNOSIS — F23 Brief psychotic disorder: Secondary | ICD-10-CM | POA: Diagnosis not present

## 2021-09-09 DIAGNOSIS — F5102 Adjustment insomnia: Secondary | ICD-10-CM | POA: Diagnosis not present

## 2021-09-09 DIAGNOSIS — F4323 Adjustment disorder with mixed anxiety and depressed mood: Secondary | ICD-10-CM | POA: Diagnosis not present

## 2021-09-09 MED ORDER — LORAZEPAM 0.5 MG PO TABS
ORAL_TABLET | ORAL | 0 refills | Status: DC
  Filled 2021-09-09: qty 20, 30d supply, fill #0

## 2021-09-09 MED ORDER — ROPINIROLE HCL 1 MG PO TABS
ORAL_TABLET | ORAL | 0 refills | Status: DC
  Filled 2021-09-23: qty 30, 30d supply, fill #0

## 2021-09-09 MED ORDER — TRAZODONE HCL 50 MG PO TABS
ORAL_TABLET | ORAL | 0 refills | Status: DC
  Filled 2021-09-09 – 2021-09-28 (×2): qty 60, 30d supply, fill #0

## 2021-09-09 MED ORDER — QUETIAPINE FUMARATE 200 MG PO TABS
ORAL_TABLET | ORAL | 0 refills | Status: DC
  Filled 2021-09-09 – 2021-09-21 (×2): qty 30, 30d supply, fill #0

## 2021-09-10 ENCOUNTER — Other Ambulatory Visit (HOSPITAL_BASED_OUTPATIENT_CLINIC_OR_DEPARTMENT_OTHER): Payer: Self-pay

## 2021-09-11 ENCOUNTER — Other Ambulatory Visit (HOSPITAL_BASED_OUTPATIENT_CLINIC_OR_DEPARTMENT_OTHER): Payer: Self-pay

## 2021-09-21 ENCOUNTER — Other Ambulatory Visit (HOSPITAL_BASED_OUTPATIENT_CLINIC_OR_DEPARTMENT_OTHER): Payer: Self-pay

## 2021-09-23 ENCOUNTER — Other Ambulatory Visit: Payer: Self-pay

## 2021-09-23 ENCOUNTER — Telehealth: Payer: Self-pay | Admitting: Family Medicine

## 2021-09-23 ENCOUNTER — Other Ambulatory Visit: Payer: Self-pay | Admitting: Family Medicine

## 2021-09-23 ENCOUNTER — Other Ambulatory Visit (HOSPITAL_BASED_OUTPATIENT_CLINIC_OR_DEPARTMENT_OTHER): Payer: Self-pay

## 2021-09-23 DIAGNOSIS — R252 Cramp and spasm: Secondary | ICD-10-CM

## 2021-09-23 MED ORDER — TORSEMIDE 20 MG PO TABS
20.0000 mg | ORAL_TABLET | Freq: Every day | ORAL | 2 refills | Status: DC | PRN
  Filled 2021-09-23: qty 30, 30d supply, fill #0

## 2021-09-23 MED ORDER — BACLOFEN 20 MG PO TABS
ORAL_TABLET | ORAL | 1 refills | Status: DC
  Filled 2021-09-23: qty 90, 90d supply, fill #0

## 2021-09-23 NOTE — Telephone Encounter (Signed)
Refill sent.

## 2021-09-23 NOTE — Telephone Encounter (Signed)
Pt states she has been taking baclofen for muscle spasms and pharmacy is not will to prescribe it and she does not understand why. Please advise.  ? ?[facility]  ?[address] [place], [place] [address]  ?Phone:  [phone]  Fax:  [phone]  ?

## 2021-09-28 ENCOUNTER — Other Ambulatory Visit (HOSPITAL_BASED_OUTPATIENT_CLINIC_OR_DEPARTMENT_OTHER): Payer: Self-pay

## 2021-10-07 ENCOUNTER — Other Ambulatory Visit (HOSPITAL_BASED_OUTPATIENT_CLINIC_OR_DEPARTMENT_OTHER): Payer: Self-pay

## 2021-10-07 DIAGNOSIS — F5102 Adjustment insomnia: Secondary | ICD-10-CM | POA: Diagnosis not present

## 2021-10-07 DIAGNOSIS — F23 Brief psychotic disorder: Secondary | ICD-10-CM | POA: Diagnosis not present

## 2021-10-07 DIAGNOSIS — F4323 Adjustment disorder with mixed anxiety and depressed mood: Secondary | ICD-10-CM | POA: Diagnosis not present

## 2021-10-07 MED ORDER — QUETIAPINE FUMARATE 200 MG PO TABS
ORAL_TABLET | ORAL | 0 refills | Status: DC
Start: 2021-10-07 — End: 2022-07-22
  Filled 2021-10-26: qty 60, 60d supply, fill #0

## 2021-10-07 MED ORDER — LORAZEPAM 0.5 MG PO TABS
ORAL_TABLET | ORAL | 1 refills | Status: DC
  Filled 2021-11-10: qty 20, 30d supply, fill #0

## 2021-10-07 MED ORDER — TRAZODONE HCL 50 MG PO TABS
ORAL_TABLET | ORAL | 0 refills | Status: DC
  Filled 2021-10-30: qty 120, 30d supply, fill #0

## 2021-10-07 MED ORDER — ROPINIROLE HCL 1 MG PO TABS
ORAL_TABLET | ORAL | 0 refills | Status: DC
  Filled 2021-10-26: qty 60, 60d supply, fill #0

## 2021-10-26 ENCOUNTER — Other Ambulatory Visit: Payer: Self-pay | Admitting: Family Medicine

## 2021-10-26 ENCOUNTER — Other Ambulatory Visit (HOSPITAL_BASED_OUTPATIENT_CLINIC_OR_DEPARTMENT_OTHER): Payer: Self-pay

## 2021-10-26 MED ORDER — FAMOTIDINE 40 MG PO TABS
40.0000 mg | ORAL_TABLET | Freq: Every day | ORAL | 3 refills | Status: DC
  Filled 2021-10-26: qty 90, 90d supply, fill #0

## 2021-10-30 ENCOUNTER — Other Ambulatory Visit (HOSPITAL_BASED_OUTPATIENT_CLINIC_OR_DEPARTMENT_OTHER): Payer: Self-pay

## 2021-11-02 ENCOUNTER — Ambulatory Visit: Payer: Medicare Other | Admitting: Family Medicine

## 2021-11-10 ENCOUNTER — Other Ambulatory Visit (HOSPITAL_BASED_OUTPATIENT_CLINIC_OR_DEPARTMENT_OTHER): Payer: Self-pay

## 2021-11-11 DIAGNOSIS — F5102 Adjustment insomnia: Secondary | ICD-10-CM | POA: Diagnosis not present

## 2021-11-11 DIAGNOSIS — F23 Brief psychotic disorder: Secondary | ICD-10-CM | POA: Diagnosis not present

## 2021-11-11 DIAGNOSIS — F4323 Adjustment disorder with mixed anxiety and depressed mood: Secondary | ICD-10-CM | POA: Diagnosis not present

## 2021-12-09 ENCOUNTER — Telehealth: Payer: Self-pay | Admitting: Family Medicine

## 2021-12-09 DIAGNOSIS — R252 Cramp and spasm: Secondary | ICD-10-CM

## 2021-12-09 MED ORDER — BACLOFEN 20 MG PO TABS: ORAL_TABLET | ORAL | 1 refills | Status: DC

## 2021-12-09 MED ORDER — ROSUVASTATIN CALCIUM 40 MG PO TABS: ORAL_TABLET | Freq: Every day | ORAL | 1 refills | Status: DC

## 2021-12-09 MED ORDER — POTASSIUM CHLORIDE CRYS ER 20 MEQ PO TBCR: EXTENDED_RELEASE_TABLET | ORAL | 1 refills | Status: DC

## 2021-12-09 NOTE — Telephone Encounter (Signed)
Pt is switching pharmacy's and would like these rx sent there to be picked. The only she is needing right now is her cholesterol medication.   Medication: baclofen (LIORESAL) 20 MG tablet  potassium chloride SA (KLOR-CON M) 20 MEQ tablet   rosuvastatin (CRESTOR) 40 MG tablet  Has the patient contacted their pharmacy? No.   Preferred Pharmacy: [facility], [place], [place] [address]

## 2021-12-09 NOTE — Telephone Encounter (Signed)
Refills sent

## 2021-12-10 DIAGNOSIS — F23 Brief psychotic disorder: Secondary | ICD-10-CM | POA: Diagnosis not present

## 2021-12-10 DIAGNOSIS — F5102 Adjustment insomnia: Secondary | ICD-10-CM | POA: Diagnosis not present

## 2021-12-10 DIAGNOSIS — F4323 Adjustment disorder with mixed anxiety and depressed mood: Secondary | ICD-10-CM | POA: Diagnosis not present

## 2022-01-06 NOTE — Progress Notes (Unsigned)
Subjective:    Patient ID: [name], female    DOB: May 18, 1956, 66 y.o.   MRN: [id number]  No chief complaint on file.   HPI Patient is in today for a yearly medicare exam.  Past Medical History:  Diagnosis Date   Acute sinusitis 03/27/2013   Allergy    seasonal   Anxiety    Arthritis 06/09/2013   Depression    Dyspareunia    Edema extremities    Encounter for preventative adult health care exam with abnormal findings 04/21/2014   GERD (gastroesophageal reflux disease)    Hip pain, left 11/01/2011   History of proteinuria syndrome    Hyperlipidemia    Insomnia 03/11/2013   Low back pain 05/09/2011   Lymphadenitis 06/16/2012   Myalgia and myositis 02/15/2015   Obese    Onychomycosis 05/09/2011   Oral lesion 08/26/2013   Otitis externa 06/23/2012   left   Perimenopause 03/11/2013   RLS (restless legs syndrome)    RUQ pain 09/20/2011   Sleep apnea 04/21/2014   Tinea corporis 04/26/2017   Unspecified constipation 05/13/2013   Urinary frequency 08/26/2013    Past Surgical History:  Procedure Laterality Date   BACK SURGERY  2006   low, discectomy for herniated disc   BLADDER SUSPENSION N/A 11/07/2012   Procedure: [facility];  Surgeon: Malka So, MD;  Location: [facility];  Service: Urology;  Laterality: N/A;   CARPAL TUNNEL RELEASE Right 2006   CESAREAN SECTION  1993   CYSTOSCOPY N/A 11/07/2012   Procedure: CYSTOSCOPY;  Surgeon: Malka So, MD;  Location: [facility];  Service: Urology;  Laterality: N/A;   TUBAL LIGATION  1993    Family History  Problem Relation Age of Onset   Cancer Mother 67       thyroid, malignant brain tumor   Arthritis Mother        neck, back   Hepatitis Sister        Hepatitis C   Heart disease Sister        stent   Diabetes Maternal Grandmother    Arthritis Father        back pain   Heart disease Father        stent   Breast cancer Maternal Aunt     Social History   Socioeconomic History    Marital status: Married    Spouse name: Not on file   Number of children: Not on file   Years of education: Not on file   Highest education level: Not on file  Occupational History   Not on file  Tobacco Use   Smoking status: Former    Packs/day: 0.50    Years: 20.00    Total pack years: 10.00    Types: Cigarettes    Quit date: 07/12/1992    Years since quitting: 29.5   Smokeless tobacco: Never  Substance and Sexual Activity   Alcohol use: Yes    Comment: occasionaly   Drug use: No   Sexual activity: Yes    Partners: Male  Other Topics Concern   Not on file  Social History Narrative   Not on file   Social Determinants of Health   Financial Resource Strain: Not on file  Food Insecurity: Not on file  Transportation Needs: Not on file  Physical Activity: Not on file  Stress: Not on file  Social Connections: Not on file  Intimate Partner Violence: Not on file  Outpatient Medications Prior to Visit  Medication Sig Dispense Refill   acetaminophen (TYLENOL) 500 MG tablet Take 1,000 mg by mouth every 6 (six) hours as needed for headache or mild pain.     albuterol (VENTOLIN HFA) 108 (90 Base) MCG/ACT inhaler INHALE 1 - 2 PUFFS INTO THE LUNGS EVERY 4 HOURS AS NEEDED FOR COUGH, WHEEZING, OR CHEST TIGHTNESS (Patient taking differently: Inhale 1-2 puffs into the lungs every 4 (four) hours as needed (For cough, wheezing or chest tightness).) 18 g 3   Ascorbic Acid (VITAMIN C PO) Take 1 tablet by mouth daily.     baclofen (LIORESAL) 20 MG tablet TAKE 1 TABLET (20 MG) BY MOUTH AT BEDTIME AS NEEDED FOR MUSCLE SPASMS. 90 tablet 1   famotidine (PEPCID) 40 MG tablet Take 1 tablet (40 mg total) by mouth at bedtime. 90 tablet 3   fluticasone (FLONASE) 50 MCG/ACT nasal spray PLACE 2 SPRAYS INTO BOTH NOSTRILS DAILY. (Patient taking differently: Place 2 sprays into both nostrils daily as needed for allergies.) 16 g 6   LORazepam (ATIVAN) 0.5 MG tablet Take 1 tablet by mouth everyday as  needed for severe anxiety (this is a 64-monthsupply) 20 tablet 0   LORazepam (ATIVAN) 0.5 MG tablet Take1 tablet by mouth daily as needed for severe anxiety (this is a 113-monthupply) 20 tablet 1   omeprazole (PRILOSEC) 40 MG capsule TAKE 1 CAPSULE (40 MG TOTAL) BY MOUTH DAILY. (Patient taking differently: Take 40 mg by mouth daily.) 90 capsule 1   potassium chloride SA (KLOR-CON M) 20 MEQ tablet TAKE 1 TABLET (20 MEQ TOTAL) BY MOUTH DAILY. TAKE A 2ND TABLET DAILY AS NEEDED 100 tablet 1   promethazine (PHENERGAN) 12.5 MG tablet Take 1 tablet (12.5 mg total) by mouth every 8 (eight) hours as needed for nausea or vomiting. 30 tablet 1   QUEtiapine (SEROQUEL) 200 MG tablet Take 1 tablet by mouth at bedtime 60 tablet 0   rOPINIRole (REQUIP) 1 MG tablet Take 1 tablet by mouth at bedtime 60 tablet 0   rosuvastatin (CRESTOR) 40 MG tablet TAKE 1 TABLET (40 MG TOTAL) BY MOUTH DAILY. 90 tablet 1   torsemide (DEMADEX) 20 MG tablet Take 1 tablet (20 mg total) by mouth daily as needed (fluid). 30 tablet 2   traZODone (DESYREL) 50 MG tablet Take 1-2 tablets by mouth at bedtime for sleep as needed 120 tablet 0   VITAMIN D PO Take 1 tablet by mouth daily.     No facility-administered medications prior to visit.    Allergies  Allergen Reactions   Elemental Sulfur Diarrhea and Nausea And Vomiting   Carbamazepine Hives   Ceclor [Cefaclor] Other (See Comments)    Tongue swell   Morphine And Related Itching   Atorvastatin Other (See Comments)    Leg pain    ROS     Objective:    Physical Exam  LMP 10/03/2012  Wt Readings from Last 3 Encounters:  06/25/21 193 lb 3.2 oz (87.6 kg)  05/04/21 201 lb 3.2 oz (91.3 kg)  06/24/20 201 lb 6.4 oz (91.4 kg)    Diabetic Foot Exam - Simple   No data filed    Lab Results  Component Value Date   WBC 6.8 05/23/2021   HGB 14.4 05/23/2021   HCT 44.7 05/23/2021   PLT 257 05/23/2021   GLUCOSE 100 (H) 05/23/2021   CHOL 129 05/23/2021   TRIG 104 05/23/2021    HDL 49 05/23/2021   LDLDIRECT 79.0 04/16/2019  LDLCALC 59 05/23/2021   ALT 23 05/23/2021   AST 22 05/23/2021   NA 137 05/23/2021   K 4.1 05/23/2021   CL 104 05/23/2021   CREATININE 0.68 05/23/2021   BUN 6 (L) 05/23/2021   CO2 26 05/23/2021   TSH 0.934 05/23/2021   HGBA1C 5.4 05/06/2021    Lab Results  Component Value Date   TSH 0.934 05/23/2021   Lab Results  Component Value Date   WBC 6.8 05/23/2021   HGB 14.4 05/23/2021   HCT 44.7 05/23/2021   MCV 89.8 05/23/2021   PLT 257 05/23/2021   Lab Results  Component Value Date   NA 137 05/23/2021   K 4.1 05/23/2021   CO2 26 05/23/2021   GLUCOSE 100 (H) 05/23/2021   BUN 6 (L) 05/23/2021   CREATININE 0.68 05/23/2021   BILITOT 1.2 05/23/2021   ALKPHOS 41 05/23/2021   AST 22 05/23/2021   ALT 23 05/23/2021   PROT 7.2 05/23/2021   ALBUMIN 4.5 05/23/2021   CALCIUM 9.3 05/23/2021   ANIONGAP 7 05/23/2021   GFR 84.78 05/06/2021   Lab Results  Component Value Date   CHOL 129 05/23/2021   Lab Results  Component Value Date   HDL 49 05/23/2021   Lab Results  Component Value Date   LDLCALC 59 05/23/2021   Lab Results  Component Value Date   TRIG 104 05/23/2021   Lab Results  Component Value Date   CHOLHDL 2.6 05/23/2021   Lab Results  Component Value Date   HGBA1C 5.4 05/06/2021       Assessment & Plan:   COLONOSCOPY: 01/2009 MAMMO: 08/2020 PAP: 08/2016 PSA: n/a DEXA: 04/2015   Problem List Items Addressed This Visit   None   I am having Rosaline S. Guise maintain her fluticasone, albuterol, omeprazole, acetaminophen, VITAMIN D PO, Ascorbic Acid (VITAMIN C PO), LORazepam, promethazine, torsemide, LORazepam, rOPINIRole, QUEtiapine, traZODone, famotidine, baclofen, potassium chloride SA, and rosuvastatin.  No orders of the defined types were placed in this encounter.

## 2022-01-07 ENCOUNTER — Encounter: Payer: Self-pay | Admitting: Family Medicine

## 2022-01-07 ENCOUNTER — Ambulatory Visit (INDEPENDENT_AMBULATORY_CARE_PROVIDER_SITE_OTHER): Payer: Medicare Other | Admitting: Family Medicine

## 2022-01-07 VITALS — BP 110/80 | HR 69 | Resp 20 | Ht 64.0 in | Wt 194.0 lb

## 2022-01-07 DIAGNOSIS — I959 Hypotension, unspecified: Secondary | ICD-10-CM | POA: Diagnosis not present

## 2022-01-07 DIAGNOSIS — Z1231 Encounter for screening mammogram for malignant neoplasm of breast: Secondary | ICD-10-CM | POA: Diagnosis not present

## 2022-01-07 DIAGNOSIS — Z1211 Encounter for screening for malignant neoplasm of colon: Secondary | ICD-10-CM

## 2022-01-07 DIAGNOSIS — G4700 Insomnia, unspecified: Secondary | ICD-10-CM | POA: Diagnosis not present

## 2022-01-07 DIAGNOSIS — F4310 Post-traumatic stress disorder, unspecified: Secondary | ICD-10-CM | POA: Diagnosis not present

## 2022-01-07 DIAGNOSIS — K219 Gastro-esophageal reflux disease without esophagitis: Secondary | ICD-10-CM | POA: Diagnosis not present

## 2022-01-07 DIAGNOSIS — E782 Mixed hyperlipidemia: Secondary | ICD-10-CM | POA: Diagnosis not present

## 2022-01-07 DIAGNOSIS — R739 Hyperglycemia, unspecified: Secondary | ICD-10-CM

## 2022-01-07 DIAGNOSIS — Z0000 Encounter for general adult medical examination without abnormal findings: Secondary | ICD-10-CM

## 2022-01-07 DIAGNOSIS — F418 Other specified anxiety disorders: Secondary | ICD-10-CM

## 2022-01-07 DIAGNOSIS — E669 Obesity, unspecified: Secondary | ICD-10-CM | POA: Diagnosis not present

## 2022-01-07 LAB — CBC
HCT: 42.4 % (ref 36.0–46.0)
Hemoglobin: 14 g/dL (ref 12.0–15.0)
MCHC: 33 g/dL (ref 30.0–36.0)
MCV: 90 fl (ref 78.0–100.0)
Platelets: 191 10*3/uL (ref 150.0–400.0)
RBC: 4.72 Mil/uL (ref 3.87–5.11)
RDW: 13.7 % (ref 11.5–15.5)
WBC: 6.2 10*3/uL (ref 4.0–10.5)

## 2022-01-07 LAB — LIPID PANEL
Cholesterol: 159 mg/dL (ref 0–200)
HDL: 55 mg/dL (ref >39.00)
LDL Cholesterol: 66 mg/dL (ref 0–99)
NonHDL: 104.38
Total CHOL/HDL Ratio: 3
Triglycerides: 191 mg/dL — ABNORMAL HIGH (ref 0.0–149.0)
VLDL: 38.2 mg/dL (ref 0.0–40.0)

## 2022-01-07 LAB — COMPREHENSIVE METABOLIC PANEL
ALT: 17 U/L (ref 0–35)
AST: 16 U/L (ref 0–37)
Albumin: 4.5 g/dL (ref 3.5–5.2)
Alkaline Phosphatase: 49 U/L (ref 39–117)
BUN: 14 mg/dL (ref 6–23)
CO2: 28 mEq/L (ref 19–32)
Calcium: 9.3 mg/dL (ref 8.4–10.5)
Chloride: 104 mEq/L (ref 96–112)
Creatinine, Ser: 0.8 mg/dL (ref 0.40–1.20)
GFR: 78.09 mL/min (ref >60.00)
Glucose, Bld: 81 mg/dL (ref 70–99)
Potassium: 4.4 mEq/L (ref 3.5–5.1)
Sodium: 140 mEq/L (ref 135–145)
Total Bilirubin: 1 mg/dL (ref 0.2–1.2)
Total Protein: 6.6 g/dL (ref 6.0–8.3)

## 2022-01-07 LAB — TSH: TSH: 1.06 u[IU]/mL (ref 0.35–5.50)

## 2022-01-07 LAB — HEMOGLOBIN A1C: Hgb A1c MFr Bld: 5.4 % (ref 4.6–6.5)

## 2022-01-07 MED ORDER — TORSEMIDE 20 MG PO TABS: 20.0000 mg | ORAL_TABLET | Freq: Every day | ORAL | 3 refills | Status: DC | PRN

## 2022-01-07 MED ORDER — FAMOTIDINE 40 MG PO TABS: 40.0000 mg | ORAL_TABLET | Freq: Every day | ORAL | 3 refills | Status: DC

## 2022-01-07 MED ORDER — PROMETHAZINE HCL 12.5 MG PO TABS: 12.5000 mg | ORAL_TABLET | Freq: Three times a day (TID) | ORAL | 1 refills | Status: DC | PRN

## 2022-01-07 MED ORDER — FLUTICASONE PROPIONATE 50 MCG/ACT NA SUSP: 2.0000 | Freq: Every day | NASAL | 1 refills | Status: DC | PRN

## 2022-01-07 NOTE — Assessment & Plan Note (Signed)
hgba1c acceptable, minimize simple carbs. Increase exercise as tolerated.  

## 2022-01-07 NOTE — Assessment & Plan Note (Signed)
MGM ordered. Referred for screening colonoscopy

## 2022-01-07 NOTE — Assessment & Plan Note (Signed)
Avoid offending foods, start probiotics. Do not eat large meals in late evening and consider raising head of bed. Refill on Famotidine

## 2022-01-07 NOTE — Assessment & Plan Note (Signed)
Encouraged DASH or MIND diet, decrease po intake and increase exercise as tolerated. Needs 7-8 hours of sleep nightly. Avoid trans fats, eat small, frequent meals every 4-5 hours with lean proteins, complex carbs and healthy fats. Minimize simple carbs, high fat foods and processed foods 

## 2022-01-07 NOTE — Assessment & Plan Note (Signed)
Encourage heart healthy diet such as MIND or DASH diet, increase exercise, avoid trans fats, simple carbohydrates and processed foods, consider a krill or fish or flaxseed oil cap daily.  °

## 2022-01-07 NOTE — Assessment & Plan Note (Signed)
Is staying active with her church friends and is doing better. Her ex husband is leaving her alone so she is doing better.

## 2022-01-07 NOTE — Assessment & Plan Note (Signed)
Is doing some better after her divorce. She is in a new home and doing better.

## 2022-01-07 NOTE — Patient Instructions (Addendum)
Shingrix is the new shingles shot, 2 shots over 2-6 months, confirm coverage with insurance and document, then can return here for shots with nurse appt or at pharmacy   Tetanus shot if injured  Dr Lavonia Drafts, OB-GYN   Preventive Care 64-64 Years Old, Female Preventive care refers to lifestyle choices and visits with your health care provider that can promote health and wellness. Preventive care visits are also called wellness exams. What can I expect for my preventive care visit? Counseling Your health care provider may ask you questions about your: Medical history, including: Past medical problems. Family medical history. Pregnancy history. Current health, including: Menstrual cycle. Method of birth control. Emotional well-being. Home life and relationship well-being. Sexual activity and sexual health. Lifestyle, including: Alcohol, nicotine or tobacco, and drug use. Access to firearms. Diet, exercise, and sleep habits. Work and work Statistician. Sunscreen use. Safety issues such as seatbelt and bike helmet use. Physical exam Your health care provider will check your: Height and weight. These may be used to calculate your BMI (body mass index). BMI is a measurement that tells if you are at a healthy weight. Waist circumference. This measures the distance around your waistline. This measurement also tells if you are at a healthy weight and may help predict your risk of certain diseases, such as type 2 diabetes and high blood pressure. Heart rate and blood pressure. Body temperature. Skin for abnormal spots. What immunizations do I need?  Vaccines are usually given at various ages, according to a schedule. Your health care provider will recommend vaccines for you based on your age, medical history, and lifestyle or other factors, such as travel or where you work. What tests do I need? Screening Your health care provider may recommend screening tests for certain  conditions. This may include: Lipid and cholesterol levels. Diabetes screening. This is done by checking your blood sugar (glucose) after you have not eaten for a while (fasting). Pelvic exam and Pap test. Hepatitis B test. Hepatitis C test. HIV (human immunodeficiency virus) test. STI (sexually transmitted infection) testing, if you are at risk. Lung cancer screening. Colorectal cancer screening. Mammogram. Talk with your health care provider about when you should start having regular mammograms. This may depend on whether you have a family history of breast cancer. BRCA-related cancer screening. This may be done if you have a family history of breast, ovarian, tubal, or peritoneal cancers. Bone density scan. This is done to screen for osteoporosis. Talk with your health care provider about your test results, treatment options, and if necessary, the need for more tests. Follow these instructions at home: Eating and drinking  Eat a diet that includes fresh fruits and vegetables, whole grains, lean protein, and low-fat dairy products. Take vitamin and mineral supplements as recommended by your health care provider. Do not drink alcohol if: Your health care provider tells you not to drink. You are pregnant, may be pregnant, or are planning to become pregnant. If you drink alcohol: Limit how much you have to 0-1 drink a day. Know how much alcohol is in your drink. In the [place] one drink equals one 12 oz bottle of beer (355 mL), one 5 oz glass of wine (148 mL), or one 1 oz glass of hard liquor (44 mL). Lifestyle Brush your teeth every morning and night with fluoride toothpaste. Floss one time each day. Exercise for at least 30 minutes 5 or more days each week. Do not use any products that contain nicotine or tobacco. These  products include cigarettes, chewing tobacco, and vaping devices, such as e-cigarettes. If you need help quitting, ask your health care provider. Do not use drugs. If  you are sexually active, practice safe sex. Use a condom or other form of protection to prevent STIs. If you do not wish to become pregnant, use a form of birth control. If you plan to become pregnant, see your health care provider for a prepregnancy visit. Take aspirin only as told by your health care provider. Make sure that you understand how much to take and what form to take. Work with your health care provider to find out whether it is safe and beneficial for you to take aspirin daily. Find healthy ways to manage stress, such as: Meditation, yoga, or listening to music. Journaling. Talking to a trusted person. Spending time with friends and family. Minimize exposure to UV radiation to reduce your risk of skin cancer. Safety Always wear your seat belt while driving or riding in a vehicle. Do not drive: If you have been drinking alcohol. Do not ride with someone who has been drinking. When you are tired or distracted. While texting. If you have been using any mind-altering substances or drugs. Wear a helmet and other protective equipment during sports activities. If you have firearms in your house, make sure you follow all gun safety procedures. Seek help if you have been physically or sexually abused. What's next? Visit your health care provider once a year for an annual wellness visit. Ask your health care provider how often you should have your eyes and teeth checked. Stay up to date on all vaccines. This information is not intended to replace advice given to you by your health care provider. Make sure you discuss any questions you have with your health care provider. Document Revised: 12/24/2020 Document Reviewed: 12/24/2020 Elsevier Patient Education  [employer].

## 2022-01-08 ENCOUNTER — Encounter: Payer: Self-pay | Admitting: *Deleted

## 2022-01-13 DIAGNOSIS — F23 Brief psychotic disorder: Secondary | ICD-10-CM | POA: Diagnosis not present

## 2022-01-13 DIAGNOSIS — F5102 Adjustment insomnia: Secondary | ICD-10-CM | POA: Diagnosis not present

## 2022-01-13 DIAGNOSIS — F4323 Adjustment disorder with mixed anxiety and depressed mood: Secondary | ICD-10-CM | POA: Diagnosis not present

## 2022-03-09 ENCOUNTER — Ambulatory Visit (HOSPITAL_BASED_OUTPATIENT_CLINIC_OR_DEPARTMENT_OTHER): Payer: Medicare Other

## 2022-03-10 DIAGNOSIS — F4323 Adjustment disorder with mixed anxiety and depressed mood: Secondary | ICD-10-CM | POA: Diagnosis not present

## 2022-03-10 DIAGNOSIS — F23 Brief psychotic disorder: Secondary | ICD-10-CM | POA: Diagnosis not present

## 2022-03-10 DIAGNOSIS — F5102 Adjustment insomnia: Secondary | ICD-10-CM | POA: Diagnosis not present

## 2022-03-17 ENCOUNTER — Ambulatory Visit (HOSPITAL_BASED_OUTPATIENT_CLINIC_OR_DEPARTMENT_OTHER)
Admission: RE | Admit: 2022-03-17 | Discharge: 2022-03-17 | Disposition: A | Discharge status: Home / Self Care | Payer: Medicare Other | Source: Ambulatory Visit | Attending: Family Medicine | Admitting: Family Medicine

## 2022-03-17 ENCOUNTER — Encounter (HOSPITAL_BASED_OUTPATIENT_CLINIC_OR_DEPARTMENT_OTHER): Payer: Self-pay

## 2022-03-17 DIAGNOSIS — Z1231 Encounter for screening mammogram for malignant neoplasm of breast: Secondary | ICD-10-CM | POA: Diagnosis not present

## 2022-04-07 DIAGNOSIS — F4323 Adjustment disorder with mixed anxiety and depressed mood: Secondary | ICD-10-CM | POA: Diagnosis not present

## 2022-04-07 DIAGNOSIS — F23 Brief psychotic disorder: Secondary | ICD-10-CM | POA: Diagnosis not present

## 2022-04-07 DIAGNOSIS — F5102 Adjustment insomnia: Secondary | ICD-10-CM | POA: Diagnosis not present

## 2022-04-13 ENCOUNTER — Encounter: Payer: Self-pay | Admitting: Gastroenterology

## 2022-04-27 ENCOUNTER — Ambulatory Visit (AMBULATORY_SURGERY_CENTER): Payer: Self-pay

## 2022-04-27 VITALS — Ht 65.0 in | Wt 208.0 lb

## 2022-04-27 DIAGNOSIS — Z1211 Encounter for screening for malignant neoplasm of colon: Secondary | ICD-10-CM

## 2022-04-27 MED ORDER — PEG 3350-KCL-NA BICARB-NACL 420 G PO SOLR: 4000.0000 mL | Freq: Once | ORAL | 0 refills | Status: AC

## 2022-04-27 NOTE — Progress Notes (Signed)

## 2022-05-11 ENCOUNTER — Ambulatory Visit (INDEPENDENT_AMBULATORY_CARE_PROVIDER_SITE_OTHER): Payer: Medicare Other | Admitting: Family Medicine

## 2022-05-11 ENCOUNTER — Other Ambulatory Visit (HOSPITAL_BASED_OUTPATIENT_CLINIC_OR_DEPARTMENT_OTHER): Payer: Self-pay

## 2022-05-11 ENCOUNTER — Other Ambulatory Visit: Payer: Self-pay

## 2022-05-11 VITALS — BP 128/78 | HR 73 | Temp 98.0°F | Resp 16 | Ht 65.0 in | Wt 211.2 lb

## 2022-05-11 DIAGNOSIS — Z23 Encounter for immunization: Secondary | ICD-10-CM

## 2022-05-11 DIAGNOSIS — H9311 Tinnitus, right ear: Secondary | ICD-10-CM | POA: Diagnosis not present

## 2022-05-11 DIAGNOSIS — M791 Myalgia, unspecified site: Secondary | ICD-10-CM

## 2022-05-11 DIAGNOSIS — E782 Mixed hyperlipidemia: Secondary | ICD-10-CM

## 2022-05-11 DIAGNOSIS — R739 Hyperglycemia, unspecified: Secondary | ICD-10-CM | POA: Diagnosis not present

## 2022-05-11 DIAGNOSIS — R42 Dizziness and giddiness: Secondary | ICD-10-CM | POA: Diagnosis not present

## 2022-05-11 DIAGNOSIS — M545 Low back pain, unspecified: Secondary | ICD-10-CM

## 2022-05-11 DIAGNOSIS — R600 Localized edema: Secondary | ICD-10-CM | POA: Diagnosis not present

## 2022-05-11 LAB — LIPID PANEL
Cholesterol: 163 mg/dL (ref 0–200)
HDL: 52.7 mg/dL (ref >39.00)
NonHDL: 109.84
Total CHOL/HDL Ratio: 3
Triglycerides: 220 mg/dL — ABNORMAL HIGH (ref 0.0–149.0)
VLDL: 44 mg/dL — ABNORMAL HIGH (ref 0.0–40.0)

## 2022-05-11 LAB — CBC
HCT: 42 % (ref 36.0–46.0)
Hemoglobin: 14 g/dL (ref 12.0–15.0)
MCHC: 33.2 g/dL (ref 30.0–36.0)
MCV: 90.1 fl (ref 78.0–100.0)
Platelets: 225 10*3/uL (ref 150.0–400.0)
RBC: 4.66 Mil/uL (ref 3.87–5.11)
RDW: 13 % (ref 11.5–15.5)
WBC: 5.7 10*3/uL (ref 4.0–10.5)

## 2022-05-11 LAB — COMPREHENSIVE METABOLIC PANEL
ALT: 17 U/L (ref 0–35)
AST: 15 U/L (ref 0–37)
Albumin: 4.4 g/dL (ref 3.5–5.2)
Alkaline Phosphatase: 46 U/L (ref 39–117)
BUN: 15 mg/dL (ref 6–23)
CO2: 28 mEq/L (ref 19–32)
Calcium: 9.5 mg/dL (ref 8.4–10.5)
Chloride: 104 mEq/L (ref 96–112)
Creatinine, Ser: 0.78 mg/dL (ref 0.40–1.20)
GFR: 80.31 mL/min (ref >60.00)
Glucose, Bld: 79 mg/dL (ref 70–99)
Potassium: 4 mEq/L (ref 3.5–5.1)
Sodium: 141 mEq/L (ref 135–145)
Total Bilirubin: 0.8 mg/dL (ref 0.2–1.2)
Total Protein: 6.7 g/dL (ref 6.0–8.3)

## 2022-05-11 LAB — LDL CHOLESTEROL, DIRECT: Direct LDL: 82 mg/dL

## 2022-05-11 LAB — HEMOGLOBIN A1C: Hgb A1c MFr Bld: 5.5 % (ref 4.6–6.5)

## 2022-05-11 LAB — TSH: TSH: 1.27 u[IU]/mL (ref 0.35–5.50)

## 2022-05-11 LAB — MAGNESIUM: Magnesium: 2.1 mg/dL (ref 1.5–2.5)

## 2022-05-11 MED ORDER — BOOSTRIX 5-2.5-18.5 LF-MCG/0.5 IM SUSY
PREFILLED_SYRINGE | INTRAMUSCULAR | 0 refills | Status: DC
  Filled 2022-05-11: qty 0.5, 1d supply, fill #0

## 2022-05-11 MED ORDER — PROMETHAZINE HCL 12.5 MG PO TABS: 12.5000 mg | ORAL_TABLET | Freq: Three times a day (TID) | ORAL | 1 refills | Status: DC | PRN

## 2022-05-11 NOTE — Addendum Note (Signed)
Addended by: Laure Kidney on: 05/11/2022 01:40 PM   Modules accepted: Orders

## 2022-05-11 NOTE — Assessment & Plan Note (Addendum)
hgba1c acceptable, minimize simple carbs. Increase exercise as tolerated.  Tetanus due covid booster and flu shot recommended

## 2022-05-11 NOTE — Patient Instructions (Addendum)
[employer] company multivitamin and fish oil Centrum Silver or One a Day multivitamin  Tetanus at Pharmacy   Covid booster   Tinnitus Tinnitus refers to hearing a sound when there is no actual source for that sound. This is often described as ringing in the ears. However, people with this condition may hear a variety of noises, in one ear or in both ears. The sounds of tinnitus can be soft, loud, or somewhere in between. Tinnitus can last for a few seconds or can be constant for days. It may go away without treatment and come back at various times. When tinnitus is constant or happens often, it can lead to other problems, such as trouble sleeping and trouble concentrating. Almost everyone experiences tinnitus at some point. Tinnitus is not the same as hearing loss. Tinnitus that is long-lasting (chronic) or comes back often (recurs) may require medical attention. What are the causes? The cause of tinnitus is often not known. In some cases, it can result from: Exposure to loud noises from machinery, music, or other sources. An object (foreign body) stuck in the ear. Earwax buildup. Drinking alcohol or caffeine. Taking certain medicines. Age-related hearing loss. It may also be caused by medical conditions such as: Ear or sinus infections. Heart diseases or high blood pressure. Allergies. Mnire's disease. Thyroid problems. Tumors. A weak, bulging blood vessel (aneurysm) near the ear. What increases the risk? The following factors may make you more likely to develop this condition: Exposure to loud noises. Age. Tinnitus is more likely in older individuals. Using alcohol or tobacco. What are the signs or symptoms? The main symptom of tinnitus is hearing a sound when there is no source for that sound. It may sound like: Buzzing. Sizzling. Ringing. Blowing air. Hissing. Whistling. Other sounds may include: Roaring. Running water. A musical note. Tapping. Humming. Symptoms may affect  only one ear (unilateral) or both ears (bilateral). How is this diagnosed? Tinnitus is diagnosed based on your symptoms, your medical history, and a physical exam. Your health care provider may do a thorough hearing test (audiologic exam) if your tinnitus: Is unilateral. Causes hearing difficulties. Lasts 6 months or longer. You may work with a health care provider who specializes in hearing disorders (audiologist). You may be asked questions about your symptoms and how they affect your daily life. You may have other tests done, such as: CT scan. MRI. An imaging test of how blood flows through your blood vessels (angiogram). How is this treated? Treating an underlying medical condition can sometimes make tinnitus go away. If your tinnitus continues, other treatments may include: Therapy and counseling to help you manage the stress of living with tinnitus. Sound generators to mask the tinnitus. These include: Tabletop sound machines that play relaxing sounds to help you fall asleep. Wearable devices that fit in your ear and play sounds or music. Acoustic neural stimulation. This involves using headphones to listen to music that contains an auditory signal. Over time, listening to this signal may change some pathways in your brain and make you less sensitive to tinnitus. This treatment is used for very severe cases when no other treatment is working. Using hearing aids or cochlear implants if your tinnitus is related to hearing loss. Hearing aids are worn in the outer ear. Cochlear implants are surgically placed in the inner ear. Follow these instructions at home: Managing symptoms     When possible, avoid being in loud places and being exposed to loud sounds. Wear hearing protection, such as earplugs,  when you are exposed to loud noises. Use a white noise machine, a humidifier, or other devices to mask the sound of tinnitus. Practice techniques for reducing stress, such as meditation, yoga,  or deep breathing. Work with your health care provider if you need help with managing stress. Sleep with your head slightly raised. This may reduce the impact of tinnitus. General instructions Do not use stimulants, such as nicotine, alcohol, or caffeine. Talk with your health care provider about other stimulants to avoid. Stimulants are substances that can make you feel alert and attentive by increasing certain activities in the body (such as heart rate and blood pressure). These substances may make tinnitus worse. Take over-the-counter and prescription medicines only as told by your health care provider. Try to get plenty of sleep each night. Keep all follow-up visits. This is important. Contact a health care provider if: Your tinnitus continues for 3 weeks or longer without stopping. You develop sudden hearing loss. Your symptoms get worse or do not get better with home care. You feel you are not able to manage the stress of living with tinnitus. Get help right away if: You develop tinnitus after a head injury. You have tinnitus along with any of the following: Dizziness. Nausea and vomiting. Loss of balance. Sudden, severe headache. Vision changes. Facial weakness or weakness of arms or legs. These symptoms may represent a serious problem that is an emergency. Do not wait to see if the symptoms will go away. Get medical help right away. Call your local emergency services (911 in the [place] Do not drive yourself to the hospital. Summary Tinnitus refers to hearing a sound when there is no actual source for that sound. This is often described as ringing in the ears. Symptoms may affect only one ear (unilateral) or both ears (bilateral). Use a white noise machine, a humidifier, or other devices to mask the sound of tinnitus. Do not use stimulants, such as nicotine, alcohol, or caffeine. These substances may make tinnitus worse. This information is not intended to replace advice given to  you by your health care provider. Make sure you discuss any questions you have with your health care provider. Document Revised: 06/02/2020 Document Reviewed: 06/02/2020 Elsevier Patient Education  [employer].

## 2022-05-11 NOTE — Progress Notes (Signed)
Subjective:   By signing my name below, I, [name], attest that this documentation has been prepared under the direction and in the presence of Kristen Lukes, MD., 05/11/2022.     Patient ID: [name], female    DOB: 1955-09-29, 66 y.o.   MRN: [id number]  Chief Complaint  Patient presents with   Follow-up    Here for  follow up   HPI Patient is in today for an office visit.  Diet: She does not consume an appropriate amount of protein daily.  Immunizations: She has been informed about receiving COVID-19 and Tetanus immunizations. She will receive the Flu immunization today.  Tinnitus: She reports that her right ear has been ringing steadily since 06/23. She denies hearing loss, pain, or having recent falls. She further reports that she experiences intermittent lightheadedness and feels dizzy if she gets up too quickly.  Supplements: She currently takes vitamin D daily.  Past Medical History:  Diagnosis Date   Acute sinusitis 03/27/2013   Allergy    seasonal   Anxiety    Arthritis 06/09/2013   Depression    Dyspareunia    Edema extremities    Encounter for preventative adult health care exam with abnormal findings 04/21/2014   GERD (gastroesophageal reflux disease)    Hip pain, left 11/01/2011   History of proteinuria syndrome    Hyperlipidemia    Insomnia 03/11/2013   Low back pain 05/09/2011   Lymphadenitis 06/16/2012   Myalgia and myositis 02/15/2015   Obese    Onychomycosis 05/09/2011   Oral lesion 08/26/2013   Otitis externa 06/23/2012   left   Perimenopause 03/11/2013   RLS (restless legs syndrome)    RUQ pain 09/20/2011   Sleep apnea 04/21/2014   Tinea corporis 04/26/2017   Unspecified constipation 05/13/2013   Urinary frequency 08/26/2013   Past Surgical History:  Procedure Laterality Date   BACK SURGERY  2006   low, discectomy for herniated disc   BLADDER SUSPENSION N/A 11/07/2012   Procedure: [facility];  Surgeon: Malka So, MD;   Location: [facility];  Service: Urology;  Laterality: N/A;   CARPAL TUNNEL RELEASE Right 2006   CESAREAN SECTION  1993   CYSTOSCOPY N/A 11/07/2012   Procedure: CYSTOSCOPY;  Surgeon: Malka So, MD;  Location: [facility];  Service: Urology;  Laterality: N/A;   TUBAL LIGATION  1993   Family History  Problem Relation Age of Onset   Cancer Mother 9       thyroid, malignant brain tumor   Arthritis Mother        neck, back   Arthritis Father        back pain   Heart disease Father        stent   Hepatitis Sister        Hepatitis C   Heart disease Sister        stent   Breast cancer Maternal Aunt    Colon polyps Maternal Grandmother    Diabetes Maternal Grandmother    Colon cancer Neg Hx    Rectal cancer Neg Hx    Stomach cancer Neg Hx    Social History   Socioeconomic History   Marital status: Divorced    Spouse name: Not on file   Number of children: Not on file   Years of education: Not on file   Highest education level: Not on file  Occupational History   Not on file  Tobacco Use  Smoking status: Former    Packs/day: 0.50    Years: 20.00    Total pack years: 10.00    Types: Cigarettes    Quit date: 07/12/1992    Years since quitting: 29.8   Smokeless tobacco: Never  Substance and Sexual Activity   Alcohol use: Yes    Comment: occasionaly   Drug use: No   Sexual activity: Yes    Partners: Male  Other Topics Concern   Not on file  Social History Narrative   Not on file   Social Determinants of Health   Financial Resource Strain: Not on file  Food Insecurity: Not on file  Transportation Needs: Not on file  Physical Activity: Not on file  Stress: Not on file  Social Connections: Not on file  Intimate Partner Violence: Not on file   Outpatient Medications Prior to Visit  Medication Sig Dispense Refill   acetaminophen (TYLENOL) 500 MG tablet Take 1,000 mg by mouth every 6 (six) hours as needed for headache or mild pain.      Ascorbic Acid (VITAMIN C PO) Take 1 tablet by mouth daily.     Bacillus Coagulans-Inulin (PROBIOTIC) 1-250 BILLION-MG CAPS Take by mouth.     baclofen (LIORESAL) 20 MG tablet TAKE 1 TABLET (20 MG) BY MOUTH AT BEDTIME AS NEEDED FOR MUSCLE SPASMS. 90 tablet 1   famotidine (PEPCID) 40 MG tablet Take 1 tablet (40 mg total) by mouth at bedtime. 90 tablet 3   fluticasone (FLONASE) 50 MCG/ACT nasal spray Place 2 sprays into both nostrils daily as needed for allergies. 16 g 1   hydrOXYzine (VISTARIL) 25 MG capsule Take 25 mg by mouth daily.     lamoTRIgine (LAMICTAL) 100 MG tablet Take 100 mg by mouth daily.     LORazepam (ATIVAN) 0.5 MG tablet Take1 tablet by mouth daily as needed for severe anxiety (this is a 42-monthsupply) 20 tablet 1   omeprazole (PRILOSEC) 40 MG capsule TAKE 1 CAPSULE (40 MG TOTAL) BY MOUTH DAILY. 90 capsule 1   potassium chloride SA (KLOR-CON M) 20 MEQ tablet TAKE 1 TABLET (20 MEQ TOTAL) BY MOUTH DAILY. TAKE A 2ND TABLET DAILY AS NEEDED 100 tablet 1   QUEtiapine (SEROQUEL) 200 MG tablet Take 1 tablet by mouth at bedtime 60 tablet 0   rOPINIRole (REQUIP) 1 MG tablet Take 1 tablet by mouth at bedtime 60 tablet 0   rosuvastatin (CRESTOR) 40 MG tablet TAKE 1 TABLET (40 MG TOTAL) BY MOUTH DAILY. 90 tablet 1   torsemide (DEMADEX) 20 MG tablet Take 1 tablet (20 mg total) by mouth daily as needed (fluid). 30 tablet 3   traZODone (DESYREL) 50 MG tablet Take 1-2 tablets by mouth at bedtime for sleep as needed 120 tablet 0   VITAMIN D PO Take 1 tablet by mouth daily.     promethazine (PHENERGAN) 12.5 MG tablet Take 1 tablet (12.5 mg total) by mouth every 8 (eight) hours as needed for nausea or vomiting. 30 tablet 1   albuterol (VENTOLIN HFA) 108 (90 Base) MCG/ACT inhaler INHALE 1 - 2 PUFFS INTO THE LUNGS EVERY 4 HOURS AS NEEDED FOR COUGH, WHEEZING, OR CHEST TIGHTNESS (Patient taking differently: Inhale 1-2 puffs into the lungs every 4 (four) hours as needed (For cough, wheezing or chest  tightness).) 18 g 3   No facility-administered medications prior to visit.   Allergies  Allergen Reactions   Elemental Sulfur Diarrhea and Nausea And Vomiting   Carbamazepine Hives   Ceclor [Cefaclor] Other (See  Comments)    Tongue swell   Morphine And Related Itching   Atorvastatin Other (See Comments)    Leg pain   Review of Systems  HENT:  Positive for tinnitus (right ear). Negative for ear pain and hearing loss.       Objective:    Physical Exam Constitutional:      General: She is not in acute distress.    Appearance: Normal appearance. She is not ill-appearing.  HENT:     Head: Normocephalic and atraumatic.     Right Ear: Tympanic membrane, ear canal and external ear normal.     Left Ear: Tympanic membrane, ear canal and external ear normal.     Mouth/Throat:     Mouth: Mucous membranes are moist.     Pharynx: Oropharynx is clear.  Eyes:     Extraocular Movements: Extraocular movements intact.     Pupils: Pupils are equal, round, and reactive to light.  Cardiovascular:     Rate and Rhythm: Normal rate and regular rhythm.     Pulses: Normal pulses.     Heart sounds: Normal heart sounds. No murmur heard.    No gallop.  Pulmonary:     Effort: Pulmonary effort is normal. No respiratory distress.     Breath sounds: Normal breath sounds. No wheezing or rales.  Abdominal:     General: Bowel sounds are normal.  Skin:    General: Skin is warm and dry.  Neurological:     Mental Status: She is alert and oriented to person, place, and time.  Psychiatric:        Mood and Affect: Mood normal.        Behavior: Behavior normal.        Judgment: Judgment normal.    BP 128/78 (BP Location: Right Arm, Patient Position: Sitting, Cuff Size: Normal)   Pulse 73   Temp 98 F (36.7 C) (Oral)   Resp 16   Ht '5\' 5"'$  (1.651 m)   Wt 211 lb 3.2 oz (95.8 kg)   LMP 10/03/2012   SpO2 98%   BMI 35.15 kg/m  Wt Readings from Last 3 Encounters:  05/11/22 211 lb 3.2 oz (95.8 kg)   04/27/22 208 lb (94.3 kg)  01/07/22 194 lb (88 kg)   Diabetic Foot Exam - Simple   No data filed    Lab Results  Component Value Date   WBC 6.2 01/07/2022   HGB 14.0 01/07/2022   HCT 42.4 01/07/2022   PLT 191.0 01/07/2022   GLUCOSE 81 01/07/2022   CHOL 159 01/07/2022   TRIG 191.0 (H) 01/07/2022   HDL 55.00 01/07/2022   LDLDIRECT 79.0 04/16/2019   LDLCALC 66 01/07/2022   ALT 17 01/07/2022   AST 16 01/07/2022   NA 140 01/07/2022   K 4.4 01/07/2022   CL 104 01/07/2022   CREATININE 0.80 01/07/2022   BUN 14 01/07/2022   CO2 28 01/07/2022   TSH 1.06 01/07/2022   HGBA1C 5.4 01/07/2022   Lab Results  Component Value Date   TSH 1.06 01/07/2022   Lab Results  Component Value Date   WBC 6.2 01/07/2022   HGB 14.0 01/07/2022   HCT 42.4 01/07/2022   MCV 90.0 01/07/2022   PLT 191.0 01/07/2022   Lab Results  Component Value Date   NA 140 01/07/2022   K 4.4 01/07/2022   CO2 28 01/07/2022   GLUCOSE 81 01/07/2022   BUN 14 01/07/2022   CREATININE 0.80 01/07/2022   BILITOT 1.0 01/07/2022  ALKPHOS 49 01/07/2022   AST 16 01/07/2022   ALT 17 01/07/2022   PROT 6.6 01/07/2022   ALBUMIN 4.5 01/07/2022   CALCIUM 9.3 01/07/2022   ANIONGAP 7 05/23/2021   GFR 78.09 01/07/2022   Lab Results  Component Value Date   CHOL 159 01/07/2022   Lab Results  Component Value Date   HDL 55.00 01/07/2022   Lab Results  Component Value Date   LDLCALC 66 01/07/2022   Lab Results  Component Value Date   TRIG 191.0 (H) 01/07/2022   Lab Results  Component Value Date   CHOLHDL 3 01/07/2022   Lab Results  Component Value Date   HGBA1C 5.4 01/07/2022      Assessment & Plan:   Problem List Items Addressed This Visit     Low back pain    Encouraged moist heat and gentle stretching as tolerated. May try NSAIDs and prescription meds as directed and report if symptoms worsen or seek immediate care      Relevant Orders   CBC   Hyperlipidemia    Tolerating statin, encouraged  heart healthy diet, avoid trans fats, minimize simple carbs and saturated fats. Increase exercise as tolerated      Relevant Orders   Lipid panel   TSH   Edema of extremities - Primary   Myalgia    Hydrate and monitor      Relevant Orders   CBC   Magnesium   Hyperglycemia    hgba1c acceptable, minimize simple carbs. Increase exercise as tolerated.  Tetanus due covid booster and flu shot recommended      Relevant Orders   Comprehensive metabolic panel   TSH   Hemoglobin A1c   Tinnitus, right    Has developed only in right ear. Not overwhelming or keeping her up. No recent illness, fall or head trauma. No pain, discharge, hearing change or other neurologic concern. Advised to start a Multivitamin wit minerals and fatty acid supplement. Report if worsens.       Light-headed feeling    Tends to occur when she stands up quickly advised to hydrate well, eat protein every 4-5 hours and to arise more slowly. Report if does not resolve or worsens.       No orders of the defined types were placed in this encounter.  I, Penni Homans, MD, personally preformed the services described in this documentation.  All medical record entries made by the scribe were at my direction and in my presence.  I have reviewed the chart and discharge instructions (if applicable) and agree that the record reflects my personal performance and is accurate and complete. 05/11/2022  I,Mohammed Iqbal,acting as a scribe for Penni Homans, MD.,have documented all relevant documentation on the behalf of Penni Homans, MD,as directed by  Penni Homans, MD while in the presence of Penni Homans, MD.  Penni Homans, MD

## 2022-05-11 NOTE — Assessment & Plan Note (Signed)
Tends to occur when she stands up quickly advised to hydrate well, eat protein every 4-5 hours and to arise more slowly. Report if does not resolve or worsens.

## 2022-05-11 NOTE — Assessment & Plan Note (Signed)
Hydrate and monitor 

## 2022-05-11 NOTE — Assessment & Plan Note (Signed)
Encouraged moist heat and gentle stretching as tolerated. May try NSAIDs and prescription meds as directed and report if symptoms worsen or seek immediate care 

## 2022-05-11 NOTE — Assessment & Plan Note (Signed)
Tolerating statin, encouraged heart healthy diet, avoid trans fats, minimize simple carbs and saturated fats. Increase exercise as tolerated 

## 2022-05-11 NOTE — Assessment & Plan Note (Signed)
Has developed only in right ear. Not overwhelming or keeping her up. No recent illness, fall or head trauma. No pain, discharge, hearing change or other neurologic concern. Advised to start a Multivitamin wit minerals and fatty acid supplement. Report if worsens.

## 2022-05-19 ENCOUNTER — Ambulatory Visit (INDEPENDENT_AMBULATORY_CARE_PROVIDER_SITE_OTHER): Payer: Medicare Other | Admitting: Medical

## 2022-05-19 VITALS — BP 136/80 | HR 76 | Temp 98.4°F | Resp 20 | Ht 65.0 in | Wt 215.0 lb

## 2022-05-19 DIAGNOSIS — J40 Bronchitis, not specified as acute or chronic: Secondary | ICD-10-CM | POA: Diagnosis not present

## 2022-05-19 MED ORDER — BENZONATATE 100 MG PO CAPS: 100.0000 mg | ORAL_CAPSULE | Freq: Three times a day (TID) | ORAL | 0 refills | Status: DC | PRN

## 2022-05-19 MED ORDER — AZITHROMYCIN 250 MG PO TABS: ORAL_TABLET | ORAL | 0 refills | Status: AC

## 2022-05-19 NOTE — Patient Instructions (Addendum)
You appear to have bronchitis. Rest hydrate and tylenol for fever. I am prescribing cough medicine benzonatate, and azithromycin antibiotic. For your nasal congestion use flonase  You should gradually get better. If not then notify us and would recommend a chest xray.  If any any wheezing then use your albuterol.  Follow up in 7-10 days or as needed

## 2022-05-19 NOTE — Progress Notes (Signed)
Subjective:    Patient ID: [name], female    DOB: [birth year], 66 y.o.   MRN: [id number]  HPI  Pt in with chest congestion, productive cough, nasal congestion and mild ha since the weekend. Started on Saturday. First day felt tired and then symptoms started.   Pt states got tdap 3 days before the above. Also got flu shot on the same day.  Nonmoker. Used to but stopped in 2017.    Review of Systems  Constitutional:  Negative for chills, fatigue and fever.  HENT:  Negative for congestion, drooling and ear pain.   Respiratory:  Positive for cough. Negative for chest tightness, shortness of breath and wheezing.        Pt not sure if wheezing is from mucus or not. Only transient  Cardiovascular:  Negative for chest pain and palpitations.  Gastrointestinal:  Negative for abdominal pain.  Genitourinary:  Negative for dysuria.  Musculoskeletal:  Negative for back pain.  Neurological:  Negative for dizziness, speech difficulty and light-headedness.  Hematological:  Negative for adenopathy. Does not bruise/bleed easily.  Psychiatric/Behavioral:  Negative for behavioral problems and confusion.     Past Medical History:  Diagnosis Date   Acute sinusitis 03/27/2013   Allergy    seasonal   Anxiety    Arthritis 06/09/2013   Depression    Dyspareunia    Edema extremities    Encounter for preventative adult health care exam with abnormal findings 04/21/2014   GERD (gastroesophageal reflux disease)    Hip pain, left 11/01/2011   History of proteinuria syndrome    Hyperlipidemia    Insomnia 03/11/2013   Low back pain 05/09/2011   Lymphadenitis 06/16/2012   Myalgia and myositis 02/15/2015   Obese    Onychomycosis 05/09/2011   Oral lesion 08/26/2013   Otitis externa 06/23/2012   left   Perimenopause 03/11/2013   RLS (restless legs syndrome)    RUQ pain 09/20/2011   Sleep apnea 04/21/2014   Tinea corporis 04/26/2017   Unspecified constipation 05/13/2013   Urinary  frequency 08/26/2013     Social History   Socioeconomic History   Marital status: Divorced    Spouse name: Not on file   Number of children: Not on file   Years of education: Not on file   Highest education level: Not on file  Occupational History   Not on file  Tobacco Use   Smoking status: Former    Packs/day: 0.50    Years: 20.00    Total pack years: 10.00    Types: Cigarettes    Quit date: 07/12/1992    Years since quitting: 29.8   Smokeless tobacco: Never  Substance and Sexual Activity   Alcohol use: Yes    Comment: occasionaly   Drug use: No   Sexual activity: Yes    Partners: Male  Other Topics Concern   Not on file  Social History Narrative   Not on file   Social Determinants of Health   Financial Resource Strain: Not on file  Food Insecurity: Not on file  Transportation Needs: Not on file  Physical Activity: Not on file  Stress: Not on file  Social Connections: Not on file  Intimate Partner Violence: Not on file    Past Surgical History:  Procedure Laterality Date   BACK SURGERY  2006   low, discectomy for herniated disc   BLADDER SUSPENSION N/A 11/07/2012   Procedure: [facility] SLING;  Surgeon: Malka So, MD;  Location: [facility];  Service: Urology;  Laterality: N/A;   CARPAL TUNNEL RELEASE Right 2006   CESAREAN SECTION  1993   CYSTOSCOPY N/A 11/07/2012   Procedure: CYSTOSCOPY;  Surgeon: Malka So, MD;  Location: [facility];  Service: Urology;  Laterality: N/A;   TUBAL LIGATION  1993    Family History  Problem Relation Age of Onset   Cancer Mother 39       thyroid, malignant brain tumor   Arthritis Mother        neck, back   Arthritis Father        back pain   Heart disease Father        stent   Hepatitis Sister        Hepatitis C   Heart disease Sister        stent   Breast cancer Maternal Aunt    Colon polyps Maternal Grandmother    Diabetes Maternal Grandmother    Colon cancer Neg Hx    Rectal  cancer Neg Hx    Stomach cancer Neg Hx     Allergies  Allergen Reactions   Elemental Sulfur Diarrhea and Nausea And Vomiting   Carbamazepine Hives   Ceclor [Cefaclor] Other (See Comments)    Tongue swell   Morphine And Related Itching   Atorvastatin Other (See Comments)    Leg pain    Current Outpatient Medications on File Prior to Visit  Medication Sig Dispense Refill   acetaminophen (TYLENOL) 500 MG tablet Take 1,000 mg by mouth every 6 (six) hours as needed for headache or mild pain.     Ascorbic Acid (VITAMIN C PO) Take 1 tablet by mouth daily.     Bacillus Coagulans-Inulin (PROBIOTIC) 1-250 BILLION-MG CAPS Take by mouth.     baclofen (LIORESAL) 20 MG tablet TAKE 1 TABLET (20 MG) BY MOUTH AT BEDTIME AS NEEDED FOR MUSCLE SPASMS. 90 tablet 1   famotidine (PEPCID) 40 MG tablet Take 1 tablet (40 mg total) by mouth at bedtime. 90 tablet 3   fluticasone (FLONASE) 50 MCG/ACT nasal spray Place 2 sprays into both nostrils daily as needed for allergies. 16 g 1   hydrOXYzine (VISTARIL) 25 MG capsule Take 25 mg by mouth daily.     lamoTRIgine (LAMICTAL) 100 MG tablet Take 100 mg by mouth daily.     LORazepam (ATIVAN) 0.5 MG tablet Take1 tablet by mouth daily as needed for severe anxiety (this is a 44-monthsupply) 20 tablet 1   potassium chloride SA (KLOR-CON M) 20 MEQ tablet TAKE 1 TABLET (20 MEQ TOTAL) BY MOUTH DAILY. TAKE A 2ND TABLET DAILY AS NEEDED 100 tablet 1   promethazine (PHENERGAN) 12.5 MG tablet Take 1 tablet (12.5 mg total) by mouth every 8 (eight) hours as needed for nausea or vomiting. 30 tablet 1   QUEtiapine (SEROQUEL) 200 MG tablet Take 1 tablet by mouth at bedtime 60 tablet 0   rOPINIRole (REQUIP) 1 MG tablet Take 1 tablet by mouth at bedtime 60 tablet 0   rosuvastatin (CRESTOR) 40 MG tablet TAKE 1 TABLET (40 MG TOTAL) BY MOUTH DAILY. 90 tablet 1   Tdap (BOOSTRIX) 5-2.5-18.5 LF-MCG/0.5 injection Inject into the muscle. 0.5 mL 0   torsemide (DEMADEX) 20 MG tablet Take 1  tablet (20 mg total) by mouth daily as needed (fluid). 30 tablet 3   traZODone (DESYREL) 50 MG tablet Take 1-2 tablets by mouth at bedtime for sleep as needed 120 tablet 0   VITAMIN D PO Take 1  tablet by mouth daily.     albuterol (VENTOLIN HFA) 108 (90 Base) MCG/ACT inhaler INHALE 1 - 2 PUFFS INTO THE LUNGS EVERY 4 HOURS AS NEEDED FOR COUGH, WHEEZING, OR CHEST TIGHTNESS (Patient taking differently: Inhale 1-2 puffs into the lungs every 4 (four) hours as needed (For cough, wheezing or chest tightness).) 18 g 3   omeprazole (PRILOSEC) 40 MG capsule TAKE 1 CAPSULE (40 MG TOTAL) BY MOUTH DAILY. 90 capsule 1   [DISCONTINUED] pramipexole (MIRAPEX) 0.25 MG tablet Take 1-2 tablets (0.25-0.5 mg total) by mouth at bedtime. 60 tablet 2   No current facility-administered medications on file prior to visit.    BP 136/80   Pulse 76   Temp 98.4 F (36.9 C)   Resp 20   Ht '5\' 5"'$  (1.651 m)   Wt 215 lb (97.5 kg)   LMP 10/03/2012   SpO2 98%   BMI 35.78 kg/m        Objective:   Physical Exam  General Mental Status- Alert. General Appearance- Not in acute distress.   Skin General: Color- Normal Color. Moisture- Normal Moisture.  Neck No jvd.  Chest and Lung Exam Auscultation: Breath Sounds:-Normal.  Cardiovascular Auscultation:Rythm- Regular. Murmurs & Other Heart Sounds:Auscultation of the heart reveals- No Murmurs.  Abdomen Inspection:-Inspeection Normal. Palpation/Percussion:Note:No mass. Palpation and Percussion of the abdomen reveal- Non Tender, Non Distended + BS, no rebound or guarding.   Neurologic Cranial Nerve exam:- CN III-XII intact(No nystagmus), symmetric smile. Strength:- 5/5 equal and symmetric strength both upper and lower extremities.       Assessment & Plan:     Patient Instructions  You appear to have bronchitis. Rest hydrate and tylenol for fever. I am prescribing cough medicine benzonatate, and azithromycin antibiotic. For your nasal congestion use  flonase  You should gradually get better. If not then notify us and would recommend a chest xray.  If any any wheezing then use your albuterol.  Follow up in 7-10 days or as needed    [employer], PA-C  [employer], PA-C

## 2022-05-21 ENCOUNTER — Telehealth: Payer: Self-pay | Admitting: Family Medicine

## 2022-05-21 ENCOUNTER — Other Ambulatory Visit: Payer: Self-pay | Admitting: Family Medicine

## 2022-05-21 MED ORDER — GUAIFENESIN-CODEINE 200-10 MG/5ML PO LIQD: 2.5000 mL | Freq: Three times a day (TID) | ORAL | 0 refills | Status: DC | PRN

## 2022-05-21 NOTE — Telephone Encounter (Signed)
Called pt was advised  

## 2022-05-21 NOTE — Telephone Encounter (Signed)
Pt states she saw Percell Miller for bronchitis and he had given her some medications to help. She states the tessalon is not helping her cough and she was wondering if something stronger could be called in, like maybe codeine syrup so she can sleep. Please advise.   [id number] - [place], [place] [address] [place], [place] [address] Phone: [phone]  Fax: [phone]

## 2022-05-26 ENCOUNTER — Encounter: Payer: Medicare Other | Admitting: Gastroenterology

## 2022-05-26 ENCOUNTER — Telehealth: Payer: Self-pay

## 2022-05-26 NOTE — Addendum Note (Signed)
Addended by: Anabel Halon on: 05/26/2022 10:39 AM   Modules accepted: Orders

## 2022-05-26 NOTE — Telephone Encounter (Signed)
The pt has called the office and stated that she just finished her antibiotic Monday and she is only feeling slightly better. Pt is requesting the chest XR that Percell Miller mentioned at the last OV. I informed her that I would send a message to the provider for her.

## 2022-05-26 NOTE — Telephone Encounter (Signed)
Pt notified of xray ordered

## 2022-05-27 ENCOUNTER — Ambulatory Visit (HOSPITAL_BASED_OUTPATIENT_CLINIC_OR_DEPARTMENT_OTHER)
Admission: RE | Admit: 2022-05-27 | Discharge: 2022-05-27 | Disposition: A | Discharge status: Home / Self Care | Payer: Medicare Other | Source: Ambulatory Visit | Attending: Medical | Admitting: Medical

## 2022-05-27 DIAGNOSIS — J40 Bronchitis, not specified as acute or chronic: Secondary | ICD-10-CM | POA: Insufficient documentation

## 2022-05-27 DIAGNOSIS — R059 Cough, unspecified: Secondary | ICD-10-CM | POA: Diagnosis not present

## 2022-05-27 DIAGNOSIS — R0789 Other chest pain: Secondary | ICD-10-CM | POA: Diagnosis not present

## 2022-05-31 ENCOUNTER — Encounter: Payer: Medicare Other | Admitting: Gastroenterology

## 2022-06-02 DIAGNOSIS — F4323 Adjustment disorder with mixed anxiety and depressed mood: Secondary | ICD-10-CM | POA: Diagnosis not present

## 2022-06-02 DIAGNOSIS — F5102 Adjustment insomnia: Secondary | ICD-10-CM | POA: Diagnosis not present

## 2022-06-02 DIAGNOSIS — F23 Brief psychotic disorder: Secondary | ICD-10-CM | POA: Diagnosis not present

## 2022-06-09 ENCOUNTER — Other Ambulatory Visit: Payer: Self-pay | Admitting: Family Medicine

## 2022-06-28 ENCOUNTER — Telehealth: Payer: Self-pay | Admitting: Family Medicine

## 2022-06-28 NOTE — Telephone Encounter (Signed)
Patient called to advise that she had bronchitis in November and she doesn't think she is totally over it. She said is now coughing up yellow stuff and it's also coming out of her nose. Patient wants to know if something can be called in for her. She prefers [facility] [address] in [place]

## 2022-06-29 ENCOUNTER — Other Ambulatory Visit: Payer: Self-pay

## 2022-06-29 MED ORDER — AMOXICILLIN 500 MG PO CAPS: 500.0000 mg | ORAL_CAPSULE | Freq: Three times a day (TID) | ORAL | 0 refills | Status: AC

## 2022-06-29 NOTE — Telephone Encounter (Signed)
Called pt was advised and stated understand

## 2022-06-29 NOTE — Telephone Encounter (Signed)
Patient called to get an update on her message. Please advise.

## 2022-06-30 DIAGNOSIS — F23 Brief psychotic disorder: Secondary | ICD-10-CM | POA: Diagnosis not present

## 2022-06-30 DIAGNOSIS — F5102 Adjustment insomnia: Secondary | ICD-10-CM | POA: Diagnosis not present

## 2022-06-30 DIAGNOSIS — F4323 Adjustment disorder with mixed anxiety and depressed mood: Secondary | ICD-10-CM | POA: Diagnosis not present

## 2022-07-06 ENCOUNTER — Other Ambulatory Visit: Payer: Self-pay | Admitting: Family Medicine

## 2022-07-06 DIAGNOSIS — R252 Cramp and spasm: Secondary | ICD-10-CM

## 2022-07-21 ENCOUNTER — Ambulatory Visit: Payer: Medicare Other | Admitting: Family

## 2022-07-22 ENCOUNTER — Encounter: Payer: Self-pay | Admitting: Family Medicine

## 2022-07-22 ENCOUNTER — Ambulatory Visit (INDEPENDENT_AMBULATORY_CARE_PROVIDER_SITE_OTHER): Payer: Medicare Other | Admitting: Family Medicine

## 2022-07-22 VITALS — BP 105/62 | HR 66 | Temp 97.6°F | Resp 16 | Ht 65.0 in | Wt 208.0 lb

## 2022-07-22 DIAGNOSIS — R052 Subacute cough: Secondary | ICD-10-CM

## 2022-07-22 DIAGNOSIS — R5383 Other fatigue: Secondary | ICD-10-CM | POA: Diagnosis not present

## 2022-07-22 LAB — CBC
HCT: 44.1 % (ref 36.0–46.0)
Hemoglobin: 14.7 g/dL (ref 12.0–15.0)
MCHC: 33.4 g/dL (ref 30.0–36.0)
MCV: 89.4 fl (ref 78.0–100.0)
Platelets: 233 10*3/uL (ref 150.0–400.0)
RBC: 4.93 Mil/uL (ref 3.87–5.11)
RDW: 12.9 % (ref 11.5–15.5)
WBC: 5.9 10*3/uL (ref 4.0–10.5)

## 2022-07-22 LAB — BASIC METABOLIC PANEL
BUN: 10 mg/dL (ref 6–23)
CO2: 28 mEq/L (ref 19–32)
Calcium: 9.2 mg/dL (ref 8.4–10.5)
Chloride: 106 mEq/L (ref 96–112)
Creatinine, Ser: 0.8 mg/dL (ref 0.40–1.20)
GFR: 77.8 mL/min (ref >60.00)
Glucose, Bld: 90 mg/dL (ref 70–99)
Potassium: 4.3 mEq/L (ref 3.5–5.1)
Sodium: 140 mEq/L (ref 135–145)

## 2022-07-22 MED ORDER — PREDNISONE 20 MG PO TABS: 40.0000 mg | ORAL_TABLET | Freq: Every day | ORAL | 0 refills | Status: AC

## 2022-07-22 MED ORDER — DOXYCYCLINE HYCLATE 100 MG PO TABS: 100.0000 mg | ORAL_TABLET | Freq: Two times a day (BID) | ORAL | 0 refills | Status: AC

## 2022-07-22 MED ORDER — BENZONATATE 100 MG PO CAPS: 100.0000 mg | ORAL_CAPSULE | Freq: Three times a day (TID) | ORAL | 0 refills | Status: DC | PRN

## 2022-07-22 MED ORDER — GUAIFENESIN-CODEINE 200-10 MG/5ML PO LIQD: 2.5000 mL | Freq: Three times a day (TID) | ORAL | 0 refills | Status: AC | PRN

## 2022-07-22 MED ORDER — ALBUTEROL SULFATE HFA 108 (90 BASE) MCG/ACT IN AERS: INHALATION_SPRAY | RESPIRATORY_TRACT | 3 refills | Status: DC

## 2022-07-22 NOTE — Progress Notes (Signed)
Acute Office Visit  Subjective:     Patient ID: [name], female    DOB: April 09, 1956, 67 y.o.   MRN: [id number]  Chief Complaint  Patient presents with   Cough   Sinus Problem     Patient is in today for cough.  Patient reports symptoms initially started in October with sinus infection and cough. She tested negative for COVID. She saw Mackie Pai, PA on 05/19/22 and started Zpak and benzonatate. CXR on 05/27/22 was normal. On 06/29/22 she was communicating with PCP via MyChart and reported ongoing symptoms - she was started on amoxicillin and mucinex. Since then, sinus symptoms have improved, but she continues with ongoing productive cough. She has trouble getting the mucus up sometimes, but when she does it is thick, yellow/white and sometimes get stuck in her throat. She has also been feeling more fatigued lately making some ADLs more difficult. She has had some wheezing and dyspnea. She denies fevers, chills, unexplained weight loss, chest pain, hemoptysis, ear pain.  Former smoker: 18+ years, about a pack a day, quit in 2015    ROS:  A comprehensive ROS was completed and negative except as noted per HPI        Objective:    BP 105/62   Pulse 66   Temp 97.6 F (36.4 C)   Resp 16   Ht '5\' 5"'$  (1.651 m)   Wt 208 lb (94.3 kg)   LMP 10/03/2012   SpO2 99%   BMI 34.61 kg/m    Physical Exam Vitals reviewed.  Constitutional:      Appearance: Normal appearance.  Cardiovascular:     Rate and Rhythm: Normal rate and regular rhythm.     Pulses: Normal pulses.     Heart sounds: Normal heart sounds.  Pulmonary:     Effort: Pulmonary effort is normal.     Breath sounds: Normal breath sounds.  Skin:    General: Skin is warm and dry.  Neurological:     Mental Status: She is alert and oriented to person, place, and time.  Psychiatric:        Mood and Affect: Mood normal.        Behavior: Behavior normal.        Thought Content: Thought content normal.         Judgment: Judgment normal.     No results found for any visits on 07/22/22.      Assessment & Plan:   Problem List Items Addressed This Visit       Other   Other fatigue   Relevant Orders   CBC   Basic Metabolic Panel (BMET)   Other Visit Diagnoses     Subacute cough    -  Primary   Relevant Medications   albuterol (VENTOLIN HFA) 108 (90 Base) MCG/ACT inhaler   benzonatate (TESSALON) 100 MG capsule   guaiFENesin-Codeine 200-10 MG/5ML LIQD   predniSONE (DELTASONE) 20 MG tablet   doxycycline (VIBRA-TABS) 100 MG tablet   Other Relevant Orders   CT CHEST WO CONTRAST   CBC   Basic Metabolic Panel (BMET)       Given duration/severity of your symptoms and smoking history, let's proceed with a CT scan since the xray was inconclusive.  Adding some prednisone and doxycycline. Refilling your cough medicine and inhaler.  If workup inconclusive and symptoms persist after steroids and ABX, refer to pulmonology for further evaluation.  Continue supportive measures including rest, hydration, humidifier use, steam showers, warm compresses  to sinuses, warm liquids with lemon and honey, and over-the-counter cough, cold, and analgesics as needed.   Patient aware of signs/symptoms requiring further/urgent evaluation.     Meds ordered this encounter  Medications   albuterol (VENTOLIN HFA) 108 (90 Base) MCG/ACT inhaler    Sig: INHALE 1 - 2 PUFFS INTO THE LUNGS EVERY 4 HOURS AS NEEDED FOR COUGH, WHEEZING, OR CHEST TIGHTNESS    Dispense:  18 g    Refill:  3    Order Specific Question:   Supervising Provider    Answer:   Penni Homans A [[id number]]   benzonatate (TESSALON) 100 MG capsule    Sig: Take 1 capsule (100 mg total) by mouth 3 (three) times daily as needed for cough.    Dispense:  30 capsule    Refill:  0    Order Specific Question:   Supervising Provider    Answer:   Penni Homans A [[id number]]   guaiFENesin-Codeine 200-10 MG/5ML LIQD    Sig: Take 2.5-5 mLs by mouth 3 (three) times  daily as needed for up to 5 days (cough).    Dispense:  75 mL    Refill:  0    Order Specific Question:   Supervising Provider    Answer:   Penni Homans A [[id number]]   predniSONE (DELTASONE) 20 MG tablet    Sig: Take 2 tablets (40 mg total) by mouth daily with breakfast for 5 days.    Dispense:  10 tablet    Refill:  0    Order Specific Question:   Supervising Provider    Answer:   Penni Homans A [[id number]]   doxycycline (VIBRA-TABS) 100 MG tablet    Sig: Take 1 tablet (100 mg total) by mouth 2 (two) times daily for 10 days.    Dispense:  20 tablet    Refill:  0    Order Specific Question:   Supervising Provider    Answer:   Penni Homans A [[id number]]    Return if symptoms worsen or fail to improve.  Terrilyn Saver, NP

## 2022-07-22 NOTE — Patient Instructions (Signed)
Given duration of your symptoms and smoking history, let's proceed with a CT scan since the xray was inconclusive.  Adding some prednisone and doxycycline. Refilling your cough medicine and inhaler.  If workup inconclusive and symptoms persist after steroids and ABX, refer to pulmonology for further evaluation.  Continue supportive measures including rest, hydration, humidifier use, steam showers, warm compresses to sinuses, warm liquids with lemon and honey, and over-the-counter cough, cold, and analgesics as needed.   Please contact office for follow-up if symptoms do not improve or worsen. Seek emergency care if symptoms become severe.

## 2022-07-23 ENCOUNTER — Telehealth: Payer: Self-pay | Admitting: Family Medicine

## 2022-07-23 ENCOUNTER — Encounter: Payer: Self-pay | Admitting: Family Medicine

## 2022-07-23 NOTE — Telephone Encounter (Signed)
Melissa from the [facility] stated pt is not in network with [facility]. Advised it looks like it was sent to [facility] but that does not show in their system. They would like to know what the provider would like to do. She can be reached at [phone], she stated ask to speak to a nurse and provide pt's name and DOB.

## 2022-07-26 NOTE — Progress Notes (Signed)
Letter mailed.

## 2022-07-26 NOTE — Telephone Encounter (Signed)
Called pt ask her where will they like Imaging referral to  Go so the insurance will cover and pt stated will found out and let us know

## 2022-08-03 ENCOUNTER — Ambulatory Visit (HOSPITAL_BASED_OUTPATIENT_CLINIC_OR_DEPARTMENT_OTHER): Payer: Medicare Other

## 2022-08-03 NOTE — Telephone Encounter (Signed)
Patient stated Imaging from downstairs denied the PA for her chest scan. She would like to know what she can do. Please advise.

## 2022-08-03 NOTE — Telephone Encounter (Signed)
Called pt was advised she need to call her insurance  To found out where to send referral for to be cover

## 2022-08-09 DIAGNOSIS — K080 Exfoliation of teeth due to systemic causes: Secondary | ICD-10-CM | POA: Diagnosis not present

## 2022-08-25 DIAGNOSIS — F4323 Adjustment disorder with mixed anxiety and depressed mood: Secondary | ICD-10-CM | POA: Diagnosis not present

## 2022-08-25 DIAGNOSIS — F5102 Adjustment insomnia: Secondary | ICD-10-CM | POA: Diagnosis not present

## 2022-08-25 DIAGNOSIS — F23 Brief psychotic disorder: Secondary | ICD-10-CM | POA: Diagnosis not present

## 2022-09-07 ENCOUNTER — Ambulatory Visit (INDEPENDENT_AMBULATORY_CARE_PROVIDER_SITE_OTHER): Payer: Medicare Other | Admitting: Family Medicine

## 2022-09-07 VITALS — BP 105/68 | HR 63 | Temp 97.5°F | Resp 16 | Ht 65.0 in | Wt 208.4 lb

## 2022-09-07 DIAGNOSIS — R0789 Other chest pain: Secondary | ICD-10-CM

## 2022-09-07 DIAGNOSIS — R739 Hyperglycemia, unspecified: Secondary | ICD-10-CM

## 2022-09-07 DIAGNOSIS — E782 Mixed hyperlipidemia: Secondary | ICD-10-CM

## 2022-09-07 DIAGNOSIS — L578 Other skin changes due to chronic exposure to nonionizing radiation: Secondary | ICD-10-CM | POA: Diagnosis not present

## 2022-09-07 DIAGNOSIS — L989 Disorder of the skin and subcutaneous tissue, unspecified: Secondary | ICD-10-CM | POA: Diagnosis not present

## 2022-09-07 DIAGNOSIS — R252 Cramp and spasm: Secondary | ICD-10-CM

## 2022-09-07 NOTE — Patient Instructions (Signed)
Shingrix is the new shingles shot, 2 shots over 2-6 months, confirm coverage with insurance and document, then can return here for shots with nurse appt or at pharmacy

## 2022-09-07 NOTE — Progress Notes (Signed)
Subjective:   By signing my name below, I, [name], attest that this documentation has been prepared under the direction and in the presence of Kristen Lukes, MD., 09/07/2022.   Patient ID: [name], female    DOB: [birth year], 67 y.o.   MRN: [id number]  No chief complaint on file.  HPI Patient is in today for an office visit. She denies CP/palpitations/SOB/HA/congestion/ fever/chills/GI or GU symptoms.  Hairline Lesion She is requesting a referral to dermatology to examine a lesion on her hairline (left temple).  Subacute Cough Patient was seen by Kristen Jobs, NP, on 07/22/2022 due to subacute cough. She was prescribed Albuterol inhaler, Benzonatate 100 mg, Doxycycline 100 mg, Guaifenesin-Codeine 200-10 mg/5 mL, and Prednisone 20 mg. Additionally, a CT scan of the chest was ordered but not completed due to her insurance not covering this. She returns today and reports that the coughing has subsided, though she now experiences pain along her left midaxillary line and cannot sleep on her left side.   Past Medical History:  Diagnosis Date   Acute sinusitis 03/27/2013   Allergy    seasonal   Anxiety    Arthritis 06/09/2013   Depression    Dyspareunia    Edema extremities    Encounter for preventative adult health care exam with abnormal findings 04/21/2014   GERD (gastroesophageal reflux disease)    Hip pain, left 11/01/2011   History of proteinuria syndrome    Hyperlipidemia    Insomnia 03/11/2013   Low back pain 05/09/2011   Lymphadenitis 06/16/2012   Myalgia and myositis 02/15/2015   Obese    Onychomycosis 05/09/2011   Oral lesion 08/26/2013   Otitis externa 06/23/2012   left   Perimenopause 03/11/2013   RLS (restless legs syndrome)    RUQ pain 09/20/2011   Sleep apnea 04/21/2014   Tinea corporis 04/26/2017   Unspecified constipation 05/13/2013   Urinary frequency 08/26/2013    Past Surgical History:  Procedure Laterality Date   BACK SURGERY  2006    low, discectomy for herniated disc   BLADDER SUSPENSION N/A 11/07/2012   Procedure: [employer] SLING;  Surgeon: Malka So, MD;  Location: [facility];  Service: Urology;  Laterality: N/A;   CARPAL TUNNEL RELEASE Right 2006   CESAREAN SECTION  1993   CYSTOSCOPY N/A 11/07/2012   Procedure: CYSTOSCOPY;  Surgeon: Malka So, MD;  Location: [facility];  Service: Urology;  Laterality: N/A;   TUBAL LIGATION  1993    Family History  Problem Relation Age of Onset   Cancer Mother 65       thyroid, malignant brain tumor   Arthritis Mother        neck, back   Arthritis Father        back pain   Heart disease Father        stent   Hepatitis Sister        Hepatitis C   Heart disease Sister        stent   Breast cancer Maternal Aunt    Colon polyps Maternal Grandmother    Diabetes Maternal Grandmother    Colon cancer Neg Hx    Rectal cancer Neg Hx    Stomach cancer Neg Hx     Social History   Socioeconomic History   Marital status: Divorced    Spouse name: Not on file   Number of children: Not on file   Years of education: Not on file  Highest education level: Not on file  Occupational History   Not on file  Tobacco Use   Smoking status: Former    Packs/day: 0.50    Years: 20.00    Total pack years: 10.00    Types: Cigarettes    Quit date: 07/12/1992    Years since quitting: 30.1   Smokeless tobacco: Never  Substance and Sexual Activity   Alcohol use: Yes    Comment: occasionaly   Drug use: No   Sexual activity: Yes    Partners: Male  Other Topics Concern   Not on file  Social History Narrative   Not on file   Social Determinants of Health   Financial Resource Strain: Not on file  Food Insecurity: Not on file  Transportation Needs: Not on file  Physical Activity: Not on file  Stress: Not on file  Social Connections: Not on file  Intimate Partner Violence: Not on file    Outpatient Medications Prior to Visit  Medication Sig  Dispense Refill   acetaminophen (TYLENOL) 500 MG tablet Take 1,000 mg by mouth every 6 (six) hours as needed for headache or mild pain.     albuterol (VENTOLIN HFA) 108 (90 Base) MCG/ACT inhaler INHALE 1 - 2 PUFFS INTO THE LUNGS EVERY 4 HOURS AS NEEDED FOR COUGH, WHEEZING, OR CHEST TIGHTNESS 18 g 3   Ascorbic Acid (VITAMIN C PO) Take 1 tablet by mouth daily.     Bacillus Coagulans-Inulin (PROBIOTIC) 1-250 BILLION-MG CAPS Take by mouth.     baclofen (LIORESAL) 20 MG tablet TAKE 1 TABLET(20 MG) BY MOUTH AT BEDTIME AS NEEDED FOR MUSCLE SPASMS 90 tablet 1   benzonatate (TESSALON) 100 MG capsule Take 1 capsule (100 mg total) by mouth 3 (three) times daily as needed for cough. 30 capsule 0   famotidine (PEPCID) 40 MG tablet Take 1 tablet (40 mg total) by mouth at bedtime. 90 tablet 3   fluticasone (FLONASE) 50 MCG/ACT nasal spray Place 2 sprays into both nostrils daily as needed for allergies. 16 g 1   hydrOXYzine (VISTARIL) 25 MG capsule Take 25 mg by mouth daily.     lamoTRIgine (LAMICTAL) 100 MG tablet Take 100 mg by mouth daily.     LORazepam (ATIVAN) 0.5 MG tablet Take1 tablet by mouth daily as needed for severe anxiety (this is a 44-monthsupply) 20 tablet 1   omeprazole (PRILOSEC) 40 MG capsule TAKE 1 CAPSULE (40 MG TOTAL) BY MOUTH DAILY. 90 capsule 1   potassium chloride SA (KLOR-CON M) 20 MEQ tablet TAKE 1 TABLET BY MOUTH DAILY. TAKE A 2ND TABLET DAILY AS NEEDED 100 tablet 1   promethazine (PHENERGAN) 12.5 MG tablet Take 1 tablet (12.5 mg total) by mouth every 8 (eight) hours as needed for nausea or vomiting. 30 tablet 1   rOPINIRole (REQUIP) 1 MG tablet Take 1 tablet by mouth at bedtime 60 tablet 0   rosuvastatin (CRESTOR) 40 MG tablet Take 1 tablet (40 mg total) by mouth daily. 90 tablet 1   torsemide (DEMADEX) 20 MG tablet Take 1 tablet (20 mg total) by mouth daily as needed (fluid). 30 tablet 3   traZODone (DESYREL) 50 MG tablet Take 1-2 tablets by mouth at bedtime for sleep as needed 120  tablet 0   VITAMIN D PO Take 1 tablet by mouth daily.     No facility-administered medications prior to visit.    Allergies  Allergen Reactions   Elemental Sulfur Diarrhea and Nausea And Vomiting   Carbamazepine Hives  Ceclor [Cefaclor] Other (See Comments)    Tongue swell   Morphine And Related Itching   Atorvastatin Other (See Comments)    Leg pain    Review of Systems  Constitutional:  Negative for chills and fever.  HENT:  Negative for congestion.   Respiratory:  Negative for shortness of breath.   Cardiovascular:  Negative for chest pain and palpitations.  Gastrointestinal:  Negative for abdominal pain, blood in stool, constipation, diarrhea, nausea and vomiting.  Genitourinary:  Negative for dysuria, frequency, hematuria and urgency.  Musculoskeletal:        (+) left midaxillary line pain, specifically the posterior left midaxillary line below the second rib.  Skin:        (+) lesion on hairline (left temple)   Neurological:  Negative for headaches.       Objective:    Physical Exam Constitutional:      General: She is not in acute distress.    Appearance: Normal appearance. She is normal weight. She is not ill-appearing.  HENT:     Head: Normocephalic and atraumatic.     Right Ear: External ear normal.     Left Ear: External ear normal.     Nose: Nose normal.     Mouth/Throat:     Mouth: Mucous membranes are moist.     Pharynx: Oropharynx is clear.  Eyes:     General:        Right eye: No discharge.        Left eye: No discharge.     Extraocular Movements: Extraocular movements intact.     Conjunctiva/sclera: Conjunctivae normal.     Pupils: Pupils are equal, round, and reactive to light.  Cardiovascular:     Rate and Rhythm: Normal rate and regular rhythm.     Pulses: Normal pulses.     Heart sounds: Normal heart sounds. No murmur heard.    No gallop.  Pulmonary:     Effort: Pulmonary effort is normal. No respiratory distress.     Breath sounds:  Normal breath sounds. No wheezing or rales.  Abdominal:     General: Bowel sounds are normal.     Palpations: Abdomen is soft.     Tenderness: There is no abdominal tenderness. There is no guarding.  Musculoskeletal:        General: Normal range of motion.     Cervical back: Normal range of motion.     Right lower leg: No edema.     Left lower leg: No edema.  Skin:    General: Skin is warm and dry.     Comments: -cherry angiomas across the abdomen. -sun damaged skin. -white, raised lesion at hairline (left temple).  Neurological:     Mental Status: She is alert and oriented to person, place, and time.  Psychiatric:        Mood and Affect: Mood normal.        Behavior: Behavior normal.        Judgment: Judgment normal.     LMP 10/03/2012  Wt Readings from Last 3 Encounters:  07/22/22 208 lb (94.3 kg)  05/19/22 215 lb (97.5 kg)  05/11/22 211 lb 3.2 oz (95.8 kg)    Diabetic Foot Exam - Simple   No data filed    Lab Results  Component Value Date   WBC 5.9 07/22/2022   HGB 14.7 07/22/2022   HCT 44.1 07/22/2022   PLT 233.0 07/22/2022   GLUCOSE 90 07/22/2022   CHOL 163 05/11/2022  TRIG 220.0 (H) 05/11/2022   HDL 52.70 05/11/2022   LDLDIRECT 82.0 05/11/2022   LDLCALC 66 01/07/2022   ALT 17 05/11/2022   AST 15 05/11/2022   NA 140 07/22/2022   K 4.3 07/22/2022   CL 106 07/22/2022   CREATININE 0.80 07/22/2022   BUN 10 07/22/2022   CO2 28 07/22/2022   TSH 1.27 05/11/2022   HGBA1C 5.5 05/11/2022    Lab Results  Component Value Date   TSH 1.27 05/11/2022   Lab Results  Component Value Date   WBC 5.9 07/22/2022   HGB 14.7 07/22/2022   HCT 44.1 07/22/2022   MCV 89.4 07/22/2022   PLT 233.0 07/22/2022   Lab Results  Component Value Date   NA 140 07/22/2022   K 4.3 07/22/2022   CO2 28 07/22/2022   GLUCOSE 90 07/22/2022   BUN 10 07/22/2022   CREATININE 0.80 07/22/2022   BILITOT 0.8 05/11/2022   ALKPHOS 46 05/11/2022   AST 15 05/11/2022   ALT 17  05/11/2022   PROT 6.7 05/11/2022   ALBUMIN 4.4 05/11/2022   CALCIUM 9.2 07/22/2022   ANIONGAP 7 05/23/2021   GFR 77.80 07/22/2022   Lab Results  Component Value Date   CHOL 163 05/11/2022   Lab Results  Component Value Date   HDL 52.70 05/11/2022   Lab Results  Component Value Date   LDLCALC 66 01/07/2022   Lab Results  Component Value Date   TRIG 220.0 (H) 05/11/2022   Lab Results  Component Value Date   CHOLHDL 3 05/11/2022   Lab Results  Component Value Date   HGBA1C 5.5 05/11/2022      Assessment & Plan:  Dermatology: Referral placed to dermatology close to [facility].   Immunizations: Reviewed patient's immunization history and encouraged Shingles immunization. Patient declines all offered vaccinations.  Left Midaxillary Line Pain: Recommended ice and Voltaren gel.  Refills: Promethazine 12.5 mg refilled today. Problem List Items Addressed This Visit   None  No orders of the defined types were placed in this encounter.  I, [name], personally preformed the services described in this documentation.  All medical record entries made by the scribe were at my direction and in my presence.  I have reviewed the chart and discharge instructions (if applicable) and agree that the record reflects my personal performance and is accurate and complete. 09/07/2022  I,Mohammed Iqbal,acting as a scribe for Penni Homans, MD.,have documented all relevant documentation on the behalf of Penni Homans, MD,as directed by  Penni Homans, MD while in the presence of Penni Homans, MD.  [name]

## 2022-09-08 NOTE — Assessment & Plan Note (Signed)
hgba1c acceptable, minimize simple carbs. Increase exercise as tolerated.  

## 2022-09-08 NOTE — Assessment & Plan Note (Signed)
Encourage heart healthy diet such as MIND or DASH diet, increase exercise, avoid trans fats, simple carbohydrates and processed foods, consider a krill or fish or flaxseed oil cap daily.  °

## 2022-09-08 NOTE — Assessment & Plan Note (Addendum)
Cough has mostly resolved but she still notes left mid axillary line pain with pressure and coughing. She is encouraged to apply ice and topical treatments and report if pain worsens or persists. Since cough has resolved will hold off on CT scan

## 2022-09-08 NOTE — Assessment & Plan Note (Signed)
Hydrate and monitor 

## 2022-09-20 ENCOUNTER — Ambulatory Visit (INDEPENDENT_AMBULATORY_CARE_PROVIDER_SITE_OTHER): Payer: Medicare Other | Admitting: *Deleted

## 2022-09-20 DIAGNOSIS — Z0000 Encounter for general adult medical examination without abnormal findings: Secondary | ICD-10-CM

## 2022-09-20 NOTE — Patient Instructions (Signed)
[name] , Thank you for taking time to come for your Medicare Wellness Visit. I appreciate your ongoing commitment to your health goals. Please review the following plan we discussed and let me know if I can assist you in the future.   These are the goals we discussed:  Goals   None     This is a list of the screening recommended for you and due dates:  Health Maintenance  Topic Date Due   Zoster (Shingles) Vaccine (1 of 2) Never done   Colon Cancer Screening  02/07/2019   Pap Smear  09/02/2019   Medicare Annual Wellness Visit  09/20/2023   Mammogram  03/17/2024   DTaP/Tdap/Td vaccine (3 - Td or Tdap) 05/11/2032   Flu Shot  Completed   Hepatitis C Screening: USPSTF Recommendation to screen - Ages 18-79 yo.  Completed   HIV Screening  Completed   HPV Vaccine  Aged Out   COVID-19 Vaccine  Discontinued      Next appointment: Follow up in one year for your annual wellness visit.   Preventive Care 40-64 Years, Female Preventive care refers to lifestyle choices and visits with your health care provider that can promote health and wellness. What does preventive care include? A yearly physical exam. This is also called an annual well check. Dental exams once or twice a year. Routine eye exams. Ask your health care provider how often you should have your eyes checked. Personal lifestyle choices, including: Daily care of your teeth and gums. Regular physical activity. Eating a healthy diet. Avoiding tobacco and drug use. Limiting alcohol use. Practicing safe sex. Taking low-dose aspirin daily starting at age 29. Taking vitamin and mineral supplements as recommended by your health care provider. What happens during an annual well check? The services and screenings done by your health care provider during your annual well check will depend on your age, overall health, lifestyle risk factors, and family history of disease. Counseling  Your health care provider may ask you  questions about your: Alcohol use. Tobacco use. Drug use. Emotional well-being. Home and relationship well-being. Sexual activity. Eating habits. Work and work Statistician. Method of birth control. Menstrual cycle. Pregnancy history. Screening  You may have the following tests or measurements: Height, weight, and BMI. Blood pressure. Lipid and cholesterol levels. These may be checked every 5 years, or more frequently if you are over 72 years old. Skin check. Lung cancer screening. You may have this screening every year starting at age 37 if you have a 30-pack-year history of smoking and currently smoke or have quit within the past 15 years. Fecal occult blood test (FOBT) of the stool. You may have this test every year starting at age 24. Flexible sigmoidoscopy or colonoscopy. You may have a sigmoidoscopy every 5 years or a colonoscopy every 10 years starting at age 15. Hepatitis C blood test. Hepatitis B blood test. Sexually transmitted disease (STD) testing. Diabetes screening. This is done by checking your blood sugar (glucose) after you have not eaten for a while (fasting). You may have this done every 1-3 years. Mammogram. This may be done every 1-2 years. Talk to your health care provider about when you should start having regular mammograms. This may depend on whether you have a family history of breast cancer. BRCA-related cancer screening. This may be done if you have a family history of breast, ovarian, tubal, or peritoneal cancers. Pelvic exam and Pap test. This may be done every 3 years starting at age  21. Starting at age 48, this may be done every 5 years if you have a Pap test in combination with an HPV test. Bone density scan. This is done to screen for osteoporosis. You may have this scan if you are at high risk for osteoporosis. Discuss your test results, treatment options, and if necessary, the need for more tests with your health care provider. Vaccines  Your health  care provider may recommend certain vaccines, such as: Influenza vaccine. This is recommended every year. Tetanus, diphtheria, and acellular pertussis (Tdap, Td) vaccine. You may need a Td booster every 10 years. Zoster vaccine. You may need this after age 42. Pneumococcal 13-valent conjugate (PCV13) vaccine. You may need this if you have certain conditions and were not previously vaccinated. Pneumococcal polysaccharide (PPSV23) vaccine. You may need one or two doses if you smoke cigarettes or if you have certain conditions. Talk to your health care provider about which screenings and vaccines you need and how often you need them. This information is not intended to replace advice given to you by your health care provider. Make sure you discuss any questions you have with your health care provider. Document Released: 07/25/2015 Document Revised: 03/17/2016 Document Reviewed: 04/29/2015 Elsevier Interactive Patient Education  2017 [place] Prevention in the Home Falls can cause injuries. They can happen to people of all ages. There are many things you can do to make your home safe and to help prevent falls. What can I do on the outside of my home? Regularly fix the edges of walkways and driveways and fix any cracks. Remove anything that might make you trip as you walk through a door, such as a raised step or threshold. Trim any bushes or trees on the path to your home. Use bright outdoor lighting. Clear any walking paths of anything that might make someone trip, such as rocks or tools. Regularly check to see if handrails are loose or broken. Make sure that both sides of any steps have handrails. Any raised decks and porches should have guardrails on the edges. Have any leaves, snow, or ice cleared regularly. Use sand or salt on walking paths during winter. Clean up any spills in your garage right away. This includes oil or grease spills. What can I do in the bathroom? Use  night lights. Install grab bars by the toilet and in the tub and shower. Do not use towel bars as grab bars. Use non-skid mats or decals in the tub or shower. If you need to sit down in the shower, use a plastic, non-slip stool. Keep the floor dry. Clean up any water that spills on the floor as soon as it happens. Remove soap buildup in the tub or shower regularly. Attach bath mats securely with double-sided non-slip rug tape. Do not have throw rugs and other things on the floor that can make you trip. What can I do in the bedroom? Use night lights. Make sure that you have a light by your bed that is easy to reach. Do not use any sheets or blankets that are too big for your bed. They should not hang down onto the floor. Have a firm chair that has side arms. You can use this for support while you get dressed. Do not have throw rugs and other things on the floor that can make you trip. What can I do in the kitchen? Clean up any spills right away. Avoid walking on wet floors. Keep items that you  use a lot in easy-to-reach places. If you need to reach something above you, use a strong step stool that has a grab bar. Keep electrical cords out of the way. Do not use floor polish or wax that makes floors slippery. If you must use wax, use non-skid floor wax. Do not have throw rugs and other things on the floor that can make you trip. What can I do with my stairs? Do not leave any items on the stairs. Make sure that there are handrails on both sides of the stairs and use them. Fix handrails that are broken or loose. Make sure that handrails are as long as the stairways. Check any carpeting to make sure that it is firmly attached to the stairs. Fix any carpet that is loose or worn. Avoid having throw rugs at the top or bottom of the stairs. If you do have throw rugs, attach them to the floor with carpet tape. Make sure that you have a light switch at the top of the stairs and the bottom of the  stairs. If you do not have them, ask someone to add them for you. What else can I do to help prevent falls? Wear shoes that: Do not have high heels. Have rubber bottoms. Are comfortable and fit you well. Are closed at the toe. Do not wear sandals. If you use a stepladder: Make sure that it is fully opened. Do not climb a closed stepladder. Make sure that both sides of the stepladder are locked into place. Ask someone to hold it for you, if possible. Clearly mark and make sure that you can see: Any grab bars or handrails. First and last steps. Where the edge of each step is. Use tools that help you move around (mobility aids) if they are needed. These include: Canes. Walkers. Scooters. Crutches. Turn on the lights when you go into a dark area. Replace any light bulbs as soon as they burn out. Set up your furniture so you have a clear path. Avoid moving your furniture around. If any of your floors are uneven, fix them. If there are any pets around you, be aware of where they are. Review your medicines with your doctor. Some medicines can make you feel dizzy. This can increase your chance of falling. Ask your doctor what other things that you can do to help prevent falls. This information is not intended to replace advice given to you by your health care provider. Make sure you discuss any questions you have with your health care provider. Document Released: 04/24/2009 Document Revised: 12/04/2015 Document Reviewed: 08/02/2014 Elsevier Interactive Patient Education  2017 [employer].

## 2022-09-20 NOTE — Progress Notes (Signed)
Subjective:   [name] is a 67 y.o. female who presents for Medicare Annual (Subsequent) preventive examination.  I connected with  [name] on 09/20/22 by a audio enabled telemedicine application and verified that I am speaking with the correct person using two identifiers.  Patient Location: Home  Provider Location: Office/Clinic  I discussed the limitations of evaluation and management by telemedicine. The patient expressed understanding and agreed to proceed.   Review of Systems     Cardiac Risk Factors include: advanced age (>15mn, >>41women);dyslipidemia;obesity (BMI >30kg/m2)     Objective:    There were no vitals filed for this visit. There is no height or weight on file to calculate BMI.     09/20/2022    9:44 AM 01/22/2015    2:25 PM 02/06/2014   11:18 AM 11/07/2012    6:56 AM  Advanced Directives  Does Patient Have a Medical Advance Directive? No No Patient would like information Patient does not have advance directive  Would patient like information on creating a medical advance directive? No - Patient declined No - patient declined information Advance directive brochure given (Outpatient ONLY)   Pre-existing out of facility DNR order (yellow form or pink MOST form)    No    Current Medications (verified) Outpatient Encounter Medications as of 09/20/2022  Medication Sig   acetaminophen (TYLENOL) 500 MG tablet Take 1,000 mg by mouth every 6 (six) hours as needed for headache or mild pain.   albuterol (VENTOLIN HFA) 108 (90 Base) MCG/ACT inhaler INHALE 1 - 2 PUFFS INTO THE LUNGS EVERY 4 HOURS AS NEEDED FOR COUGH, WHEEZING, OR CHEST TIGHTNESS   Ascorbic Acid (VITAMIN C PO) Take 1 tablet by mouth daily.   Bacillus Coagulans-Inulin (PROBIOTIC) 1-250 BILLION-MG CAPS Take by mouth.   baclofen (LIORESAL) 20 MG tablet TAKE 1 TABLET(20 MG) BY MOUTH AT BEDTIME AS NEEDED FOR MUSCLE SPASMS   benzonatate (TESSALON) 100 MG capsule Take 1 capsule (100 mg total) by  mouth 3 (three) times daily as needed for cough.   famotidine (PEPCID) 40 MG tablet Take 1 tablet (40 mg total) by mouth at bedtime.   fluticasone (FLONASE) 50 MCG/ACT nasal spray Place 2 sprays into both nostrils daily as needed for allergies.   hydrOXYzine (VISTARIL) 25 MG capsule Take 25 mg by mouth daily.   lamoTRIgine (LAMICTAL) 100 MG tablet Take 100 mg by mouth daily.   LORazepam (ATIVAN) 0.5 MG tablet Take1 tablet by mouth daily as needed for severe anxiety (this is a 181-monthupply)   omeprazole (PRILOSEC) 40 MG capsule TAKE 1 CAPSULE (40 MG TOTAL) BY MOUTH DAILY.   potassium chloride SA (KLOR-CON M) 20 MEQ tablet TAKE 1 TABLET BY MOUTH DAILY. TAKE A 2ND TABLET DAILY AS NEEDED   promethazine (PHENERGAN) 12.5 MG tablet Take 1 tablet (12.5 mg total) by mouth every 8 (eight) hours as needed for nausea or vomiting.   rOPINIRole (REQUIP) 1 MG tablet Take 1 tablet by mouth at bedtime   rosuvastatin (CRESTOR) 40 MG tablet Take 1 tablet (40 mg total) by mouth daily.   torsemide (DEMADEX) 20 MG tablet Take 1 tablet (20 mg total) by mouth daily as needed (fluid).   traZODone (DESYREL) 50 MG tablet Take 1-2 tablets by mouth at bedtime for sleep as needed   VITAMIN D PO Take 1 tablet by mouth daily.   [DISCONTINUED] pramipexole (MIRAPEX) 0.25 MG tablet Take 1-2 tablets (0.25-0.5 mg total) by mouth at bedtime.   No facility-administered  encounter medications on file as of 09/20/2022.    Allergies (verified) Elemental sulfur, Carbamazepine, Ceclor [cefaclor], Morphine and related, and Atorvastatin   History: Past Medical History:  Diagnosis Date   Acute sinusitis 03/27/2013   Allergy    seasonal   Anxiety    Arthritis 06/09/2013   Depression    Dyspareunia    Edema extremities    Encounter for preventative adult health care exam with abnormal findings 04/21/2014   GERD (gastroesophageal reflux disease)    Hip pain, left 11/01/2011   History of proteinuria syndrome    Hyperlipidemia     Insomnia 03/11/2013   Low back pain 05/09/2011   Lymphadenitis 06/16/2012   Myalgia and myositis 02/15/2015   Obese    Onychomycosis 05/09/2011   Oral lesion 08/26/2013   Otitis externa 06/23/2012   left   Perimenopause 03/11/2013   RLS (restless legs syndrome)    RUQ pain 09/20/2011   Sleep apnea 04/21/2014   Tinea corporis 04/26/2017   Unspecified constipation 05/13/2013   Urinary frequency 08/26/2013   Past Surgical History:  Procedure Laterality Date   BACK SURGERY  2006   low, discectomy for herniated disc   BLADDER SUSPENSION N/A 11/07/2012   Procedure: [facility];  Surgeon: Malka So, MD;  Location: [facility];  Service: Urology;  Laterality: N/A;   CARPAL TUNNEL RELEASE Right 2006   CESAREAN SECTION  1993   CYSTOSCOPY N/A 11/07/2012   Procedure: CYSTOSCOPY;  Surgeon: Malka So, MD;  Location: [facility];  Service: Urology;  Laterality: N/A;   TUBAL LIGATION  1993   Family History  Problem Relation Age of Onset   Cancer Mother 74       thyroid, malignant brain tumor   Arthritis Mother        neck, back   Arthritis Father        back pain   Heart disease Father        stent   Hepatitis Sister        Hepatitis C   Heart disease Sister        stent   Breast cancer Maternal Aunt    Colon polyps Maternal Grandmother    Diabetes Maternal Grandmother    Colon cancer Neg Hx    Rectal cancer Neg Hx    Stomach cancer Neg Hx    Social History   Socioeconomic History   Marital status: Divorced    Spouse name: Not on file   Number of children: Not on file   Years of education: Not on file   Highest education level: Not on file  Occupational History   Not on file  Tobacco Use   Smoking status: Former    Packs/day: 0.50    Years: 20.00    Total pack years: 10.00    Types: Cigarettes    Quit date: 07/12/1992    Years since quitting: 30.2   Smokeless tobacco: Never  Substance and Sexual Activity   Alcohol use: Yes     Comment: occasionaly   Drug use: No   Sexual activity: Yes    Partners: Male  Other Topics Concern   Not on file  Social History Narrative   Not on file   Social Determinants of Health   Financial Resource Strain: Low Risk  (09/20/2022)   Overall Financial Resource Strain (CARDIA)    Difficulty of Paying Living Expenses: Not hard at all  Food Insecurity: No Food Insecurity (09/20/2022)  Hunger Vital Sign    Worried About Running Out of Food in the Last Year: Never true    Ran Out of Food in the Last Year: Never true  Transportation Needs: No Transportation Needs (09/20/2022)   PRAPARE - Hydrologist (Medical): No    Lack of Transportation (Non-Medical): No  Physical Activity: Insufficiently Active (09/20/2022)   Exercise Vital Sign    Days of Exercise per Week: 3 days    Minutes of Exercise per Session: 20 min  Stress: No Stress Concern Present (09/20/2022)   [place]    Feeling of Stress : Only a little  Social Connections: Moderately Isolated (09/20/2022)   Social Connection and Isolation Panel [NHANES]    Frequency of Communication with Friends and Family: More than three times a week    Frequency of Social Gatherings with Friends and Family: Three times a week    Attends Religious Services: More than 4 times per year    Active Member of Clubs or Organizations: No    Attends Archivist Meetings: Never    Marital Status: Divorced    Tobacco Counseling Counseling given: Not Answered   Clinical Intake:  Pre-visit preparation completed: Yes  Pain : No/denies pain  Diabetes: No  How often do you need to have someone help you when you read instructions, pamphlets, or other written materials from your doctor or pharmacy?: 1 - Never  Activities of Daily Living    09/20/2022    9:48 AM  In your present state of health, do you have any difficulty performing the  following activities:  Hearing? 0  Comment has ringing in right ear  Vision? 0  Difficulty concentrating or making decisions? 0  Walking or climbing stairs? 0  Dressing or bathing? 0  Doing errands, shopping? 0  Preparing Food and eating ? N  Using the Toilet? N  In the past six months, have you accidently leaked urine? Y  Do you have problems with loss of bowel control? N  Managing your Medications? N  Managing your Finances? N  Housekeeping or managing your Housekeeping? N    Patient Care Team: Mosie Lukes, MD as PCP - General (Family Medicine) Inda Castle, MD (Inactive) as Consulting Physician (Gastroenterology) Ralene Bathe, MD as Consulting Physician (Ophthalmology)  Indicate any recent Medical Services you may have received from other than [facility] providers in the past year (date may be approximate).     Assessment:   This is a routine wellness examination for [place].  Hearing/Vision screen No results found.  Dietary issues and exercise activities discussed: Current Exercise Habits: Home exercise routine, Type of exercise: walking, Time (Minutes): 15, Frequency (Times/Week): 3, Weekly Exercise (Minutes/Week): 45, Intensity: Mild, Exercise limited by: None identified   Goals Addressed   None    Depression Screen    09/20/2022    9:47 AM 09/07/2022   10:37 AM 05/11/2022   10:39 AM 01/07/2022   10:02 AM 06/25/2021   11:09 AM 06/24/2020   11:05 AM 02/22/2020    1:38 PM  PHQ 2/9 Scores  PHQ - 2 Score 0 0 0 '1 1 3 1  '$ PHQ- 9 Score  0 '3 3 3 5 6    '$ Fall Risk    09/20/2022    9:44 AM 09/07/2022   10:37 AM 05/11/2022   10:38 AM  Fall Risk   Falls in the past year? 0 0 0  Number falls in past yr: 0 0 0  Injury with Fall? 0 0 0  Risk for fall due to : No Fall Risks    Follow up Falls evaluation completed Falls evaluation completed Falls evaluation completed    [place]:  Any stairs in or around the home? Yes  If so,  are there any without handrails? No  Home free of loose throw rugs in walkways, pet beds, electrical cords, etc? Yes  Adequate lighting in your home to reduce risk of falls? Yes   ASSISTIVE DEVICES UTILIZED TO PREVENT FALLS:  Life alert? No  Use of a cane, walker or w/c? No  Grab bars in the bathroom? No  Shower chair or bench in shower? No  Elevated toilet seat or a handicapped toilet? No   TIMED UP AND GO:  Was the test performed?  No, audio visit .    Cognitive Function:        09/20/2022    9:56 AM  6CIT Screen  What Year? 0 points  What month? 0 points  What time? 0 points  Count back from 20 0 points  Months in reverse 0 points  Repeat phrase 0 points  Total Score 0 points    Immunizations Immunization History  Administered Date(s) Administered   Influenza Split 05/06/2011, 05/02/2012   Influenza,inj,Quad PF,6+ Mos 04/10/2013, 04/16/2014, 04/18/2015, 04/20/2016, 04/26/2017, 05/09/2018, 04/16/2019, 06/24/2020, 05/04/2021, 05/11/2022   PFIZER(Purple Top)SARS-COV-2 Vaccination 04/22/2020, 05/13/2020   PPD Test 10/09/2013   Pneumococcal Conjugate-13 04/10/2013   Pneumococcal Polysaccharide-23 04/26/2017   Tdap 11/10/2010, 05/11/2022    TDAP status: Up to date  Flu Vaccine status: Up to date  Pneumococcal vaccine status: Up to date  Covid-19 vaccine status: Declined, Education has been provided regarding the importance of this vaccine but patient still declined. Advised may receive this vaccine at local pharmacy or Health Dept.or vaccine clinic. Aware to provide a copy of the vaccination record if obtained from local pharmacy or Health Dept. Verbalized acceptance and understanding.  Qualifies for Shingles Vaccine? Yes   Zostavax completed No   Shingrix Completed?: No.    Education has been provided regarding the importance of this vaccine. Patient has been advised to call insurance company to determine out of pocket expense if they have not yet received this  vaccine. Advised may also receive vaccine at local pharmacy or Health Dept. Verbalized acceptance and understanding.  Screening Tests Health Maintenance  Topic Date Due   Medicare Annual Wellness (AWV)  Never done   Zoster Vaccines- Shingrix (1 of 2) Never done   COLONOSCOPY (Pts 45-69yr Insurance coverage will need to be confirmed)  02/07/2019   PAP SMEAR-Modifier  09/02/2019   MAMMOGRAM  03/17/2024   DTaP/Tdap/Td (3 - Td or Tdap) 05/11/2032   INFLUENZA VACCINE  Completed   Hepatitis C Screening  Completed   HIV Screening  Completed   HPV VACCINES  Aged Out   COVID-19 Vaccine  Discontinued    Health Maintenance  Health Maintenance Due  Topic Date Due   Medicare Annual Wellness (AWV)  Never done   Zoster Vaccines- Shingrix (1 of 2) Never done   COLONOSCOPY (Pts 45-430yrInsurance coverage will need to be confirmed)  02/07/2019   PAP SMEAR-Modifier  09/02/2019    Colorectal cancer screening: Referral to GI placed 04/27/22. Pt aware the office will call re: appt.  Mammogram status: Completed 03/17/22. Repeat every year  Lung Cancer Screening: (Low Dose CT Chest recommended if Age 65-80  years, 30 pack-year currently smoking OR have quit w/in 15years.) does not qualify.   Additional Screening:  Hepatitis C Screening: does qualify; Completed 04/18/15  Vision Screening: Recommended annual ophthalmology exams for early detection of glaucoma and other disorders of the eye. Is the patient up to date with their annual eye exam?   No, calling to make appointment today Who is the provider or what is the name of the office in which the patient attends annual eye exams? Doesn't have new eye doctor yet, calling [facility] If pt is not established with a provider, would they like to be referred to a provider to establish care? No .   Dental Screening: Recommended annual dental exams for proper oral hygiene  Community Resource Referral / Chronic Care Management: CRR required this visit?   No   CCM required this visit?  No      Plan:     I have personally reviewed and noted the following in the patient's chart:   Medical and social history Use of alcohol, tobacco or illicit drugs  Current medications and supplements including opioid prescriptions. Patient is not currently taking opioid prescriptions. Functional ability and status Nutritional status Physical activity Advanced directives List of other physicians Hospitalizations, surgeries, and ER visits in previous 12 months Vitals Screenings to include cognitive, depression, and falls Referrals and appointments  In addition, I have reviewed and discussed with patient certain preventive protocols, quality metrics, and best practice recommendations. A written personalized care plan for preventive services as well as general preventive health recommendations were provided to patient.   Due to this being a telephonic visit, the after visit summary with patients personalized plan was offered to patient via mail or my-chart. Patient declined at this time.  Beatris Ship, [place]   09/20/2022   Nurse Notes: None

## 2022-09-22 DIAGNOSIS — F5102 Adjustment insomnia: Secondary | ICD-10-CM | POA: Diagnosis not present

## 2022-09-22 DIAGNOSIS — F4323 Adjustment disorder with mixed anxiety and depressed mood: Secondary | ICD-10-CM | POA: Diagnosis not present

## 2022-09-22 DIAGNOSIS — G2581 Restless legs syndrome: Secondary | ICD-10-CM | POA: Diagnosis not present

## 2022-09-22 DIAGNOSIS — F411 Generalized anxiety disorder: Secondary | ICD-10-CM | POA: Diagnosis not present

## 2022-09-30 ENCOUNTER — Other Ambulatory Visit: Payer: Self-pay | Admitting: Family Medicine

## 2022-10-13 DIAGNOSIS — L821 Other seborrheic keratosis: Secondary | ICD-10-CM | POA: Diagnosis not present

## 2022-10-13 DIAGNOSIS — L738 Other specified follicular disorders: Secondary | ICD-10-CM | POA: Diagnosis not present

## 2022-10-13 DIAGNOSIS — D225 Melanocytic nevi of trunk: Secondary | ICD-10-CM | POA: Diagnosis not present

## 2022-10-13 DIAGNOSIS — I781 Nevus, non-neoplastic: Secondary | ICD-10-CM | POA: Diagnosis not present

## 2022-10-20 DIAGNOSIS — F5102 Adjustment insomnia: Secondary | ICD-10-CM | POA: Diagnosis not present

## 2022-10-20 DIAGNOSIS — F4323 Adjustment disorder with mixed anxiety and depressed mood: Secondary | ICD-10-CM | POA: Diagnosis not present

## 2022-10-20 DIAGNOSIS — F411 Generalized anxiety disorder: Secondary | ICD-10-CM | POA: Diagnosis not present

## 2022-10-20 DIAGNOSIS — G2581 Restless legs syndrome: Secondary | ICD-10-CM | POA: Diagnosis not present

## 2022-11-03 DIAGNOSIS — F5102 Adjustment insomnia: Secondary | ICD-10-CM | POA: Diagnosis not present

## 2022-11-03 DIAGNOSIS — F411 Generalized anxiety disorder: Secondary | ICD-10-CM | POA: Diagnosis not present

## 2022-11-03 DIAGNOSIS — G2581 Restless legs syndrome: Secondary | ICD-10-CM | POA: Diagnosis not present

## 2022-11-03 DIAGNOSIS — F4323 Adjustment disorder with mixed anxiety and depressed mood: Secondary | ICD-10-CM | POA: Diagnosis not present

## 2022-11-09 DIAGNOSIS — F5102 Adjustment insomnia: Secondary | ICD-10-CM | POA: Diagnosis not present

## 2022-11-09 DIAGNOSIS — F4323 Adjustment disorder with mixed anxiety and depressed mood: Secondary | ICD-10-CM | POA: Diagnosis not present

## 2022-11-09 DIAGNOSIS — F23 Brief psychotic disorder: Secondary | ICD-10-CM | POA: Diagnosis not present

## 2022-12-01 DIAGNOSIS — F411 Generalized anxiety disorder: Secondary | ICD-10-CM | POA: Diagnosis not present

## 2022-12-01 DIAGNOSIS — F5102 Adjustment insomnia: Secondary | ICD-10-CM | POA: Diagnosis not present

## 2022-12-01 DIAGNOSIS — F4323 Adjustment disorder with mixed anxiety and depressed mood: Secondary | ICD-10-CM | POA: Diagnosis not present

## 2022-12-01 DIAGNOSIS — G2581 Restless legs syndrome: Secondary | ICD-10-CM | POA: Diagnosis not present

## 2022-12-20 ENCOUNTER — Other Ambulatory Visit: Payer: Self-pay | Admitting: Family Medicine

## 2022-12-29 DIAGNOSIS — F4323 Adjustment disorder with mixed anxiety and depressed mood: Secondary | ICD-10-CM | POA: Diagnosis not present

## 2022-12-29 DIAGNOSIS — F5102 Adjustment insomnia: Secondary | ICD-10-CM | POA: Diagnosis not present

## 2022-12-29 DIAGNOSIS — G2581 Restless legs syndrome: Secondary | ICD-10-CM | POA: Diagnosis not present

## 2022-12-29 DIAGNOSIS — F411 Generalized anxiety disorder: Secondary | ICD-10-CM | POA: Diagnosis not present

## 2023-01-06 ENCOUNTER — Ambulatory Visit: Payer: Medicare Other | Admitting: Family Medicine

## 2023-01-10 NOTE — Assessment & Plan Note (Signed)
hgba1c acceptable, minimize simple carbs. Increase exercise as tolerated.  

## 2023-01-10 NOTE — Progress Notes (Unsigned)
Subjective:    Patient ID: [name], female    DOB: August 03, 1955, 67 y.o.   MRN: [id number]  No chief complaint on file.   HPI Discussed the use of AI scribe software for clinical note transcription with the patient, who gave verbal consent to proceed.  History of Present Illness         Patient is a 67 yo female in today for follow up on chronic medical concerns. No recent febrile illness or hospitalizations. Denies CP/palp/SOB/HA/congestion/fevers/GI or GU c/o. Taking meds as prescribed    Past Medical History:  Diagnosis Date  . Acute sinusitis 03/27/2013  . Allergy    seasonal  . Anxiety   . Arthritis 06/09/2013  . Depression   . Dyspareunia   . Edema extremities   . Encounter for preventative adult health care exam with abnormal findings 04/21/2014  . GERD (gastroesophageal reflux disease)   . Hip pain, left 11/01/2011  . History of proteinuria syndrome   . Hyperlipidemia   . Insomnia 03/11/2013  . Low back pain 05/09/2011  . Lymphadenitis 06/16/2012  . Myalgia and myositis 02/15/2015  . Obese   . Onychomycosis 05/09/2011  . Oral lesion 08/26/2013  . Otitis externa 06/23/2012   left  . Perimenopause 03/11/2013  . RLS (restless legs syndrome)   . RUQ pain 09/20/2011  . Sleep apnea 04/21/2014  . Tinea corporis 04/26/2017  . Unspecified constipation 05/13/2013  . Urinary frequency 08/26/2013    Past Surgical History:  Procedure Laterality Date  . BACK SURGERY  2006   low, discectomy for herniated disc  . BLADDER SUSPENSION N/A 11/07/2012   Procedure: [facility] SL[facility];  Surgeon: Anner Crete, MD;  Location[facility];  Service: Urology;  Laterality: N/A;  . CARPAL TUNNEL RELEASE Right 2006  . CESAREAN SECTION  1993  . CYSTOSCOPY N/A 11/07/2012   Procedure: CYSTOSCOPY;  Surgeon: Anner Crete, MD;  Location: [facility];  Service: Urology;  Laterality: N/A;  . TUBAL LIGATION  1993    Family History  Problem Relation Age of  Onset  . Cancer Mother 75       thyroid, malignant brain tumor  . Arthritis Mother        neck, back  . Arthritis Father        back pain  . Heart disease Father        stent  . Hepatitis Sister        Hepatitis C  . Heart disease Sister        stent  . Breast cancer Maternal Aunt   . Colon polyps Maternal Grandmother   . Diabetes Maternal Grandmother   . Colon cancer Neg Hx   . Rectal cancer Neg Hx   . Stomach cancer Neg Hx     Social History   Socioeconomic History  . Marital status: Divorced    Spouse name: Not on file  . Number of children: Not on file  . Years of education: Not on file  . Highest education level: Not on file  Occupational History  . Not on file  Tobacco Use  . Smoking status: Former    Packs/day: 0.50    Years: 20.00    Additional pack years: 0.00    Total pack years: 10.00    Types: Cigarettes    Quit date: 07/12/1992    Years since quitting: 30.5  . Smokeless tobacco: Never  Substance and Sexual Activity  . Alcohol use: Yes  Comment: occasionaly  . Drug use: No  . Sexual activity: Yes    Partners: Male  Other Topics Concern  . Not on file  Social History Narrative  . Not on file   Social Determinants of Health   Financial Resource Strain: Low Risk  (09/20/2022)   Overall Financial Resource Strain (CARDIA)   . Difficulty of Paying Living Expenses: Not hard at all  Food Insecurity: No Food Insecurity (09/20/2022)   Hunger Vital Sign   . Worried About Programme researcher, broadcasting/film/video in the Last Year: Never true   . Ran Out of Food in the Last Year: Never true  Transportation Needs: No Transportation Needs (09/20/2022)   PRAPARE - Transportation   . Lack of Transportation (Medical): No   . Lack of Transportation (Non-Medical): No  Physical Activity: Insufficiently Active (09/20/2022)   Exercise Vital Sign   . Days of Exercise per Week: 3 days   . Minutes of Exercise per Session: 20 min  Stress: No Stress Concern Present (09/20/2022)   [employer] of Occupational Health - Occupational Stress Questionnaire   . Feeling of Stress : Only a little  Social Connections: Moderately Isolated (09/20/2022)   Social Connection and Isolation Panel [NHANES]   . Frequency of Communication with Friends and Family: More than three times a week   . Frequency of Social Gatherings with Friends and Family: Three times a week   . Attends Religious Services: More than 4 times per year   . Active Member of Clubs or Organizations: No   . Attends Banker Meetings: Never   . Marital Status: Divorced  Catering manager Violence: Not At Risk (09/20/2022)   Humiliation, Afraid, Rape, and Kick questionnaire   . Fear of Current or Ex-Partner: No   . Emotionally Abused: No   . Physically Abused: No   . Sexually Abused: No    Outpatient Medications Prior to Visit  Medication Sig Dispense Refill  . acetaminophen (TYLENOL) 500 MG tablet Take 1,000 mg by mouth every 6 (six) hours as needed for headache or mild pain.    Marland Kitchen albuterol (VENTOLIN HFA) 108 (90 Base) MCG/ACT inhaler INHALE 1 - 2 PUFFS INTO THE LUNGS EVERY 4 HOURS AS NEEDED FOR COUGH, WHEEZING, OR CHEST TIGHTNESS 18 g 3  . Ascorbic Acid (VITAMIN C PO) Take 1 tablet by mouth daily.    . Bacillus Coagulans-Inulin (PROBIOTIC) 1-250 BILLION-MG CAPS Take by mouth.    . baclofen (LIORESAL) 20 MG tablet TAKE 1 TABLET(20 MG) BY MOUTH AT BEDTIME AS NEEDED FOR MUSCLE SPASMS 90 tablet 1  . benzonatate (TESSALON) 100 MG capsule Take 1 capsule (100 mg total) by mouth 3 (three) times daily as needed for cough. 30 capsule 0  . famotidine (PEPCID) 40 MG tablet Take 1 tablet (40 mg total) by mouth at bedtime. 90 tablet 3  . fluticasone (FLONASE) 50 MCG/ACT nasal spray Place 2 sprays into both nostrils daily as needed for allergies. 16 g 1  . hydrOXYzine (VISTARIL) 25 MG capsule Take 25 mg by mouth daily.    Marland Kitchen lamoTRIgine (LAMICTAL) 100 MG tablet Take 100 mg by mouth daily.    Marland Kitchen LORazepam (ATIVAN) 0.5 MG  tablet Take1 tablet by mouth daily as needed for severe anxiety (this is a 48-month supply) 20 tablet 1  . omeprazole (PRILOSEC) 40 MG capsule TAKE 1 CAPSULE (40 MG TOTAL) BY MOUTH DAILY. 90 capsule 1  . potassium chloride SA (KLOR-CON M) 20 MEQ tablet TAKE 1 TABLET BY  MOUTH DAILY. TAKE A 2ND TABLET DAILY AS NEEDED 100 tablet 1  . promethazine (PHENERGAN) 12.5 MG tablet TAKE 1 TABLET(12.5 MG) BY MOUTH EVERY 8 HOURS AS NEEDED FOR NAUSEA OR VOMITING 30 tablet 1  . rOPINIRole (REQUIP) 1 MG tablet Take 1 tablet by mouth at bedtime 60 tablet 0  . rosuvastatin (CRESTOR) 40 MG tablet TAKE 1 TABLET(40 MG) BY MOUTH DAILY 90 tablet 1  . torsemide (DEMADEX) 20 MG tablet Take 1 tablet (20 mg total) by mouth daily as needed (fluid). 30 tablet 3  . traZODone (DESYREL) 50 MG tablet Take 1-2 tablets by mouth at bedtime for sleep as needed 120 tablet 0  . VITAMIN D PO Take 1 tablet by mouth daily.     No facility-administered medications prior to visit.    Allergies  Allergen Reactions  . Elemental Sulfur Diarrhea and Nausea And Vomiting  . Carbamazepine Hives  . Ceclor [Cefaclor] Other (See Comments)    Tongue swell  . Morphine And Codeine Itching  . Atorvastatin Other (See Comments)    Leg pain    Review of Systems  Constitutional:  Negative for fever and malaise/fatigue.  HENT:  Negative for congestion.   Eyes:  Negative for blurred vision.  Respiratory:  Negative for shortness of breath.   Cardiovascular:  Negative for chest pain, palpitations and leg swelling.  Gastrointestinal:  Negative for abdominal pain, blood in stool and nausea.  Genitourinary:  Negative for dysuria and frequency.  Musculoskeletal:  Negative for falls.  Skin:  Negative for rash.  Neurological:  Negative for dizziness, loss of consciousness and headaches.  Endo/Heme/Allergies:  Negative for environmental allergies.  Psychiatric/Behavioral:  Negative for depression. The patient is not nervous/anxious.       Objective:     Physical Exam Constitutional:      General: She is not in acute distress.    Appearance: Normal appearance. She is well-developed. She is not toxic-appearing.  HENT:     Head: Normocephalic and atraumatic.     Right Ear: External ear normal.     Left Ear: External ear normal.     Nose: Nose normal.  Eyes:     General:        Right eye: No discharge.        Left eye: No discharge.     Conjunctiva/sclera: Conjunctivae normal.  Neck:     Thyroid: No thyromegaly.  Cardiovascular:     Rate and Rhythm: Normal rate and regular rhythm.     Heart sounds: Normal heart sounds. No murmur heard. Pulmonary:     Effort: Pulmonary effort is normal. No respiratory distress.     Breath sounds: Normal breath sounds.  Abdominal:     General: Bowel sounds are normal.     Palpations: Abdomen is soft.     Tenderness: There is no abdominal tenderness. There is no guarding.  Musculoskeletal:        General: Normal range of motion.     Cervical back: Neck supple.  Lymphadenopathy:     Cervical: No cervical adenopathy.  Skin:    General: Skin is warm and dry.  Neurological:     Mental Status: She is alert and oriented to person, place, and time.  Psychiatric:        Mood and Affect: Mood normal.        Behavior: Behavior normal.        Thought Content: Thought content normal.        Judgment: Judgment normal.  LMP 10/03/2012  Wt Readings from Last 3 Encounters:  09/07/22 208 lb 6.4 oz (94.5 kg)  07/22/22 208 lb (94.3 kg)  05/19/22 215 lb (97.5 kg)    Diabetic Foot Exam - Simple   No data filed    Lab Results  Component Value Date   WBC 5.9 07/22/2022   HGB 14.7 07/22/2022   HCT 44.1 07/22/2022   PLT 233.0 07/22/2022   GLUCOSE 90 07/22/2022   CHOL 163 05/11/2022   TRIG 220.0 (H) 05/11/2022   HDL 52.70 05/11/2022   LDLDIRECT 82.0 05/11/2022   LDLCALC 66 01/07/2022   ALT 17 05/11/2022   AST 15 05/11/2022   NA 140 07/22/2022   K 4.3 07/22/2022   CL 106 07/22/2022    CREATININE 0.80 07/22/2022   BUN 10 07/22/2022   CO2 28 07/22/2022   TSH 1.27 05/11/2022   HGBA1C 5.5 05/11/2022    Lab Results  Component Value Date   TSH 1.27 05/11/2022   Lab Results  Component Value Date   WBC 5.9 07/22/2022   HGB 14.7 07/22/2022   HCT 44.1 07/22/2022   MCV 89.4 07/22/2022   PLT 233.0 07/22/2022   Lab Results  Component Value Date   NA 140 07/22/2022   K 4.3 07/22/2022   CO2 28 07/22/2022   GLUCOSE 90 07/22/2022   BUN 10 07/22/2022   CREATININE 0.80 07/22/2022   BILITOT 0.8 05/11/2022   ALKPHOS 46 05/11/2022   AST 15 05/11/2022   ALT 17 05/11/2022   PROT 6.7 05/11/2022   ALBUMIN 4.4 05/11/2022   CALCIUM 9.2 07/22/2022   ANIONGAP 7 05/23/2021   GFR 77.80 07/22/2022   Lab Results  Component Value Date   CHOL 163 05/11/2022   Lab Results  Component Value Date   HDL 52.70 05/11/2022   Lab Results  Component Value Date   LDLCALC 66 01/07/2022   Lab Results  Component Value Date   TRIG 220.0 (H) 05/11/2022   Lab Results  Component Value Date   CHOLHDL 3 05/11/2022   Lab Results  Component Value Date   HGBA1C 5.5 05/11/2022       Assessment & Plan:  Hyperglycemia Assessment & Plan: hgba1c acceptable, minimize simple carbs. Increase exercise as tolerated.       Assessment and Plan              Danise Edge, MD

## 2023-01-11 ENCOUNTER — Ambulatory Visit (INDEPENDENT_AMBULATORY_CARE_PROVIDER_SITE_OTHER): Payer: Medicare Other | Admitting: Family Medicine

## 2023-01-11 VITALS — BP 100/60 | HR 75 | Temp 97.9°F | Resp 12 | Ht 65.0 in | Wt 198.0 lb

## 2023-01-11 DIAGNOSIS — R252 Cramp and spasm: Secondary | ICD-10-CM | POA: Diagnosis not present

## 2023-01-11 DIAGNOSIS — E782 Mixed hyperlipidemia: Secondary | ICD-10-CM | POA: Diagnosis not present

## 2023-01-11 DIAGNOSIS — R739 Hyperglycemia, unspecified: Secondary | ICD-10-CM

## 2023-01-11 DIAGNOSIS — G4700 Insomnia, unspecified: Secondary | ICD-10-CM

## 2023-01-11 DIAGNOSIS — R600 Localized edema: Secondary | ICD-10-CM | POA: Diagnosis not present

## 2023-01-11 DIAGNOSIS — Z1211 Encounter for screening for malignant neoplasm of colon: Secondary | ICD-10-CM

## 2023-01-11 MED ORDER — PROMETHAZINE HCL 12.5 MG PO TABS: ORAL_TABLET | ORAL | 3 refills | Status: DC

## 2023-01-11 MED ORDER — ROSUVASTATIN CALCIUM 40 MG PO TABS: ORAL_TABLET | ORAL | 1 refills | Status: DC

## 2023-01-11 MED ORDER — TORSEMIDE 20 MG PO TABS: 20.0000 mg | ORAL_TABLET | Freq: Every day | ORAL | 3 refills | Status: DC | PRN

## 2023-01-11 MED ORDER — POTASSIUM CHLORIDE CRYS ER 20 MEQ PO TBCR: EXTENDED_RELEASE_TABLET | ORAL | 1 refills | Status: DC

## 2023-01-11 NOTE — Patient Instructions (Addendum)
[place]   Edema  Edema is an abnormal buildup of fluids in the body tissues and under the skin. Swelling of the legs, feet, and ankles is a common symptom that becomes more likely as you get older. Swelling is also common in looser tissues, such as around the eyes. Pressing on the area may make a temporary dent in your skin (pitting edema). This fluid may also accumulate in your lungs (pulmonary edema). There are many possible causes of edema. Eating too much salt (sodium) and being on your feet or sitting for a long time can cause edema in your legs, feet, and ankles. Common causes of edema include: Certain medical conditions, such as heart failure, liver or kidney disease, and cancer. Weak leg blood vessels. An injury. Pregnancy. Medicines. Being obese. Low protein levels in the blood. Hot weather may make edema worse. Edema is usually painless. Your skin may look swollen or shiny. Follow these instructions at home: Medicines Take over-the-counter and prescription medicines only as told by your health care provider. Your health care provider may prescribe a medicine to help your body get rid of extra water (diuretic). Take this medicine if you are told to take it. Eating and drinking Eat a low-salt (low-sodium) diet to reduce fluid as told by your health care provider. Sometimes, eating less salt may reduce swelling. Depending on the cause of your swelling, you may need to limit how much fluid you drink (fluid restriction). General instructions Raise (elevate) the injured area above the level of your heart while you are sitting or lying down. Do not sit still or stand for long periods of time. Do not wear tight clothing. Do not wear garters on your upper legs. Exercise your legs to get your circulation going. This helps to move the fluid back into your blood vessels, and it may help the swelling go down. Wear compression stockings as told by your health care provider. These stockings help  to prevent blood clots and reduce swelling in your legs. It is important that these are the correct size. These stockings should be prescribed by your health care provider to prevent possible injuries. If elastic bandages or wraps are recommended, use them as told by your health care provider. Contact a health care provider if: Your edema does not get better with treatment. You have heart, liver, or kidney disease and have symptoms of edema. You have sudden and unexplained weight gain. Get help right away if: You develop shortness of breath or chest pain. You cannot breathe when you lie down. You develop pain, redness, or warmth in the swollen areas. You have heart, liver, or kidney disease and suddenly get edema. You have a fever and your symptoms suddenly get worse. These symptoms may be an emergency. Get help right away. Call 911. Do not wait to see if the symptoms will go away. Do not drive yourself to the hospital. Summary Edema is an abnormal buildup of fluids in the body tissues and under the skin. Eating too much salt (sodium)and being on your feet or sitting for a long time can cause edema in your legs, feet, and ankles. Raise (elevate) the injured area above the level of your heart while you are sitting or lying down. Follow your health care provider's instructions about diet and how much fluid you can drink. This information is not intended to replace advice given to you by your health care provider. Make sure you discuss any questions you have with your health care provider. Document  Revised: 03/02/2021 Document Reviewed: 03/02/2021 Elsevier Patient Education  2024 [employer].

## 2023-01-11 NOTE — Assessment & Plan Note (Signed)
Encouraged good sleep hygiene such as dark, quiet room. No blue/green glowing lights such as computer screens in bedroom. No alcohol or stimulants in evening. Cut down on caffeine as able. Regular exercise is helpful but not just prior to bed time.  

## 2023-01-12 LAB — CBC
HCT: 45 % (ref 36.0–46.0)
Hemoglobin: 14.7 g/dL (ref 12.0–15.0)
MCHC: 32.6 g/dL (ref 30.0–36.0)
MCV: 87.2 fl (ref 78.0–100.0)
Platelets: 245 10*3/uL (ref 150.0–400.0)
RBC: 5.16 Mil/uL — ABNORMAL HIGH (ref 3.87–5.11)
RDW: 13.5 % (ref 11.5–15.5)
WBC: 8.4 10*3/uL (ref 4.0–10.5)

## 2023-01-12 LAB — COMPREHENSIVE METABOLIC PANEL
ALT: 17 U/L (ref 0–35)
AST: 16 U/L (ref 0–37)
Albumin: 4.6 g/dL (ref 3.5–5.2)
Alkaline Phosphatase: 53 U/L (ref 39–117)
BUN: 18 mg/dL (ref 6–23)
CO2: 29 mEq/L (ref 19–32)
Calcium: 10 mg/dL (ref 8.4–10.5)
Chloride: 101 mEq/L (ref 96–112)
Creatinine, Ser: 0.93 mg/dL (ref 0.40–1.20)
GFR: 64.72 mL/min (ref >60.00)
Glucose, Bld: 79 mg/dL (ref 70–99)
Potassium: 4.1 mEq/L (ref 3.5–5.1)
Sodium: 139 mEq/L (ref 135–145)
Total Bilirubin: 0.8 mg/dL (ref 0.2–1.2)
Total Protein: 7.1 g/dL (ref 6.0–8.3)

## 2023-01-12 LAB — LIPID PANEL
Cholesterol: 166 mg/dL (ref 0–200)
HDL: 41 mg/dL (ref >39.00)
NonHDL: 124.93
Total CHOL/HDL Ratio: 4
Triglycerides: 297 mg/dL — ABNORMAL HIGH (ref 0.0–149.0)
VLDL: 59.4 mg/dL — ABNORMAL HIGH (ref 0.0–40.0)

## 2023-01-12 LAB — HEMOGLOBIN A1C: Hgb A1c MFr Bld: 5.5 % (ref 4.6–6.5)

## 2023-01-12 LAB — TSH: TSH: 1.46 u[IU]/mL (ref 0.35–5.50)

## 2023-01-12 LAB — LDL CHOLESTEROL, DIRECT: Direct LDL: 80 mg/dL

## 2023-01-12 NOTE — Assessment & Plan Note (Signed)
Elevate, minimize sodium compression socks, diuretics

## 2023-01-12 NOTE — Assessment & Plan Note (Signed)
Hydrate and monitor 

## 2023-01-12 NOTE — Assessment & Plan Note (Signed)
Encourage heart healthy diet such as MIND or DASH diet, increase exercise, avoid trans fats, simple carbohydrates and processed foods, consider a krill or fish or flaxseed oil cap daily.  °

## 2023-01-20 ENCOUNTER — Other Ambulatory Visit: Payer: Self-pay | Admitting: Family Medicine

## 2023-01-20 DIAGNOSIS — R252 Cramp and spasm: Secondary | ICD-10-CM

## 2023-01-26 DIAGNOSIS — F4323 Adjustment disorder with mixed anxiety and depressed mood: Secondary | ICD-10-CM | POA: Diagnosis not present

## 2023-01-26 DIAGNOSIS — F411 Generalized anxiety disorder: Secondary | ICD-10-CM | POA: Diagnosis not present

## 2023-01-26 DIAGNOSIS — G2581 Restless legs syndrome: Secondary | ICD-10-CM | POA: Diagnosis not present

## 2023-01-26 DIAGNOSIS — F5102 Adjustment insomnia: Secondary | ICD-10-CM | POA: Diagnosis not present

## 2023-02-23 DIAGNOSIS — F4323 Adjustment disorder with mixed anxiety and depressed mood: Secondary | ICD-10-CM | POA: Diagnosis not present

## 2023-02-23 DIAGNOSIS — G2581 Restless legs syndrome: Secondary | ICD-10-CM | POA: Diagnosis not present

## 2023-02-23 DIAGNOSIS — F5102 Adjustment insomnia: Secondary | ICD-10-CM | POA: Diagnosis not present

## 2023-02-23 DIAGNOSIS — F411 Generalized anxiety disorder: Secondary | ICD-10-CM | POA: Diagnosis not present

## 2023-02-24 ENCOUNTER — Encounter (INDEPENDENT_AMBULATORY_CARE_PROVIDER_SITE_OTHER): Payer: Self-pay

## 2023-03-07 DIAGNOSIS — K080 Exfoliation of teeth due to systemic causes: Secondary | ICD-10-CM | POA: Diagnosis not present

## 2023-04-04 NOTE — Assessment & Plan Note (Signed)
Hydrate and monitor 

## 2023-04-04 NOTE — Assessment & Plan Note (Signed)
Doing well at present time, no changes to meds

## 2023-04-04 NOTE — Assessment & Plan Note (Signed)
Patient encouraged to maintain heart healthy diet, regular exercise, adequate sleep. Consider daily probiotics. Take medications as prescribed Labs ordered and reviewed Given and reviewed copy of ACP documents from [facility] Secretary of State and encouraged to complete and return  Colonoscopy 2010, agrees to Cologuard today, ordered Mgm 03/2022 repeat annually, ordered Dexa ordered Pap 2018 repeat this year, [facility], has declined further evaluations due to pain, no Sexual activity in over 20 years.

## 2023-04-04 NOTE — Assessment & Plan Note (Signed)
hgba1c acceptable, minimize simple carbs. Increase exercise as tolerated.  

## 2023-04-04 NOTE — Assessment & Plan Note (Signed)
Encourage heart healthy diet such as MIND or DASH diet, increase exercise, avoid trans fats, simple carbohydrates and processed foods, consider a krill or fish or flaxseed oil cap daily.  °

## 2023-04-05 ENCOUNTER — Other Ambulatory Visit (HOSPITAL_BASED_OUTPATIENT_CLINIC_OR_DEPARTMENT_OTHER): Payer: Self-pay

## 2023-04-05 ENCOUNTER — Other Ambulatory Visit: Payer: Self-pay

## 2023-04-05 ENCOUNTER — Ambulatory Visit: Payer: Medicare Other | Admitting: Family Medicine

## 2023-04-05 ENCOUNTER — Encounter: Payer: Self-pay | Admitting: Family Medicine

## 2023-04-05 VITALS — BP 105/62 | HR 64 | Temp 98.0°F | Resp 16 | Ht 65.0 in | Wt 192.8 lb

## 2023-04-05 DIAGNOSIS — M791 Myalgia, unspecified site: Secondary | ICD-10-CM

## 2023-04-05 DIAGNOSIS — F4323 Adjustment disorder with mixed anxiety and depressed mood: Secondary | ICD-10-CM

## 2023-04-05 DIAGNOSIS — Z0000 Encounter for general adult medical examination without abnormal findings: Secondary | ICD-10-CM

## 2023-04-05 DIAGNOSIS — R739 Hyperglycemia, unspecified: Secondary | ICD-10-CM

## 2023-04-05 DIAGNOSIS — Z780 Asymptomatic menopausal state: Secondary | ICD-10-CM | POA: Diagnosis not present

## 2023-04-05 DIAGNOSIS — R252 Cramp and spasm: Secondary | ICD-10-CM | POA: Diagnosis not present

## 2023-04-05 DIAGNOSIS — Z1231 Encounter for screening mammogram for malignant neoplasm of breast: Secondary | ICD-10-CM

## 2023-04-05 DIAGNOSIS — E782 Mixed hyperlipidemia: Secondary | ICD-10-CM | POA: Diagnosis not present

## 2023-04-05 DIAGNOSIS — Z23 Encounter for immunization: Secondary | ICD-10-CM

## 2023-04-05 DIAGNOSIS — E2839 Other primary ovarian failure: Secondary | ICD-10-CM | POA: Diagnosis not present

## 2023-04-05 MED ORDER — HYDROXYZINE PAMOATE 25 MG PO CAPS: 25.0000 mg | ORAL_CAPSULE | Freq: Every day | ORAL | 1 refills | Status: DC

## 2023-04-05 MED ORDER — LORAZEPAM 0.5 MG PO TABS: ORAL_TABLET | ORAL | 5 refills | Status: DC

## 2023-04-05 MED ORDER — ROPINIROLE HCL 1 MG PO TABS: ORAL_TABLET | ORAL | 1 refills | Status: DC

## 2023-04-05 MED ORDER — TRAZODONE HCL 50 MG PO TABS: ORAL_TABLET | ORAL | 1 refills | Status: DC

## 2023-04-05 MED ORDER — ZOSTER VAC RECOMB ADJUVANTED 50 MCG/0.5ML IM SUSR
0.5000 mL | Freq: Once | INTRAMUSCULAR | 0 refills | Status: AC
Start: 2023-04-05 — End: 2023-04-06
  Filled 2023-04-05: qty 0.5, 1d supply, fill #0

## 2023-04-05 MED ORDER — BUPROPION HCL ER (XL) 150 MG PO TB24: 150.0000 mg | ORAL_TABLET | Freq: Every morning | ORAL | 1 refills | Status: DC

## 2023-04-05 NOTE — Progress Notes (Signed)
Subjective:    Patient ID: SUNG GAVAGHAN, female    DOB: [birth year], 67 y.o.   MRN: [id number]  Chief Complaint  Patient presents with  . Annual Exam    Annual Exam    HPI Discussed the use of AI scribe software for clinical note transcription with the patient, who gave verbal consent to proceed.  History of Present Illness   The patient, with a history of restless legs, anxiety, sleep issues, itching, and depression, presents for a routine check-up. They report that their current medications, including Ropinirole, Lorazepam, Trazodone, Hydroxyzine, and Wellbutrin, are managing their symptoms effectively. The patient also mentions discomfort in their belly area, which they describe as a bothersome issue. The patient has not been active sexually for over 20 years and has been experiencing some pain during Pap smears, leading to their reluctance to undergo the procedure. They also express concern about their elevated triglycerides and the need to watch their diet and increase physical activity.        Past Medical History:  Diagnosis Date  . Acute sinusitis 03/27/2013  . Allergy    seasonal  . Anxiety   . Arthritis 06/09/2013  . Depression   . Dyspareunia   . Edema extremities   . Encounter for preventative adult health care exam with abnormal findings 04/21/2014  . GERD (gastroesophageal reflux disease)   . Hip pain, left 11/01/2011  . History of proteinuria syndrome   . Hyperlipidemia   . Insomnia 03/11/2013  . Low back pain 05/09/2011  . Lymphadenitis 06/16/2012  . Myalgia and myositis 02/15/2015  . Obese   . Onychomycosis 05/09/2011  . Oral lesion 08/26/2013  . Otitis externa 06/23/2012   left  . Perimenopause 03/11/2013  . RLS (restless legs syndrome)   . RUQ pain 09/20/2011  . Sleep apnea 04/21/2014  . Tinea corporis 04/26/2017  . Unspecified constipation 05/13/2013  . Urinary frequency 08/26/2013    Past Surgical History:  Procedure Laterality Date  .  BACK SURGERY  2006   low, discectomy for herniated disc  . BLADDER SUSPENSION N/A 11/07/2012   Procedure: [facility];  Surgeon: Anner Crete, MD;  Location: [facility];  Service: Urology;  Laterality: N/A;  . CARPAL TUNNEL RELEASE Right 2006  . CESAREAN SECTION  1993  . CYSTOSCOPY N/A 11/07/2012   Procedure: CYSTOSCOPY;  Surgeon: Anner Crete, MD;  Location: [facility];  Service: Urology;  Laterality: N/A;  . TUBAL LIGATION  1993    Family History  Problem Relation Age of Onset  . Cancer Mother 67       thyroid, malignant brain tumor  . Arthritis Mother        neck, back  . Arthritis Father        back pain  . Heart disease Father        stent  . Hepatitis Sister        Hepatitis C  . Heart disease Sister        stent  . Breast cancer Maternal Aunt   . Colon polyps Maternal Grandmother   . Diabetes Maternal Grandmother   . Colon cancer Neg Hx   . Rectal cancer Neg Hx   . Stomach cancer Neg Hx     Social History   Socioeconomic History  . Marital status: Divorced    Spouse name: Not on file  . Number of children: Not on file  . Years of education: Not on file  .  Highest education level: Not on file  Occupational History  . Not on file  Tobacco Use  . Smoking status: Former    Current packs/day: 0.00    Average packs/day: 0.5 packs/day for 20.0 years (10.0 ttl pk-yrs)    Types: Cigarettes    Start date: 07/12/1972    Quit date: 07/12/1992    Years since quitting: 30.7  . Smokeless tobacco: Never  Substance and Sexual Activity  . Alcohol use: Yes    Comment: occasionaly  . Drug use: No  . Sexual activity: Yes    Partners: Male  Other Topics Concern  . Not on file  Social History Narrative  . Not on file   Social Determinants of Health   Financial Resource Strain: Low Risk  (09/20/2022)   Overall Financial Resource Strain (CARDIA)   . Difficulty of Paying Living Expenses: Not hard at all  Food Insecurity: No Food Insecurity  (09/20/2022)   Hunger Vital Sign   . Worried About Programme researcher, broadcasting/film/video in the Last Year: Never true   . Ran Out of Food in the Last Year: Never true  Transportation Needs: No Transportation Needs (09/20/2022)   PRAPARE - Transportation   . Lack of Transportation (Medical): No   . Lack of Transportation (Non-Medical): No  Physical Activity: Insufficiently Active (09/20/2022)   Exercise Vital Sign   . Days of Exercise per Week: 3 days   . Minutes of Exercise per Session: 20 min  Stress: No Stress Concern Present (09/20/2022)   [employer] of Occupational Health - Occupational Stress Questionnaire   . Feeling of Stress : Only a little  Social Connections: Moderately Isolated (09/20/2022)   Social Connection and Isolation Panel [NHANES]   . Frequency of Communication with Friends and Family: More than three times a week   . Frequency of Social Gatherings with Friends and Family: Three times a week   . Attends Religious Services: More than 4 times per year   . Active Member of Clubs or Organizations: No   . Attends Banker Meetings: Never   . Marital Status: Divorced  Catering manager Violence: Not At Risk (09/20/2022)   Humiliation, Afraid, Rape, and Kick questionnaire   . Fear of Current or Ex-Partner: No   . Emotionally Abused: No   . Physically Abused: No   . Sexually Abused: No    Outpatient Medications Prior to Visit  Medication Sig Dispense Refill  . acetaminophen (TYLENOL) 500 MG tablet Take 1,000 mg by mouth every 6 (six) hours as needed for headache or mild pain.    Marland Kitchen albuterol (VENTOLIN HFA) 108 (90 Base) MCG/ACT inhaler INHALE 1 - 2 PUFFS INTO THE LUNGS EVERY 4 HOURS AS NEEDED FOR COUGH, WHEEZING, OR CHEST TIGHTNESS 18 g 3  . Ascorbic Acid (VITAMIN C PO) Take 1 tablet by mouth daily.    . Bacillus Coagulans-Inulin (PROBIOTIC) 1-250 BILLION-MG CAPS Take by mouth.    . baclofen (LIORESAL) 20 MG tablet TAKE 1 TABLET(20 MG) BY MOUTH AT BEDTIME AS NEEDED FOR MUSCLE  SPASMS 90 tablet 1  . benzonatate (TESSALON) 100 MG capsule Take 1 capsule (100 mg total) by mouth 3 (three) times daily as needed for cough. 30 capsule 0  . famotidine (PEPCID) 40 MG tablet Take 1 tablet (40 mg total) by mouth at bedtime. 90 tablet 3  . fluticasone (FLONASE) 50 MCG/ACT nasal spray Place 2 sprays into both nostrils daily as needed for allergies. 16 g 1  . potassium chloride SA (  KLOR-CON M) 20 MEQ tablet TAKE 1 TABLET BY MOUTH DAILY. TAKE A 2ND TABLET DAILY AS NEEDED 100 tablet 1  . promethazine (PHENERGAN) 12.5 MG tablet TAKE 1 TABLET(12.5 MG) BY MOUTH EVERY 8 HOURS AS NEEDED FOR NAUSEA OR VOMITING 30 tablet 3  . rosuvastatin (CRESTOR) 40 MG tablet TAKE 1 TABLET(40 MG) BY MOUTH DAILY 90 tablet 1  . torsemide (DEMADEX) 20 MG tablet Take 1 tablet (20 mg total) by mouth daily as needed (fluid). 30 tablet 3  . VITAMIN D PO Take 1 tablet by mouth daily.    Marland Kitchen buPROPion (WELLBUTRIN XL) 150 MG 24 hr tablet Take 150 mg by mouth every morning.    . hydrOXYzine (VISTARIL) 25 MG capsule Take 25 mg by mouth daily.    Marland Kitchen LORazepam (ATIVAN) 0.5 MG tablet Take1 tablet by mouth daily as needed for severe anxiety (this is a 105-month supply) 20 tablet 1  . rOPINIRole (REQUIP) 1 MG tablet Take 1 tablet by mouth at bedtime 60 tablet 0  . traZODone (DESYREL) 50 MG tablet Take 1-2 tablets by mouth at bedtime for sleep as needed 120 tablet 0  . omeprazole (PRILOSEC) 40 MG capsule TAKE 1 CAPSULE (40 MG TOTAL) BY MOUTH DAILY. 90 capsule 1   No facility-administered medications prior to visit.    Allergies  Allergen Reactions  . Elemental Sulfur Diarrhea and Nausea And Vomiting  . Carbamazepine Hives  . Ceclor [Cefaclor] Other (See Comments)    Tongue swell  . Morphine And Codeine Itching  . Atorvastatin Other (See Comments)    Leg pain    Review of Systems  Constitutional:  Positive for malaise/fatigue. Negative for chills and fever.  HENT:  Negative for congestion and hearing loss.   Eyes:   Negative for discharge.  Respiratory:  Negative for cough, sputum production and shortness of breath.   Cardiovascular:  Negative for chest pain, palpitations and leg swelling.  Gastrointestinal:  Negative for abdominal pain, blood in stool, constipation, diarrhea, heartburn, nausea and vomiting.  Genitourinary:  Negative for dysuria, frequency, hematuria and urgency.  Musculoskeletal:  Negative for back pain, falls and myalgias.  Skin:  Positive for itching. Negative for rash.  Neurological:  Negative for dizziness, sensory change, loss of consciousness, weakness and headaches.  Endo/Heme/Allergies:  Negative for environmental allergies. Does not bruise/bleed easily.  Psychiatric/Behavioral:  Positive for depression. Negative for suicidal ideas. The patient is nervous/anxious and has insomnia.       Objective:    Physical Exam Constitutional:      General: She is not in acute distress.    Appearance: Normal appearance. She is not diaphoretic.  HENT:     Head: Normocephalic and atraumatic.     Right Ear: Tympanic membrane, ear canal and external ear normal.     Left Ear: Tympanic membrane, ear canal and external ear normal.     Nose: Nose normal.     Mouth/Throat:     Mouth: Mucous membranes are moist.     Pharynx: Oropharynx is clear. No oropharyngeal exudate.  Eyes:     General: No scleral icterus.       Right eye: No discharge.        Left eye: No discharge.     Conjunctiva/sclera: Conjunctivae normal.     Pupils: Pupils are equal, round, and reactive to light.  Neck:     Thyroid: No thyromegaly.  Cardiovascular:     Rate and Rhythm: Normal rate and regular rhythm.     Heart  sounds: Normal heart sounds. No murmur heard. Pulmonary:     Effort: Pulmonary effort is normal. No respiratory distress.     Breath sounds: Normal breath sounds. No wheezing or rales.  Abdominal:     General: Bowel sounds are normal. There is no distension.     Palpations: Abdomen is soft. There is  no mass.     Tenderness: There is no abdominal tenderness.  Musculoskeletal:        General: No tenderness. Normal range of motion.     Cervical back: Normal range of motion and neck supple.  Lymphadenopathy:     Cervical: No cervical adenopathy.  Skin:    General: Skin is warm and dry.     Findings: No rash.  Neurological:     General: No focal deficit present.     Mental Status: She is alert and oriented to person, place, and time.     Cranial Nerves: No cranial nerve deficit.     Coordination: Coordination normal.     Deep Tendon Reflexes: Reflexes are normal and symmetric. Reflexes normal.  Psychiatric:        Mood and Affect: Mood normal.        Behavior: Behavior normal.        Thought Content: Thought content normal.        Judgment: Judgment normal.   BP 105/62 (BP Location: Left Arm, Patient Position: Sitting, Cuff Size: Normal)   Pulse 64   Temp 98 F (36.7 C) (Oral)   Resp 16   Ht 5\' 5"  (1.651 m)   Wt 192 lb 12.8 oz (87.5 kg)   LMP 10/03/2012   SpO2 95%   BMI 32.08 kg/m  Wt Readings from Last 3 Encounters:  04/05/23 192 lb 12.8 oz (87.5 kg)  01/11/23 198 lb (89.8 kg)  09/07/22 208 lb 6.4 oz (94.5 kg)    Diabetic Foot Exam - Simple   No data filed    Lab Results  Component Value Date   WBC 8.4 01/11/2023   HGB 14.7 01/11/2023   HCT 45.0 01/11/2023   PLT 245.0 01/11/2023   GLUCOSE 79 01/11/2023   CHOL 166 01/11/2023   TRIG 297.0 (H) 01/11/2023   HDL 41.00 01/11/2023   LDLDIRECT 80.0 01/11/2023   LDLCALC 66 01/07/2022   ALT 17 01/11/2023   AST 16 01/11/2023   NA 139 01/11/2023   K 4.1 01/11/2023   CL 101 01/11/2023   CREATININE 0.93 01/11/2023   BUN 18 01/11/2023   CO2 29 01/11/2023   TSH 1.46 01/11/2023   HGBA1C 5.5 01/11/2023    Lab Results  Component Value Date   TSH 1.46 01/11/2023   Lab Results  Component Value Date   WBC 8.4 01/11/2023   HGB 14.7 01/11/2023   HCT 45.0 01/11/2023   MCV 87.2 01/11/2023   PLT 245.0 01/11/2023    Lab Results  Component Value Date   NA 139 01/11/2023   K 4.1 01/11/2023   CO2 29 01/11/2023   GLUCOSE 79 01/11/2023   BUN 18 01/11/2023   CREATININE 0.93 01/11/2023   BILITOT 0.8 01/11/2023   ALKPHOS 53 01/11/2023   AST 16 01/11/2023   ALT 17 01/11/2023   PROT 7.1 01/11/2023   ALBUMIN 4.6 01/11/2023   CALCIUM 10.0 01/11/2023   ANIONGAP 7 05/23/2021   GFR 64.72 01/11/2023   Lab Results  Component Value Date   CHOL 166 01/11/2023   Lab Results  Component Value Date   HDL 41.00 01/11/2023  Lab Results  Component Value Date   LDLCALC 66 01/07/2022   Lab Results  Component Value Date   TRIG 297.0 (H) 01/11/2023   Lab Results  Component Value Date   CHOLHDL 4 01/11/2023   Lab Results  Component Value Date   HGBA1C 5.5 01/11/2023       Assessment & Plan:  Adjustment disorder with mixed anxiety and depressed mood Assessment & Plan: Doing well at present time, no changes to meds   Muscle cramp Assessment & Plan: Hydrate and monitor    Myalgia Assessment & Plan: Hydrate and monitor    Mixed hyperlipidemia Assessment & Plan: Encourage heart healthy diet such as MIND or DASH diet, increase exercise, avoid trans fats, simple carbohydrates and processed foods, consider a krill or fish or flaxseed oil cap daily.     Hyperglycemia Assessment & Plan: hgba1c acceptable, minimize simple carbs. Increase exercise as tolerated.     Preventative health care Assessment & Plan: Patient encouraged to maintain heart healthy diet, regular exercise, adequate sleep. Consider daily probiotics. Take medications as prescribed Labs ordered and reviewed Given and reviewed copy of ACP documents from [facility] Secretary of State and encouraged to complete and return  Colonoscopy 2010, agrees to Cologuard today, ordered Mgm 03/2022 repeat annually, ordered Dexa ordered Pap 2018 repeat this year, [facility], has declined further evaluations due to pain, no Sexual activity in over 20  years.   Breast cancer screening by mammogram -     3D Screening Mammogram, Left and Right; Future  Post-menopausal -     3D Screening Mammogram, Left and Right; Future -     DG Bone Density; Future  Estrogen deficiency -     3D Screening Mammogram, Left and Right; Future -     DG Bone Density; Future  Need for influenza vaccination -     Flu Vaccine Trivalent High Dose (Fluad)  Other orders -     rOPINIRole HCl; Take 1 tablet by mouth at bedtime  Dispense: 90 tablet; Refill: 1 -     LORazepam; Take1 tablet by mouth daily as needed for severe anxiety (this is a 57-month supply)  Dispense: 30 tablet; Refill: 5 -     traZODone HCl; Take 1-2 tablets by mouth at bedtime for sleep as needed  Dispense: 120 tablet; Refill: 1 -     hydrOXYzine Pamoate; Take 1 capsule (25 mg total) by mouth daily.  Dispense: 90 capsule; Refill: 1 -     buPROPion HCl ER (XL); Take 1 tablet (150 mg total) by mouth every morning.  Dispense: 90 tablet; Refill: 1    Assessment and Plan    Restless Leg Syndrome Controlled on Ropinirole 1mg  at bedtime. -Continue Ropinirole 1mg  at bedtime. -Refill prescription for 90-day supply with one refill to [facility] in [place].  Anxiety Controlled on Lorazepam as needed, typically one dose in the afternoon. -Continue Lorazepam as needed. -Refill prescription for 30-day supply due to controlled substance status. -Initiate controlled substance contract and biannual drug testing.  Insomnia Controlled on Trazodone 50mg , occasionally taking a second dose if not asleep within an hour. -Continue Trazodone 50mg  at bedtime, with an additional dose as needed. -Refill prescription for 120 tablets for 90-day supply to account for occasional additional dose.  Pruritus Reports itching, previously controlled on Hydroxyzine. -Resume Hydroxyzine 1 daily, monitor for improvement in itching.  Depression Stable on Wellbutrin 150mg  daily. -Continue Wellbutrin 150mg   daily. -Refill prescription for 90-day supply.  Muscle Spasms Controlled on Baclofen 1 daily. -  Continue Baclofen 1 daily. -Refill prescription for 90-day supply.  General Health Maintenance -Order bone density scan and mammogram. -Recommend Prevnar 20, Shingles, and COVID booster vaccinations. -Order Cologuard for colon cancer screening. -Order basic blood work. -Follow-up in 4 months.         Danise Edge, MD

## 2023-04-05 NOTE — Patient Instructions (Addendum)
[employer] to [address] [employer]  Shingrix is the new shingles shot, 2 shots over 2-6 months, confirm coverage with insurance and document, then can return here for shots with nurse appt or at pharmacy   Prevnar 20 once at pharmacy  Covid booster in the next month   Preventive Care 65 Years and Older, Female Preventive care refers to lifestyle choices and visits with your health care provider that can promote health and wellness. Preventive care visits are also called wellness exams. What can I expect for my preventive care visit? Counseling Your health care provider may ask you questions about your: Medical history, including: Past medical problems. Family medical history. Pregnancy and menstrual history. History of falls. Current health, including: Memory and ability to understand (cognition). Emotional well-being. Home life and relationship well-being. Sexual activity and sexual health. Lifestyle, including: Alcohol, nicotine or tobacco, and drug use. Access to firearms. Diet, exercise, and sleep habits. Work and work Astronomer. Sunscreen use. Safety issues such as seatbelt and bike helmet use. Physical exam Your health care provider will check your: Height and weight. These may be used to calculate your BMI (body mass index). BMI is a measurement that tells if you are at a healthy weight. Waist circumference. This measures the distance around your waistline. This measurement also tells if you are at a healthy weight and may help predict your risk of certain diseases, such as type 2 diabetes and high blood pressure. Heart rate and blood pressure. Body temperature. Skin for abnormal spots. What immunizations do I need?  Vaccines are usually given at various ages, according to a schedule. Your health care provider will recommend vaccines for you based on your age, medical history, and lifestyle or other factors, such as travel or where you work. What tests do I  need? Screening Your health care provider may recommend screening tests for certain conditions. This may include: Lipid and cholesterol levels. Hepatitis C test. Hepatitis B test. HIV (human immunodeficiency virus) test. STI (sexually transmitted infection) testing, if you are at risk. Lung cancer screening. Colorectal cancer screening. Diabetes screening. This is done by checking your blood sugar (glucose) after you have not eaten for a while (fasting). Mammogram. Talk with your health care provider about how often you should have regular mammograms. BRCA-related cancer screening. This may be done if you have a family history of breast, ovarian, tubal, or peritoneal cancers. Bone density scan. This is done to screen for osteoporosis. Talk with your health care provider about your test results, treatment options, and if necessary, the need for more tests. Follow these instructions at home: Eating and drinking  Eat a diet that includes fresh fruits and vegetables, whole grains, lean protein, and low-fat dairy products. Limit your intake of foods with high amounts of sugar, saturated fats, and salt. Take vitamin and mineral supplements as recommended by your health care provider. Do not drink alcohol if your health care provider tells you not to drink. If you drink alcohol: Limit how much you have to 0-1 drink a day. Know how much alcohol is in your drink. In the [place] one drink equals one 12 oz bottle of beer (355 mL), one 5 oz glass of wine (148 mL), or one 1 oz glass of hard liquor (44 mL). Lifestyle Brush your teeth every morning and night with fluoride toothpaste. Floss one time each day. Exercise for at least 30 minutes 5 or more days each week. Do not use any products that contain nicotine or tobacco.  These products include cigarettes, chewing tobacco, and vaping devices, such as e-cigarettes. If you need help quitting, ask your health care provider. Do not use drugs. If you are  sexually active, practice safe sex. Use a condom or other form of protection in order to prevent STIs. Take aspirin only as told by your health care provider. Make sure that you understand how much to take and what form to take. Work with your health care provider to find out whether it is safe and beneficial for you to take aspirin daily. Ask your health care provider if you need to take a cholesterol-lowering medicine (statin). Find healthy ways to manage stress, such as: Meditation, yoga, or listening to music. Journaling. Talking to a trusted person. Spending time with friends and family. Minimize exposure to UV radiation to reduce your risk of skin cancer. Safety Always wear your seat belt while driving or riding in a vehicle. Do not drive: If you have been drinking alcohol. Do not ride with someone who has been drinking. When you are tired or distracted. While texting. If you have been using any mind-altering substances or drugs. Wear a helmet and other protective equipment during sports activities. If you have firearms in your house, make sure you follow all gun safety procedures. What's next? Visit your health care provider once a year for an annual wellness visit. Ask your health care provider how often you should have your eyes and teeth checked. Stay up to date on all vaccines. This information is not intended to replace advice given to you by your health care provider. Make sure you discuss any questions you have with your health care provider. Document Revised: 12/24/2020 Document Reviewed: 12/24/2020 Elsevier Patient Education  2024 [employer].

## 2023-04-12 DIAGNOSIS — H52223 Regular astigmatism, bilateral: Secondary | ICD-10-CM | POA: Diagnosis not present

## 2023-04-15 ENCOUNTER — Other Ambulatory Visit: Payer: Self-pay | Admitting: Family Medicine

## 2023-04-15 DIAGNOSIS — Z1211 Encounter for screening for malignant neoplasm of colon: Secondary | ICD-10-CM

## 2023-04-15 DIAGNOSIS — Z1212 Encounter for screening for malignant neoplasm of rectum: Secondary | ICD-10-CM

## 2023-04-18 ENCOUNTER — Ambulatory Visit (HOSPITAL_BASED_OUTPATIENT_CLINIC_OR_DEPARTMENT_OTHER)
Admission: RE | Admit: 2023-04-18 | Discharge: 2023-04-18 | Disposition: A | Discharge status: Home / Self Care | Payer: Medicare Other | Source: Ambulatory Visit | Attending: Family Medicine | Admitting: Family Medicine

## 2023-04-18 ENCOUNTER — Encounter (HOSPITAL_BASED_OUTPATIENT_CLINIC_OR_DEPARTMENT_OTHER): Payer: Self-pay

## 2023-04-18 DIAGNOSIS — E2839 Other primary ovarian failure: Secondary | ICD-10-CM | POA: Insufficient documentation

## 2023-04-18 DIAGNOSIS — Z1231 Encounter for screening mammogram for malignant neoplasm of breast: Secondary | ICD-10-CM | POA: Diagnosis not present

## 2023-04-18 DIAGNOSIS — Z780 Asymptomatic menopausal state: Secondary | ICD-10-CM | POA: Insufficient documentation

## 2023-04-18 DIAGNOSIS — M85851 Other specified disorders of bone density and structure, right thigh: Secondary | ICD-10-CM | POA: Diagnosis not present

## 2023-05-06 ENCOUNTER — Encounter: Payer: Self-pay | Admitting: Physician Assistant

## 2023-05-06 ENCOUNTER — Ambulatory Visit (INDEPENDENT_AMBULATORY_CARE_PROVIDER_SITE_OTHER): Payer: Medicare Other | Admitting: Physician Assistant

## 2023-05-06 ENCOUNTER — Other Ambulatory Visit (HOSPITAL_BASED_OUTPATIENT_CLINIC_OR_DEPARTMENT_OTHER): Payer: Self-pay

## 2023-05-06 VITALS — BP 92/58 | HR 64 | Temp 97.8°F | Ht 65.0 in | Wt 192.2 lb

## 2023-05-06 DIAGNOSIS — J029 Acute pharyngitis, unspecified: Secondary | ICD-10-CM

## 2023-05-06 DIAGNOSIS — J020 Streptococcal pharyngitis: Secondary | ICD-10-CM

## 2023-05-06 DIAGNOSIS — R6889 Other general symptoms and signs: Secondary | ICD-10-CM

## 2023-05-06 DIAGNOSIS — I959 Hypotension, unspecified: Secondary | ICD-10-CM | POA: Diagnosis not present

## 2023-05-06 LAB — POCT INFLUENZA A/B
Influenza A, POC: NEGATIVE
Influenza B, POC: NEGATIVE

## 2023-05-06 LAB — POC COVID19 BINAXNOW: SARS Coronavirus 2 Ag: NEGATIVE

## 2023-05-06 LAB — POCT RAPID STREP A (OFFICE): Rapid Strep A Screen: POSITIVE — AB

## 2023-05-06 MED ORDER — AMOXICILLIN 500 MG PO CAPS
500.0000 mg | ORAL_CAPSULE | Freq: Two times a day (BID) | ORAL | 0 refills | Status: AC
Start: 2023-05-06 — End: 2023-05-16
  Filled 2023-05-06: qty 20, 10d supply, fill #0

## 2023-05-06 NOTE — Progress Notes (Signed)
Established patient visit   Patient: [name]   DOB: January 19, 1956   67 y.o. Female  MRN: [id number] Visit Date: 05/06/2023  Today's healthcare provider: Alfredia Ferguson, PA-C   Chief Complaint  Patient presents with   Flu like symptoms    Drainage and cough has been here for weeks- Sore throat is painful she cannot swallow. Head aches, night sweats, Body aches, no fever that she knows of. No at home test done.   Subjective     Discussed the use of AI scribe software for clinical note transcription with the patient, who gave verbal consent to proceed.  History of Present Illness   The patient presents with a one-week history of worsening symptoms including congestion, drainage, a severe sore throat, and coughing. The cough is described as dry, occasionally causing a choking sensation. The patient also reports a headache. The patient's symptoms have been progressively worsening.       Medications: Outpatient Medications Prior to Visit  Medication Sig   acetaminophen (TYLENOL) 500 MG tablet Take 1,000 mg by mouth every 6 (six) hours as needed for headache or mild pain.   albuterol (VENTOLIN HFA) 108 (90 Base) MCG/ACT inhaler INHALE 1 - 2 PUFFS INTO THE LUNGS EVERY 4 HOURS AS NEEDED FOR COUGH, WHEEZING, OR CHEST TIGHTNESS   Ascorbic Acid (VITAMIN C PO) Take 1 tablet by mouth daily.   Bacillus Coagulans-Inulin (PROBIOTIC) 1-250 BILLION-MG CAPS Take by mouth.   baclofen (LIORESAL) 20 MG tablet TAKE 1 TABLET(20 MG) BY MOUTH AT BEDTIME AS NEEDED FOR MUSCLE SPASMS   benzonatate (TESSALON) 100 MG capsule Take 1 capsule (100 mg total) by mouth 3 (three) times daily as needed for cough.   buPROPion (WELLBUTRIN XL) 150 MG 24 hr tablet Take 1 tablet (150 mg total) by mouth every morning.   famotidine (PEPCID) 40 MG tablet Take 1 tablet (40 mg total) by mouth at bedtime.   fluticasone (FLONASE) 50 MCG/ACT nasal spray Place 2 sprays into both nostrils daily as needed for allergies.    hydrOXYzine (VISTARIL) 25 MG capsule Take 1 capsule (25 mg total) by mouth daily.   LORazepam (ATIVAN) 0.5 MG tablet Take1 tablet by mouth daily as needed for severe anxiety (this is a 71-month supply)   potassium chloride SA (KLOR-CON M) 20 MEQ tablet TAKE 1 TABLET BY MOUTH DAILY. TAKE A 2ND TABLET DAILY AS NEEDED   promethazine (PHENERGAN) 12.5 MG tablet TAKE 1 TABLET(12.5 MG) BY MOUTH EVERY 8 HOURS AS NEEDED FOR NAUSEA OR VOMITING   rOPINIRole (REQUIP) 1 MG tablet Take 1 tablet by mouth at bedtime   rosuvastatin (CRESTOR) 40 MG tablet TAKE 1 TABLET(40 MG) BY MOUTH DAILY   torsemide (DEMADEX) 20 MG tablet Take 1 tablet (20 mg total) by mouth daily as needed (fluid).   traZODone (DESYREL) 50 MG tablet Take 1-2 tablets by mouth at bedtime for sleep as needed   VITAMIN D PO Take 1 tablet by mouth daily.   omeprazole (PRILOSEC) 40 MG capsule TAKE 1 CAPSULE (40 MG TOTAL) BY MOUTH DAILY.   No facility-administered medications prior to visit.    Review of Systems  Constitutional:  Positive for fatigue. Negative for fever.  HENT:  Positive for congestion, sinus pressure and sore throat.   Respiratory:  Positive for cough. Negative for shortness of breath.   Cardiovascular:  Negative for chest pain and leg swelling.  Gastrointestinal:  Negative for abdominal pain.  Neurological:  Positive for headaches. Negative for dizziness.  Objective    BP (!) 92/58   Pulse 64   Temp 97.8 F (36.6 C)   Ht 5\' 5"  (1.651 m)   Wt 192 lb 4 oz (87.2 kg)   LMP 10/03/2012   SpO2 97%   BMI 31.99 kg/m    Physical Exam Constitutional:      General: She is awake.     Appearance: She is well-developed.  HENT:     Head: Normocephalic.     Right Ear: Tympanic membrane normal.     Left Ear: Tympanic membrane normal.     Mouth/Throat:     Pharynx: Posterior oropharyngeal erythema present. No oropharyngeal exudate.  Eyes:     Conjunctiva/sclera: Conjunctivae normal.  Cardiovascular:     Rate and  Rhythm: Normal rate and regular rhythm.     Heart sounds: Normal heart sounds.  Pulmonary:     Effort: Pulmonary effort is normal.     Breath sounds: Normal breath sounds.  Skin:    General: Skin is warm.  Neurological:     Mental Status: She is alert and oriented to person, place, and time.  Psychiatric:        Attention and Perception: Attention normal.        Mood and Affect: Mood normal.        Speech: Speech normal.        Behavior: Behavior is cooperative.      Results for orders placed or performed in visit on 05/06/23  POCT Influenza A/B  Result Value Ref Range   Influenza A, POC Negative Negative   Influenza B, POC Negative Negative  POC COVID-19  Result Value Ref Range   SARS Coronavirus 2 Ag Negative Negative  POCT rapid strep A  Result Value Ref Range   Rapid Strep A Screen Positive (A) Negative    Assessment & Plan    1. Strep pharyngitis  -Start Amoxicillin 500mg  twice daily for 10 days. -Advise to start over-the-counter Mucinex to help with cough and congestion. -Encourage increased fluid intake for hydration and symptom relief.  - amoxicillin (AMOXIL) 500 MG capsule; Take 1 capsule (500 mg total) by mouth 2 (two) times daily for 10 days.  Dispense: 20 capsule; Refill: 0  2. Hypotension, unspecified hypotension type -Increase fluid intake.   3. Flu-like symptoms - POCT Influenza A/B - POC COVID-19  4. Sore throat - POCT rapid strep A   Return if symptoms worsen or fail to improve.       Alfredia Ferguson, PA-C  [facility] at [facility] [phone] (phone) [phone] (fax)  [facility]

## 2023-05-13 ENCOUNTER — Other Ambulatory Visit (HOSPITAL_BASED_OUTPATIENT_CLINIC_OR_DEPARTMENT_OTHER): Payer: Self-pay

## 2023-05-13 ENCOUNTER — Telehealth: Payer: Self-pay | Admitting: Family Medicine

## 2023-05-13 ENCOUNTER — Other Ambulatory Visit: Payer: Self-pay

## 2023-05-13 ENCOUNTER — Other Ambulatory Visit: Payer: Self-pay | Admitting: Physician Assistant

## 2023-05-13 DIAGNOSIS — R6889 Other general symptoms and signs: Secondary | ICD-10-CM

## 2023-05-13 MED ORDER — PREDNISONE 20 MG PO TABS
20.0000 mg | ORAL_TABLET | Freq: Every day | ORAL | 0 refills | Status: DC
Start: 2023-05-13 — End: 2023-05-13
  Filled 2023-05-13: qty 5, 5d supply, fill #0

## 2023-05-13 MED ORDER — PREDNISONE 20 MG PO TABS: 20.0000 mg | ORAL_TABLET | Freq: Every day | ORAL | 0 refills | Status: DC

## 2023-05-13 NOTE — Telephone Encounter (Signed)
Pt called and asked if Lillia Abed could send in a prescription for Prednisone for the symptoms she's having since she saw Lillia Abed on 10/25. Please call and advise.

## 2023-05-13 NOTE — Telephone Encounter (Signed)
Called and let patient know  Patient wanted it sent to [facility] in [place], so I switched it

## 2023-05-24 ENCOUNTER — Other Ambulatory Visit: Payer: Self-pay | Admitting: Family Medicine

## 2023-05-24 DIAGNOSIS — Z1211 Encounter for screening for malignant neoplasm of colon: Secondary | ICD-10-CM | POA: Diagnosis not present

## 2023-05-24 DIAGNOSIS — Z1212 Encounter for screening for malignant neoplasm of rectum: Secondary | ICD-10-CM | POA: Diagnosis not present

## 2023-05-24 NOTE — Telephone Encounter (Signed)
Refill sent to last until next appointment. 

## 2023-06-01 LAB — COLOGUARD: COLOGUARD: NEGATIVE

## 2023-06-03 ENCOUNTER — Encounter: Payer: Self-pay | Admitting: *Deleted

## 2023-06-29 ENCOUNTER — Other Ambulatory Visit: Payer: Self-pay | Admitting: Family Medicine

## 2023-07-19 ENCOUNTER — Ambulatory Visit: Payer: Medicare Other | Admitting: Family Medicine

## 2023-07-25 ENCOUNTER — Other Ambulatory Visit: Payer: Self-pay | Admitting: Family Medicine

## 2023-07-25 DIAGNOSIS — R252 Cramp and spasm: Secondary | ICD-10-CM

## 2023-07-31 NOTE — Assessment & Plan Note (Signed)
hgba1c acceptable, minimize simple carbs. Increase exercise as tolerated.  

## 2023-07-31 NOTE — Assessment & Plan Note (Signed)
Hydrate and monitor 

## 2023-07-31 NOTE — Assessment & Plan Note (Signed)
Encouraged good sleep hygiene such as dark, quiet room. No blue/green glowing lights such as computer screens in bedroom. No alcohol or stimulants in evening. Cut down on caffeine as able. Regular exercise is helpful but not just prior to bed time.  

## 2023-07-31 NOTE — Assessment & Plan Note (Signed)
Doing well at present time, no changes to meds

## 2023-07-31 NOTE — Assessment & Plan Note (Signed)
Encourage heart healthy diet such as MIND or DASH diet, increase exercise, avoid trans fats, simple carbohydrates and processed foods, consider a krill or fish or flaxseed oil cap daily.  °

## 2023-08-02 ENCOUNTER — Telehealth (INDEPENDENT_AMBULATORY_CARE_PROVIDER_SITE_OTHER): Payer: Medicare Other | Admitting: Family Medicine

## 2023-08-02 ENCOUNTER — Encounter: Payer: Self-pay | Admitting: Family Medicine

## 2023-08-02 VITALS — Ht 65.0 in

## 2023-08-02 DIAGNOSIS — R739 Hyperglycemia, unspecified: Secondary | ICD-10-CM

## 2023-08-02 DIAGNOSIS — R252 Cramp and spasm: Secondary | ICD-10-CM

## 2023-08-02 DIAGNOSIS — E782 Mixed hyperlipidemia: Secondary | ICD-10-CM | POA: Diagnosis not present

## 2023-08-02 DIAGNOSIS — F4323 Adjustment disorder with mixed anxiety and depressed mood: Secondary | ICD-10-CM

## 2023-08-02 DIAGNOSIS — G4700 Insomnia, unspecified: Secondary | ICD-10-CM

## 2023-08-02 MED ORDER — PROMETHAZINE HCL 12.5 MG PO TABS: ORAL_TABLET | ORAL | 5 refills | Status: AC

## 2023-08-02 MED ORDER — BACLOFEN 20 MG PO TABS: 20.0000 mg | ORAL_TABLET | Freq: Every evening | ORAL | 1 refills | Status: DC | PRN

## 2023-08-02 MED ORDER — TORSEMIDE 20 MG PO TABS: 20.0000 mg | ORAL_TABLET | Freq: Every day | ORAL | 1 refills | Status: DC | PRN

## 2023-08-02 MED ORDER — ROPINIROLE HCL 1 MG PO TABS: ORAL_TABLET | ORAL | 1 refills | Status: DC

## 2023-08-02 MED ORDER — FAMOTIDINE 40 MG PO TABS: 40.0000 mg | ORAL_TABLET | Freq: Every day | ORAL | 3 refills | Status: DC

## 2023-08-02 MED ORDER — BUPROPION HCL ER (XL) 150 MG PO TB24: 150.0000 mg | ORAL_TABLET | Freq: Every morning | ORAL | 1 refills | Status: DC

## 2023-08-02 MED ORDER — TRAZODONE HCL 50 MG PO TABS: ORAL_TABLET | ORAL | 1 refills | Status: DC

## 2023-08-02 MED ORDER — BUSPIRONE HCL 7.5 MG PO TABS: 7.5000 mg | ORAL_TABLET | Freq: Two times a day (BID) | ORAL | 2 refills | Status: DC

## 2023-08-02 MED ORDER — HYDROXYZINE PAMOATE 25 MG PO CAPS: 25.0000 mg | ORAL_CAPSULE | Freq: Every day | ORAL | 1 refills | Status: AC

## 2023-08-02 NOTE — Progress Notes (Signed)
MyChart Video Visit    Virtual Visit via Video Note   This patient is at least at moderate risk for complications without adequate follow up. This format is felt to be most appropriate for this patient at this time. Physical exam was limited by quality of the video and audio technology used for the visit. Juanetta, CMA was able to get the patient set up on a video visit.  Patient location: Home Patient and provider in visit Provider location: Office  I discussed the limitations of evaluation and management by telemedicine and the availability of in person appointments. The patient expressed understanding and agreed to proceed.  Visit Date: 08/02/2023  Today's healthcare provider: Danise Edge, MD     Subjective:    Patient ID: [name], female    DOB: [birth year], 68 y.o.   MRN: [id number]  Chief Complaint  Patient presents with   Follow-up    4 month    HPI Discussed the use of AI scribe software for clinical note transcription with the patient, who gave verbal consent to proceed.  History of Present Illness   The patient, with a history of anxiety and depression, presents with increased anxiety symptoms, particularly in the afternoon and in social situations. They report that their current medication regimen, which includes nightly lorazepam and bupropion for depression, is not adequately managing their anxiety symptoms. The patient describes a recent social situation at church that caused significant distress and preoccupation. They also report that their anxiety symptoms tend to peak in the afternoon. The patient is currently taking lorazepam at night and bupropion for depression. They have found the bupropion to be particularly helpful for their depression and have expressed a desire to continue this medication.        Past Medical History:  Diagnosis Date   Acute sinusitis 03/27/2013   Allergy    seasonal   Anxiety    Arthritis 06/09/2013   Depression     Dyspareunia    Edema extremities    Encounter for preventative adult health care exam with abnormal findings 04/21/2014   GERD (gastroesophageal reflux disease)    Hip pain, left 11/01/2011   History of proteinuria syndrome    Hyperlipidemia    Insomnia 03/11/2013   Low back pain 05/09/2011   Lymphadenitis 06/16/2012   Myalgia and myositis 02/15/2015   Obese    Onychomycosis 05/09/2011   Oral lesion 08/26/2013   Otitis externa 06/23/2012   left   Perimenopause 03/11/2013   RLS (restless legs syndrome)    RUQ pain 09/20/2011   Sleep apnea 04/21/2014   Tinea corporis 04/26/2017   Unspecified constipation 05/13/2013   Urinary frequency 08/26/2013    Past Surgical History:  Procedure Laterality Date   BACK SURGERY  2006   low, discectomy for herniated disc   BLADDER SUSPENSION N/A 11/07/2012   Procedure: [facility] SLING;  Surgeon: Anner Crete, MD;  [facility]  Service: Urology;  Laterality: N/A;   CARPAL TUNNEL RELEASE Right 2006   CESAREAN SECTION  1993   CYSTOSCOPY N/A 11/07/2012   Procedure: CYSTOSCOPY;  Surgeon: Anner Crete, MD;  Location: [facility];  Service: Urology;  Laterality: N/A;   TUBAL LIGATION  1993    Family History  Problem Relation Age of Onset   Cancer Mother 5       thyroid, malignant brain tumor   Arthritis Mother        neck, back   Arthritis Father  back pain   Heart disease Father        stent   Hepatitis Sister        Hepatitis C   Heart disease Sister        stent   Breast cancer Maternal Aunt    Colon polyps Maternal Grandmother    Diabetes Maternal Grandmother    Colon cancer Neg Hx    Rectal cancer Neg Hx    Stomach cancer Neg Hx     Social History   Socioeconomic History   Marital status: Divorced    Spouse name: Not on file   Number of children: Not on file   Years of education: Not on file   Highest education level: Not on file  Occupational History   Not on file  Tobacco  Use   Smoking status: Former    Current packs/day: 0.00    Average packs/day: 0.5 packs/day for 20.0 years (10.0 ttl pk-yrs)    Types: Cigarettes    Start date: 07/12/1972    Quit date: 07/12/1992    Years since quitting: 31.0   Smokeless tobacco: Never  Substance and Sexual Activity   Alcohol use: Yes    Comment: occasionaly   Drug use: No   Sexual activity: Yes    Partners: Male  Other Topics Concern   Not on file  Social History Narrative   Not on file   Social Drivers of Health   Financial Resource Strain: Low Risk  (09/20/2022)   Overall Financial Resource Strain (CARDIA)    Difficulty of Paying Living Expenses: Not hard at all  Food Insecurity: No Food Insecurity (09/20/2022)   Hunger Vital Sign    Worried About Running Out of Food in the Last Year: Never true    Ran Out of Food in the Last Year: Never true  Transportation Needs: No Transportation Needs (09/20/2022)   PRAPARE - Administrator, Civil Service (Medical): No    Lack of Transportation (Non-Medical): No  Physical Activity: Insufficiently Active (09/20/2022)   Exercise Vital Sign    Days of Exercise per Week: 3 days    Minutes of Exercise per Session: 20 min  Stress: No Stress Concern Present (09/20/2022)   [employer] of Occupational Health - Occupational Stress Questionnaire    Feeling of Stress : Only a little  Social Connections: Moderately Isolated (09/20/2022)   Social Connection and Isolation Panel [NHANES]    Frequency of Communication with Friends and Family: More than three times a week    Frequency of Social Gatherings with Friends and Family: Three times a week    Attends Religious Services: More than 4 times per year    Active Member of Clubs or Organizations: No    Attends Banker Meetings: Never    Marital Status: Divorced  Catering manager Violence: Not At Risk (09/20/2022)   Humiliation, Afraid, Rape, and Kick questionnaire    Fear of Current or Ex-Partner: No     Emotionally Abused: No    Physically Abused: No    Sexually Abused: No    Outpatient Medications Prior to Visit  Medication Sig Dispense Refill   acetaminophen (TYLENOL) 500 MG tablet Take 1,000 mg by mouth every 6 (six) hours as needed for headache or mild pain.     Ascorbic Acid (VITAMIN C PO) Take 1 tablet by mouth daily.     Bacillus Coagulans-Inulin (PROBIOTIC) 1-250 BILLION-MG CAPS Take by mouth.     benzonatate (TESSALON) 100 MG  capsule Take 1 capsule (100 mg total) by mouth 3 (three) times daily as needed for cough. 30 capsule 0   fluticasone (FLONASE) 50 MCG/ACT nasal spray Place 2 sprays into both nostrils daily as needed for allergies. 16 g 1   LORazepam (ATIVAN) 0.5 MG tablet Take1 tablet by mouth daily as needed for severe anxiety (this is a 79-month supply) 30 tablet 5   potassium chloride SA (KLOR-CON M) 20 MEQ tablet TAKE 1 TABLET BY MOUTH DAILY. TAKE SECOND TABLET DAILY AS NEEDED 180 tablet 0   predniSONE (DELTASONE) 20 MG tablet Take 1 tablet (20 mg total) by mouth daily with breakfast. 5 tablet 0   rosuvastatin (CRESTOR) 40 MG tablet Take 1 tablet (40 mg total) by mouth daily. 90 tablet 0   VITAMIN D PO Take 1 tablet by mouth daily.     baclofen (LIORESAL) 20 MG tablet TAKE 1 TABLET(20 MG) BY MOUTH AT BEDTIME AS NEEDED FOR MUSCLE SPASMS 90 tablet 1   buPROPion (WELLBUTRIN XL) 150 MG 24 hr tablet Take 1 tablet (150 mg total) by mouth every morning. 90 tablet 1   famotidine (PEPCID) 40 MG tablet Take 1 tablet (40 mg total) by mouth at bedtime. 90 tablet 3   hydrOXYzine (VISTARIL) 25 MG capsule Take 1 capsule (25 mg total) by mouth daily. 90 capsule 1   promethazine (PHENERGAN) 12.5 MG tablet TAKE 1 TABLET(12.5 MG) BY MOUTH EVERY 8 HOURS AS NEEDED FOR NAUSEA OR VOMITING 30 tablet 3   rOPINIRole (REQUIP) 1 MG tablet Take 1 tablet by mouth at bedtime 90 tablet 1   torsemide (DEMADEX) 20 MG tablet TAKE 1 TABLET(20 MG) BY MOUTH DAILY FLUID AS NEEDED 90 tablet 0   traZODone  (DESYREL) 50 MG tablet Take 1-2 tablets by mouth at bedtime for sleep as needed 120 tablet 1   albuterol (VENTOLIN HFA) 108 (90 Base) MCG/ACT inhaler INHALE 1 - 2 PUFFS INTO THE LUNGS EVERY 4 HOURS AS NEEDED FOR COUGH, WHEEZING, OR CHEST TIGHTNESS 18 g 3   omeprazole (PRILOSEC) 40 MG capsule TAKE 1 CAPSULE (40 MG TOTAL) BY MOUTH DAILY. 90 capsule 1   No facility-administered medications prior to visit.    Allergies  Allergen Reactions   Elemental Sulfur Diarrhea and Nausea And Vomiting   Carbamazepine Hives   Ceclor [Cefaclor] Other (See Comments)    Tongue swell   Morphine And Codeine Itching   Atorvastatin Other (See Comments)    Leg pain    Review of Systems  Constitutional:  Positive for malaise/fatigue. Negative for fever.  HENT:  Negative for congestion.   Eyes:  Negative for blurred vision.  Respiratory:  Negative for shortness of breath.   Cardiovascular:  Negative for chest pain, palpitations and leg swelling.  Gastrointestinal:  Negative for abdominal pain, blood in stool and nausea.  Genitourinary:  Negative for dysuria and frequency.  Musculoskeletal:  Negative for falls.  Skin:  Negative for rash.  Neurological:  Negative for dizziness, loss of consciousness and headaches.  Endo/Heme/Allergies:  Negative for environmental allergies.  Psychiatric/Behavioral:  Positive for depression. The patient is nervous/anxious.       Objective:    Physical Exam Constitutional:      General: She is not in acute distress.    Appearance: Normal appearance. She is not ill-appearing or toxic-appearing.  HENT:     Head: Normocephalic and atraumatic.     Right Ear: External ear normal.     Left Ear: External ear normal.  Nose: Nose normal.  Eyes:     General:        Right eye: No discharge.        Left eye: No discharge.  Pulmonary:     Effort: Pulmonary effort is normal.  Skin:    Findings: No rash.  Neurological:     Mental Status: She is alert and oriented to  person, place, and time.  Psychiatric:        Behavior: Behavior normal.   Ht 5\' 5"  (1.651 m)   LMP 10/03/2012   BMI 31.99 kg/m  Wt Readings from Last 3 Encounters:  05/06/23 192 lb 4 oz (87.2 kg)  04/05/23 192 lb 12.8 oz (87.5 kg)  01/11/23 198 lb (89.8 kg)       Assessment & Plan:  Adjustment disorder with mixed anxiety and depressed mood Assessment & Plan: Doing well at present time, no changes to meds  Orders: -     CBC with Differential/Platelet; Future  Hyperglycemia Assessment & Plan: hgba1c acceptable, minimize simple carbs. Increase exercise as tolerated.    Orders: -     CBC with Differential/Platelet; Future -     Hemoglobin A1c; Future -     TSH; Future  Mixed hyperlipidemia Assessment & Plan: Encourage heart healthy diet such as MIND or DASH diet, increase exercise, avoid trans fats, simple carbohydrates and processed foods, consider a krill or fish or flaxseed oil cap daily.    Orders: -     Lipid panel; Future -     Comprehensive metabolic panel; Future -     TSH; Future  Muscle cramp Assessment & Plan: Hydrate and monitor   Orders: -     Comprehensive metabolic panel; Future -     CBC with Differential/Platelet; Future -     Magnesium; Future -     Baclofen; Take 1 tablet (20 mg total) by mouth at bedtime as needed for muscle spasms.  Dispense: 90 tablet; Refill: 1  Insomnia, unspecified type Assessment & Plan: Encouraged good sleep hygiene such as dark, quiet room. No blue/green glowing lights such as computer screens in bedroom. No alcohol or stimulants in evening. Cut down on caffeine as able. Regular exercise is helpful but not just prior to bed time.    Orders: -     CBC with Differential/Platelet; Future -     TSH; Future  Other orders -     busPIRone HCl; Take 1 tablet (7.5 mg total) by mouth 2 (two) times daily.  Dispense: 60 tablet; Refill: 2 -     Famotidine; Take 1 tablet (40 mg total) by mouth at bedtime.  Dispense: 90 tablet;  Refill: 3 -     Promethazine HCl; TAKE 1 TABLET(12.5 MG) BY MOUTH EVERY 8 HOURS AS NEEDED FOR NAUSEA OR VOMITING  Dispense: 30 tablet; Refill: 5 -     traZODone HCl; Take 1-2 tablets by mouth at bedtime for sleep as needed  Dispense: 120 tablet; Refill: 1 -     hydrOXYzine Pamoate; Take 1 capsule (25 mg total) by mouth daily.  Dispense: 90 capsule; Refill: 1 -     buPROPion HCl ER (XL); Take 1 tablet (150 mg total) by mouth every morning.  Dispense: 90 tablet; Refill: 1 -     rOPINIRole HCl; Take 1 tablet by mouth at bedtime  Dispense: 90 tablet; Refill: 1 -     Torsemide; Take 1 tablet (20 mg total) by mouth daily as needed.  Dispense: 90 tablet; Refill: 1  Assessment and Plan    Anxiety Increased afternoon anxiety symptoms despite nightly Lorazepam. Social situations exacerbate symptoms. Discussed the risks/benefits of adding an afternoon dose of Lorazepam (risk of sedation and driving impairment, potential increased risk of dementia) vs starting Buspar (potential for better anxiety control, no weight gain, well tolerated). -Start Buspar 7.5mg  twice daily. -Continue current regimen of Lorazepam 1mg  at night and Bupropion for depression. -Follow up in 6-8 weeks to assess efficacy and tolerability of Buspar.  Medication Management Several medications require prior authorization and refills. -Initiate prior authorization process for Hydroxyzine, Lorazepam, and Promethazine. -Refill Trazodone, Bupropion, Baclofen, Ropinirole, and Torsemide prescriptions. -Refill Famotidine as it has not been filled in over a year.  General Health Maintenance -Order lab work to check magnesium, sugar, and kidney function as it has not been done since July. -Schedule follow-up appointment in three months to review medication changes and lab results. -Schedule annual physical exam for after September 24th.         I discussed the assessment and treatment plan with the patient. The patient was provided  an opportunity to ask questions and all were answered. The patient agreed with the plan and demonstrated an understanding of the instructions.   The patient was advised to call back or seek an in-person evaluation if the symptoms worsen or if the condition fails to improve as anticipated.  Danise Edge, MD [facility] at [facility] [phone] (phone) [phone] (fax)  [facility]

## 2023-08-03 ENCOUNTER — Telehealth: Payer: Self-pay

## 2023-08-03 ENCOUNTER — Other Ambulatory Visit (HOSPITAL_COMMUNITY): Payer: Self-pay

## 2023-08-03 NOTE — Telephone Encounter (Signed)
Pharmacy Patient Advocate Encounter   Received notification from  [facility] Portal that prior authorization for hydrOXYzine Pamoate 25MG  capsules is required/requested.   Insurance verification completed.   The patient is insured through [facility] .   Per test claim: PA required; PA submitted to above mentioned insurance via CoverMyMeds Key/confirmation #/[id number] Status is pending

## 2023-08-04 ENCOUNTER — Other Ambulatory Visit (HOSPITAL_COMMUNITY): Payer: Self-pay

## 2023-08-04 NOTE — Telephone Encounter (Signed)
Copied from CRM [id number]. Topic: General - Other >> Aug 04, 2023 10:02 AM Fredrich Romans wrote: Reason for CRM: bcbs called in to report PA approval of medication   hydrOXYzine Pamoate 25MG  capsules. He said that he has already informed the patient and he will be faxing information over to the office as well

## 2023-08-17 ENCOUNTER — Other Ambulatory Visit (INDEPENDENT_AMBULATORY_CARE_PROVIDER_SITE_OTHER): Payer: Medicare Other

## 2023-08-17 ENCOUNTER — Encounter: Payer: Self-pay | Admitting: Family Medicine

## 2023-08-17 DIAGNOSIS — R252 Cramp and spasm: Secondary | ICD-10-CM

## 2023-08-17 DIAGNOSIS — R739 Hyperglycemia, unspecified: Secondary | ICD-10-CM | POA: Diagnosis not present

## 2023-08-17 DIAGNOSIS — E782 Mixed hyperlipidemia: Secondary | ICD-10-CM

## 2023-08-17 DIAGNOSIS — G4700 Insomnia, unspecified: Secondary | ICD-10-CM | POA: Diagnosis not present

## 2023-08-17 DIAGNOSIS — F4323 Adjustment disorder with mixed anxiety and depressed mood: Secondary | ICD-10-CM | POA: Diagnosis not present

## 2023-08-17 LAB — COMPREHENSIVE METABOLIC PANEL
ALT: 11 U/L (ref 0–35)
AST: 13 U/L (ref 0–37)
Albumin: 4.2 g/dL (ref 3.5–5.2)
Alkaline Phosphatase: 57 U/L (ref 39–117)
BUN: 12 mg/dL (ref 6–23)
CO2: 27 meq/L (ref 19–32)
Calcium: 8.9 mg/dL (ref 8.4–10.5)
Chloride: 105 meq/L (ref 96–112)
Creatinine, Ser: 0.71 mg/dL (ref 0.40–1.20)
GFR: 89.1 mL/min (ref >60.00)
Glucose, Bld: 75 mg/dL (ref 70–99)
Potassium: 4.5 meq/L (ref 3.5–5.1)
Sodium: 139 meq/L (ref 135–145)
Total Bilirubin: 0.6 mg/dL (ref 0.2–1.2)
Total Protein: 6.2 g/dL (ref 6.0–8.3)

## 2023-08-17 LAB — TSH: TSH: 1.29 u[IU]/mL (ref 0.35–5.50)

## 2023-08-17 LAB — CBC WITH DIFFERENTIAL/PLATELET
Basophils Absolute: 0 10*3/uL (ref 0.0–0.1)
Basophils Relative: 0.8 % (ref 0.0–3.0)
Eosinophils Absolute: 0.1 10*3/uL (ref 0.0–0.7)
Eosinophils Relative: 2.4 % (ref 0.0–5.0)
HCT: 42.3 % (ref 36.0–46.0)
Hemoglobin: 13.8 g/dL (ref 12.0–15.0)
Lymphocytes Relative: 44 % (ref 12.0–46.0)
Lymphs Abs: 2.3 10*3/uL (ref 0.7–4.0)
MCHC: 32.6 g/dL (ref 30.0–36.0)
MCV: 86.8 fL (ref 78.0–100.0)
Monocytes Absolute: 0.4 10*3/uL (ref 0.1–1.0)
Monocytes Relative: 6.8 % (ref 3.0–12.0)
Neutro Abs: 2.4 10*3/uL (ref 1.4–7.7)
Neutrophils Relative %: 46 % (ref 43.0–77.0)
Platelets: 222 10*3/uL (ref 150.0–400.0)
RBC: 4.88 Mil/uL (ref 3.87–5.11)
RDW: 13.9 % (ref 11.5–15.5)
WBC: 5.2 10*3/uL (ref 4.0–10.5)

## 2023-08-17 LAB — LIPID PANEL
Cholesterol: 263 mg/dL — ABNORMAL HIGH (ref 0–200)
HDL: 42.9 mg/dL (ref >39.00)
LDL Cholesterol: 188 mg/dL — ABNORMAL HIGH (ref 0–99)
NonHDL: 219.69
Total CHOL/HDL Ratio: 6
Triglycerides: 157 mg/dL — ABNORMAL HIGH (ref 0.0–149.0)
VLDL: 31.4 mg/dL (ref 0.0–40.0)

## 2023-08-17 LAB — MAGNESIUM: Magnesium: 2.1 mg/dL (ref 1.5–2.5)

## 2023-08-17 LAB — HEMOGLOBIN A1C: Hgb A1c MFr Bld: 5.6 % (ref 4.6–6.5)

## 2023-09-26 ENCOUNTER — Telehealth: Payer: Self-pay

## 2023-09-26 NOTE — Telephone Encounter (Signed)
 Copied from CRM [id number]. Topic: General - Other >> Sep 26, 2023 10:39 AM Rodman Pickle T wrote: Reason for CRM: patient is calling in regarding her appt she has to have her appt scheduled before the end of march the appt I scheduled for her is In sept she stated that's to far out

## 2023-10-03 DIAGNOSIS — K080 Exfoliation of teeth due to systemic causes: Secondary | ICD-10-CM | POA: Diagnosis not present

## 2023-10-14 ENCOUNTER — Ambulatory Visit: Admitting: *Deleted

## 2023-10-14 DIAGNOSIS — Z0000 Encounter for general adult medical examination without abnormal findings: Secondary | ICD-10-CM | POA: Diagnosis not present

## 2023-10-14 NOTE — Progress Notes (Signed)
 Subjective:   [name] is a 68 y.o. female who presents for Medicare Annual (Subsequent) preventive examination.  Visit Complete: Virtual I connected with  [name] on 10/14/23 by a audio enabled telemedicine application and verified that I am speaking with the correct person using two identifiers.  Patient Location: Home  Provider Location: Office/Clinic  I discussed the limitations of evaluation and management by telemedicine. The patient expressed understanding and agreed to proceed.  Vital Signs: Because this visit was a virtual/telehealth visit, some criteria may be missing or patient reported. Any vitals not documented were not able to be obtained and vitals that have been documented are patient reported.   Cardiac Risk Factors include: advanced age (>59men, >32 women);dyslipidemia     Objective:    There were no vitals filed for this visit. There is no height or weight on file to calculate BMI.     10/14/2023    2:18 PM 09/20/2022    9:44 AM 01/22/2015    2:25 PM 02/06/2014   11:18 AM 11/07/2012    6:56 AM  Advanced Directives  Does Patient Have a Medical Advance Directive? No No No Patient would like information Patient does not have advance directive  Would patient like information on creating a medical advance directive? No - Patient declined No - Patient declined No - patient declined information Advance directive brochure given (Outpatient ONLY)   Pre-existing out of facility DNR order (yellow form or pink MOST form)     No    Current Medications (verified) Outpatient Encounter Medications as of 10/14/2023  Medication Sig   acetaminophen (TYLENOL) 500 MG tablet Take 1,000 mg by mouth every 6 (six) hours as needed for headache or mild pain.   albuterol (VENTOLIN HFA) 108 (90 Base) MCG/ACT inhaler INHALE 1 - 2 PUFFS INTO THE LUNGS EVERY 4 HOURS AS NEEDED FOR COUGH, WHEEZING, OR CHEST TIGHTNESS   Ascorbic Acid (VITAMIN C PO) Take 1 tablet by mouth daily.    Bacillus Coagulans-Inulin (PROBIOTIC) 1-250 BILLION-MG CAPS Take by mouth.   baclofen (LIORESAL) 20 MG tablet Take 1 tablet (20 mg total) by mouth at bedtime as needed for muscle spasms.   buPROPion (WELLBUTRIN XL) 150 MG 24 hr tablet Take 1 tablet (150 mg total) by mouth every morning.   busPIRone (BUSPAR) 7.5 MG tablet Take 1 tablet (7.5 mg total) by mouth 2 (two) times daily.   famotidine (PEPCID) 40 MG tablet Take 1 tablet (40 mg total) by mouth at bedtime.   fluticasone (FLONASE) 50 MCG/ACT nasal spray Place 2 sprays into both nostrils daily as needed for allergies.   hydrOXYzine (VISTARIL) 25 MG capsule Take 1 capsule (25 mg total) by mouth daily.   LORazepam (ATIVAN) 0.5 MG tablet Take1 tablet by mouth daily as needed for severe anxiety (this is a 49-month supply)   omeprazole (PRILOSEC) 40 MG capsule TAKE 1 CAPSULE (40 MG TOTAL) BY MOUTH DAILY.   potassium chloride SA (KLOR-CON M) 20 MEQ tablet TAKE 1 TABLET BY MOUTH DAILY. TAKE SECOND TABLET DAILY AS NEEDED   promethazine (PHENERGAN) 12.5 MG tablet TAKE 1 TABLET(12.5 MG) BY MOUTH EVERY 8 HOURS AS NEEDED FOR NAUSEA OR VOMITING   rOPINIRole (REQUIP) 1 MG tablet Take 1 tablet by mouth at bedtime   rosuvastatin (CRESTOR) 40 MG tablet Take 1 tablet (40 mg total) by mouth daily.   torsemide (DEMADEX) 20 MG tablet Take 1 tablet (20 mg total) by mouth daily as needed.   traZODone (DESYREL) 50  MG tablet Take 1-2 tablets by mouth at bedtime for sleep as needed   VITAMIN D PO Take 1 tablet by mouth daily.   [DISCONTINUED] benzonatate (TESSALON) 100 MG capsule Take 1 capsule (100 mg total) by mouth 3 (three) times daily as needed for cough.   [DISCONTINUED] pramipexole (MIRAPEX) 0.25 MG tablet Take 1-2 tablets (0.25-0.5 mg total) by mouth at bedtime.   [DISCONTINUED] predniSONE (DELTASONE) 20 MG tablet Take 1 tablet (20 mg total) by mouth daily with breakfast.   No facility-administered encounter medications on file as of 10/14/2023.    Allergies  (verified) Elemental sulfur, Carbamazepine, Ceclor [cefaclor], Morphine and codeine, and Atorvastatin   History: Past Medical History:  Diagnosis Date   Acute sinusitis 03/27/2013   Allergy    seasonal   Anxiety    Arthritis 06/09/2013   Depression    Dyspareunia    Edema extremities    Encounter for preventative adult health care exam with abnormal findings 04/21/2014   GERD (gastroesophageal reflux disease)    Hip pain, left 11/01/2011   History of proteinuria syndrome    Hyperlipidemia    Insomnia 03/11/2013   Low back pain 05/09/2011   Lymphadenitis 06/16/2012   Myalgia and myositis 02/15/2015   Obese    Onychomycosis 05/09/2011   Oral lesion 08/26/2013   Otitis externa 06/23/2012   left   Perimenopause 03/11/2013   RLS (restless legs syndrome)    RUQ pain 09/20/2011   Sleep apnea 04/21/2014   Tinea corporis 04/26/2017   Unspecified constipation 05/13/2013   Urinary frequency 08/26/2013   Past Surgical History:  Procedure Laterality Date   BACK SURGERY  2006   low, discectomy for herniated disc   BLADDER SUSPENSION N/A 11/07/2012   Procedure: [facility] Fall SLING;  Surgeon: Anner Crete, MD;  Location: [facility];  Service: Urology;  Laterality: N/A;   CARPAL TUNNEL RELEASE Right 2006   CESAREAN SECTION  1993   CYSTOSCOPY N/A 11/07/2012   Procedure: CYSTOSCOPY;  Surgeon: Anner Crete, MD;  Location: [facility];  Service: Urology;  Laterality: N/A;   TUBAL LIGATION  1993   Family History  Problem Relation Age of Onset   Cancer Mother 16       thyroid, malignant brain tumor   Arthritis Mother        neck, back   Arthritis Father        back pain   Heart disease Father        stent   Hepatitis Sister        Hepatitis C   Heart disease Sister        stent   Breast cancer Maternal Aunt    Colon polyps Maternal Grandmother    Diabetes Maternal Grandmother    Colon cancer Neg Hx    Rectal cancer Neg Hx    Stomach cancer Neg Hx     Social History   Socioeconomic History   Marital status: Divorced    Spouse name: Not on file   Number of children: Not on file   Years of education: Not on file   Highest education level: Not on file  Occupational History   Not on file  Tobacco Use   Smoking status: Former    Current packs/day: 0.00    Average packs/day: 0.5 packs/day for 20.0 years (10.0 ttl pk-yrs)    Types: Cigarettes    Start date: 07/12/1972    Quit date: 07/12/1992    Years since quitting:  31.2   Smokeless tobacco: Never  Substance and Sexual Activity   Alcohol use: Yes    Comment: occasionaly   Drug use: No   Sexual activity: Yes    Partners: Male  Other Topics Concern   Not on file  Social History Narrative   Not on file   Social Drivers of Health   Financial Resource Strain: Medium Risk (10/14/2023)   Overall Financial Resource Strain (CARDIA)    Difficulty of Paying Living Expenses: Somewhat hard  Food Insecurity: No Food Insecurity (10/14/2023)   Hunger Vital Sign    Worried About Running Out of Food in the Last Year: Never true    Ran Out of Food in the Last Year: Never true  Transportation Needs: No Transportation Needs (10/14/2023)   PRAPARE - Administrator, Civil Service (Medical): No    Lack of Transportation (Non-Medical): No  Physical Activity: Sufficiently Active (10/14/2023)   Exercise Vital Sign    Days of Exercise per Week: 7 days    Minutes of Exercise per Session: 30 min  Stress: No Stress Concern Present (10/14/2023)   [employer] of Occupational Health - Occupational Stress Questionnaire    Feeling of Stress : Only a little  Social Connections: Moderately Isolated (10/14/2023)   Social Connection and Isolation Panel [NHANES]    Frequency of Communication with Friends and Family: Once a week    Frequency of Social Gatherings with Friends and Family: Once a week    Attends Religious Services: More than 4 times per year    Active Member of [employer] or Organizations:  Yes    Attends Engineer, structural: More than 4 times per year    Marital Status: Divorced    Tobacco Counseling Counseling given: Not Answered   Clinical Intake:  Pre-visit preparation completed: Yes  Pain : No/denies pain  Nutritional Risks: None Diabetes: No  How often do you need to have someone help you when you read instructions, pamphlets, or other written materials from your doctor or pharmacy?: 1 - Never  Interpreter Needed?: No  Information entered by :: [employer], CMA   Activities of Daily Living    10/14/2023    2:19 PM  In your present state of health, do you have any difficulty performing the following activities:  Hearing? 0  Vision? 0  Difficulty concentrating or making decisions? 1  Comment remembering things  Walking or climbing stairs? 0  Dressing or bathing? 0  Doing errands, shopping? 0  Preparing Food and eating ? N  Using the Toilet? N  In the past six months, have you accidently leaked urine? Y  Comment ocassionally, if bladder is really full  Do you have problems with loss of bowel control? N  Managing your Medications? N  Managing your Finances? N  Housekeeping or managing your Housekeeping? N    Patient Care Team: Bradd Canary, MD as PCP - General (Family Medicine) Louis Meckel, MD (Inactive) as Consulting Physician (Gastroenterology) Loletha Carrow, MD as Consulting Physician (Ophthalmology)  Indicate any recent Medical Services you may have received from other than [facility] providers in the past year (date may be approximate).     Assessment:   This is a routine wellness examination for [place].  Hearing/Vision screen No results found.   Goals Addressed   None    Depression Screen    10/14/2023    2:36 PM 04/05/2023   10:02 AM 01/11/2023    1:47 PM 09/20/2022  9:47 AM 09/07/2022   10:37 AM 05/11/2022   10:39 AM 01/07/2022   10:02 AM  PHQ 2/9 Scores  PHQ - 2 Score 0 0 0 0 0 0 1  PHQ- 9 Score  0 0  0 3 3     Fall Risk    10/14/2023    2:23 PM 04/05/2023   10:02 AM 01/11/2023    1:46 PM 09/20/2022    9:44 AM 09/07/2022   10:37 AM  Fall Risk   Falls in the past year? 0 0  0 0  Number falls in past yr: 0 0 0 0 0  Injury with Fall? 0 0 0 0 0  Risk for fall due to : No Fall Risks  No Fall Risks No Fall Risks   Follow up Falls evaluation completed Falls evaluation completed  Falls evaluation completed Falls evaluation completed    MEDICARE RISK AT HOME: Medicare Risk at Home Any stairs in or around the home?: Yes If so, are there any without handrails?: No Home free of loose throw rugs in walkways, pet beds, electrical cords, etc?: Yes Adequate lighting in your home to reduce risk of falls?: Yes Life alert?: No Use of a cane, walker or w/c?: No Grab bars in the bathroom?: No Shower chair or bench in shower?: No Elevated toilet seat or a handicapped toilet?: No  TIMED UP AND GO:  Was the test performed?  No    Cognitive Function:        10/14/2023    2:37 PM 09/20/2022    9:56 AM  6CIT Screen  What Year? 0 points 0 points  What month? 0 points 0 points  What time? 0 points 0 points  Count back from 20 0 points 0 points  Months in reverse 0 points 0 points  Repeat phrase 0 points 0 points  Total Score 0 points 0 points    Immunizations Immunization History  Administered Date(s) Administered   Fluad Trivalent(High Dose 65+) 04/05/2023   Influenza Split 05/06/2011, 05/02/2012   Influenza,inj,Quad PF,6+ Mos 04/10/2013, 04/16/2014, 04/18/2015, 04/20/2016, 04/26/2017, 05/09/2018, 04/16/2019, 06/24/2020, 05/04/2021, 05/11/2022   PFIZER(Purple Top)SARS-COV-2 Vaccination 04/22/2020, 05/13/2020   PPD Test 10/09/2013   Pneumococcal Conjugate-13 04/10/2013   Pneumococcal Polysaccharide-23 04/26/2017   Tdap 11/10/2010, 05/11/2022   Zoster Recombinant(Shingrix) 04/05/2023    TDAP status: Up to date  Flu Vaccine status: Up to date  Pneumococcal vaccine status: Due, Education has  been provided regarding the importance of this vaccine. Advised may receive this vaccine at local pharmacy or Health Dept. Aware to provide a copy of the vaccination record if obtained from local pharmacy or Health Dept. Verbalized acceptance and understanding.  Covid-19 vaccine status: Declined, Education has been provided regarding the importance of this vaccine but patient still declined. Advised may receive this vaccine at local pharmacy or Health Dept.or vaccine clinic. Aware to provide a copy of the vaccination record if obtained from local pharmacy or Health Dept. Verbalized acceptance and understanding.  Qualifies for Shingles Vaccine? Yes   Zostavax completed No   Shingrix Completed?: No.    Education has been provided regarding the importance of this vaccine. Patient has been advised to call insurance company to determine out of pocket expense if they have not yet received this vaccine. Advised may also receive vaccine at local pharmacy or Health Dept. Verbalized acceptance and understanding.  Screening Tests Health Maintenance  Topic Date Due   Cervical Cancer Screening (HPV/Pap Cotest)  09/01/2021   Pneumonia Vaccine 65+  Years old (3 of 3 - PPSV23 or PCV20) 01/04/2023   Zoster Vaccines- Shingrix (2 of 2) 05/31/2023   Medicare Annual Wellness (AWV)  09/20/2023   INFLUENZA VACCINE  02/10/2024   MAMMOGRAM  04/17/2025   Fecal DNA (Cologuard)  05/23/2026   DTaP/Tdap/Td (3 - Td or Tdap) 05/11/2032   DEXA SCAN  Completed   Hepatitis C Screening  Completed   HIV Screening  Completed   HPV VACCINES  Aged Out   Colonoscopy  Discontinued   COVID-19 Vaccine  Discontinued    Health Maintenance  Health Maintenance Due  Topic Date Due   Cervical Cancer Screening (HPV/Pap Cotest)  09/01/2021   Pneumonia Vaccine 60+ Years old (3 of 3 - PPSV23 or PCV20) 01/04/2023   Zoster Vaccines- Shingrix (2 of 2) 05/31/2023   Medicare Annual Wellness (AWV)  09/20/2023    Colorectal cancer  screening: Type of screening: Cologuard. Completed 05/24/23. Repeat every 3 years  Mammogram status: Completed 04/18/23. Repeat every year  Bone Density status: Completed 04/18/23. Results reflect: Bone density results: OSTEOPENIA. Repeat every 2 years.  Lung Cancer Screening: (Low Dose CT Chest recommended if Age 70-80 years, 20 pack-year currently smoking OR have quit w/in 15years.) does not qualify.   Additional Screening:  Hepatitis C Screening: does qualify; Completed 04/18/15  Vision Screening: Recommended annual ophthalmology exams for early detection of glaucoma and other disorders of the eye. Is the patient up to date with their annual eye exam?  Yes  Who is the provider or what is the name of the office in which the patient attends annual eye exams? [facility] If pt is not established with a provider, would they like to be referred to a provider to establish care? No .   Dental Screening: Recommended annual dental exams for proper oral hygiene  Diabetic Foot Exam: N/a  Community Resource Referral / Chronic Care Management: CRR required this visit?  No   CCM required this visit?  No     Plan:     I have personally reviewed and noted the following in the patient's chart:   Medical and social history Use of alcohol, tobacco or illicit drugs  Current medications and supplements including opioid prescriptions. Patient is not currently taking opioid prescriptions. Functional ability and status Nutritional status Physical activity Advanced directives List of other physicians Hospitalizations, surgeries, and ER visits in previous 12 months Vitals Screenings to include cognitive, depression, and falls Referrals and appointments  In addition, I have reviewed and discussed with patient certain preventive protocols, quality metrics, and best practice recommendations. A written personalized care plan for preventive services as well as general preventive health recommendations  were provided to patient.     Donne Anon, CMA   10/14/2023   After Visit Summary: (MyChart) Due to this being a telephonic visit, the after visit summary with patients personalized plan was offered to patient via MyChart   Nurse Notes: None

## 2023-10-14 NOTE — Patient Instructions (Signed)
 [name] , Thank you for taking time to come for your Medicare Wellness Visit. I appreciate your ongoing commitment to your health goals. Please review the following plan we discussed and let me know if I can assist you in the future.     This is a list of the screening recommended for you and due dates:  Health Maintenance  Topic Date Due   Pap with HPV screening  09/01/2021   Pneumonia Vaccine (3 of 3 - PPSV23 or PCV20) 01/04/2023   Zoster (Shingles) Vaccine (2 of 2) 05/31/2023   Flu Shot  02/10/2024   Medicare Annual Wellness Visit  10/13/2024   Mammogram  04/17/2025   Cologuard (Stool DNA test)  05/23/2026   DTaP/Tdap/Td vaccine (3 - Td or Tdap) 05/11/2032   DEXA scan (bone density measurement)  Completed   Hepatitis C Screening  Completed   HIV Screening  Completed   HPV Vaccine  Aged Out   Colon Cancer Screening  Discontinued   COVID-19 Vaccine  Discontinued    Next appointment: Follow up in one year for your annual wellness visit    Preventive Care 65 Years and Older, Female Preventive care refers to lifestyle choices and visits with your health care provider that can promote health and wellness. What does preventive care include? A yearly physical exam. This is also called an annual well check. Dental exams once or twice a year. Routine eye exams. Ask your health care provider how often you should have your eyes checked. Personal lifestyle choices, including: Daily care of your teeth and gums. Regular physical activity. Eating a healthy diet. Avoiding tobacco and drug use. Limiting alcohol use. Practicing safe sex. Taking low-dose aspirin every day. Taking vitamin and mineral supplements as recommended by your health care provider. What happens during an annual well check? The services and screenings done by your health care provider during your annual well check will depend on your age, overall health, lifestyle risk factors, and family history of  disease. Counseling  Your health care provider may ask you questions about your: Alcohol use. Tobacco use. Drug use. Emotional well-being. Home and relationship well-being. Sexual activity. Eating habits. History of falls. Memory and ability to understand (cognition). Work and work Astronomer. Reproductive health. Screening  You may have the following tests or measurements: Height, weight, and BMI. Blood pressure. Lipid and cholesterol levels. These may be checked every 5 years, or more frequently if you are over 56 years old. Skin check. Lung cancer screening. You may have this screening every year starting at age 23 if you have a 30-pack-year history of smoking and currently smoke or have quit within the past 15 years. Fecal occult blood test (FOBT) of the stool. You may have this test every year starting at age 3. Flexible sigmoidoscopy or colonoscopy. You may have a sigmoidoscopy every 5 years or a colonoscopy every 10 years starting at age 21. Hepatitis C blood test. Hepatitis B blood test. Sexually transmitted disease (STD) testing. Diabetes screening. This is done by checking your blood sugar (glucose) after you have not eaten for a while (fasting). You may have this done every 1-3 years. Bone density scan. This is done to screen for osteoporosis. You may have this done starting at age 30. Mammogram. This may be done every 1-2 years. Talk to your health care provider about how often you should have regular mammograms. Talk with your health care provider about your test results, treatment options, and if necessary, the need for more  tests. Vaccines  Your health care provider may recommend certain vaccines, such as: Influenza vaccine. This is recommended every year. Tetanus, diphtheria, and acellular pertussis (Tdap, Td) vaccine. You may need a Td booster every 10 years. Zoster vaccine. You may need this after age 71. Pneumococcal 13-valent conjugate (PCV13) vaccine. One  dose is recommended after age 37. Pneumococcal polysaccharide (PPSV23) vaccine. One dose is recommended after age 53. Talk to your health care provider about which screenings and vaccines you need and how often you need them. This information is not intended to replace advice given to you by your health care provider. Make sure you discuss any questions you have with your health care provider. Document Released: 07/25/2015 Document Revised: 03/17/2016 Document Reviewed: 04/29/2015 Elsevier Interactive Patient Education  2017 [employer].  Fall Prevention in the Home Falls can cause injuries. They can happen to people of all ages. There are many things you can do to make your home safe and to help prevent falls. What can I do on the outside of my home? Regularly fix the edges of walkways and driveways and fix any cracks. Remove anything that might make you trip as you walk through a door, such as a raised step or threshold. Trim any bushes or trees on the path to your home. Use bright outdoor lighting. Clear any walking paths of anything that might make someone trip, such as rocks or tools. Regularly check to see if handrails are loose or broken. Make sure that both sides of any steps have handrails. Any raised decks and porches should have guardrails on the edges. Have any leaves, snow, or ice cleared regularly. Use sand or salt on walking paths during winter. Clean up any spills in your garage right away. This includes oil or grease spills. What can I do in the bathroom? Use night lights. Install grab bars by the toilet and in the tub and shower. Do not use towel bars as grab bars. Use non-skid mats or decals in the tub or shower. If you need to sit down in the shower, use a plastic, non-slip stool. Keep the floor dry. Clean up any water that spills on the floor as soon as it happens. Remove soap buildup in the tub or shower regularly. Attach bath mats securely with double-sided  non-slip rug tape. Do not have throw rugs and other things on the floor that can make you trip. What can I do in the bedroom? Use night lights. Make sure that you have a light by your bed that is easy to reach. Do not use any sheets or blankets that are too big for your bed. They should not hang down onto the floor. Have a firm chair that has side arms. You can use this for support while you get dressed. Do not have throw rugs and other things on the floor that can make you trip. What can I do in the kitchen? Clean up any spills right away. Avoid walking on wet floors. Keep items that you use a lot in easy-to-reach places. If you need to reach something above you, use a strong step stool that has a grab bar. Keep electrical cords out of the way. Do not use floor polish or wax that makes floors slippery. If you must use wax, use non-skid floor wax. Do not have throw rugs and other things on the floor that can make you trip. What can I do with my stairs? Do not leave any items on the stairs. Make sure  that there are handrails on both sides of the stairs and use them. Fix handrails that are broken or loose. Make sure that handrails are as long as the stairways. Check any carpeting to make sure that it is firmly attached to the stairs. Fix any carpet that is loose or worn. Avoid having throw rugs at the top or bottom of the stairs. If you do have throw rugs, attach them to the floor with carpet tape. Make sure that you have a light switch at the top of the stairs and the bottom of the stairs. If you do not have them, ask someone to add them for you. What else can I do to help prevent falls? Wear shoes that: Do not have high heels. Have rubber bottoms. Are comfortable and fit you well. Are closed at the toe. Do not wear sandals. If you use a stepladder: Make sure that it is fully opened. Do not climb a closed stepladder. Make sure that both sides of the stepladder are locked into place. Ask  someone to hold it for you, if possible. Clearly mark and make sure that you can see: Any grab bars or handrails. First and last steps. Where the edge of each step is. Use tools that help you move around (mobility aids) if they are needed. These include: Canes. Walkers. Scooters. Crutches. Turn on the lights when you go into a dark area. Replace any light bulbs as soon as they burn out. Set up your furniture so you have a clear path. Avoid moving your furniture around. If any of your floors are uneven, fix them. If there are any pets around you, be aware of where they are. Review your medicines with your doctor. Some medicines can make you feel dizzy. This can increase your chance of falling. Ask your doctor what other things that you can do to help prevent falls. This information is not intended to replace advice given to you by your health care provider. Make sure you discuss any questions you have with your health care provider. Document Released: 04/24/2009 Document Revised: 12/04/2015 Document Reviewed: 08/02/2014 Elsevier Interactive Patient Education  2017 [employer].

## 2023-10-19 ENCOUNTER — Other Ambulatory Visit: Payer: Self-pay | Admitting: Family Medicine

## 2023-10-20 NOTE — Telephone Encounter (Signed)
 Requesting: lorazepam 0.5mg   Contract: 04/26/2014 UDS: 08/01/2017 Last Visit: 08/02/23 Next Visit: 11/28/23 Last Refill: 04/05/23 #30 and 5RF   Please Advise

## 2023-11-01 ENCOUNTER — Other Ambulatory Visit: Payer: Self-pay | Admitting: Family Medicine

## 2023-11-14 ENCOUNTER — Ambulatory Visit: Payer: Self-pay

## 2023-11-14 NOTE — Telephone Encounter (Signed)
 Chief Complaint: Cough/Ear symptoms Symptoms: Cough with yellow sputum, Right ear with Swooshing sound Frequency: Saturday evening Pertinent Negatives: Patient denies fever, CP, SOB Disposition: [] ED /[] Urgent Care (no appt availability in office) / [x] Appointment(In office/virtual)/ []  [facility]/ [] Home Care/ [] Refused Recommended Disposition /[] [facility]/ []  Follow-up with PCP Additional Notes: patient who endorses several months of intermittent cough, reports productive cough with yellow mucus and right ear concern. Patient states that since Saturday, she has been having a 'swooshing' sound to her right ear. Patient thought she might have gotten water  in her ear from washing her hair. Patient states she has never had this before and is concerned. Patient states she coughed up yellow sputum today. No fever, CP or SOB. Per protocol, patient is recommended to be seen with 24 hours. Appointment made for tomorrow with another provider in PCP office for 1:00 PM. Patient verbalized understanding of the plan and all questions answered.    Copied from CRM [id number]. Topic: Clinical - Red Word Triage >> Nov 14, 2023  8:27 AM Kita Perish H wrote: [facility] that prompted transfer to Nurse Triage: Coughing up yellow mucus, right ear making a swooshing noise Reason for Disposition  Decreased hearing (or another adult says that the ear canal is completely blocked with discharge)  [1] Continuous (nonstop) coughing interferes with work or school AND [2] no improvement using cough treatment per Care Advice  Answer Assessment - Initial Assessment Questions 1. ONSET: "When did the cough begin?"      Cough has been going on for quite awhile but cough got worse recently 2. SEVERITY: "How bad is the cough today?"      5 3. SPUTUM: "Describe the color of your sputum" (none, dry cough; clear, white, yellow, green)     yellow 4. HEMOPTYSIS: "Are you coughing up any blood?" If so ask: "How  much?" (flecks, streaks, tablespoons, etc.)     no 5. DIFFICULTY BREATHING: "Are you having difficulty breathing?" If Yes, ask: "How bad is it?" (e.g., mild, moderate, severe)    - MILD: No SOB at rest, mild SOB with walking, speaks normally in sentences, can lie down, no retractions, pulse < 100.    - MODERATE: SOB at rest, SOB with minimal exertion and prefers to sit, cannot lie down flat, speaks in phrases, mild retractions, audible wheezing, pulse 100-120.    - SEVERE: Very SOB at rest, speaks in single words, struggling to breathe, sitting hunched forward, retractions, pulse > 120      no 6. FEVER: "Do you have a fever?" If Yes, ask: "What is your temperature, how was it measured, and when did it start?"     NO 7. CARDIAC HISTORY: "Do you have any history of heart disease?" (e.g., heart attack, congestive heart failure)      No 8. LUNG HISTORY: "Do you have any history of lung disease?"  (e.g., pulmonary embolus, asthma, emphysema)     Hx of smoking 9. PE RISK FACTORS: "Do you have a history of blood clots?" (or: recent major surgery, recent prolonged travel, bedridden)     No 10. OTHER SYMPTOMS: "Do you have any other symptoms?" (e.g., runny nose, wheezing, chest pain)       Right ear pain-with Swooshing sound 12. TRAVEL: "Have you traveled out of the country in the last month?" (e.g., travel history, exposures)       no  Answer Assessment - Initial Assessment Questions 1. LOCATION: "Which ear is involved?"  Right ear 2. SYMPTOMS: "What are the main symptoms?" (e.g., pain, redness, itching, discharge)     Hearing noises-"Swooshing sound" 3. MOVEMENT: "Does the pain increase when the ear is moved up and down?" Does pushing on the tab of tissue in the front of the ear increase the pain?"      No 4. PAIN: "How bad is the pain?"  (Scale 1-10; mild, moderate or severe)   - MILD (1-3): doesn't interfere with normal activities    - MODERATE (4-7): interferes with normal activities or  awakens from sleep    - SEVERE (8-10): excruciating pain, unable to do any normal activities      No pain 5. ONSET: "When did the ear symptoms start?"      Saturday evening 6. DISCHARGE: "Is there any discharge? What color is it?"      No 7. SWIMMING: "Have you been swimming recently?" If Yes, ask: "How often do you swim? Is it in a pool, lake or ocean?"      No recent swimming 8. COTTON EAR SWABS: "Do you use cotton ear swabs (Q-tips)? How often?" (e.g., never, daily, weekly)     no  Protocols used: Cough - Acute Productive-A-AH, Ear - Swimmer's-A-AH

## 2023-11-15 ENCOUNTER — Encounter: Payer: Self-pay | Admitting: Physician Assistant

## 2023-11-15 ENCOUNTER — Ambulatory Visit (HOSPITAL_BASED_OUTPATIENT_CLINIC_OR_DEPARTMENT_OTHER)
Admission: RE | Admit: 2023-11-15 | Discharge: 2023-11-15 | Disposition: A | Discharge status: Home / Self Care | Source: Ambulatory Visit | Attending: Physician Assistant | Admitting: Physician Assistant

## 2023-11-15 ENCOUNTER — Ambulatory Visit (INDEPENDENT_AMBULATORY_CARE_PROVIDER_SITE_OTHER): Admitting: Physician Assistant

## 2023-11-15 VITALS — BP 97/62 | HR 61 | Temp 97.6°F | Ht 65.0 in | Wt 187.2 lb

## 2023-11-15 DIAGNOSIS — H93A9 Pulsatile tinnitus, unspecified ear: Secondary | ICD-10-CM

## 2023-11-15 DIAGNOSIS — R053 Chronic cough: Secondary | ICD-10-CM | POA: Diagnosis not present

## 2023-11-15 MED ORDER — BUDESONIDE-FORMOTEROL FUMARATE 160-4.5 MCG/ACT IN AERO: 2.0000 | INHALATION_SPRAY | Freq: Two times a day (BID) | RESPIRATORY_TRACT | 3 refills | Status: DC

## 2023-11-15 NOTE — Progress Notes (Signed)
 Established patient visit   Patient: [name]   DOB: May 06, 1956   68 y.o. Female  MRN: [id number] Visit Date: 11/15/2023  Today's healthcare provider: Trenton Frock, PA-C   Cc. Tinnitus, cough  Subjective     Pt reports a chronic, intermittent cough for the last several months, since October per pt. This is triggered by activity, certain stimulants -- spicy food, pepper, gong outside. She uses the albuterol  inhaler intermittently and is always using throat lozenges.   She also reports right ear discomfort-- a buzzing and a whooshing noise intermittently for the last 4 months. Denies hearing loss ,pain, some itching. Reports occasional headaches that she manages with tylenol .  Medications: Outpatient Medications Prior to Visit  Medication Sig   acetaminophen  (TYLENOL ) 500 MG tablet Take 1,000 mg by mouth every 6 (six) hours as needed for headache or mild pain.   albuterol  (VENTOLIN  HFA) 108 (90 Base) MCG/ACT inhaler INHALE 1 - 2 PUFFS INTO THE LUNGS EVERY 4 HOURS AS NEEDED FOR COUGH, WHEEZING, OR CHEST TIGHTNESS   Ascorbic Acid (VITAMIN C PO) Take 1 tablet by mouth daily.   Bacillus Coagulans-Inulin (PROBIOTIC) 1-250 BILLION-MG CAPS Take by mouth.   baclofen  (LIORESAL ) 20 MG tablet Take 1 tablet (20 mg total) by mouth at bedtime as needed for muscle spasms.   buPROPion  (WELLBUTRIN  XL) 150 MG 24 hr tablet Take 1 tablet (150 mg total) by mouth every morning.   busPIRone  (BUSPAR ) 7.5 MG tablet Take 1 tablet (7.5 mg total) by mouth 2 (two) times daily.   famotidine  (PEPCID ) 40 MG tablet Take 1 tablet (40 mg total) by mouth at bedtime.   fluticasone  (FLONASE ) 50 MCG/ACT nasal spray Place 2 sprays into both nostrils daily as needed for allergies.   hydrOXYzine  (VISTARIL ) 25 MG capsule Take 1 capsule (25 mg total) by mouth daily.   LORazepam  (ATIVAN ) 0.5 MG tablet TAKE 1 TABLET BY MOUTH DAILY AS NEEDED FOR SEVERE ANXIETY   omeprazole  (PRILOSEC) 40 MG capsule TAKE 1 CAPSULE (40  MG TOTAL) BY MOUTH DAILY.   potassium chloride  SA (KLOR-CON  M) 20 MEQ tablet TAKE 1 TABLET BY MOUTH DAILY. TAKE SECOND TABLET DAILY AS NEEDED   promethazine  (PHENERGAN ) 12.5 MG tablet TAKE 1 TABLET(12.5 MG) BY MOUTH EVERY 8 HOURS AS NEEDED FOR NAUSEA OR VOMITING   rOPINIRole  (REQUIP ) 1 MG tablet Take 1 tablet by mouth at bedtime   rosuvastatin  (CRESTOR ) 40 MG tablet Take 1 tablet (40 mg total) by mouth daily.   torsemide  (DEMADEX ) 20 MG tablet Take 1 tablet (20 mg total) by mouth daily as needed.   traZODone  (DESYREL ) 50 MG tablet Take 1-2 tablets by mouth at bedtime for sleep as needed   VITAMIN D PO Take 1 tablet by mouth daily.   No facility-administered medications prior to visit.    Review of Systems  Constitutional:  Negative for fatigue and fever.  HENT:  Positive for tinnitus.   Respiratory:  Positive for cough. Negative for shortness of breath and wheezing.   Cardiovascular:  Negative for chest pain and leg swelling.  Gastrointestinal:  Negative for abdominal pain.  Neurological:  Negative for dizziness and headaches.       Objective    BP 97/62   Pulse 61   Temp 97.6 F (36.4 C)   Ht 5\' 5"  (1.651 m)   Wt 187 lb 3.2 oz (84.9 kg)   LMP 10/03/2012   SpO2 95%   BMI 31.15 kg/m    Physical Exam  Constitutional:      General: She is awake.     Appearance: She is well-developed.  HENT:     Head: Normocephalic.     Right Ear: Tympanic membrane normal.     Left Ear: Tympanic membrane normal.  Eyes:     Conjunctiva/sclera: Conjunctivae normal.  Cardiovascular:     Rate and Rhythm: Normal rate and regular rhythm.     Heart sounds: Normal heart sounds.  Pulmonary:     Effort: Pulmonary effort is normal.     Breath sounds: Normal breath sounds.  Skin:    General: Skin is warm.  Neurological:     Mental Status: She is alert and oriented to person, place, and time.  Psychiatric:        Attention and Perception: Attention normal.        Mood and Affect: Mood normal.         Speech: Speech normal.        Behavior: Behavior is cooperative.      No results found for any visits on 11/15/23.  Assessment & Plan    Pulsatile tinnitus -     Ambulatory referral to ENT  Chronic cough -     Budesonide-Formoterol Fumarate; Inhale 2 puffs into the lungs 2 (two) times daily.  Dispense: 1 each; Refill: 3 -     DG Chest 2 View   Recommending chest xray given chronic cough x 6 + mo . Trial of symbicort bid on top of prn albuterol . Pending xray, will refer to pulm.   Recommending referral to ENT for new persistent pulsatile tinnitus.   Return if symptoms worsen or fail to improve.       Trenton Frock, PA-C  [facility] [phone] (phone) [phone] (fax)  [facility]

## 2023-11-16 ENCOUNTER — Other Ambulatory Visit (HOSPITAL_COMMUNITY): Payer: Self-pay

## 2023-11-16 ENCOUNTER — Telehealth: Payer: Self-pay

## 2023-11-16 NOTE — Telephone Encounter (Signed)
 Pharmacy Patient Advocate Encounter  Received notification from [facility] that Prior Authorization for Symbicort 160-4.5MCG/ACT aerosol has been APPROVED from 11/16/23 to 11/15/24. Ran test claim, Copay is $159.41. This test claim was processed through [facility]- copay amounts may vary at other pharmacies due to pharmacy/plan contracts, or as the patient moves through the different stages of their insurance plan.   PA #/Case ID/Reference #: [id number]  *high copay due to a $114.41 deductible, copay after deductible is met should be $45

## 2023-11-17 ENCOUNTER — Encounter: Payer: Self-pay | Admitting: Physician Assistant

## 2023-11-21 ENCOUNTER — Encounter (HOSPITAL_COMMUNITY): Payer: Self-pay

## 2023-11-27 NOTE — Assessment & Plan Note (Signed)
 Encourage heart healthy diet such as MIND or DASH diet, increase exercise, avoid trans fats, simple carbohydrates and processed foods, consider a krill or fish or flaxseed oil cap daily.

## 2023-11-27 NOTE — Assessment & Plan Note (Signed)
 [id number] acceptable, minimize simple carbs. Increase exercise as tolerated.

## 2023-11-27 NOTE — Assessment & Plan Note (Signed)
 Hydrate and monitor

## 2023-11-27 NOTE — Assessment & Plan Note (Signed)
 Encouraged good sleep hygiene such as dark, quiet room. No blue/green glowing lights such as computer screens in bedroom. No alcohol or stimulants in evening. Cut down on caffeine as able. Regular exercise is helpful but not just prior to bed time.

## 2023-11-28 ENCOUNTER — Encounter: Payer: Self-pay | Admitting: Family Medicine

## 2023-11-28 ENCOUNTER — Ambulatory Visit (INDEPENDENT_AMBULATORY_CARE_PROVIDER_SITE_OTHER): Payer: Medicare Other | Admitting: Family Medicine

## 2023-11-28 VITALS — BP 118/70 | HR 72 | Resp 16 | Ht 65.0 in | Wt 186.0 lb

## 2023-11-28 DIAGNOSIS — R739 Hyperglycemia, unspecified: Secondary | ICD-10-CM

## 2023-11-28 DIAGNOSIS — R252 Cramp and spasm: Secondary | ICD-10-CM | POA: Diagnosis not present

## 2023-11-28 DIAGNOSIS — G4700 Insomnia, unspecified: Secondary | ICD-10-CM

## 2023-11-28 DIAGNOSIS — T7840XS Allergy, unspecified, sequela: Secondary | ICD-10-CM

## 2023-11-28 DIAGNOSIS — Z79899 Other long term (current) drug therapy: Secondary | ICD-10-CM | POA: Diagnosis not present

## 2023-11-28 DIAGNOSIS — R059 Cough, unspecified: Secondary | ICD-10-CM | POA: Diagnosis not present

## 2023-11-28 DIAGNOSIS — E782 Mixed hyperlipidemia: Secondary | ICD-10-CM

## 2023-11-28 MED ORDER — MONTELUKAST SODIUM 10 MG PO TABS: 10.0000 mg | ORAL_TABLET | Freq: Every day | ORAL | 3 refills | Status: DC

## 2023-11-28 MED ORDER — FAMOTIDINE 40 MG PO TABS: 40.0000 mg | ORAL_TABLET | Freq: Every day | ORAL | 3 refills | Status: AC

## 2023-11-28 NOTE — Progress Notes (Signed)
 Subjective:    Patient ID: [name], female    DOB: 02-19-1956, 67 y.o.   MRN: [id number]  Chief Complaint  Patient presents with   Medical Management of Chronic Issues    Patient presents today for a 4 month follow-up   Quality Metric Gaps    Pap, zoster, pneumonia     HPI Discussed the use of AI scribe software for clinical note transcription with the patient, who gave verbal consent to proceed.  History of Present Illness [name] is a 68 year old female with allergies and GERD who presents with persistent cough and ear ringing.  She has experienced a persistent cough and ringing in her right ear since April. The cough is productive with thick, mainly white mucus, occasionally light yellow. No fever or chills. She previously saw a PA who prescribed an inhaler, but she declined it due to cost, opting to use an existing inhaler. She believes her symptoms are related to severe allergies, which have worsened since she stopped allergy shots due to insurance issues. She has not used the albuterol  inhaler recently. She also experiences postnasal drip and suspects her gastroesophageal reflux disease (GERD) might contribute to her symptoms. She takes over-the-counter omeprazole  daily for indigestion, which helps manage her GERD symptoms. She occasionally experiences stomach pain.  She reports right hip pain for the past six months, which worsens with movement and when climbing stairs. She has not had any falls and has tried topical treatments like Biofreeze without significant relief. The pain impacts her ability to carry her dog up the stairs.  She has not yet received her second shingles shot, which was due within six months of the first. She expresses concerns about getting the shot at her local pharmacy due to long wait times and staffing issues.    Past Medical History:  Diagnosis Date   Acute sinusitis 03/27/2013   Allergy    seasonal   Anxiety    Arthritis 06/09/2013    Depression    Dyspareunia    Edema extremities    Encounter for preventative adult health care exam with abnormal findings 04/21/2014   GERD (gastroesophageal reflux disease)    Hip pain, left 11/01/2011   History of proteinuria syndrome    Hyperlipidemia    Insomnia 03/11/2013   Low back pain 05/09/2011   Lymphadenitis 06/16/2012   Myalgia and myositis 02/15/2015   Obese    Onychomycosis 05/09/2011   Oral lesion 08/26/2013   Otitis externa 06/23/2012   left   Perimenopause 03/11/2013   RLS (restless legs syndrome)    RUQ pain 09/20/2011   Sleep apnea 04/21/2014   Tinea corporis 04/26/2017   Unspecified constipation 05/13/2013   Urinary frequency 08/26/2013    Past Surgical History:  Procedure Laterality Date   BACK SURGERY  2006   low, discectomy for herniated disc   BLADDER SUSPENSION N/A 11/07/2012   Procedure: [facility];  Surgeon: Willye Harvey, MD;  Location: [facility];  Service: Urology;  Laterality: N/A;   CARPAL TUNNEL RELEASE Right 2006   CESAREAN SECTION  1993   CYSTOSCOPY N/A 11/07/2012   Procedure: CYSTOSCOPY;  Surgeon: Willye Harvey, MD;  Location: [facility];  Service: Urology;  Laterality: N/A;   TUBAL LIGATION  1993    Family History  Problem Relation Age of Onset   Cancer Mother 20       thyroid , malignant brain tumor   Arthritis Mother  neck, back   Arthritis Father        back pain   Heart disease Father        stent   Hepatitis Sister        Hepatitis C   Heart disease Sister        stent   Breast cancer Maternal Aunt    Colon polyps Maternal Grandmother    Diabetes Maternal Grandmother    Colon cancer Neg Hx    Rectal cancer Neg Hx    Stomach cancer Neg Hx     Social History   Socioeconomic History   Marital status: Divorced    Spouse name: Not on file   Number of children: Not on file   Years of education: Not on file   Highest education level: Not on file  Occupational History   Not on  file  Tobacco Use   Smoking status: Former    Current packs/day: 0.00    Average packs/day: 0.5 packs/day for 20.0 years (10.0 ttl pk-yrs)    Types: Cigarettes    Start date: 07/12/1972    Quit date: 07/12/1992    Years since quitting: 31.4   Smokeless tobacco: Never  Substance and Sexual Activity   Alcohol use: Yes    Comment: occasionaly   Drug use: No   Sexual activity: Yes    Partners: Male  Other Topics Concern   Not on file  Social History Narrative   Not on file   Social Drivers of Health   Financial Resource Strain: Medium Risk (10/14/2023)   Overall Financial Resource Strain (CARDIA)    Difficulty of Paying Living Expenses: Somewhat hard  Food Insecurity: No Food Insecurity (10/14/2023)   Hunger Vital Sign    Worried About Running Out of Food in the Last Year: Never true    Ran Out of Food in the Last Year: Never true  Transportation Needs: No Transportation Needs (10/14/2023)   PRAPARE - Administrator, Civil Service (Medical): No    Lack of Transportation (Non-Medical): No  Physical Activity: Sufficiently Active (10/14/2023)   Exercise Vital Sign    Days of Exercise per Week: 7 days    Minutes of Exercise per Session: 30 min  Stress: No Stress Concern Present (10/14/2023)   [employer] of Occupational Health - Occupational Stress Questionnaire    Feeling of Stress : Only a little  Social Connections: Moderately Isolated (10/14/2023)   Social Connection and Isolation Panel [NHANES]    Frequency of Communication with Friends and Family: Once a week    Frequency of Social Gatherings with Friends and Family: Once a week    Attends Religious Services: More than 4 times per year    Active Member of [employer] or Organizations: Yes    Attends Engineer, structural: More than 4 times per year    Marital Status: Divorced  Intimate Partner Violence: Not At Risk (10/14/2023)   Humiliation, Afraid, Rape, and Kick questionnaire    Fear of Current or Ex-Partner: No     Emotionally Abused: No    Physically Abused: No    Sexually Abused: No    Outpatient Medications Prior to Visit  Medication Sig Dispense Refill   acetaminophen  (TYLENOL ) 500 MG tablet Take 1,000 mg by mouth every 6 (six) hours as needed for headache or mild pain.     Ascorbic Acid (VITAMIN C PO) Take 1 tablet by mouth daily.     Bacillus Coagulans-Inulin (PROBIOTIC) 1-250 BILLION-MG CAPS  Take by mouth.     baclofen  (LIORESAL ) 20 MG tablet Take 1 tablet (20 mg total) by mouth at bedtime as needed for muscle spasms. 90 tablet 1   buPROPion  (WELLBUTRIN  XL) 150 MG 24 hr tablet Take 1 tablet (150 mg total) by mouth every morning. 90 tablet 1   busPIRone  (BUSPAR ) 7.5 MG tablet Take 1 tablet (7.5 mg total) by mouth 2 (two) times daily. 180 tablet 0   fluticasone  (FLONASE ) 50 MCG/ACT nasal spray Place 2 sprays into both nostrils daily as needed for allergies. 16 g 1   hydrOXYzine  (VISTARIL ) 25 MG capsule Take 1 capsule (25 mg total) by mouth daily. 90 capsule 1   LORazepam  (ATIVAN ) 0.5 MG tablet TAKE 1 TABLET BY MOUTH DAILY AS NEEDED FOR SEVERE ANXIETY 30 tablet 1   potassium chloride  SA (KLOR-CON  M) 20 MEQ tablet TAKE 1 TABLET BY MOUTH DAILY. TAKE SECOND TABLET DAILY AS NEEDED 180 tablet 0   promethazine  (PHENERGAN ) 12.5 MG tablet TAKE 1 TABLET(12.5 MG) BY MOUTH EVERY 8 HOURS AS NEEDED FOR NAUSEA OR VOMITING 30 tablet 5   rOPINIRole  (REQUIP ) 1 MG tablet Take 1 tablet by mouth at bedtime 90 tablet 1   rosuvastatin  (CRESTOR ) 40 MG tablet Take 1 tablet (40 mg total) by mouth daily. 90 tablet 0   torsemide  (DEMADEX ) 20 MG tablet Take 1 tablet (20 mg total) by mouth daily as needed. 90 tablet 1   traZODone  (DESYREL ) 50 MG tablet Take 1-2 tablets by mouth at bedtime for sleep as needed 120 tablet 1   VITAMIN D PO Take 1 tablet by mouth daily.     famotidine  (PEPCID ) 40 MG tablet Take 1 tablet (40 mg total) by mouth at bedtime. 90 tablet 3   albuterol  (VENTOLIN  HFA) 108 (90 Base) MCG/ACT inhaler INHALE  1 - 2 PUFFS INTO THE LUNGS EVERY 4 HOURS AS NEEDED FOR COUGH, WHEEZING, OR CHEST TIGHTNESS 18 g 3   budesonide -formoterol  (SYMBICORT ) 160-4.5 MCG/ACT inhaler Inhale 2 puffs into the lungs 2 (two) times daily. (Patient not taking: Reported on 11/28/2023) 1 each 3   omeprazole  (PRILOSEC) 40 MG capsule TAKE 1 CAPSULE (40 MG TOTAL) BY MOUTH DAILY. 90 capsule 1   No facility-administered medications prior to visit.    Allergies  Allergen Reactions   Elemental Sulfur Diarrhea and Nausea And Vomiting   Carbamazepine Hives   Ceclor [Cefaclor] Other (See Comments)    Tongue swell   Morphine  And Codeine  Itching   Atorvastatin  Other (See Comments)    Leg pain    Review of Systems  Constitutional:  Negative for fever and malaise/fatigue.  HENT:  Positive for congestion.   Eyes:  Negative for blurred vision.  Respiratory:  Positive for cough and sputum production. Negative for shortness of breath.   Cardiovascular:  Negative for chest pain, palpitations and leg swelling.  Gastrointestinal:  Negative for abdominal pain, blood in stool and nausea.  Genitourinary:  Negative for dysuria and frequency.  Musculoskeletal:  Positive for joint pain. Negative for falls.  Skin:  Negative for rash.  Neurological:  Negative for dizziness, loss of consciousness and headaches.  Endo/Heme/Allergies:  Negative for environmental allergies.  Psychiatric/Behavioral:  Negative for depression. The patient is not nervous/anxious.        Objective:     Physical Exam Constitutional:      General: She is not in acute distress.    Appearance: Normal appearance. She is well-developed. She is not toxic-appearing.  HENT:     Head: Normocephalic  and atraumatic.     Right Ear: External ear normal.     Left Ear: External ear normal.     Nose: Nose normal.  Eyes:     General:        Right eye: No discharge.        Left eye: No discharge.     Conjunctiva/sclera: Conjunctivae normal.  Neck:     Thyroid : No  thyromegaly.  Cardiovascular:     Rate and Rhythm: Normal rate and regular rhythm.     Heart sounds: Normal heart sounds. No murmur heard. Pulmonary:     Effort: Pulmonary effort is normal. No respiratory distress.     Breath sounds: Normal breath sounds.  Abdominal:     General: Bowel sounds are normal.     Palpations: Abdomen is soft.     Tenderness: There is no abdominal tenderness. There is no guarding.  Musculoskeletal:        General: Normal range of motion.     Cervical back: Neck supple.  Lymphadenopathy:     Cervical: No cervical adenopathy.  Skin:    General: Skin is warm and dry.  Neurological:     Mental Status: She is alert and oriented to person, place, and time.  Psychiatric:        Mood and Affect: Mood normal.        Behavior: Behavior normal.        Thought Content: Thought content normal.        Judgment: Judgment normal.     BP 118/70   Pulse 72   Resp 16   Ht 5\' 5"  (1.651 m)   Wt 186 lb (84.4 kg)   LMP 10/03/2012   SpO2 97%   BMI 30.95 kg/m  Wt Readings from Last 3 Encounters:  11/28/23 186 lb (84.4 kg)  11/15/23 187 lb 3.2 oz (84.9 kg)  05/06/23 192 lb 4 oz (87.2 kg)    Diabetic Foot Exam - Simple   No data filed    Lab Results  Component Value Date   WBC 5.2 08/17/2023   HGB 13.8 08/17/2023   HCT 42.3 08/17/2023   PLT 222.0 08/17/2023   GLUCOSE 75 08/17/2023   CHOL 263 (H) 08/17/2023   TRIG 157.0 (H) 08/17/2023   HDL 42.90 08/17/2023   LDLDIRECT 80.0 01/11/2023   LDLCALC 188 (H) 08/17/2023   ALT 11 08/17/2023   AST 13 08/17/2023   NA 139 08/17/2023   K 4.5 08/17/2023   CL 105 08/17/2023   CREATININE 0.71 08/17/2023   BUN 12 08/17/2023   CO2 27 08/17/2023   TSH 1.29 08/17/2023   HGBA1C 5.6 08/17/2023    Lab Results  Component Value Date   TSH 1.29 08/17/2023   Lab Results  Component Value Date   WBC 5.2 08/17/2023   HGB 13.8 08/17/2023   HCT 42.3 08/17/2023   MCV 86.8 08/17/2023   PLT 222.0 08/17/2023   Lab  Results  Component Value Date   NA 139 08/17/2023   K 4.5 08/17/2023   CO2 27 08/17/2023   GLUCOSE 75 08/17/2023   BUN 12 08/17/2023   CREATININE 0.71 08/17/2023   BILITOT 0.6 08/17/2023   ALKPHOS 57 08/17/2023   AST 13 08/17/2023   ALT 11 08/17/2023   PROT 6.2 08/17/2023   ALBUMIN 4.2 08/17/2023   CALCIUM  8.9 08/17/2023   ANIONGAP 7 05/23/2021   GFR 89.10 08/17/2023   Lab Results  Component Value Date   CHOL 263 (H)  08/17/2023   Lab Results  Component Value Date   HDL 42.90 08/17/2023   Lab Results  Component Value Date   LDLCALC 188 (H) 08/17/2023   Lab Results  Component Value Date   TRIG 157.0 (H) 08/17/2023   Lab Results  Component Value Date   CHOLHDL 6 08/17/2023   Lab Results  Component Value Date   HGBA1C 5.6 08/17/2023       Assessment & Plan:  Hyperglycemia Assessment & Plan: hgba1c acceptable, minimize simple carbs. Increase exercise as tolerated.    Orders: -     Comprehensive metabolic panel with GFR -     Hemoglobin A1c -     TSH  Mixed hyperlipidemia Assessment & Plan: Encourage heart healthy diet such as MIND or DASH diet, increase exercise, avoid trans fats, simple carbohydrates and processed foods, consider a krill or fish or flaxseed oil cap daily.    Orders: -     Lipid panel  Muscle cramp Assessment & Plan: Hydrate and monitor   Orders: -     Magnesium   Insomnia, unspecified type Assessment & Plan: Encouraged good sleep hygiene such as dark, quiet room. No blue/green glowing lights such as computer screens in bedroom. No alcohol or stimulants in evening. Cut down on caffeine as able. Regular exercise is helpful but not just prior to bed time.     High risk medication use -     Drug Monitoring Panel [id number] , Urine  Allergy, sequela -     Ambulatory referral to Allergy  Cough in adult -     Ambulatory referral to Allergy -     CBC with Differential/Platelet -     TSH  Other orders -     Montelukast  Sodium; Take  1 tablet (10 mg total) by mouth at bedtime.  Dispense: 30 tablet; Refill: 3 -     Famotidine ; Take 1 tablet (40 mg total) by mouth at bedtime.  Dispense: 90 tablet; Refill: 3    Assessment and Plan Assessment & Plan Chronic cough with postnasal drip and GERD Cough likely due to allergies and GERD. Discussed allergy and GERD management, including potential allergist or pulmonologist referral if persistent. - Prescribe montelukast  once daily. - Advise loratadine 10 mg twice daily for two weeks. - Continue omeprazole  in the morning. - Start famotidine  at bedtime. - Encourage albuterol  inhaler as needed. - Refer to allergist for further evaluation. - Advise increasing water  intake. - Consider pulmonologist referral if no improvement.  Right hip pain, possible arthritis Pain likely arthritis-related. Discussed sports medicine consultation for further evaluation and management. - Advise alternating ice and heat application. - Recommend topical analgesics such as Voltaren  or lidocaine . - Consider referral to sports medicine if symptoms worsen or persist. - Discuss potential for x-ray if pain persists.  General Health Maintenance Due for second shingles vaccine. Encouraged pharmacy administration for convenience and cost-effectiveness. - Encourage receiving second shingles vaccine at a pharmacy.      Randie Bustle, MD

## 2023-11-29 ENCOUNTER — Ambulatory Visit: Payer: Self-pay | Admitting: Family Medicine

## 2023-11-29 LAB — LIPID PANEL
Cholesterol: 157 mg/dL (ref 0–200)
HDL: 49.2 mg/dL (ref >39.00)
LDL Cholesterol: 76 mg/dL (ref 0–99)
NonHDL: 107.41
Total CHOL/HDL Ratio: 3
Triglycerides: 158 mg/dL — ABNORMAL HIGH (ref 0.0–149.0)
VLDL: 31.6 mg/dL (ref 0.0–40.0)

## 2023-11-29 LAB — COMPREHENSIVE METABOLIC PANEL WITH GFR
ALT: 18 U/L (ref 0–35)
AST: 17 U/L (ref 0–37)
Albumin: 4.7 g/dL (ref 3.5–5.2)
Alkaline Phosphatase: 63 U/L (ref 39–117)
BUN: 14 mg/dL (ref 6–23)
CO2: 29 meq/L (ref 19–32)
Calcium: 9.7 mg/dL (ref 8.4–10.5)
Chloride: 101 meq/L (ref 96–112)
Creatinine, Ser: 0.82 mg/dL (ref 0.40–1.20)
GFR: 74.81 mL/min (ref >60.00)
Glucose, Bld: 80 mg/dL (ref 70–99)
Potassium: 3.9 meq/L (ref 3.5–5.1)
Sodium: 140 meq/L (ref 135–145)
Total Bilirubin: 0.8 mg/dL (ref 0.2–1.2)
Total Protein: 7 g/dL (ref 6.0–8.3)

## 2023-11-29 LAB — CBC WITH DIFFERENTIAL/PLATELET
Basophils Absolute: 0.1 10*3/uL (ref 0.0–0.1)
Basophils Relative: 1 % (ref 0.0–3.0)
Eosinophils Absolute: 0.1 10*3/uL (ref 0.0–0.7)
Eosinophils Relative: 1.6 % (ref 0.0–5.0)
HCT: 44.4 % (ref 36.0–46.0)
Hemoglobin: 14.7 g/dL (ref 12.0–15.0)
Lymphocytes Relative: 37.2 % (ref 12.0–46.0)
Lymphs Abs: 3 10*3/uL (ref 0.7–4.0)
MCHC: 33.2 g/dL (ref 30.0–36.0)
MCV: 86.4 fl (ref 78.0–100.0)
Monocytes Absolute: 0.5 10*3/uL (ref 0.1–1.0)
Monocytes Relative: 6 % (ref 3.0–12.0)
Neutro Abs: 4.4 10*3/uL (ref 1.4–7.7)
Neutrophils Relative %: 54.2 % (ref 43.0–77.0)
Platelets: 228 10*3/uL (ref 150.0–400.0)
RBC: 5.14 Mil/uL — ABNORMAL HIGH (ref 3.87–5.11)
RDW: 13.7 % (ref 11.5–15.5)
WBC: 8.1 10*3/uL (ref 4.0–10.5)

## 2023-11-29 LAB — MAGNESIUM: Magnesium: 2.1 mg/dL (ref 1.5–2.5)

## 2023-11-29 LAB — TSH: TSH: 1.22 u[IU]/mL (ref 0.35–5.50)

## 2023-11-29 LAB — HEMOGLOBIN A1C: Hgb A1c MFr Bld: 5.5 % (ref 4.6–6.5)

## 2023-11-30 LAB — DRUG MONITORING PANEL 376104, URINE
Alphahydroxyalprazolam: NEGATIVE ng/mL
Alphahydroxymidazolam: NEGATIVE ng/mL
Alphahydroxytriazolam: NEGATIVE ng/mL
Aminoclonazepam: NEGATIVE ng/mL
Amphetamines: NEGATIVE ng/mL
Barbiturates: NEGATIVE ng/mL
Benzodiazepines: POSITIVE ng/mL — AB
Cocaine Metabolite: NEGATIVE ng/mL
Desmethyltramadol: NEGATIVE ng/mL
Hydroxyethylflurazepam: NEGATIVE ng/mL
Lorazepam: 253 ng/mL — ABNORMAL HIGH
Nordiazepam: NEGATIVE ng/mL
Opiates: NEGATIVE ng/mL
Oxazepam: NEGATIVE ng/mL
Oxycodone: NEGATIVE ng/mL
Temazepam: NEGATIVE ng/mL
Tramadol: NEGATIVE ng/mL

## 2023-11-30 LAB — DM TEMPLATE

## 2023-12-09 ENCOUNTER — Other Ambulatory Visit: Payer: Self-pay | Admitting: Family Medicine

## 2023-12-12 ENCOUNTER — Other Ambulatory Visit: Payer: Self-pay | Admitting: Family Medicine

## 2023-12-15 ENCOUNTER — Encounter (INDEPENDENT_AMBULATORY_CARE_PROVIDER_SITE_OTHER): Payer: Self-pay

## 2023-12-28 NOTE — Progress Notes (Signed)
 New Patient Note  RE: [name] MRN: [id number] DOB: 1955/11/11 Date of Office Visit: 12/29/2023  Consult requested by: [name], MD Primary care provider: [name], MD  Chief Complaint: Cough (Symbicort  is too expensive), Allergic Rhinitis  (Sneezing, itchy eyes, itchy ears, congestion, runny nose,drainage. Has tried singulair  but was not effective. Previously on AIT), and Frequent Infections (Every October she gets sick and has happened at least 2 years in a row. )  History of Present Illness: I had the pleasure of seeing [name] for initial evaluation at the [facility] on 12/29/2023. She is a 68 y.o. female, who is referred here by [name], MD for the evaluation of cough and allergies, frequent infections.  Discussed the use of AI scribe software for clinical note transcription with the patient, who gave verbal consent to proceed.    She has experienced a cough, sneezing, watery and itchy eyes, itchy ears, and a stuffy nose for at least three years. Symptoms worsen in the fall, particularly after receiving her flu shot in September, and also in the spring. She identifies grass, weeds, and dust as potential triggers. No recent sinus infections.  She has tried various medications including Claritin, Mucinex , and Flonase  nasal spray. Mucinex  helps her nose run, while Claritin sometimes causes excessive dryness. She underwent allergy testing in 2004 and was on allergy shots for about seven years, which she found helpful, but had to stop due to insurance coverage issues.  She experiences ringing and a flushing sound in her ears and is scheduled to see an ear, nose, and throat specialist on July 14th. No history of asthma, but she uses a Ventolin  inhaler as needed, approximately once or twice a week, for a cough that sometimes produces yellow-tinged phlegm. She had a normal chest x-ray in the past.  She has a history of heartburn and reflux,  for which she takes over-the-counter omeprazole  20 mg, but finds it ineffective. No recent fevers, but she reports chills. She maintains a normal appetite and bowel habits.  She has a history of lymphedema in her left foot and ankle since she was 68 years old, which she manages with a diuretic and by elevating her leg. She reports numbness in her toes.  She has a [place] dog since 2016, which she keeps groomed and sleeps with her.     She reports symptoms of coughing, sneezing, itchy/watery eyes, nasal congestion. Symptoms have been worsening for 3 years. The symptoms are present all year around with worsening in fall and spring. Anosmia: no. Headache: yes. She has used claritin, singulair  mucinex , flonase  with some improvement in symptoms. Sinus infections: none. Previous work up includes: had allergy testing in the past in 2004 and was on AIT for about 7 years with good benefit. Previous ENT evaluation: going July 14th. Last eye exam: 1 year ago. History of reflux: yes - takes omeprazole  20mg  with minimal benefit.   11/15/2023 CXR: IMPRESSION: No active cardiopulmonary disease.  Assessment and Plan: [name] is a 68 y.o. female with: Other allergic rhinitis Allergic conjunctivitis of both eyes Chronic allergic rhinitis with exacerbations in fall and spring. Previous allergy shots were beneficial. Current medications include Claritin, Mucinex , and Flonase . Retesting considered due to persistent symptoms. Return for allergy skin testing. Will make additional recommendations based on results. Use Flonase  (fluticasone ) nasal spray 1-2 sprays per nostril once a day as needed for nasal congestion.  Nasal saline spray (i.e., Simply Saline) or nasal  saline lavage (i.e., NeilMed) is recommended as needed and prior to medicated nasal sprays.  Chronic cough Uses albuterol  1-2 times per week with good benefit. Cough is most likely multifactorial.  Today's spirometry was unremarkable. This inhaler  replaces your ventolin  (albuterol ) inhaler. May use Airsupra  rescue inhaler 2 puffs every 4 to 6 hours as needed for shortness of breath, chest tightness, coughing, and wheezing. Do not use more than 12 puffs in 24 hours. May use Airsupra  rescue inhaler 2 puffs 5 to 15 minutes prior to strenuous physical activities. Rinse mouth after each use.  Coupon given. Sample given. Monitor frequency of use - if you need to use it more than twice per week on a consistent basis let us  know.   Recurrent infections Keep track of infections and antibiotics use. If persistent will get bloodwork next to look at immune system.   Gastroesophageal reflux disease, unspecified whether esophagitis present Continue lifestyle and dietary modifications. Continue omeprazole  40mg  once day - nothing to eat or drink for 20-30 minutes afterwards.   Return for Skin testing.  Meds ordered this encounter  Medications   Albuterol -Budesonide  (AIRSUPRA ) 90-80 MCG/ACT AERO    Sig: Inhale 2 puffs into the lungs every 4 (four) hours as needed (coughing, wheezing, chest tightness). Do not exceed 12 puffs in 24 hours.    Dispense:  10.7 g    Refill:  2    BIN: [id number], PCN: [id number], GRP: [id number], ID [id number]   Lab Orders  No laboratory test(s) ordered today    Other allergy screening: Asthma:  Couldn't afford Symbicort . Uses ventolin  prn about 1-2 times per week.   Food allergy: no Medication allergy: yes Hymenoptera allergy: no Urticaria: no Eczema:no Has some type of rash and had skin biopsy in the past.  Diagnostics: Spirometry:  Tracings reviewed. Her effort: Good reproducible efforts. FVC: 2.36L FEV1: 2.21L, 94% predicted FEV1/FVC ratio: 94% Interpretation: No overt abnormalities noted given today's efforts.  Please see scanned spirometry results for details.  Results discussed with patient/family.   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/2021   GAD (generalized anxiety disorder) 05/19/2021   PTSD (post-traumatic stress disorder) 05/19/2021   Adjustment disorder with mixed anxiety and depressed mood 05/19/2021   Dense breast tissue 06/24/2020   Tinea corporis 04/26/2017   Muscle cramp 03/20/2017   Hyperglycemia 04/26/2015   Arterial hypotension 04/26/2015   Postmenopausal estrogen deficiency 04/26/2015   Other fatigue 04/26/2015   Myalgia 02/15/2015   Pain in joint, shoulder region 01/22/2015   Sleep apnea 04/21/2014   AP (abdominal pain) 04/16/2014   Aortic insufficiency 12/19/2013   Atypical chest pain 08/26/2013   Urinary frequency 08/26/2013   GERD (gastroesophageal reflux disease) 08/26/2013   Subacromial bursitis 06/21/2013   Arthritis 06/09/2013   Constipation 05/13/2013   Insomnia 03/11/2013   Perimenopause 03/11/2013   Hyperlipidemia 05/02/2012   Hip pain, left 11/01/2011   RUQ pain 09/20/2011   Onychomycosis 05/09/2011   Depression with anxiety 05/09/2011   Preventative health care 05/09/2011   Low back pain 05/09/2011   Allergy    History of proteinuria syndrome    Edema of extremities    RLS (restless legs syndrome)    Dyspareunia    Obese    Past Medical History:  Diagnosis Date   Acute sinusitis 03/27/2013   Allergy    seasonal  Anxiety    Arthritis 06/09/2013   Depression    Dyspareunia    Edema extremities    Encounter for preventative adult health care exam with abnormal findings 04/21/2014   GERD (gastroesophageal reflux disease)    Hip pain, left 11/01/2011   History of proteinuria syndrome    Hyperlipidemia    Insomnia 03/11/2013   Low back pain 05/09/2011   Lymphadenitis 06/16/2012   Myalgia and myositis 02/15/2015   Obese    Onychomycosis 05/09/2011   Oral lesion 08/26/2013   Otitis externa 06/23/2012   left   Perimenopause 03/11/2013   RLS (restless  legs syndrome)    RUQ pain 09/20/2011   Sleep apnea 04/21/2014   Tinea corporis 04/26/2017   Unspecified constipation 05/13/2013   Urinary frequency 08/26/2013   Past Surgical History: Past Surgical History:  Procedure Laterality Date   BACK SURGERY  2006   low, discectomy for herniated disc   BLADDER SUSPENSION N/A 11/07/2012   Procedure: [place] Day SLING;  Surgeon: Norleen JINNY Seltzer, MD;  Location: [facility];  Service: Urology;  Laterality: N/A;   CARPAL TUNNEL RELEASE Right 2006   CESAREAN SECTION  1993   CYSTOSCOPY N/A 11/07/2012   Procedure: CYSTOSCOPY;  Surgeon: Norleen JINNY Seltzer, MD;  Location: [facility];  Service: Urology;  Laterality: N/A;   TUBAL LIGATION  1993   Medication List:  Current Outpatient Medications  Medication Sig Dispense Refill   acetaminophen  (TYLENOL ) 500 MG tablet Take 1,000 mg by mouth every 6 (six) hours as needed for headache or mild pain.     albuterol  (VENTOLIN  HFA) 108 (90 Base) MCG/ACT inhaler INHALE 1 - 2 PUFFS INTO THE LUNGS EVERY 4 HOURS AS NEEDED FOR COUGH, WHEEZING, OR CHEST TIGHTNESS 18 g 3   Albuterol -Budesonide  (AIRSUPRA ) 90-80 MCG/ACT AERO Inhale 2 puffs into the lungs every 4 (four) hours as needed (coughing, wheezing, chest tightness). Do not exceed 12 puffs in 24 hours. 10.7 g 2   Ascorbic Acid (VITAMIN C PO) Take 1 tablet by mouth daily.     Bacillus Coagulans-Inulin (PROBIOTIC) 1-250 BILLION-MG CAPS Take by mouth.     baclofen  (LIORESAL ) 20 MG tablet Take 1 tablet (20 mg total) by mouth at bedtime as needed for muscle spasms. 90 tablet 1   buPROPion  (WELLBUTRIN  XL) 150 MG 24 hr tablet Take 1 tablet (150 mg total) by mouth every morning. 90 tablet 1   busPIRone  (BUSPAR ) 7.5 MG tablet Take 1 tablet (7.5 mg total) by mouth 2 (two) times daily. 180 tablet 0   famotidine  (PEPCID ) 40 MG tablet Take 1 tablet (40 mg total) by mouth at bedtime. 90 tablet 3   fluticasone  (FLONASE ) 50 MCG/ACT nasal spray Place 2 sprays into both  nostrils daily as needed for allergies. 16 g 1   hydrOXYzine  (VISTARIL ) 25 MG capsule Take 1 capsule (25 mg total) by mouth daily. 90 capsule 1   LORazepam  (ATIVAN ) 0.5 MG tablet TAKE 1 TABLET BY MOUTH DAILY AS NEEDED FOR SEVERE ANXIETY 30 tablet 1   omeprazole  (PRILOSEC) 40 MG capsule TAKE 1 CAPSULE (40 MG TOTAL) BY MOUTH DAILY. 90 capsule 1   potassium chloride  SA (KLOR-CON  M) 20 MEQ tablet TAKE 1 TABLET BY MOUTH DAILY. TAKE 2ND TABLET DAILY AS NEEDED 180 tablet 0   promethazine  (PHENERGAN ) 12.5 MG tablet TAKE 1 TABLET(12.5 MG) BY MOUTH EVERY 8 HOURS AS NEEDED FOR NAUSEA OR VOMITING 30 tablet 5   rOPINIRole  (REQUIP ) 1 MG tablet Take 1 tablet by mouth  at bedtime 90 tablet 1   rosuvastatin  (CRESTOR ) 40 MG tablet Take 1 tablet (40 mg total) by mouth daily. 90 tablet 0   torsemide  (DEMADEX ) 20 MG tablet Take 1 tablet (20 mg total) by mouth daily as needed. 90 tablet 1   traZODone  (DESYREL ) 50 MG tablet Take 1-2 tablets (50-100 mg total) by mouth at bedtime as needed for sleep. 120 tablet 1   VITAMIN D PO Take 1 tablet by mouth daily.     No current facility-administered medications for this visit.   Allergies: Allergies  Allergen Reactions   Elemental Sulfur Diarrhea and Nausea And Vomiting   Carbamazepine Hives   Ceclor [Cefaclor] Other (See Comments)    Tongue swell   Morphine  And Codeine  Itching   Atorvastatin  Other (See Comments)    Leg pain   Social History: Social History   Socioeconomic History   Marital status: Divorced    Spouse name: Not on file   Number of children: Not on file   Years of education: Not on file   Highest education level: Not on file  Occupational History   Not on file  Tobacco Use   Smoking status: Former    Current packs/day: 0.00    Average packs/day: 0.5 packs/day for 20.0 years (10.0 ttl pk-yrs)    Types: Cigarettes    Start date: 07/12/1972    Quit date: 07/12/1992    Years since quitting: 31.4   Smokeless tobacco: Never  Vaping Use   Vaping  status: Never Used  Substance and Sexual Activity   Alcohol use: Not Currently   Drug use: No   Sexual activity: Yes    Partners: Male  Other Topics Concern   Not on file  Social History Narrative   Not on file   Social Drivers of Health   Financial Resource Strain: Medium Risk (10/14/2023)   Overall Financial Resource Strain (CARDIA)    Difficulty of Paying Living Expenses: Somewhat hard  Food Insecurity: No Food Insecurity (10/14/2023)   Hunger Vital Sign    Worried About Running Out of Food in the Last Year: Never true    Ran Out of Food in the Last Year: Never true  Transportation Needs: No Transportation Needs (10/14/2023)   PRAPARE - Administrator, Civil Service (Medical): No    Lack of Transportation (Non-Medical): No  Physical Activity: Sufficiently Active (10/14/2023)   Exercise Vital Sign    Days of Exercise per Week: 7 days    Minutes of Exercise per Session: 30 min  Stress: No Stress Concern Present (10/14/2023)   [employer] of Occupational Health - Occupational Stress Questionnaire    Feeling of Stress : Only a little  Social Connections: Moderately Isolated (10/14/2023)   Social Connection and Isolation Panel    Frequency of Communication with Friends and Family: Once a week    Frequency of Social Gatherings with Friends and Family: Once a week    Attends Religious Services: More than 4 times per year    Active Member of [employer] or Organizations: Yes    Attends Engineer, structural: More than 4 times per year    Marital Status: Divorced   Lives in a townhouse. Smoking: smoked from 1970-1992 Occupation: not employed  Environmental History: Water  Damage/mildew in the house: no Carpet in the family room: no Carpet in the bedroom: yes Heating: gas Cooling: central Pet: yes 1 dog x 9 yrs  Family History: Family History  Problem Relation Age of Onset  Cancer Mother 50       thyroid , malignant brain tumor   Arthritis Mother        neck,  back   Arthritis Father        back pain   Heart disease Father        stent   Hepatitis Sister        Hepatitis C   Heart disease Sister        stent   Breast cancer Maternal Aunt    Colon polyps Maternal Grandmother    Diabetes Maternal Grandmother    Colon cancer Neg Hx    Rectal cancer Neg Hx    Stomach cancer Neg Hx    Problem                               Relation Asthma                                   no Eczema                                no Food allergy                          no Allergic rhino conjunctivitis     son  Review of Systems  Constitutional:  Positive for chills. Negative for appetite change, fever and unexpected weight change.  HENT:  Positive for congestion. Negative for rhinorrhea.   Eyes:  Negative for itching.  Respiratory:  Positive for cough. Negative for chest tightness, shortness of breath and wheezing.   Cardiovascular:  Negative for chest pain.  Gastrointestinal:  Negative for abdominal pain.  Genitourinary:  Negative for difficulty urinating.  Skin:  Positive for rash.  Neurological:  Positive for headaches.    Objective: BP 102/64   Pulse (!) 58   Temp 98 F (36.7 C)   Resp 16   Ht 5' 4.17 (1.63 m)   Wt 186 lb (84.4 kg)   LMP 10/03/2012   SpO2 95%   BMI 31.75 kg/m  Body mass index is 31.75 kg/m. Physical Exam Vitals and nursing note reviewed.  Constitutional:      Appearance: Normal appearance. She is well-developed.  HENT:     Head: Normocephalic and atraumatic.     Right Ear: Tympanic membrane and external ear normal.     Left Ear: Tympanic membrane and external ear normal.     Nose: Nose normal.     Mouth/Throat:     Mouth: Mucous membranes are moist.     Pharynx: Oropharynx is clear.   Eyes:     Conjunctiva/sclera: Conjunctivae normal.    Cardiovascular:     Rate and Rhythm: Normal rate and regular rhythm.     Heart sounds: Normal heart sounds. No murmur heard.    No friction rub. No gallop.  Pulmonary:      Effort: Pulmonary effort is normal.     Breath sounds: Normal breath sounds. No wheezing, rhonchi or rales.   Musculoskeletal:     Cervical back: Neck supple.     Left lower leg: Edema (chronic) present.   Skin:    General: Skin is warm.     Findings: No rash.   Neurological:     Mental  Status: She is alert and oriented to person, place, and time.   Psychiatric:        Behavior: Behavior normal.   The plan was reviewed with the patient/family, and all questions/concerned were addressed.  It was my pleasure to see [name] today and participate in her care. Please feel free to contact me with any questions or concerns.  Sincerely,  Orlan Cramp, DO Allergy & Immunology  [facility]  [place] office: [phone] [facility] office: [phone]

## 2023-12-29 ENCOUNTER — Encounter: Payer: Self-pay | Admitting: Allergy

## 2023-12-29 ENCOUNTER — Ambulatory Visit: Admitting: Allergy

## 2023-12-29 VITALS — BP 102/64 | HR 58 | Temp 98.0°F | Resp 16 | Ht 64.17 in | Wt 186.0 lb

## 2023-12-29 DIAGNOSIS — R053 Chronic cough: Secondary | ICD-10-CM

## 2023-12-29 DIAGNOSIS — H1013 Acute atopic conjunctivitis, bilateral: Secondary | ICD-10-CM

## 2023-12-29 DIAGNOSIS — K219 Gastro-esophageal reflux disease without esophagitis: Secondary | ICD-10-CM | POA: Diagnosis not present

## 2023-12-29 DIAGNOSIS — B999 Unspecified infectious disease: Secondary | ICD-10-CM

## 2023-12-29 DIAGNOSIS — J3089 Other allergic rhinitis: Secondary | ICD-10-CM | POA: Diagnosis not present

## 2023-12-29 MED ORDER — AIRSUPRA 90-80 MCG/ACT IN AERO: 2.0000 | INHALATION_SPRAY | RESPIRATORY_TRACT | 2 refills | Status: DC | PRN

## 2023-12-29 NOTE — Patient Instructions (Addendum)
 Rhinitis  Return for allergy skin testing. Will make additional recommendations based on results. Make sure you don't take any antihistamines for 3 days before the skin testing appointment. Don't put any lotion on the back and arms on the day of testing.  Plan on being here for 30-60 minutes. Use Flonase  (fluticasone ) nasal spray 1-2 sprays per nostril once a day as needed for nasal congestion.  Nasal saline spray (i.e., Simply Saline) or nasal saline lavage (i.e., NeilMed) is recommended as needed and prior to medicated nasal sprays.  Coughing This inhaler replaces your ventolin  (albuterol ) inhaler.  May use Airsupra  rescue inhaler 2 puffs every 4 to 6 hours as needed for shortness of breath, chest tightness, coughing, and wheezing. Do not use more than 12 puffs in 24 hours. May use Airsupra  rescue inhaler 2 puffs 5 to 15 minutes prior to strenuous physical activities. Rinse mouth after each use.  Coupon given. Sample given. Monitor frequency of use - if you need to use it more than twice per week on a consistent basis let us  know.   Infections Keep track of infections and antibiotics use. If persistent will get bloodwork next to look at immune system.   GERD Continue lifestyle and dietary modifications. Continue omepreazole 40mg  once day - nothing to eat or drink for 20-30 minutes afterwards.   Follow up for skin testing.

## 2023-12-31 ENCOUNTER — Encounter: Payer: Self-pay | Admitting: Allergy

## 2024-01-04 ENCOUNTER — Other Ambulatory Visit (HOSPITAL_BASED_OUTPATIENT_CLINIC_OR_DEPARTMENT_OTHER): Payer: Self-pay

## 2024-01-11 NOTE — Progress Notes (Unsigned)
 Skin testing note  RE: [name] MRN: [id number] DOB: [birth year] Date of Office Visit: 01/12/2024  Referring provider: [name], MD Primary care provider: [name], MD  Chief Complaint: skin testing  History of Present Illness: I had the pleasure of seeing [name] for a skin testing visit at the [facility] on 01/12/2024. She is a 68 y.o. female, who is being followed for allergic rhino conjunctivitis, cough, recurrent infections, GERD. Her previous allergy office visit was on 12/29/2023 with Dr. Luke. Today is a skin testing visit.   Discussed the use of AI scribe software for clinical note transcription with the patient, who gave verbal consent to proceed.    She recently underwent allergy testing, which showed no positive results. She has a history of being on allergy shots in the past.  She is scheduled to see an ear, nose, and throat specialist on July 14th for tinnitus.   There is an issue with her pharmacy regarding the medication Airsupra . She believes the prescription was sent but not received by the pharmacy. Requesting printout Rx.      Assessment and Plan: [name] is a 68 y.o. female with: Other allergic rhinitis Allergic conjunctivitis of both eyes Past history - Chronic allergic rhinitis with exacerbations in fall and spring. Previous allergy shots were beneficial. Current medications include Claritin, Mucinex , and Flonase .  Today's skin testing negative to indoor/outdoor allergens.  Use Flonase  (fluticasone ) nasal spray 1-2 sprays per nostril once a day as needed for nasal congestion.  Nasal saline spray (i.e., Simply Saline) or nasal saline lavage (i.e., NeilMed) is recommended as needed and prior to medicated nasal sprays. Keep ENT appointment.  Get bloodwork - to double check for environmental allergies.    Chronic cough Past history - Uses albuterol  1-2 times per week with good benefit. Cough is most likely  multifactorial. 2025 spirometry was unremarkable. May use Airsupra  rescue inhaler 2 puffs every 4 to 6 hours as needed for shortness of breath, chest tightness, coughing, and wheezing. Do not use more than 12 puffs in 24 hours. May use Airsupra  rescue inhaler 2 puffs 5 to 15 minutes prior to strenuous physical activities. Rinse mouth after each use.  Monitor frequency of use - if you need to use it more than twice per week on a consistent basis let us  know.  Rx printed out.   Recurrent infections Keep track of infections and antibiotics use. Get bloodwork next to look at immune system.    Gastroesophageal reflux disease, unspecified whether esophagitis present Continue lifestyle and dietary modifications. Continue omeprazole  40mg  once day - nothing to eat or drink for 20-30 minutes afterwards.   Return in about 4 months (around 05/14/2024).  Meds ordered this encounter  Medications   Albuterol -Budesonide  (AIRSUPRA ) 90-80 MCG/ACT AERO    Sig: Inhale 2 puffs into the lungs every 4 (four) hours as needed (coughing, wheezing, chest tightness). Do not exceed 12 puffs in 24 hours.    Dispense:  10.7 g    Refill:  2    BIN: [id number], PCN: [id number], GRP: [id number], ID [id number]   Lab Orders         Allergens w/Total IgE Area 2         Complement, total         IgG, IgA, IgM         Strep pneumoniae 23 Serotypes IgG         Diphtheria / Tetanus Antibody Panel  Diagnostics: Skin Testing: Environmental allergy panel. Today's skin testing negative to indoor/outdoor allergens.  Results discussed with patient/family.  Airborne Adult Perc - 01/12/24 0924     Time Antigen Placed 0930    Allergen Manufacturer Jestine    Location Back    Number of Test 55    1. Control-Buffer 50% Glycerol Negative    2. Control-Histamine 2+    3. Bahia Negative    4. French Southern Territories Negative    5. Johnson Negative    6. [place]  Blue Negative    7. Meadow Fescue Negative    8. Perennial Rye Negative    9. Timothy  Negative    10. Ragweed Mix Negative    11. Cocklebur Negative    12. Plantain,  English Negative    13. Baccharis Negative    14. Dog Fennel Negative    15. Russian Thistle Negative    16. Lamb's Quarters Negative    17. Sheep Sorrell Negative    18. Rough Pigweed Negative    19. Marsh Elder, Rough Negative    20. Mugwort, Common Negative    21. Box, Elder Negative    22. Cedar, red Negative    23. Sweet Gum Negative    24. Pecan Pollen Negative    25. Pine Mix Negative    26. Walnut, Black Pollen Negative    27. Red Mulberry Negative    28. Ash Mix Negative    29. Birch Mix Negative    30. Beech American Negative    31. Cottonwood, Guinea-Bissau Negative    32. Hickory, White Negative    33. Maple Mix Negative    34. Oak, [place] Mix Negative    35. Sycamore Eastern Negative    36. Alternaria Alternata Negative    37. Cladosporium Herbarum Negative    38. Aspergillus Mix Negative    39. Penicillium Mix Negative    40. Bipolaris Sorokiniana (Helminthosporium) Negative    41. Drechslera Spicifera (Curvularia) Negative    42. Mucor Plumbeus Negative    43. Fusarium Moniliforme Negative    44. Aureobasidium Pullulans (pullulara) Negative    45. Rhizopus Oryzae Negative    46. Botrytis Cinera Negative    47. Epicoccum Nigrum Negative    48. Phoma Betae Negative    49. Dust Mite Mix Negative    50. Cat Hair 10,000 BAU/ml Negative    51.  Dog Epithelia Negative    52. Mixed Feathers Negative    53. Horse Epithelia Negative    54. Cockroach, German Negative    55. Tobacco Leaf Negative          Intradermal - 01/12/24 1028     Control 2+    Brunei [place]alam Negative    French Southern Territories Negative    Johnson Negative    7 Grass Negative    Ragweed Mix Negative    Weed Mix Negative    Tree Mix Negative    Mold 1 Negative    Mold 2 Negative    Mold 3 Negative    Mold 4 Negative    Mite Mix Negative    Cat Negative    Dog Negative    Cockroach Negative    Other Negative           Previous notes and tests were reviewed. The plan was reviewed with the patient/family, and all questions/concerned were addressed.  It was my pleasure to see [name] today and participate in her care. Please feel free to contact me with any  questions or concerns.  Sincerely,  Orlan Cramp, DO Allergy & Immunology  [facility]  [place] office: [phone] [facility] office: [phone]

## 2024-01-12 ENCOUNTER — Ambulatory Visit: Admitting: Allergy

## 2024-01-12 ENCOUNTER — Encounter: Payer: Self-pay | Admitting: Allergy

## 2024-01-12 DIAGNOSIS — J3089 Other allergic rhinitis: Secondary | ICD-10-CM

## 2024-01-12 DIAGNOSIS — B999 Unspecified infectious disease: Secondary | ICD-10-CM

## 2024-01-12 DIAGNOSIS — R053 Chronic cough: Secondary | ICD-10-CM

## 2024-01-12 DIAGNOSIS — H1013 Acute atopic conjunctivitis, bilateral: Secondary | ICD-10-CM

## 2024-01-12 DIAGNOSIS — K219 Gastro-esophageal reflux disease without esophagitis: Secondary | ICD-10-CM

## 2024-01-12 MED ORDER — AIRSUPRA 90-80 MCG/ACT IN AERO: 2.0000 | INHALATION_SPRAY | RESPIRATORY_TRACT | 2 refills | Status: DC | PRN

## 2024-01-12 NOTE — Patient Instructions (Addendum)
 Today's skin testing negative to indoor/outdoor allergens.   Results given.  Rhinitis  Use Flonase  (fluticasone ) nasal spray 1-2 sprays per nostril once a day as needed for nasal congestion.  Nasal saline spray (i.e., Simply Saline) or nasal saline lavage (i.e., NeilMed) is recommended as needed and prior to medicated nasal sprays. Keep ENT appointment.  Get bloodwork - to double check for environmental allergies.   Coughing May use Airsupra  rescue inhaler 2 puffs every 4 to 6 hours as needed for shortness of breath, chest tightness, coughing, and wheezing. Do not use more than 12 puffs in 24 hours. May use Airsupra  rescue inhaler 2 puffs 5 to 15 minutes prior to strenuous physical activities. Rinse mouth after each use.  Monitor frequency of use - if you need to use it more than twice per week on a consistent basis let us  know.   Infections Keep track of infections and antibiotics use. Get bloodwork next to look at immune system.   GERD Continue lifestyle and dietary modifications. Continue omepreazole 40mg  once day - nothing to eat or drink for 20-30 minutes afterwards.   Follow up in 4 months or sooner if needed

## 2024-01-18 DIAGNOSIS — B999 Unspecified infectious disease: Secondary | ICD-10-CM | POA: Diagnosis not present

## 2024-01-18 DIAGNOSIS — J3089 Other allergic rhinitis: Secondary | ICD-10-CM | POA: Diagnosis not present

## 2024-01-19 ENCOUNTER — Telehealth: Payer: Self-pay

## 2024-01-19 NOTE — Telephone Encounter (Signed)
*  AA  Pharmacy Patient Advocate Encounter   Received notification from CoverMyMeds that prior authorization for Airsupra  90-80MCG/ACT aerosol  is required/requested.   Insurance verification completed.   The patient is insured through [facility] .   Per test claim: PA required; PA submitted to above mentioned insurance via CoverMyMeds Key/confirmation #/EOC [id number] Status is pending

## 2024-01-19 NOTE — Telephone Encounter (Signed)
 Patient called the office stating that a PA is needed for the Airsupra  inhaler. Please initiate PA.

## 2024-01-20 MED ORDER — ALBUTEROL SULFATE HFA 108 (90 BASE) MCG/ACT IN AERS: 2.0000 | INHALATION_SPRAY | RESPIRATORY_TRACT | 1 refills | Status: AC | PRN

## 2024-01-20 NOTE — Telephone Encounter (Signed)
 K sent in albuterol .

## 2024-01-20 NOTE — Telephone Encounter (Signed)
 PA request has been Received. New Encounter has been or will be created for follow up. For additional info see Pharmacy Prior Auth telephone encounter from 07/10.

## 2024-01-20 NOTE — Addendum Note (Signed)
 Addended by: Leilanni Halvorson M on: 01/20/2024 01:42 PM   Modules accepted: Orders

## 2024-01-20 NOTE — Telephone Encounter (Signed)
 Airsupra  was denied for chronic cough. Please advise on switching back to albuterol .    - Airsupra  rescue inhaler 2 puffs every 4 to 6 hours as needed for shortness of breath, chest tightness, coughing, and wheezing.

## 2024-01-23 ENCOUNTER — Ambulatory Visit (INDEPENDENT_AMBULATORY_CARE_PROVIDER_SITE_OTHER): Admitting: Audiology

## 2024-01-23 ENCOUNTER — Encounter (INDEPENDENT_AMBULATORY_CARE_PROVIDER_SITE_OTHER): Payer: Self-pay | Admitting: Physician Assistant

## 2024-01-23 ENCOUNTER — Ambulatory Visit (INDEPENDENT_AMBULATORY_CARE_PROVIDER_SITE_OTHER): Admitting: Physician Assistant

## 2024-01-23 VITALS — BP 99/60 | HR 60 | Ht 64.0 in | Wt 186.0 lb

## 2024-01-23 DIAGNOSIS — H903 Sensorineural hearing loss, bilateral: Secondary | ICD-10-CM

## 2024-01-23 DIAGNOSIS — H93A9 Pulsatile tinnitus, unspecified ear: Secondary | ICD-10-CM

## 2024-01-23 DIAGNOSIS — H9041 Sensorineural hearing loss, unilateral, right ear, with unrestricted hearing on the contralateral side: Secondary | ICD-10-CM

## 2024-01-23 NOTE — Progress Notes (Signed)
  [address] [place], [place] [address] [phone]  Audiological Evaluation    Name: ELBIA PARO     DOB:   01/09/1956      MRN:   [id number]                                                                                     Service Date: 01/23/2024     Accompanied by: unaccompanied   Patient comes today after Reyes Cohen, PA-C sent a referral for a hearing evaluation due to concerns with tinnitus.   Symptoms Yes Details  Hearing loss  []  Maybe some due to age, per patient.  Tinnitus  [x]  Right ear - onset 3 years ago - described as constant high pitched sound and sometimes a swooshing sound.   Ear pain/ infections/pressure  []    Balance problems  []    Noise exposure history  []    Previous ear surgeries  []    Family history of hearing loss  []    Amplification  []    Other  []      Otoscopy: Right ear: Clear external ear canal and notable landmarks visualized on the tympanic membrane. Left ear:  Clear external ear canal and notable landmarks visualized on the tympanic membrane.  Tympanometry: Right ear: Type A- Normal external ear canal volume with normal middle ear pressure and tympanic membrane compliance. Left ear: Type A- Normal external ear canal volume with normal middle ear pressure and tympanic membrane compliance.    Pure tone Audiometry: Right ear- Normal hearing from [phone], then borderline normal to mild sensorineural hearing loss notch from 3000 Hz - 6000 Hz, then rising to normal at 8000 Hz.  Left ear-  Normal hearing from [phone].    Speech Audiometry: Right ear- Speech Reception Threshold (SRT) was obtained at 10 dBHL. Left ear-Speech Reception Threshold (SRT) was obtained at 10 dBHL.   Word Recognition Score Tested using NU-6 (recorded) Right ear: 100% was obtained at a presentation level of 55 dBHL with contralateral masking which is deemed as  excellent. Left ear: 100% was obtained at a presentation level of 55 dBHL with  contralateral masking which is deemed as  excellent.   The hearing test results were completed under headphones and re-checked with inserts and results are deemed to be of good reliability. Test technique:  conventional    Impression: There is a hearing asymmetry, worse in the right ear from 2000-6000 Hz.  Recommendations: Follow up with ENT due to right hearing asymmetry and unilateral tinnitus. Return for a hearing evaluation in 1 year, before if concerns with hearing changes arise or per MD recommendation. Consider various tinnitus strategies, including the use of a sound generator, hearing aids, and/or tinnitus retraining therapy.    Hershall Benkert MARIE LEROUX-MARTINEZ, AUD

## 2024-01-23 NOTE — Progress Notes (Unsigned)
 Dear Dr. Domenica, Here is my assessment for our mutual patient, [name]. Thank you for allowing me the opportunity to care for your patient. Please do not hesitate to contact me should you have any other questions. Sincerely, Chyrl Cohen PA-C  Otolaryngology Clinic Note Referring provider: Dr. Domenica HPI:  [name] is a 68 y.o. female kindly referred by Dr. Domenica   The patient is a 68 year old female seen our office for evaluation of tinnitus.  Patient notes over the last 2 to 3 years she has had tinnitus in the right ear.  She describes both tonal tinnitus as well as pulsatile tinnitus.  She notes the tonal tinnitus is constant.  She describes the tinnitus as rhythmic sounding like the ocean in her ear, she notes this is more prevalent at night and is not consistent throughout the day.  She denies any associated pain, she feels like her hearing is not quite as good in the right ear when compared to the left.  She does note a history of otitis externa as a child but no history recurrent otitis media.  No trauma to the ear, no previous history.  No associated dizziness.,  No neurologic deficits.   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, bilateral type A tympanometry  PMH/Meds/All/SocHx/FamHx/ROS:   Past Medical History:  Diagnosis Date   Acute sinusitis 03/27/2013   Allergy     seasonal   Anxiety    Arthritis 06/09/2013   Depression    Dyspareunia    Edema extremities    Encounter for preventative adult health care exam with abnormal findings 04/21/2014   GERD (gastroesophageal reflux disease)    Hip pain, left 11/01/2011   History of proteinuria syndrome    Hyperlipidemia    Insomnia 03/11/2013   Low back pain 05/09/2011   Lymphadenitis 06/16/2012   Myalgia and myositis 02/15/2015   Obese    Onychomycosis 05/09/2011   Oral lesion 08/26/2013    Otitis externa 06/23/2012   left   Perimenopause 03/11/2013   RLS (restless legs syndrome)    RUQ pain 09/20/2011   Sleep apnea 04/21/2014   Tinea corporis 04/26/2017   Unspecified constipation 05/13/2013   Urinary frequency 08/26/2013     Past Surgical History:  Procedure Laterality Date   BACK SURGERY  2006   low, discectomy for herniated disc   BLADDER SUSPENSION N/A 11/07/2012   Procedure: [facility] SLING;  Surgeon: Norleen JINNY Seltzer, MD;  Location: [facility];  Service: Urology;  Laterality: N/A;   CARPAL TUNNEL RELEASE Right 2006   CESAREAN SECTION  1993   CYSTOSCOPY N/A 11/07/2012   Procedure: CYSTOSCOPY;  Surgeon: Norleen JINNY Seltzer, MD;  Location: [facility];  Service: Urology;  Laterality: N/A;   TUBAL LIGATION  1993    Family History  Problem Relation Age of Onset   Cancer Mother 13       thyroid , malignant brain tumor   Arthritis Mother        neck, back   Arthritis Father        back pain   Heart disease Father        stent   Hepatitis Sister        Hepatitis C   Heart disease Sister        stent   Breast cancer Maternal Aunt    Colon polyps Maternal Grandmother    Diabetes  Maternal Grandmother    Colon cancer Neg Hx    Rectal cancer Neg Hx    Stomach cancer Neg Hx      Social Connections: Moderately Isolated (10/14/2023)   Social Connection and Isolation Panel    Frequency of Communication with Friends and Family: Once a week    Frequency of Social Gatherings with Friends and Family: Once a week    Attends Religious Services: More than 4 times per year    Active Member of [employer] or Organizations: Yes    Attends Engineer, structural: More than 4 times per year    Marital Status: Divorced      Current Outpatient Medications:    acetaminophen  (TYLENOL ) 500 MG tablet, Take 1,000 mg by mouth every 6 (six) hours as needed for headache or mild pain., Disp: , Rfl:    albuterol  (VENTOLIN  HFA) 108 (90 Base) MCG/ACT inhaler, Inhale 2  puffs into the lungs every 4 (four) hours as needed for wheezing or shortness of breath (coughing fits)., Disp: 18 g, Rfl: 1   Ascorbic Acid (VITAMIN C PO), Take 1 tablet by mouth daily., Disp: , Rfl:    Bacillus Coagulans-Inulin (PROBIOTIC) 1-250 BILLION-MG CAPS, Take by mouth., Disp: , Rfl:    baclofen  (LIORESAL ) 20 MG tablet, Take 1 tablet (20 mg total) by mouth at bedtime as needed for muscle spasms., Disp: 90 tablet, Rfl: 1   buPROPion  (WELLBUTRIN  XL) 150 MG 24 hr tablet, Take 1 tablet (150 mg total) by mouth every morning., Disp: 90 tablet, Rfl: 1   busPIRone  (BUSPAR ) 7.5 MG tablet, Take 1 tablet (7.5 mg total) by mouth 2 (two) times daily., Disp: 180 tablet, Rfl: 0   famotidine  (PEPCID ) 40 MG tablet, Take 1 tablet (40 mg total) by mouth at bedtime., Disp: 90 tablet, Rfl: 3   fluticasone  (FLONASE ) 50 MCG/ACT nasal spray, Place 2 sprays into both nostrils daily as needed for allergies., Disp: 16 g, Rfl: 1   hydrOXYzine  (VISTARIL ) 25 MG capsule, Take 1 capsule (25 mg total) by mouth daily., Disp: 90 capsule, Rfl: 1   LORazepam  (ATIVAN ) 0.5 MG tablet, TAKE 1 TABLET BY MOUTH DAILY AS NEEDED FOR SEVERE ANXIETY, Disp: 30 tablet, Rfl: 1   omeprazole  (PRILOSEC) 40 MG capsule, TAKE 1 CAPSULE (40 MG TOTAL) BY MOUTH DAILY., Disp: 90 capsule, Rfl: 1   potassium chloride  SA (KLOR-CON  M) 20 MEQ tablet, TAKE 1 TABLET BY MOUTH DAILY. TAKE 2ND TABLET DAILY AS NEEDED, Disp: 180 tablet, Rfl: 0   promethazine  (PHENERGAN ) 12.5 MG tablet, TAKE 1 TABLET(12.5 MG) BY MOUTH EVERY 8 HOURS AS NEEDED FOR NAUSEA OR VOMITING, Disp: 30 tablet, Rfl: 5   rOPINIRole  (REQUIP ) 1 MG tablet, Take 1 tablet by mouth at bedtime, Disp: 90 tablet, Rfl: 1   rosuvastatin  (CRESTOR ) 40 MG tablet, Take 1 tablet (40 mg total) by mouth daily., Disp: 90 tablet, Rfl: 0   torsemide  (DEMADEX ) 20 MG tablet, Take 1 tablet (20 mg total) by mouth daily as needed., Disp: 90 tablet, Rfl: 1   traZODone  (DESYREL ) 50 MG tablet, Take 1-2 tablets (50-100 mg  total) by mouth at bedtime as needed for sleep., Disp: 120 tablet, Rfl: 1   VITAMIN D PO, Take 1 tablet by mouth daily., Disp: , Rfl:    Physical Exam:   BP 99/60   Pulse 60   Ht 5' 4 (1.626 m)   Wt 186 lb (84.4 kg)   LMP 10/03/2012   SpO2 97%   BMI 31.93 kg/m  Pertinent Findings  CN II-XII intact Bilateral EAC clear and TM intact with well pneumatized middle ear spaces Anterior rhinoscopy: Septum midline; bilateral inferior turbinates with no hypertrophy  No lesions of oral cavity/oropharynx; dentition wnl No obviously palpable neck masses/lymphadenopathy/thyromegaly No respiratory distress or stridor  Seprately Identifiable Procedures:  None   Impression & Plans:  Vanassa Penniman is a 68 y.o. female with the following   Tinnitus-  68 year old female seen in our office for evaluation of tinnitus.  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  The pulsatile tinnitus is occasional throughout the day worse at night, not persistent.  She has no associated neurologic symptoms.  Her audiogram does show slight asymmetry at 4000 Hz.    MRI? For tinnitus   - f/u ***   Thank you for allowing me the opportunity to care for your patient. Please do not hesitate to contact me should you have any other questions.  Sincerely, Chyrl Cohen PA-C [facility] Phone: [phone] Fax: [phone]  01/23/2024, 10:33 AM

## 2024-01-24 NOTE — Telephone Encounter (Signed)
 I called the patient and informed them of medication change via voice mail.

## 2024-01-27 LAB — ALLERGENS W/TOTAL IGE AREA 2
Alternaria Alternata IgE: <0.1 kU/L
Aspergillus Fumigatus IgE: <0.1 kU/L
Bermuda Grass IgE: <0.1 kU/L
Cat Dander IgE: <0.1 kU/L
Cedar, Mountain IgE: <0.1 kU/L
Cladosporium Herbarum IgE: <0.1 kU/L
Cockroach, German IgE: <0.1 kU/L
Common Silver Birch IgE: <0.1 kU/L
Cottonwood IgE: <0.1 kU/L
D Farinae IgE: <0.1 kU/L
D Pteronyssinus IgE: <0.1 kU/L
Dog Dander IgE: <0.1 kU/L
Elm, American IgE: <0.1 kU/L
IgE (Immunoglobulin E), Serum: 5 [IU]/mL — ABNORMAL LOW (ref 6–495)
Johnson Grass IgE: <0.1 kU/L
Maple/Box Elder IgE: <0.1 kU/L
Mouse Urine IgE: <0.1 kU/L
Oak, White IgE: <0.1 kU/L
Pecan, Hickory IgE: <0.1 kU/L
Penicillium Chrysogen IgE: <0.1 kU/L
Pigweed, Rough IgE: <0.1 kU/L
Ragweed, Short IgE: <0.1 kU/L
Sheep Sorrel IgE Qn: <0.1 kU/L
Timothy Grass IgE: <0.1 kU/L
White Mulberry IgE: <0.1 kU/L

## 2024-01-27 LAB — STREP PNEUMONIAE 23 SEROTYPES IGG
Pneumo Ab Type 1*: 0.7 ug/mL — ABNORMAL LOW (ref >1.3)
Pneumo Ab Type 12 (12F)*: <0.1 ug/mL — ABNORMAL LOW (ref >1.3)
Pneumo Ab Type 14*: <0.1 ug/mL — ABNORMAL LOW (ref >1.3)
Pneumo Ab Type 17 (17F)*: <0.1 ug/mL — ABNORMAL LOW (ref >1.3)
Pneumo Ab Type 19 (19F)*: 0.7 ug/mL — ABNORMAL LOW (ref >1.3)
Pneumo Ab Type 2*: 4.1 ug/mL (ref >1.3)
Pneumo Ab Type 20*: 6.8 ug/mL (ref >1.3)
Pneumo Ab Type 22 (22F)*: <0.1 ug/mL — ABNORMAL LOW (ref >1.3)
Pneumo Ab Type 23 (23F)*: 0.2 ug/mL — ABNORMAL LOW (ref >1.3)
Pneumo Ab Type 26 (6B)*: 0.3 ug/mL — ABNORMAL LOW (ref >1.3)
Pneumo Ab Type 3*: 0.2 ug/mL — ABNORMAL LOW (ref >1.3)
Pneumo Ab Type 34 (10A)*: <0.1 ug/mL — ABNORMAL LOW (ref >1.3)
Pneumo Ab Type 4*: <0.1 ug/mL — ABNORMAL LOW (ref >1.3)
Pneumo Ab Type 43 (11A)*: 1 ug/mL — ABNORMAL LOW (ref >1.3)
Pneumo Ab Type 5*: <0.1 ug/mL — ABNORMAL LOW (ref >1.3)
Pneumo Ab Type 51 (7F)*: 1 ug/mL — ABNORMAL LOW (ref >1.3)
Pneumo Ab Type 54 (15B)*: 0.2 ug/mL — ABNORMAL LOW (ref >1.3)
Pneumo Ab Type 56 (18C)*: 2.7 ug/mL (ref >1.3)
Pneumo Ab Type 57 (19A)*: 0.6 ug/mL — ABNORMAL LOW (ref >1.3)
Pneumo Ab Type 68 (9V)*: 0.4 ug/mL — ABNORMAL LOW (ref >1.3)
Pneumo Ab Type 70 (33F)*: 3.3 ug/mL (ref >1.3)
Pneumo Ab Type 8*: 31.5 ug/mL (ref >1.3)
Pneumo Ab Type 9 (9N)*: 0.5 ug/mL — ABNORMAL LOW (ref >1.3)

## 2024-01-27 LAB — IGG, IGA, IGM
IgA/Immunoglobulin A, Serum: 240 mg/dL (ref 87–352)
IgG (Immunoglobin G), Serum: 747 mg/dL (ref 586–1602)
IgM (Immunoglobulin M), Srm: 94 mg/dL (ref 26–217)

## 2024-01-27 LAB — DIPHTHERIA / TETANUS ANTIBODY PANEL
Diphtheria Ab: 0.43 [IU]/mL (ref <0.10)
Tetanus Ab, IgG: 4.75 [IU]/mL (ref <0.10)

## 2024-01-27 LAB — COMPLEMENT, TOTAL: Compl, Total (CH50): 54 U/mL (ref >41)

## 2024-01-30 ENCOUNTER — Ambulatory Visit: Payer: Self-pay | Admitting: Allergy

## 2024-02-06 ENCOUNTER — Other Ambulatory Visit: Payer: Self-pay | Admitting: Pharmacist

## 2024-02-06 ENCOUNTER — Encounter: Payer: Self-pay | Admitting: Pharmacist

## 2024-02-06 MED ORDER — ROSUVASTATIN CALCIUM 40 MG PO TABS: 40.0000 mg | ORAL_TABLET | Freq: Every day | ORAL | 0 refills | Status: DC

## 2024-02-06 NOTE — Progress Notes (Signed)
 Pharmacy Quality Measure Review  This patient is appearing on a report for being at risk of failing the adherence measure for cholesterol (statin) medications this calendar year.   Medication: rosuvastatin   Last fill date: 09/08/2023 for 90 day supply per adherence report  Reviewed recent refill history in Dr Annemarie database. Actual last refill date was 12/13/2023 for 90 day supply. Patient has no refills remaining. Next appointment with PCP is 04/05/2024.   She will need 1 more refill before next appt with PCP - Send in update Rx today for rosuvastatin    Insurance report was not up to date. Will collaborate with provider to facilitate refill needs to get her thru until next follow up appointment  Madelin Ray, PharmD Clinical Pharmacist [facility]

## 2024-03-06 ENCOUNTER — Other Ambulatory Visit: Payer: Self-pay | Admitting: Family Medicine

## 2024-03-14 ENCOUNTER — Other Ambulatory Visit (HOSPITAL_BASED_OUTPATIENT_CLINIC_OR_DEPARTMENT_OTHER): Payer: Self-pay | Admitting: Family Medicine

## 2024-03-14 DIAGNOSIS — Z1231 Encounter for screening mammogram for malignant neoplasm of breast: Secondary | ICD-10-CM

## 2024-03-20 ENCOUNTER — Other Ambulatory Visit: Payer: Self-pay | Admitting: Family Medicine

## 2024-04-01 DIAGNOSIS — M8580 Other specified disorders of bone density and structure, unspecified site: Secondary | ICD-10-CM | POA: Insufficient documentation

## 2024-04-01 NOTE — Assessment & Plan Note (Signed)
 Patient encouraged to maintain heart healthy diet, regular exercise, adequate sleep. Consider daily probiotics. Take medications as prescribed Labs ordered and reviewed Given and reviewed copy of ACP documents from [facility] Secretary of State and encouraged to complete and return  Colonoscopy 2010, agrees to Cologuard today, ordered Mgm 04/2023 repeat annually, ordered Dexa 04/2023 repeat every 3-5 years.  Pap 2018 [facility], has declined further evaluations due to pain, no Sexual activity in over 20 years.

## 2024-04-01 NOTE — Assessment & Plan Note (Signed)
 Encourage heart healthy diet such as MIND or DASH diet, increase exercise, avoid trans fats, simple carbohydrates and processed foods, consider a krill or fish or flaxseed oil cap daily.

## 2024-04-01 NOTE — Assessment & Plan Note (Signed)
 [id number] acceptable, minimize simple carbs. Increase exercise as tolerated.

## 2024-04-01 NOTE — Assessment & Plan Note (Signed)
 Hydrate and monitor

## 2024-04-01 NOTE — Assessment & Plan Note (Signed)
 Encouraged to get adequate exercise, calcium and vitamin d intake

## 2024-04-05 ENCOUNTER — Ambulatory Visit: Payer: Self-pay | Admitting: Family Medicine

## 2024-04-05 ENCOUNTER — Ambulatory Visit: Payer: Medicare Other | Admitting: Family Medicine

## 2024-04-05 ENCOUNTER — Encounter: Payer: Self-pay | Admitting: Family Medicine

## 2024-04-05 ENCOUNTER — Ambulatory Visit

## 2024-04-05 VITALS — BP 118/72 | HR 68 | Temp 98.2°F | Resp 16 | Ht 64.0 in | Wt 192.4 lb

## 2024-04-05 DIAGNOSIS — M8580 Other specified disorders of bone density and structure, unspecified site: Secondary | ICD-10-CM

## 2024-04-05 DIAGNOSIS — Z23 Encounter for immunization: Secondary | ICD-10-CM | POA: Diagnosis not present

## 2024-04-05 DIAGNOSIS — R739 Hyperglycemia, unspecified: Secondary | ICD-10-CM | POA: Diagnosis not present

## 2024-04-05 DIAGNOSIS — M791 Myalgia, unspecified site: Secondary | ICD-10-CM | POA: Diagnosis not present

## 2024-04-05 DIAGNOSIS — M79671 Pain in right foot: Secondary | ICD-10-CM

## 2024-04-05 DIAGNOSIS — R350 Frequency of micturition: Secondary | ICD-10-CM | POA: Diagnosis not present

## 2024-04-05 DIAGNOSIS — R059 Cough, unspecified: Secondary | ICD-10-CM

## 2024-04-05 DIAGNOSIS — M79672 Pain in left foot: Secondary | ICD-10-CM

## 2024-04-05 DIAGNOSIS — E782 Mixed hyperlipidemia: Secondary | ICD-10-CM | POA: Diagnosis not present

## 2024-04-05 DIAGNOSIS — Z0000 Encounter for general adult medical examination without abnormal findings: Secondary | ICD-10-CM

## 2024-04-05 DIAGNOSIS — Z0001 Encounter for general adult medical examination with abnormal findings: Secondary | ICD-10-CM

## 2024-04-05 DIAGNOSIS — K219 Gastro-esophageal reflux disease without esophagitis: Secondary | ICD-10-CM

## 2024-04-05 DIAGNOSIS — L84 Corns and callosities: Secondary | ICD-10-CM

## 2024-04-05 DIAGNOSIS — R252 Cramp and spasm: Secondary | ICD-10-CM

## 2024-04-05 DIAGNOSIS — R600 Localized edema: Secondary | ICD-10-CM

## 2024-04-05 LAB — CBC WITH DIFFERENTIAL/PLATELET
Basophils Absolute: 0 K/uL (ref 0.0–0.1)
Basophils Relative: 0.8 % (ref 0.0–3.0)
Eosinophils Absolute: 0.1 K/uL (ref 0.0–0.7)
Eosinophils Relative: 1.5 % (ref 0.0–5.0)
HCT: 41.9 % (ref 36.0–46.0)
Hemoglobin: 13.8 g/dL (ref 12.0–15.0)
Lymphocytes Relative: 40.2 % (ref 12.0–46.0)
Lymphs Abs: 2.4 K/uL (ref 0.7–4.0)
MCHC: 33 g/dL (ref 30.0–36.0)
MCV: 86.8 fl (ref 78.0–100.0)
Monocytes Absolute: 0.4 K/uL (ref 0.1–1.0)
Monocytes Relative: 6 % (ref 3.0–12.0)
Neutro Abs: 3.1 K/uL (ref 1.4–7.7)
Neutrophils Relative %: 51.5 % (ref 43.0–77.0)
Platelets: 216 K/uL (ref 150.0–400.0)
RBC: 4.83 Mil/uL (ref 3.87–5.11)
RDW: 13.1 % (ref 11.5–15.5)
WBC: 6 K/uL (ref 4.0–10.5)

## 2024-04-05 LAB — LIPID PANEL
Cholesterol: 140 mg/dL (ref 0–200)
HDL: 48.4 mg/dL (ref >39.00)
LDL Cholesterol: 69 mg/dL (ref 0–99)
NonHDL: 91.52
Total CHOL/HDL Ratio: 3
Triglycerides: 114 mg/dL (ref 0.0–149.0)
VLDL: 22.8 mg/dL (ref 0.0–40.0)

## 2024-04-05 LAB — COMPREHENSIVE METABOLIC PANEL WITH GFR
ALT: 13 U/L (ref 0–35)
AST: 14 U/L (ref 0–37)
Albumin: 4.4 g/dL (ref 3.5–5.2)
Alkaline Phosphatase: 62 U/L (ref 39–117)
BUN: 15 mg/dL (ref 6–23)
CO2: 28 meq/L (ref 19–32)
Calcium: 9.4 mg/dL (ref 8.4–10.5)
Chloride: 104 meq/L (ref 96–112)
Creatinine, Ser: 0.79 mg/dL (ref 0.40–1.20)
GFR: 78.04 mL/min (ref >60.00)
Glucose, Bld: 80 mg/dL (ref 70–99)
Potassium: 4.3 meq/L (ref 3.5–5.1)
Sodium: 140 meq/L (ref 135–145)
Total Bilirubin: 0.7 mg/dL (ref 0.2–1.2)
Total Protein: 6.4 g/dL (ref 6.0–8.3)

## 2024-04-05 LAB — TSH: TSH: 1.31 u[IU]/mL (ref 0.35–5.50)

## 2024-04-05 LAB — VITAMIN D 25 HYDROXY (VIT D DEFICIENCY, FRACTURES): VITD: 37.99 ng/mL (ref 30.00–100.00)

## 2024-04-05 LAB — HEMOGLOBIN A1C: Hgb A1c MFr Bld: 5.8 % (ref 4.6–6.5)

## 2024-04-05 MED ORDER — OMEPRAZOLE 40 MG PO CPDR: DELAYED_RELEASE_CAPSULE | Freq: Every day | ORAL | 3 refills | Status: AC

## 2024-04-05 MED ORDER — ROPINIROLE HCL 1 MG PO TABS: ORAL_TABLET | ORAL | 3 refills | Status: AC

## 2024-04-05 MED ORDER — BUPROPION HCL ER (XL) 300 MG PO TB24: 300.0000 mg | ORAL_TABLET | Freq: Every day | ORAL | 1 refills | Status: AC

## 2024-04-05 MED ORDER — OMEPRAZOLE 40 MG PO CPDR: DELAYED_RELEASE_CAPSULE | Freq: Every day | ORAL | 1 refills | Status: DC

## 2024-04-05 NOTE — Progress Notes (Signed)
 Subjective:    Patient ID: [name], female    DOB: [birth year], 68 y.o.   MRN: [id number]  Chief Complaint  Patient presents with   Annual Exam    Patient presents today for a physical exam.   Quality Metric Gaps    Mammogram, zoster, pneumococcal vaccines    HPI Discussed the use of AI scribe software for clinical note transcription with the patient, who gave verbal consent to proceed.  History of Present Illness [name] is a 68 year old female who presents with urinary odor and yeast infection symptoms.  She has experienced an odor in her urine for the past two to three weeks, accompanied by urinary frequency, particularly when delaying urination. She uses Dial soap and is contemplating switching to a milder soap.  She reports redness and occasional itching in the inguinal area. She uses Monistat for itch relief, though it washes away quickly. She reports similar symptoms on her eyelid, including itching, and notes she had this issue years ago.  She has a history of lymphedema affecting her foot, ankle, and toes, causing numbness and a sensation of fluid. She experiences a persistent cough ongoing for two years, sometimes productive with light yellow phlegm, associated with reflux symptoms. These include abdominal sounds after eating, particularly on the left side. She takes omeprazole  40 mg for reflux but requires a refill as she has been using over-the-counter alternatives.  She experiences bilateral foot pain and calluses and seeks a referral to a podiatrist. She also mentions a need for a refill of ropinirole  for restless legs and requests an increase in her Wellbutrin  dosage from 150 mg to 300 mg due to ongoing depression symptoms.  She has a history of allergies, but recent testing showed negative results for common allergens. She drinks water  constantly and is considering returning to work part-time for social interaction. She lives alone and finds it different  living by herself.    Past Medical History:  Diagnosis Date   Acute sinusitis 03/27/2013   Allergy     seasonal   Anxiety    Arthritis 06/09/2013   Depression    Dyspareunia    Edema extremities    Encounter for preventative adult health care exam with abnormal findings 04/21/2014   GERD (gastroesophageal reflux disease)    Hip pain, left 11/01/2011   History of proteinuria syndrome    Hyperlipidemia    Insomnia 03/11/2013   Low back pain 05/09/2011   Lymphadenitis 06/16/2012   Myalgia and myositis 02/15/2015   Obese    Onychomycosis 05/09/2011   Oral lesion 08/26/2013   Otitis externa 06/23/2012   left   Perimenopause 03/11/2013   RLS (restless legs syndrome)    RUQ pain 09/20/2011   Sleep apnea 04/21/2014   Tinea corporis 04/26/2017   Unspecified constipation 05/13/2013   Urinary frequency 08/26/2013    Past Surgical History:  Procedure Laterality Date   BACK SURGERY  2006   low, discectomy for herniated disc   BLADDER SUSPENSION N/A 11/07/2012   Procedure: [facility] SLING;  Surgeon: Norleen JINNY Seltzer, MD;  Location: [facility];  Service: Urology;  Laterality: N/A;   CARPAL TUNNEL RELEASE Right 2006   CESAREAN SECTION  1993   CYSTOSCOPY N/A 11/07/2012   Procedure: CYSTOSCOPY;  Surgeon: Norleen JINNY Seltzer, MD;  Location: [facility];  Service: Urology;  Laterality: N/A;   TUBAL LIGATION  1993    Family History  Problem Relation Age of Onset  Cancer Mother 81       thyroid , malignant brain tumor   Arthritis Mother        neck, back   Arthritis Father        back pain   Heart disease Father        stent   Hepatitis Sister        Hepatitis C   Heart disease Sister        stent   Breast cancer Maternal Aunt    Colon polyps Maternal Grandmother    Diabetes Maternal Grandmother    Colon cancer Neg Hx    Rectal cancer Neg Hx    Stomach cancer Neg Hx     Social History   Socioeconomic History   Marital status: Divorced    Spouse name:  Not on file   Number of children: Not on file   Years of education: Not on file   Highest education level: Not on file  Occupational History   Not on file  Tobacco Use   Smoking status: Former    Current packs/day: 0.00    Average packs/day: 0.5 packs/day for 20.0 years (10.0 ttl pk-yrs)    Types: Cigarettes    Start date: 07/12/1972    Quit date: 07/12/1992    Years since quitting: 31.7   Smokeless tobacco: Never  Vaping Use   Vaping status: Never Used  Substance and Sexual Activity   Alcohol use: Not Currently   Drug use: No   Sexual activity: Yes    Partners: Male  Other Topics Concern   Not on file  Social History Narrative   Not on file   Social Drivers of Health   Financial Resource Strain: Medium Risk (10/14/2023)   Overall Financial Resource Strain (CARDIA)    Difficulty of Paying Living Expenses: Somewhat hard  Food Insecurity: No Food Insecurity (10/14/2023)   Hunger Vital Sign    Worried About Running Out of Food in the Last Year: Never true    Ran Out of Food in the Last Year: Never true  Transportation Needs: No Transportation Needs (10/14/2023)   PRAPARE - Administrator, Civil Service (Medical): No    Lack of Transportation (Non-Medical): No  Physical Activity: Sufficiently Active (10/14/2023)   Exercise Vital Sign    Days of Exercise per Week: 7 days    Minutes of Exercise per Session: 30 min  Stress: No Stress Concern Present (10/14/2023)   [employer] of Occupational Health - Occupational Stress Questionnaire    Feeling of Stress : Only a little  Social Connections: Moderately Isolated (10/14/2023)   Social Connection and Isolation Panel    Frequency of Communication with Friends and Family: Once a week    Frequency of Social Gatherings with Friends and Family: Once a week    Attends Religious Services: More than 4 times per year    Active Member of [employer] or Organizations: Yes    Attends Engineer, structural: More than 4 times per year     Marital Status: Divorced  Intimate Partner Violence: Not At Risk (10/14/2023)   Humiliation, Afraid, Rape, and Kick questionnaire    Fear of Current or Ex-Partner: No    Emotionally Abused: No    Physically Abused: No    Sexually Abused: No    Outpatient Medications Prior to Visit  Medication Sig Dispense Refill   acetaminophen  (TYLENOL ) 500 MG tablet Take 1,000 mg by mouth every 6 (six) hours as needed for headache  or mild pain.     albuterol  (VENTOLIN  HFA) 108 (90 Base) MCG/ACT inhaler Inhale 2 puffs into the lungs every 4 (four) hours as needed for wheezing or shortness of breath (coughing fits). 18 g 1   Ascorbic Acid (VITAMIN C PO) Take 1 tablet by mouth daily.     Bacillus Coagulans-Inulin (PROBIOTIC) 1-250 BILLION-MG CAPS Take by mouth.     baclofen  (LIORESAL ) 20 MG tablet Take 1 tablet (20 mg total) by mouth at bedtime as needed for muscle spasms. 90 tablet 1   buPROPion  (WELLBUTRIN  XL) 150 MG 24 hr tablet Take 1 tablet (150 mg total) by mouth every morning. 90 tablet 1   busPIRone  (BUSPAR ) 7.5 MG tablet TAKE 1 TABLET(7.5 MG) BY MOUTH TWICE DAILY. 180 tablet 1   famotidine  (PEPCID ) 40 MG tablet Take 1 tablet (40 mg total) by mouth at bedtime. 90 tablet 3   fluticasone  (FLONASE ) 50 MCG/ACT nasal spray Place 2 sprays into both nostrils daily as needed for allergies. 16 g 1   hydrOXYzine  (VISTARIL ) 25 MG capsule Take 1 capsule (25 mg total) by mouth daily. 90 capsule 1   potassium chloride  SA (KLOR-CON  M) 20 MEQ tablet TAKE 1 TABLET BY MOUTH DAILY. TAKE 2ND TABLET DAILY AS NEEDED 180 tablet 0   promethazine  (PHENERGAN ) 12.5 MG tablet TAKE 1 TABLET(12.5 MG) BY MOUTH EVERY 8 HOURS AS NEEDED FOR NAUSEA OR VOMITING 30 tablet 5   rOPINIRole  (REQUIP ) 1 MG tablet Take 1 tablet by mouth at bedtime 90 tablet 1   rosuvastatin  (CRESTOR ) 40 MG tablet TAKE 1 TABLET(40 MG) BY MOUTH DAILY 90 tablet 0   torsemide  (DEMADEX ) 20 MG tablet Take 1 tablet (20 mg total) by mouth daily as needed. 90 tablet 1    traZODone  (DESYREL ) 50 MG tablet Take 1-2 tablets (50-100 mg total) by mouth at bedtime as needed for sleep. 120 tablet 1   VITAMIN D  PO Take 1 tablet by mouth daily.     omeprazole  (PRILOSEC) 40 MG capsule TAKE 1 CAPSULE (40 MG TOTAL) BY MOUTH DAILY. 90 capsule 1   LORazepam  (ATIVAN ) 0.5 MG tablet TAKE 1 TABLET BY MOUTH DAILY AS NEEDED FOR SEVERE ANXIETY (Patient not taking: Reported on 04/05/2024) 30 tablet 1   No facility-administered medications prior to visit.    Allergies  Allergen Reactions   Elemental Sulfur Diarrhea and Nausea And Vomiting   Carbamazepine Hives   Ceclor [Cefaclor] Other (See Comments)    Tongue swell   Morphine  And Codeine  Itching   Atorvastatin  Other (See Comments)    Leg pain    Review of Systems  Constitutional:  Positive for malaise/fatigue. Negative for fever.  HENT:  Positive for congestion.   Eyes:  Negative for blurred vision.  Respiratory:  Positive for sputum production. Negative for shortness of breath.   Cardiovascular:  Positive for leg swelling. Negative for chest pain and palpitations.  Gastrointestinal:  Negative for abdominal pain, blood in stool and nausea.  Genitourinary:  Positive for frequency. Negative for dysuria, flank pain and hematuria.  Musculoskeletal:  Positive for myalgias. Negative for falls.  Skin:  Positive for itching and rash.  Neurological:  Negative for dizziness, loss of consciousness and headaches.  Endo/Heme/Allergies:  Negative for environmental allergies.  Psychiatric/Behavioral:  Positive for depression. Negative for suicidal ideas. The patient is nervous/anxious.        Objective:    Physical Exam Constitutional:      General: She is not in acute distress.    Appearance: Normal appearance.  She is well-developed. She is not toxic-appearing.  HENT:     Head: Normocephalic and atraumatic.     Right Ear: External ear normal.     Left Ear: External ear normal.     Nose: Nose normal.  Eyes:     General:         Right eye: No discharge.        Left eye: No discharge.     Conjunctiva/sclera: Conjunctivae normal.  Neck:     Thyroid : No thyromegaly.  Cardiovascular:     Rate and Rhythm: Normal rate and regular rhythm.     Heart sounds: Normal heart sounds. No murmur heard. Pulmonary:     Effort: Pulmonary effort is normal. No respiratory distress.     Breath sounds: Normal breath sounds.  Abdominal:     General: Bowel sounds are normal.     Palpations: Abdomen is soft.     Tenderness: There is no abdominal tenderness. There is no guarding.  Musculoskeletal:        General: Normal range of motion.     Cervical back: Neck supple.  Lymphadenopathy:     Cervical: No cervical adenopathy.  Skin:    General: Skin is warm and dry.  Neurological:     Mental Status: She is alert and oriented to person, place, and time.  Psychiatric:        Mood and Affect: Mood normal.        Behavior: Behavior normal.        Thought Content: Thought content normal.        Judgment: Judgment normal.     BP 118/72   Pulse 68   Temp 98.2 F (36.8 C)   Resp 16   Ht 5' 4 (1.626 m)   Wt 192 lb 6.4 oz (87.3 kg)   LMP 10/03/2012   SpO2 99%   BMI 33.03 kg/m  Wt Readings from Last 3 Encounters:  04/05/24 192 lb 6.4 oz (87.3 kg)  01/23/24 186 lb (84.4 kg)  12/29/23 186 lb (84.4 kg)    Diabetic Foot Exam - Simple   No data filed    Lab Results  Component Value Date   WBC 8.1 11/28/2023   HGB 14.7 11/28/2023   HCT 44.4 11/28/2023   PLT 228.0 11/28/2023   GLUCOSE 80 11/28/2023   CHOL 157 11/28/2023   TRIG 158.0 (H) 11/28/2023   HDL 49.20 11/28/2023   LDLDIRECT 80.0 01/11/2023   LDLCALC 76 11/28/2023   ALT 18 11/28/2023   AST 17 11/28/2023   NA 140 11/28/2023   K 3.9 11/28/2023   CL 101 11/28/2023   CREATININE 0.82 11/28/2023   BUN 14 11/28/2023   CO2 29 11/28/2023   TSH 1.22 11/28/2023   HGBA1C 5.5 11/28/2023    Lab Results  Component Value Date   TSH 1.22 11/28/2023   Lab  Results  Component Value Date   WBC 8.1 11/28/2023   HGB 14.7 11/28/2023   HCT 44.4 11/28/2023   MCV 86.4 11/28/2023   PLT 228.0 11/28/2023   Lab Results  Component Value Date   NA 140 11/28/2023   K 3.9 11/28/2023   CO2 29 11/28/2023   GLUCOSE 80 11/28/2023   BUN 14 11/28/2023   CREATININE 0.82 11/28/2023   BILITOT 0.8 11/28/2023   ALKPHOS 63 11/28/2023   AST 17 11/28/2023   ALT 18 11/28/2023   PROT 7.0 11/28/2023   ALBUMIN 4.7 11/28/2023   CALCIUM  9.7 11/28/2023   ANIONGAP  7 05/23/2021   GFR 74.81 11/28/2023   Lab Results  Component Value Date   CHOL 157 11/28/2023   Lab Results  Component Value Date   HDL 49.20 11/28/2023   Lab Results  Component Value Date   LDLCALC 76 11/28/2023   Lab Results  Component Value Date   TRIG 158.0 (H) 11/28/2023   Lab Results  Component Value Date   CHOLHDL 3 11/28/2023   Lab Results  Component Value Date   HGBA1C 5.5 11/28/2023       Assessment & Plan:  Hyperglycemia Assessment & Plan: hgba1c acceptable, minimize simple carbs. Increase exercise as tolerated.    Orders: -     Comprehensive metabolic panel with GFR -     CBC with Differential/Platelet -     Hemoglobin A1c  Mixed hyperlipidemia Assessment & Plan: Encourage heart healthy diet such as MIND or DASH diet, increase exercise, avoid trans fats, simple carbohydrates and processed foods, consider a krill or fish or flaxseed oil cap daily.    Orders: -     Lipid panel -     TSH  Muscle cramp Assessment & Plan: Hydrate and monitor    Myalgia Assessment & Plan: Hydrate and monitor    Preventative health care Assessment & Plan: Patient encouraged to maintain heart healthy diet, regular exercise, adequate sleep. Consider daily probiotics. Take medications as prescribed Labs ordered and reviewed Given and reviewed copy of ACP documents from [facility] Secretary of State and encouraged to complete and return  Colonoscopy 2010, agrees to Cologuard today,  ordered Mgm 04/2023 repeat annually, ordered Dexa 04/2023 repeat every 3-5 years.  Pap 2018 [facility], has declined further evaluations due to pain, no Sexual activity in over 20 years.  Orders: -     Comprehensive metabolic panel with GFR -     CBC with Differential/Platelet -     Lipid panel -     Hemoglobin A1c -     TSH  Osteopenia, unspecified location Assessment & Plan: Encouraged to get adequate exercise, calcium  and vitamin d  intake   Orders: -     VITAMIN D  25 Hydroxy (Vit-D Deficiency, Fractures)  Urinary frequency -     Urinalysis, Complete -     Urine Culture  Bilateral foot pain -     Ambulatory referral to Podiatry  Foot callus -     Ambulatory referral to Podiatry  Pedal edema -     Compression stockings  Other orders -     Omeprazole ; TAKE 1 CAPSULE (40 MG TOTAL) BY MOUTH DAILY.  Dispense: 90 capsule; Refill: 1    Assessment and Plan Assessment & Plan Adult Wellness Visit Routine wellness visit with emphasis on overall well-being through exercise, hydration, and a healthy diet. - Administer regular flu shot - Encourage participation in chair yoga - Discuss benefits of regular exercise and hydration  Chronic cough with postnasal drip and gastroesophageal reflux disease Chronic cough for two years with occasional productive cough and GERD symptoms. Previous chest x-ray in May 2025 showed no acute findings. Need to rule out silent reflux or vocal cord polyps. - Refer to ENT for vocal cord evaluation - Refer to gastroenterology for further evaluation - Prescribe omeprazole  40 mg for GERD, send to [facility]  Depression Persistent symptoms despite current bupropion  150 mg treatment. Discussed increasing to 300 mg extended release and potential counseling services. - Increase bupropion  to 300 mg extended release, send to [facility] - Discuss potential referral to counseling services if needed  Restless legs syndrome Symptoms persist with current  ropinirole  treatment, though some relief is noted. No significant side effects reported. - Refill ropinirole  prescription, send to [facility]  Bilateral foot pain and calluses Hard calluses causing pain. - Refer to [facility]iatrist at [facility] for evaluation and management of foot pain and calluses  Lymphedema of bilateral lower extremities Lymphedema in feet and ankles with fluid sensation and numbness. Discussed management with compression stockings and elevation. - Prescribe light compression stockings (10-20 mmHg) - Advise elevating feet above heart for 15 minutes a couple of times a day - Advise against going barefoot  Vulvovaginal candidiasis and intertrigo Redness and itching in the inguinal area suggestive of yeast infection. Discussed management with pH-balanced cleansers and drying techniques. - Recommend Good Clean Love cleanser for pH balance - Advise using a blow dryer to dry the area after showering - Advise against using perfumed soaps  Urinary symptoms with malodorous urine, under evaluation Malodorous urine for 2-3 weeks with increased frequency but no urgency or blood. - Order urinalysis to evaluate urinary symptoms  Recording duration: 39 minutes     Harlene Horton, MD

## 2024-04-05 NOTE — Patient Instructions (Addendum)
 GoodCleanLove cleanser can be purchased online  Hyaluronic Acid gel for eye area  Prevnar 20 or Pneumovax 23 RSV, Respiratory Syncitial Virus Vaccine, Arexvy at pharmacy Annual covid and flu vaccine Shingrix  is the new shingles shot, 2 shots over 2-6 months, confirm coverage with insurance and document, then can return here for shots with nurse appt or at pharmacy    Preventive Care 65 Years and Older, Female Preventive care refers to lifestyle choices and visits with your health care provider that can promote health and wellness. Preventive care visits are also called wellness exams. What can I expect for my preventive care visit? Counseling Your health care provider may ask you questions about your: Medical history, including: Past medical problems. Family medical history. Pregnancy and menstrual history. History of falls. Current health, including: Memory and ability to understand (cognition). Emotional well-being. Home life and relationship well-being. Sexual activity and sexual health. Lifestyle, including: Alcohol, nicotine or tobacco, and drug use. Access to firearms. Diet, exercise, and sleep habits. Work and work Astronomer. Sunscreen use. Safety issues such as seatbelt and bike helmet use. Physical exam Your health care provider will check your: Height and weight. These may be used to calculate your BMI (body mass index). BMI is a measurement that tells if you are at a healthy weight. Waist circumference. This measures the distance around your waistline. This measurement also tells if you are at a healthy weight and may help predict your risk of certain diseases, such as type 2 diabetes and high blood pressure. Heart rate and blood pressure. Body temperature. Skin for abnormal spots. What immunizations do I need?  Vaccines are usually given at various ages, according to a schedule. Your health care provider will recommend vaccines for you based on your age, medical  history, and lifestyle or other factors, such as travel or where you work. What tests do I need? Screening Your health care provider may recommend screening tests for certain conditions. This may include: Lipid and cholesterol levels. Hepatitis C test. Hepatitis B test. HIV (human immunodeficiency virus) test. STI (sexually transmitted infection) testing, if you are at risk. Lung cancer screening. Colorectal cancer screening. Diabetes screening. This is done by checking your blood sugar (glucose) after you have not eaten for a while (fasting). Mammogram. Talk with your health care provider about how often you should have regular mammograms. BRCA-related cancer screening. This may be done if you have a family history of breast, ovarian, tubal, or peritoneal cancers. Bone density scan. This is done to screen for osteoporosis. Talk with your health care provider about your test results, treatment options, and if necessary, the need for more tests. Follow these instructions at home: Eating and drinking  Eat a diet that includes fresh fruits and vegetables, whole grains, lean protein, and low-fat dairy products. Limit your intake of foods with high amounts of sugar, saturated fats, and salt. Take vitamin and mineral supplements as recommended by your health care provider. Do not drink alcohol if your health care provider tells you not to drink. If you drink alcohol: Limit how much you have to 0-1 drink a day. Know how much alcohol is in your drink. In the [place] one drink equals one 12 oz bottle of beer (355 mL), one 5 oz glass of wine (148 mL), or one 1 oz glass of hard liquor (44 mL). Lifestyle Brush your teeth every morning and night with fluoride toothpaste. Floss one time each day. Exercise for at least 30 minutes 5 or more days each  week. Do not use any products that contain nicotine or tobacco. These products include cigarettes, chewing tobacco, and vaping devices, such as e-cigarettes.  If you need help quitting, ask your health care provider. Do not use drugs. If you are sexually active, practice safe sex. Use a condom or other form of protection in order to prevent STIs. Take aspirin only as told by your health care provider. Make sure that you understand how much to take and what form to take. Work with your health care provider to find out whether it is safe and beneficial for you to take aspirin daily. Ask your health care provider if you need to take a cholesterol-lowering medicine (statin). Find healthy ways to manage stress, such as: Meditation, yoga, or listening to music. Journaling. Talking to a trusted person. Spending time with friends and family. Minimize exposure to UV radiation to reduce your risk of skin cancer. Safety Always wear your seat belt while driving or riding in a vehicle. Do not drive: If you have been drinking alcohol. Do not ride with someone who has been drinking. When you are tired or distracted. While texting. If you have been using any mind-altering substances or drugs. Wear a helmet and other protective equipment during sports activities. If you have firearms in your house, make sure you follow all gun safety procedures. What's next? Visit your health care provider once a year for an annual wellness visit. Ask your health care provider how often you should have your eyes and teeth checked. Stay up to date on all vaccines. This information is not intended to replace advice given to you by your health care provider. Make sure you discuss any questions you have with your health care provider. Document Revised: 12/24/2020 Document Reviewed: 12/24/2020 Elsevier Patient Education  2024 [employer].

## 2024-04-06 LAB — URINALYSIS, COMPLETE
Bacteria, UA: NONE SEEN /HPF
Bilirubin Urine: NEGATIVE
Glucose, UA: NEGATIVE
Hgb urine dipstick: NEGATIVE
Hyaline Cast: NONE SEEN /LPF
Ketones, ur: NEGATIVE
Nitrite: NEGATIVE
Protein, ur: NEGATIVE
RBC / HPF: NONE SEEN /HPF (ref 0–2)
Specific Gravity, Urine: 1.015 (ref 1.001–1.035)
Squamous Epithelial / HPF: NONE SEEN /HPF (ref <=5)
WBC, UA: NONE SEEN /HPF (ref 0–5)
pH: 6 (ref 5.0–8.0)

## 2024-04-06 LAB — URINE CULTURE
MICRO NUMBER:: 17016785
Result:: NO GROWTH
SPECIMEN QUALITY:: ADEQUATE

## 2024-04-10 ENCOUNTER — Ambulatory Visit (INDEPENDENT_AMBULATORY_CARE_PROVIDER_SITE_OTHER)

## 2024-04-10 VITALS — Ht 64.0 in | Wt 192.0 lb

## 2024-04-10 DIAGNOSIS — Z0000 Encounter for general adult medical examination without abnormal findings: Secondary | ICD-10-CM | POA: Diagnosis not present

## 2024-04-10 NOTE — Progress Notes (Signed)
 Subjective:   [name] is a 68 y.o. who presents for a Medicare Wellness preventive visit.  As a reminder, Annual Wellness Visits don't include a physical exam, and some assessments may be limited, especially if this visit is performed virtually. We may recommend an in-person follow-up visit with your provider if needed.  Visit Complete: Virtual I connected with  [name] on 04/10/24 by a audio enabled telemedicine application and verified that I am speaking with the correct person using two identifiers.  Patient Location: Home  Provider Location: Home Office  I discussed the limitations of evaluation and management by telemedicine. The patient expressed understanding and agreed to proceed.  Vital Signs: Because this visit was a virtual/telehealth visit, some criteria may be missing or patient reported. Any vitals not documented were not able to be obtained and vitals that have been documented are patient reported.    Persons Participating in Visit: Patient.  AWV Questionnaire: No: Patient Medicare AWV questionnaire was not completed prior to this visit.  Cardiac Risk Factors include: advanced age (>62men, >43 women)     Objective:    Today's Vitals   04/10/24 0905  Weight: 192 lb (87.1 kg)  Height: 5' 4 (1.626 m)   Body mass index is 32.96 kg/m.     04/10/2024    9:11 AM 10/14/2023    2:18 PM 09/20/2022    9:44 AM 01/22/2015    2:25 PM 02/06/2014   11:18 AM 11/07/2012    6:56 AM  Advanced Directives  Does Patient Have a Medical Advance Directive? No No No No  Patient would like information  Patient does not have advance directive   Would patient like information on creating a medical advance directive? No - Patient declined No - Patient declined No - Patient declined No - patient declined information  Advance directive brochure given (Outpatient ONLY)    Pre-existing out of facility DNR order (yellow form or pink MOST form)      No      Data saved with a  previous flowsheet row definition    Current Medications (verified) Outpatient Encounter Medications as of 04/10/2024  Medication Sig   acetaminophen  (TYLENOL ) 500 MG tablet Take 1,000 mg by mouth every 6 (six) hours as needed for headache or mild pain.   albuterol  (VENTOLIN  HFA) 108 (90 Base) MCG/ACT inhaler Inhale 2 puffs into the lungs every 4 (four) hours as needed for wheezing or shortness of breath (coughing fits).   Ascorbic Acid (VITAMIN C PO) Take 1 tablet by mouth daily.   Bacillus Coagulans-Inulin (PROBIOTIC) 1-250 BILLION-MG CAPS Take by mouth.   baclofen  (LIORESAL ) 20 MG tablet Take 1 tablet (20 mg total) by mouth at bedtime as needed for muscle spasms.   buPROPion  (WELLBUTRIN  XL) 300 MG 24 hr tablet Take 1 tablet (300 mg total) by mouth daily.   busPIRone  (BUSPAR ) 7.5 MG tablet TAKE 1 TABLET(7.5 MG) BY MOUTH TWICE DAILY.   famotidine  (PEPCID ) 40 MG tablet Take 1 tablet (40 mg total) by mouth at bedtime.   fluticasone  (FLONASE ) 50 MCG/ACT nasal spray Place 2 sprays into both nostrils daily as needed for allergies.   hydrOXYzine  (VISTARIL ) 25 MG capsule Take 1 capsule (25 mg total) by mouth daily.   LORazepam  (ATIVAN ) 0.5 MG tablet TAKE 1 TABLET BY MOUTH DAILY AS NEEDED FOR SEVERE ANXIETY (Patient not taking: Reported on 04/05/2024)   omeprazole  (PRILOSEC) 40 MG capsule TAKE 1 CAPSULE (40 MG TOTAL) BY MOUTH DAILY.   potassium  chloride SA (KLOR-CON  M) 20 MEQ tablet TAKE 1 TABLET BY MOUTH DAILY. TAKE 2ND TABLET DAILY AS NEEDED   promethazine  (PHENERGAN ) 12.5 MG tablet TAKE 1 TABLET(12.5 MG) BY MOUTH EVERY 8 HOURS AS NEEDED FOR NAUSEA OR VOMITING   rOPINIRole  (REQUIP ) 1 MG tablet Take 1 tablet by mouth at bedtime   rosuvastatin  (CRESTOR ) 40 MG tablet TAKE 1 TABLET(40 MG) BY MOUTH DAILY   torsemide  (DEMADEX ) 20 MG tablet Take 1 tablet (20 mg total) by mouth daily as needed.   traZODone  (DESYREL ) 50 MG tablet Take 1-2 tablets (50-100 mg total) by mouth at bedtime as needed for sleep.    VITAMIN D  PO Take 1 tablet by mouth daily.   No facility-administered encounter medications on file as of 04/10/2024.    Allergies (verified) Elemental sulfur, Carbamazepine, Ceclor [cefaclor], Morphine  and codeine , and Atorvastatin    History: Past Medical History:  Diagnosis Date   Acute sinusitis 03/27/2013   Allergy     seasonal   Anxiety    Arthritis 06/09/2013   Depression    Dyspareunia    Edema extremities    Encounter for preventative adult health care exam with abnormal findings 04/21/2014   GERD (gastroesophageal reflux disease)    Hip pain, left 11/01/2011   History of proteinuria syndrome    Hyperlipidemia    Insomnia 03/11/2013   Low back pain 05/09/2011   Lymphadenitis 06/16/2012   Myalgia and myositis 02/15/2015   Obese    Onychomycosis 05/09/2011   Oral lesion 08/26/2013   Otitis externa 06/23/2012   left   Perimenopause 03/11/2013   RLS (restless legs syndrome)    RUQ pain 09/20/2011   Sleep apnea 04/21/2014   Tinea corporis 04/26/2017   Unspecified constipation 05/13/2013   Urinary frequency 08/26/2013   Past Surgical History:  Procedure Laterality Date   BACK SURGERY  2006   low, discectomy for herniated disc   BLADDER SUSPENSION N/A 11/07/2012   Procedure: [facility] SLING;  Surgeon: Norleen JINNY Seltzer, MD;  Location: [facility];  Service: Urology;  Laterality: N/A;   CARPAL TUNNEL RELEASE Right 2006   CESAREAN SECTION  1993   CYSTOSCOPY N/A 11/07/2012   Procedure: CYSTOSCOPY;  Surgeon: Norleen JINNY Seltzer, MD;  Location: [facility];  Service: Urology;  Laterality: N/A;   TUBAL LIGATION  1993   Family History  Problem Relation Age of Onset   Cancer Mother 56       thyroid , malignant brain tumor   Arthritis Mother        neck, back   Arthritis Father        back pain   Heart disease Father        stent   Hepatitis Sister        Hepatitis C   Heart disease Sister        stent   Breast cancer Maternal Aunt    Colon polyps  Maternal Grandmother    Diabetes Maternal Grandmother    Colon cancer Neg Hx    Rectal cancer Neg Hx    Stomach cancer Neg Hx    Social History   Socioeconomic History   Marital status: Divorced    Spouse name: Not on file   Number of children: Not on file   Years of education: Not on file   Highest education level: Not on file  Occupational History   Not on file  Tobacco Use   Smoking status: Former    Current packs/day: 0.00  Average packs/day: 0.5 packs/day for 20.0 years (10.0 ttl pk-yrs)    Types: Cigarettes    Start date: 07/12/1972    Quit date: 07/12/1992    Years since quitting: 31.7   Smokeless tobacco: Never  Vaping Use   Vaping status: Never Used  Substance and Sexual Activity   Alcohol use: Not Currently   Drug use: No   Sexual activity: Yes    Partners: Male  Other Topics Concern   Not on file  Social History Narrative   Not on file   Social Drivers of Health   Financial Resource Strain: Low Risk  (04/10/2024)   Overall Financial Resource Strain (CARDIA)    Difficulty of Paying Living Expenses: Not hard at all  Food Insecurity: No Food Insecurity (04/10/2024)   Hunger Vital Sign    Worried About Running Out of Food in the Last Year: Never true    Ran Out of Food in the Last Year: Never true  Transportation Needs: No Transportation Needs (04/10/2024)   PRAPARE - Administrator, Civil Service (Medical): No    Lack of Transportation (Non-Medical): No  Physical Activity: Insufficiently Active (04/10/2024)   Exercise Vital Sign    Days of Exercise per Week: 7 days    Minutes of Exercise per Session: 20 min  Stress: No Stress Concern Present (04/10/2024)   [employer] of Occupational Health - Occupational Stress Questionnaire    Feeling of Stress: Not at all  Social Connections: Moderately Integrated (04/10/2024)   Social Connection and Isolation Panel    Frequency of Communication with Friends and Family: More than three times a week     Frequency of Social Gatherings with Friends and Family: More than three times a week    Attends Religious Services: More than 4 times per year    Active Member of [employer] or Organizations: Yes    Attends Engineer, structural: More than 4 times per year    Marital Status: Divorced    Tobacco Counseling Counseling given: Not Answered    Clinical Intake:  Pre-visit preparation completed: Yes  Pain : No/denies pain     BMI - recorded: 32.96 Nutritional Status: BMI > 30  Obese Nutritional Risks: None Diabetes: No  Lab Results  Component Value Date   HGBA1C 5.8 04/05/2024   HGBA1C 5.5 11/28/2023   HGBA1C 5.6 08/17/2023     How often do you need to have someone help you when you read instructions, pamphlets, or other written materials from your doctor or pharmacy?: 1 - Never  Interpreter Needed?: No  Information entered by :: Rojelio Blush LPN   Activities of Daily Living     04/10/2024    9:10 AM 10/14/2023    2:19 PM  In your present state of health, do you have any difficulty performing the following activities:  Hearing? 0 0  Vision? 0 0  Difficulty concentrating or making decisions? 0 1  Comment  remembering things  Walking or climbing stairs? 0 0  Dressing or bathing? 0 0  Doing errands, shopping? 0 0  Preparing Food and eating ? N N  Using the Toilet? N N  In the past six months, have you accidently leaked urine? N Y  Comment  ocassionally, if bladder is really full  Do you have problems with loss of bowel control? N N  Managing your Medications? N N  Managing your Finances? N N  Housekeeping or managing your Housekeeping? N N    Patient  Care Team: [name], MD as PCP - General (Family Medicine) Debrah Lamar BIRCH, MD (Inactive) as Consulting Physician (Gastroenterology) Geraldene Loving, MD as Consulting Physician (Ophthalmology)  I have updated your Care Teams any recent Medical Services you may have received from other providers in the  past year.     Assessment:   This is a routine wellness examination for [place].  Hearing/Vision screen Hearing Screening - Comments:: Denies hearing difficulties   Vision Screening - Comments:: Wears rx glasses - up to date with routine eye exams with  [facility]   Goals Addressed               This Visit's Progress     Continue physical activity (pt-stated)        Stay active       Depression Screen     04/10/2024    9:08 AM 04/05/2024   11:10 AM 10/14/2023    2:36 PM 04/05/2023   10:02 AM 01/11/2023    1:47 PM 09/20/2022    9:47 AM 09/07/2022   10:37 AM  PHQ 2/9 Scores  PHQ - 2 Score 0 0 0 0 0 0 0  PHQ- 9 Score 0 0  0 0  0    Fall Risk     04/10/2024    9:11 AM 04/05/2024   10:05 AM 10/14/2023    2:23 PM 04/05/2023   10:02 AM 01/11/2023    1:46 PM  Fall Risk   Falls in the past year? 0 0 0 0   Number falls in past yr: 0 0 0 0 0  Injury with Fall? 0 0 0 0 0  Risk for fall due to : No Fall Risks No Fall Risks No Fall Risks  No Fall Risks  Follow up Falls evaluation completed Falls evaluation completed Falls evaluation completed Falls evaluation completed     MEDICARE RISK AT HOME:  Medicare Risk at Home Any stairs in or around the home?: No If so, are there any without handrails?: No Home free of loose throw rugs in walkways, pet beds, electrical cords, etc?: Yes Adequate lighting in your home to reduce risk of falls?: Yes Life alert?: No Use of a cane, walker or w/c?: No Grab bars in the bathroom?: No Shower chair or bench in shower?: No Elevated toilet seat or a handicapped toilet?: No  TIMED UP AND GO:  Was the test performed?  No  Cognitive Function: 6CIT completed        04/10/2024    9:11 AM 10/14/2023    2:37 PM 09/20/2022    9:56 AM  6CIT Screen  What Year? 0 points 0 points 0 points  What month? 0 points 0 points 0 points  What time? 0 points 0 points 0 points  Count back from 20 0 points 0 points 0 points  Months in reverse 0 points 0 points  0 points  Repeat phrase 0 points 0 points 0 points  Total Score 0 points 0 points 0 points    Immunizations Immunization History  Administered Date(s) Administered   Fluad Trivalent(High Dose 65+) 04/05/2023   Influenza Split 05/06/2011, 05/02/2012   Influenza, Seasonal, Injecte, Preservative Fre 04/10/2013, 04/05/2024   Influenza,inj,Quad PF,6+ Mos 04/10/2013, 04/16/2014, 04/18/2015, 04/20/2016, 04/26/2017, 05/09/2018, 04/16/2019, 06/24/2020, 05/04/2021, 05/11/2022   PFIZER(Purple Top)SARS-COV-2 Vaccination 04/22/2020, 05/13/2020   PPD Test 10/09/2013   Pneumococcal Conjugate-13 04/10/2013   Pneumococcal Polysaccharide-23 04/26/2017   Tdap 11/10/2010, 05/11/2022   Zoster Recombinant(Shingrix ) 04/05/2023  Screening Tests Health Maintenance  Topic Date Due   Pneumococcal Vaccine: 50+ Years (3 of 3 - PCV20 or PCV21) 04/26/2022   Zoster Vaccines- Shingrix  (2 of 2) 05/31/2023   Medicare Annual Wellness (AWV)  04/10/2025   Mammogram  04/17/2025   Fecal DNA (Cologuard)  05/23/2026   DTaP/Tdap/Td (3 - Td or Tdap) 05/11/2032   Influenza Vaccine  Completed   DEXA SCAN  Completed   Hepatitis C Screening  Completed   HPV VACCINES  Aged Out   Meningococcal B Vaccine  Aged Out   Colonoscopy  Discontinued   COVID-19 Vaccine  Discontinued    Health Maintenance Items Addressed:   Additional Screening:  Vision Screening: Recommended annual ophthalmology exams for early detection of glaucoma and other disorders of the eye. Is the patient up to date with their annual eye exam?  Yes  Who is the provider or what is the name of the office in which the patient attends annual eye exams? [facility]  Dental Screening: Recommended annual dental exams for proper oral hygiene  Community Resource Referral / Chronic Care Management: CRR required this visit?  No   CCM required this visit?  No   Plan:    I have personally reviewed and noted the following in the patient's chart:    Medical and social history Use of alcohol, tobacco or illicit drugs  Current medications and supplements including opioid prescriptions. Patient is not currently taking opioid prescriptions. Functional ability and status Nutritional status Physical activity Advanced directives List of other physicians Hospitalizations, surgeries, and ER visits in previous 12 months Vitals Screenings to include cognitive, depression, and falls Referrals and appointments  In addition, I have reviewed and discussed with patient certain preventive protocols, quality metrics, and best practice recommendations. A written personalized care plan for preventive services as well as general preventive health recommendations were provided to patient.   Rojelio LELON Blush, LPN   [id number]   After Visit Summary: (MyChart) Due to this being a telephonic visit, the after visit summary with patients personalized plan was offered to patient via MyChart   Notes: Nothing significant to report at this time.

## 2024-04-10 NOTE — Patient Instructions (Addendum)
 [name],  Thank you for taking the time for your Medicare Wellness Visit. I appreciate your continued commitment to your health goals. Please review the care plan we discussed, and feel free to reach out if I can assist you further.  Medicare recommends these wellness visits once per year to help you and your care team stay ahead of potential health issues. These visits are designed to focus on prevention, allowing your provider to concentrate on managing your acute and chronic conditions during your regular appointments.  Please note that Annual Wellness Visits do not include a physical exam. Some assessments may be limited, especially if the visit was conducted virtually. If needed, we may recommend a separate in-person follow-up with your provider.  Ongoing Care Seeing your primary care provider every 3 to 6 months helps us  monitor your health and provide consistent, personalized care.   Referrals If a referral was made during today's visit and you haven't received any updates within two weeks, please contact the referred provider directly to check on the status.  Recommended Screenings:  Health Maintenance  Topic Date Due   Pneumococcal Vaccine for age over 68 (3 of 3 - PCV20 or PCV21) 04/26/2022   Zoster (Shingles) Vaccine (2 of 2) 05/31/2023   Medicare Annual Wellness Visit  04/10/2025   Breast Cancer Screening  04/17/2025   Cologuard (Stool DNA test)  05/23/2026   DTaP/Tdap/Td vaccine (3 - Td or Tdap) 05/11/2032   Flu Shot  Completed   DEXA scan (bone density measurement)  Completed   Hepatitis C Screening  Completed   HPV Vaccine  Aged Out   Meningitis B Vaccine  Aged Out   Colon Cancer Screening  Discontinued   COVID-19 Vaccine  Discontinued       04/10/2024    9:11 AM  Advanced Directives  Does Patient Have a Medical Advance Directive? No  Would patient like information on creating a medical advance directive? No - Patient declined   Advance Care Planning is  important because it: Ensures you receive medical care that aligns with your values, goals, and preferences. Provides guidance to your family and loved ones, reducing the emotional burden of decision-making during critical moments.  Vision: Annual vision screenings are recommended for early detection of glaucoma, cataracts, and diabetic retinopathy. These exams can also reveal signs of chronic conditions such as diabetes and high blood pressure.  Dental: Annual dental screenings help detect early signs of oral cancer, gum disease, and other conditions linked to overall health, including heart disease and diabetes.  Please see the attached documents for additional preventive care recommendations.

## 2024-04-13 ENCOUNTER — Telehealth: Payer: Self-pay | Admitting: *Deleted

## 2024-04-13 ENCOUNTER — Ambulatory Visit (INDEPENDENT_AMBULATORY_CARE_PROVIDER_SITE_OTHER)

## 2024-04-13 ENCOUNTER — Ambulatory Visit: Admitting: Podiatry

## 2024-04-13 ENCOUNTER — Encounter: Payer: Self-pay | Admitting: Podiatry

## 2024-04-13 DIAGNOSIS — M79671 Pain in right foot: Secondary | ICD-10-CM | POA: Diagnosis not present

## 2024-04-13 DIAGNOSIS — M7989 Other specified soft tissue disorders: Secondary | ICD-10-CM | POA: Diagnosis not present

## 2024-04-13 DIAGNOSIS — L84 Corns and callosities: Secondary | ICD-10-CM

## 2024-04-13 DIAGNOSIS — M2041 Other hammer toe(s) (acquired), right foot: Secondary | ICD-10-CM

## 2024-04-13 DIAGNOSIS — M79672 Pain in left foot: Secondary | ICD-10-CM | POA: Diagnosis not present

## 2024-04-13 DIAGNOSIS — M19072 Primary osteoarthritis, left ankle and foot: Secondary | ICD-10-CM | POA: Diagnosis not present

## 2024-04-13 DIAGNOSIS — M19071 Primary osteoarthritis, right ankle and foot: Secondary | ICD-10-CM | POA: Diagnosis not present

## 2024-04-13 DIAGNOSIS — M7732 Calcaneal spur, left foot: Secondary | ICD-10-CM | POA: Diagnosis not present

## 2024-04-13 DIAGNOSIS — M7731 Calcaneal spur, right foot: Secondary | ICD-10-CM | POA: Diagnosis not present

## 2024-04-13 NOTE — Telephone Encounter (Signed)
 I left [name] a message asking her to come in a few minutes early for her appointment today because we're going to need xrays of her feet.  I asked her to stop by imaging first then come to us  afterwards.

## 2024-04-13 NOTE — Progress Notes (Signed)
  Subjective:  Patient ID: [name], female    DOB: [birth year],   MRN: [id number]  Chief Complaint  Patient presents with   Callouses    I need to get these calluses taken off.  (Hallux b/l )    68 y.o. female presents for concern of bilateral calluses on great toes that have been present for years. They have recently worsened and causing pain. She has tried filling them down herself but have gotten too thick. She is not diabetic and not on blood thinners . Denies any other pedal complaints. Denies n/v/f/c.   Past Medical History:  Diagnosis Date   Acute sinusitis 03/27/2013   Allergy     seasonal   Anxiety    Arthritis 06/09/2013   Depression    Dyspareunia    Edema extremities    Encounter for preventative adult health care exam with abnormal findings 04/21/2014   GERD (gastroesophageal reflux disease)    Hip pain, left 11/01/2011   History of proteinuria syndrome    Hyperlipidemia    Insomnia 03/11/2013   Low back pain 05/09/2011   Lymphadenitis 06/16/2012   Myalgia and myositis 02/15/2015   Obese    Onychomycosis 05/09/2011   Oral lesion 08/26/2013   Otitis externa 06/23/2012   left   Perimenopause 03/11/2013   RLS (restless legs syndrome)    RUQ pain 09/20/2011   Sleep apnea 04/21/2014   Tinea corporis 04/26/2017   Unspecified constipation 05/13/2013   Urinary frequency 08/26/2013    Objective:  Physical Exam: Vascular: DP/PT pulses 2/4 bilateral. CFT <3 seconds. Normal hair growth on digits. No edema.  Skin. No lacerations or abrasions bilateral feet. Hyperkeratotic cored lesions noted to medial hallux bilateral.  Musculoskeletal: MMT 5/5 bilateral lower extremities in DF, PF, Inversion and Eversion. Deceased ROM in DF of ankle joint.  Neurological: Sensation intact to light touch.   Assessment:   1. Callus      Plan:  Patient was evaluated and treated and all questions answered. -Discussed benign skin lesions with patient and treatment options.   -Hyperkeratotic tissue was debrided with chisel without incident as courtesy.  -Encouraged daily moisturizing -Discussed use of pumice stone -Advised good supportive shoes and inserts -Patient to return to office as needed or sooner if condition worsens.   [place]berry Failing, DPM

## 2024-04-18 ENCOUNTER — Telehealth: Payer: Self-pay | Admitting: *Deleted

## 2024-04-18 NOTE — Telephone Encounter (Signed)
 Had form left on desk for patient.  Not sure if she did get stockings or not.  Usually Dr. Domenica gives out pamphlet from them.  Left message on machine to call back to let us  know if she got them or not.

## 2024-04-18 NOTE — Telephone Encounter (Unsigned)
 Copied from CRM [id number]. Topic: General - Other >> Apr 18, 2024  9:34 AM Frederich PARAS wrote: Reason for CRM: pt called due to missed call. This Message is  to Estelle Gillis D,  pt received the prescription for the compression stockings but just have not went and gotten them yet

## 2024-04-23 ENCOUNTER — Encounter (HOSPITAL_BASED_OUTPATIENT_CLINIC_OR_DEPARTMENT_OTHER): Payer: Self-pay

## 2024-04-23 ENCOUNTER — Ambulatory Visit (HOSPITAL_BASED_OUTPATIENT_CLINIC_OR_DEPARTMENT_OTHER)
Admission: RE | Admit: 2024-04-23 | Discharge: 2024-04-23 | Disposition: A | Discharge status: Home / Self Care | Source: Ambulatory Visit | Attending: Family Medicine | Admitting: Family Medicine

## 2024-04-23 DIAGNOSIS — Z1231 Encounter for screening mammogram for malignant neoplasm of breast: Secondary | ICD-10-CM | POA: Diagnosis not present

## 2024-04-24 DIAGNOSIS — H52223 Regular astigmatism, bilateral: Secondary | ICD-10-CM | POA: Diagnosis not present

## 2024-04-26 DIAGNOSIS — K080 Exfoliation of teeth due to systemic causes: Secondary | ICD-10-CM | POA: Diagnosis not present

## 2024-05-04 ENCOUNTER — Encounter (INDEPENDENT_AMBULATORY_CARE_PROVIDER_SITE_OTHER): Payer: Self-pay

## 2024-05-15 ENCOUNTER — Other Ambulatory Visit: Payer: Self-pay | Admitting: Family Medicine

## 2024-05-28 ENCOUNTER — Telehealth: Payer: Self-pay | Admitting: Family Medicine

## 2024-05-28 ENCOUNTER — Encounter: Payer: Self-pay | Admitting: Family Medicine

## 2024-05-28 NOTE — Telephone Encounter (Signed)
 Copied from CRM #[id number]. Topic: General - Other >> May 28, 2024 10:23 AM [name] wrote: Reason for CRM:  Patient is requesting a excuse for jury duty. unable to serve for jury duty due to a mental disability, anxiety. She needs this by December 15th. Can someone fu with patient?    [employer] Attn to: [address]: [address] [place], [place] [address]

## 2024-05-28 NOTE — Telephone Encounter (Signed)
 Copied from CRM #[id number]. Topic: General - Call Back - No Documentation >> May 28, 2024  2:04 PM Mercedes MATSU wrote: Reason for CRM: Patient called in requesting to speak with [facility]. She states she had a missed call but there was no documentation in the patients chart. Patient is requesting a call back and can be reached at [phone].

## 2024-05-28 NOTE — Telephone Encounter (Signed)
 Spoke with pt and received information needed for the letter. Pt is aware letter was printed and waiting on Dr. Domenica signature. She will come pick up letter tomorrow,05/29/2024.

## 2024-06-04 ENCOUNTER — Other Ambulatory Visit: Payer: Self-pay | Admitting: Family

## 2024-06-11 NOTE — Telephone Encounter (Signed)
 Requesting: lorazepam  0.5mg   Contract: 11/28/23 UDS: 11/28/23 Last Visit: 04/05/24 Next Visit: 08/16/24 Last Refill: 10/20/23 #30 and 1RF   Please Advise

## 2024-06-12 ENCOUNTER — Ambulatory Visit: Admitting: Allergy

## 2024-06-12 ENCOUNTER — Other Ambulatory Visit: Payer: Self-pay

## 2024-06-12 ENCOUNTER — Encounter: Payer: Self-pay | Admitting: Allergy

## 2024-06-12 VITALS — BP 128/86 | HR 65 | Temp 98.4°F | Resp 16 | Wt 190.8 lb

## 2024-06-12 DIAGNOSIS — R053 Chronic cough: Secondary | ICD-10-CM

## 2024-06-12 DIAGNOSIS — B999 Unspecified infectious disease: Secondary | ICD-10-CM | POA: Diagnosis not present

## 2024-06-12 DIAGNOSIS — K219 Gastro-esophageal reflux disease without esophagitis: Secondary | ICD-10-CM

## 2024-06-12 DIAGNOSIS — J310 Chronic rhinitis: Secondary | ICD-10-CM

## 2024-06-12 DIAGNOSIS — J018 Other acute sinusitis: Secondary | ICD-10-CM

## 2024-06-12 MED ORDER — PREDNISONE 10 MG PO TABS: ORAL_TABLET | ORAL | 0 refills | Status: AC

## 2024-06-12 MED ORDER — AMOXICILLIN-POT CLAVULANATE 875-125 MG PO TABS: 1.0000 | ORAL_TABLET | Freq: Two times a day (BID) | ORAL | 0 refills | Status: AC

## 2024-06-12 NOTE — Progress Notes (Signed)
 Follow Up Note  RE: [name] MRN: [id number] DOB: 09/26/1955 Date of Office Visit: 06/12/2024  Referring provider: [name], MD Primary care provider: [name], MD  Chief Complaint: Other (Same day add on sinus issues x 2 weeks. She had to cancel trip to [place])  History of Present Illness: I had the pleasure of seeing [name] for a follow up visit at the [facility] on 06/12/2024. She is a 68 y.o. female, who is being followed for chronic rhinitis, recurrent infections, cough, GERD. Her previous allergy  office visit was on 01/12/2024 with Dr. Luke. Today is a new complaint visit of sinus issues.  Discussed the use of AI scribe software for clinical note transcription with the patient, who gave verbal consent to proceed.    She has been experiencing symptoms for the past two weeks, with progressive worsening. Initially, she had nasal congestion, difficulty breathing, sinus pressure, burning sensations, and headaches. She noted nasal discharge with coloration and described a low-grade fever a few days ago that resolved spontaneously. No recent exposure to sick individuals.  For symptom management, she uses Flonase  nasal spray, two sprays per nostril, and saline nasal spray. She takes Mucinex  tablets once daily and Mucinex  nighttime cough syrup at night, effectively using both forms twice a day. She has a history of chronic cough persisting for two years, which is less severe now, but her sinus issues have worsened.  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 They did not do a rhinoscopy on her.   She has not received a pneumonia shot recently, although she recalls receiving it in 2018 and 2014. She has received her flu shot. She is not allergic to prednisone  or Augmentin  and has taken Augmentin  without issues in the past. She continues to take medication for  heartburn/reflux.  No new medications, surgeries, or medical diagnoses have been introduced since her last visit.     2025 labs: Your immunoglobulin levels were normal which is great. You also have good protection against diptheria and tetanus.   However, your pneumococcal titers were low. Sometimes people with low titers are more likely to develop respiratory infections caused by the bacteria strep pneumoniae. I would like for you to get the pneumovax 23 vaccine (also known as the pneumonia shot) as it can boost the levels and offer protection against this bacteria in the future. Once you get the vaccine, we check the levels 4 weeks afterwards to make sure your immune system responded to the vaccine appropriately. You can get the pneumovax vaccine at your PCP's office or pharmacy. If they don't offer it there, let us  know and in certain cases we have given them in our office.    Make sure it's the pneumovax 23 vaccine and NOT the prevnar.    Your environmental panel was negative to indoor/outdoor allergens. You do not have positive test results that would qualify you for allergy  injections at this time. Keep ENT appointment.  You may still use Flonase  (fluticasone ) nasal spray as needed.  Assessment and Plan: [name] is a 68 y.o. female with: Chronic sinusitis with acute exacerbation Chronic sinusitis exacerbated with nasal congestion, sinus pressure, burning, and headaches. Low-grade fever resolved.  See below for symptomatic management. Take mucinex  twice a day with plenty of water .  Start prednisone  taper. Prednisone  10mg  tablets - take 2 tablets for 4 days then 1 tablet on  day 5.   Start Augmentin  875mg  twice a day for 10 days.  Take with probiotics.   Chronic rhinitis Past history - Chronic allergic rhinitis with exacerbations in fall and spring. Previous allergy  shots were beneficial. Current medications include Claritin, Mucinex , and Flonase . 2025 skin testing negative to  indoor/outdoor allergens. 2025 labs negative to environmental panel. Use Flonase  (fluticasone ) nasal spray 1-2 sprays per nostril once a day as needed for nasal congestion.  Nasal saline spray (i.e., Simply Saline) or nasal saline lavage (i.e., NeilMed) is recommended as needed and prior to medicated nasal sprays. Recommend ENT evaluation for the coughing and chronic non-allergic rhinitis   Chronic cough Past history - Uses albuterol  1-2 times per week with good benefit. Cough is most likely multifactorial. 2025 spirometry was unremarkable. Interim history - Unchanged. May use Airsupra  rescue inhaler 2 puffs every 4 to 6 hours as needed for shortness of breath, chest tightness, coughing, and wheezing. Do not use more than 12 puffs in 24 hours. May use Airsupra  rescue inhaler 2 puffs 5 to 15 minutes prior to strenuous physical activities. Rinse mouth after each use.  Monitor frequency of use - if you need to use it more than twice per week on a consistent basis let us  know.    Recurrent infections Past history - 2025 labs normal immunoglobulin levels, poor pneumococcal titers.  Keep track of infections and antibiotics use. Get pneumonia shot once you feel better.   Gastroesophageal reflux disease, unspecified whether esophagitis present Continue lifestyle and dietary modifications. Continue omeprazole  40mg  once day - nothing to eat or drink for 20-30 minutes afterwards.   Return in about 4 months (around 10/11/2024).  Meds ordered this encounter  Medications   amoxicillin -clavulanate (AUGMENTIN ) 875-125 MG tablet    Sig: Take 1 tablet by mouth 2 (two) times daily.    Dispense:  20 tablet    Refill:  0   predniSONE  (DELTASONE ) 10 MG tablet    Sig: Start prednisone  taper. Prednisone  10mg  tablets - take 2 tablets for 4 days then 1 tablet on day 5.    Dispense:  9 tablet    Refill:  0   Lab Orders  No laboratory test(s) ordered today    Diagnostics: None.   Medication List:  Current  Outpatient Medications  Medication Sig Dispense Refill   acetaminophen  (TYLENOL ) 500 MG tablet Take 1,000 mg by mouth every 6 (six) hours as needed for headache or mild pain.     albuterol  (VENTOLIN  HFA) 108 (90 Base) MCG/ACT inhaler Inhale 2 puffs into the lungs every 4 (four) hours as needed for wheezing or shortness of breath (coughing fits). 18 g 1   amoxicillin -clavulanate (AUGMENTIN ) 875-125 MG tablet Take 1 tablet by mouth 2 (two) times daily. 20 tablet 0   Ascorbic Acid (VITAMIN C PO) Take 1 tablet by mouth daily.     Bacillus Coagulans-Inulin (PROBIOTIC) 1-250 BILLION-MG CAPS Take by mouth.     baclofen  (LIORESAL ) 20 MG tablet Take 1 tablet (20 mg total) by mouth at bedtime as needed for muscle spasms. 90 tablet 1   buPROPion  (WELLBUTRIN  XL) 300 MG 24 hr tablet Take 1 tablet (300 mg total) by mouth daily. 90 tablet 1   busPIRone  (BUSPAR ) 7.5 MG tablet TAKE 1 TABLET(7.5 MG) BY MOUTH TWICE DAILY. 180 tablet 1   famotidine  (PEPCID ) 40 MG tablet Take 1 tablet (40 mg total) by mouth at bedtime. 90 tablet 3   fluticasone  (FLONASE ) 50 MCG/ACT nasal spray Place 2 sprays into both  nostrils daily as needed for allergies or rhinitis. 16 g 3   hydrOXYzine  (VISTARIL ) 25 MG capsule Take 1 capsule (25 mg total) by mouth daily. 90 capsule 1   LORazepam  (ATIVAN ) 0.5 MG tablet TAKE 1 TABLET BY MOUTH DAILY AS NEEDED FOR SEVERE ANXIETY 30 tablet 3   omeprazole  (PRILOSEC) 40 MG capsule TAKE 1 CAPSULE (40 MG TOTAL) BY MOUTH DAILY. 90 capsule 3   potassium chloride  SA (KLOR-CON  M) 20 MEQ tablet TAKE 1 TABLET BY MOUTH DAILY. TAKE 2ND TABLET DAILY AS NEEDED 180 tablet 0   predniSONE  (DELTASONE ) 10 MG tablet Start prednisone  taper. Prednisone  10mg  tablets - take 2 tablets for 4 days then 1 tablet on day 5. 9 tablet 0   promethazine  (PHENERGAN ) 12.5 MG tablet TAKE 1 TABLET(12.5 MG) BY MOUTH EVERY 8 HOURS AS NEEDED FOR NAUSEA OR VOMITING 30 tablet 5   rOPINIRole  (REQUIP ) 1 MG tablet Take 1 tablet by mouth at bedtime  90 tablet 3   rosuvastatin  (CRESTOR ) 40 MG tablet TAKE 1 TABLET(40 MG) BY MOUTH DAILY 90 tablet 0   torsemide  (DEMADEX ) 20 MG tablet Take 1 tablet (20 mg total) by mouth daily as needed. 90 tablet 1   traZODone  (DESYREL ) 50 MG tablet Take 1-2 tablets (50-100 mg total) by mouth at bedtime as needed for sleep. 120 tablet 1   VITAMIN D  PO Take 1 tablet by mouth daily.     No current facility-administered medications for this visit.   Allergies: Allergies  Allergen Reactions   Elemental Sulfur Diarrhea and Nausea And Vomiting   Carbamazepine Hives   Ceclor [Cefaclor] Other (See Comments)    Tongue swell   Morphine  And Codeine  Itching   Atorvastatin  Other (See Comments)    Leg pain   I reviewed her past medical history, social history, family history, and environmental history and no significant changes have been reported from her previous visit.  Review of Systems  Constitutional:  Negative for appetite change, chills, fever and unexpected weight change.  HENT:  Positive for congestion. Negative for rhinorrhea.   Eyes:  Negative for itching.  Respiratory:  Positive for cough. Negative for chest tightness, shortness of breath and wheezing.   Cardiovascular:  Negative for chest pain.  Gastrointestinal:  Negative for abdominal pain.  Genitourinary:  Negative for difficulty urinating.  Allergic/Immunologic: Negative for environmental allergies.  Neurological:  Positive for headaches.    Objective: BP 128/86   Pulse 65   Temp 98.4 F (36.9 C) (Temporal)   Resp 16   Wt 190 lb 12.8 oz (86.5 kg)   LMP 10/03/2012   SpO2 96%   BMI 32.75 kg/m  Body mass index is 32.75 kg/m. Physical Exam Vitals and nursing note reviewed.  Constitutional:      Appearance: Normal appearance. She is well-developed.  HENT:     Head: Normocephalic and atraumatic.     Right Ear: Tympanic membrane and external ear normal.     Left Ear: Tympanic membrane and external ear normal.     Nose: Nose normal.      Mouth/Throat:     Mouth: Mucous membranes are moist.     Pharynx: Oropharynx is clear.  Eyes:     Conjunctiva/sclera: Conjunctivae normal.  Cardiovascular:     Rate and Rhythm: Normal rate and regular rhythm.     Heart sounds: Normal heart sounds. No murmur heard.    No friction rub. No gallop.  Pulmonary:     Effort: Pulmonary effort is normal.  Breath sounds: Normal breath sounds. No wheezing, rhonchi or rales.  Musculoskeletal:     Cervical back: Neck supple.  Skin:    General: Skin is warm.     Findings: No rash.  Neurological:     Mental Status: She is alert and oriented to person, place, and time.  Psychiatric:        Behavior: Behavior normal.    Previous notes and tests were reviewed. The plan was reviewed with the patient/family, and all questions/concerned were addressed.  It was my pleasure to see Ashante today and participate in her care. Please feel free to contact me with any questions or concerns.  Sincerely,  Orlan Cramp, DO Allergy  & Immunology  [facility]  [place] office: [phone] [facility] [place] office: [phone]

## 2024-06-12 NOTE — Patient Instructions (Addendum)
 Possible sinus infections See below for symptomatic management. Take mucinex  twice a day with plenty of water .  Start prednisone  taper. Prednisone  10mg  tablets - take 2 tablets for 4 days then 1 tablet on day 5.   Start Augmentin  875mg  twice a day for 10 days.  Take with probiotics.   Rhinitis  Use Flonase  (fluticasone ) nasal spray 1-2 sprays per nostril once a day as needed for nasal congestion.  Nasal saline spray (i.e., Simply Saline) or nasal saline lavage (i.e., NeilMed) is recommended as needed and prior to medicated nasal sprays. Recommend ENT evaluation for the coughing and chronic non-allergic rhinitis  Coughing May use Airsupra  rescue inhaler 2 puffs every 4 to 6 hours as needed for shortness of breath, chest tightness, coughing, and wheezing. Do not use more than 12 puffs in 24 hours. May use Airsupra  rescue inhaler 2 puffs 5 to 15 minutes prior to strenuous physical activities. Rinse mouth after each use.  Monitor frequency of use - if you need to use it more than twice per week on a consistent basis let us  know.   Infections Keep track of infections and antibiotics use. Get pneumonia shot once you feel better.   GERD Continue lifestyle and dietary modifications. Continue omepreazole 40mg  once day - nothing to eat or drink for 20-30 minutes afterwards.   Follow up in 4 months or sooner if needed  Drink plenty of fluids. Water , juice, clear broth or warm lemon water  are good choices. Avoid caffeine and alcohol, which can dehydrate you. Eat chicken soup. Chicken soup and other warm fluids can be soothing and loosen congestion. Rest. Adjust your room's temperature and humidity. Keep your room warm but not overheated. If the air is dry, a cool-mist humidifier or vaporizer can moisten the air and help ease congestion and coughing. Keep the humidifier clean to prevent the growth of bacteria and molds. Soothe your throat. Perform a saltwater gargle. Dissolve one-quarter to a  half teaspoon of salt in a 4- to 8-ounce glass of warm water . This can relieve a sore or scratchy throat temporarily. Use saline nasal drops. To help relieve nasal congestion, try saline nasal drops. You can buy these drops over the counter, and they can help relieve symptoms ? even in children. Take over-the-counter cold and cough medications. For adults and children older than 5, over-the-counter decongestants, antihistamines and pain relievers might offer some symptom relief. However, they won't prevent a cold or shorten its duration.

## 2024-06-19 ENCOUNTER — Ambulatory Visit (HOSPITAL_BASED_OUTPATIENT_CLINIC_OR_DEPARTMENT_OTHER)
Admission: RE | Admit: 2024-06-19 | Discharge: 2024-06-19 | Disposition: A | Source: Ambulatory Visit | Attending: Medical | Admitting: Medical

## 2024-06-19 ENCOUNTER — Ambulatory Visit: Payer: Self-pay

## 2024-06-19 ENCOUNTER — Encounter: Payer: Self-pay | Admitting: Medical

## 2024-06-19 ENCOUNTER — Ambulatory Visit: Payer: Self-pay | Admitting: Medical

## 2024-06-19 ENCOUNTER — Ambulatory Visit: Admitting: Medical

## 2024-06-19 VITALS — BP 112/70 | HR 75 | Resp 15 | Ht 64.0 in | Wt 191.8 lb

## 2024-06-19 DIAGNOSIS — R600 Localized edema: Secondary | ICD-10-CM | POA: Diagnosis not present

## 2024-06-19 DIAGNOSIS — M79662 Pain in left lower leg: Secondary | ICD-10-CM

## 2024-06-19 DIAGNOSIS — R42 Dizziness and giddiness: Secondary | ICD-10-CM

## 2024-06-19 DIAGNOSIS — G4489 Other headache syndrome: Secondary | ICD-10-CM

## 2024-06-19 DIAGNOSIS — R0600 Dyspnea, unspecified: Secondary | ICD-10-CM

## 2024-06-19 DIAGNOSIS — R7989 Other specified abnormal findings of blood chemistry: Secondary | ICD-10-CM

## 2024-06-19 LAB — COMPREHENSIVE METABOLIC PANEL WITH GFR
ALT: 18 U/L (ref 0–35)
AST: 15 U/L (ref 0–37)
Albumin: 4.8 g/dL (ref 3.5–5.2)
Alkaline Phosphatase: 67 U/L (ref 39–117)
BUN: 15 mg/dL (ref 6–23)
CO2: 30 meq/L (ref 19–32)
Calcium: 9.4 mg/dL (ref 8.4–10.5)
Chloride: 101 meq/L (ref 96–112)
Creatinine, Ser: 0.87 mg/dL (ref 0.40–1.20)
GFR: 69.41 mL/min (ref >60.00)
Glucose, Bld: 102 mg/dL — ABNORMAL HIGH (ref 70–99)
Potassium: 3.7 meq/L (ref 3.5–5.1)
Sodium: 139 meq/L (ref 135–145)
Total Bilirubin: 0.7 mg/dL (ref 0.2–1.2)
Total Protein: 7.2 g/dL (ref 6.0–8.3)

## 2024-06-19 LAB — CBC WITH DIFFERENTIAL/PLATELET
Basophils Absolute: 0.1 K/uL (ref 0.0–0.1)
Basophils Relative: 0.5 % (ref 0.0–3.0)
Eosinophils Absolute: 0.2 K/uL (ref 0.0–0.7)
Eosinophils Relative: 2 % (ref 0.0–5.0)
HCT: 46.1 % — ABNORMAL HIGH (ref 36.0–46.0)
Hemoglobin: 15 g/dL (ref 12.0–15.0)
Lymphocytes Relative: 32 % (ref 12.0–46.0)
Lymphs Abs: 3 K/uL (ref 0.7–4.0)
MCHC: 32.5 g/dL (ref 30.0–36.0)
MCV: 87.9 fl (ref 78.0–100.0)
Monocytes Absolute: 0.5 K/uL (ref 0.1–1.0)
Monocytes Relative: 5.7 % (ref 3.0–12.0)
Neutro Abs: 5.5 K/uL (ref 1.4–7.7)
Neutrophils Relative %: 59.8 % (ref 43.0–77.0)
Platelets: 277 K/uL (ref 150.0–400.0)
RBC: 5.24 Mil/uL — ABNORMAL HIGH (ref 3.87–5.11)
RDW: 13.4 % (ref 11.5–15.5)
WBC: 9.3 K/uL (ref 4.0–10.5)

## 2024-06-19 LAB — TROPONIN I (HIGH SENSITIVITY): High Sens Troponin I: 3 ng/L (ref 2–17)

## 2024-06-19 LAB — BRAIN NATRIURETIC PEPTIDE: Pro B Natriuretic peptide (BNP): 10 pg/mL (ref 0.0–100.0)

## 2024-06-19 NOTE — Progress Notes (Signed)
 Subjective:    Patient ID: [name], female    DOB: 09/04/1955, 68 y.o.   MRN: [id number]  HPI [name] is a 68 year old female who presents with dizziness, shortness of breath, and headaches.  She reports dizziness, shortness of breath, and  frontal sinus headaches for about one year. Dizziness is a brief lightheadedness with position changes such as standing from sitting, lasting about five seconds and resolving with sitting. She nearly loses balance when stepping up on examning table. But regained balance quickly. Shortness of breath has no associated chest pain. Occurs daily and recent states about 6 lbs over past week.  She has chronic sinusitis and rhinitis and was recently treated with Augmentin  and a short prednisone  course. This improved her sleep and reduced frontal, pressure-like headaches that worsened with bending down.  Her antidepressant dose was recently increased from 150 mg to 300 mg recently, and she is concerned it may contribute to her symptoms though her symptoms have been present for a year and therefore explained to patient I don't think wellbutrin  causing her symptoms. She intermittently takes triamterene for foot lymphedema and recently for bilateral leg swelling, worse on the left. She uses compression stockings and elevates her feet.  Over the past week she noted weight gain from 186 to 192 pounds, with more left-sided swelling and muscle spasms in her foot.     Review of Systems See hpi    Objective:   Physical Exam  General Mental Status- Alert. General Appearance- Not in acute distress.   Skin General: Color- Normal Color. Moisture- Normal Moisture.  Neck Carotid Arteries- Normal color. Moisture- Normal Moisture. No carotid bruits. No JVD.  Chest and Lung Exam Auscultation: Breath Sounds:-Normal.  Cardiovascular Auscultation:Rythm- Regular. Murmurs & Other Heart Sounds:Auscultation of the heart reveals- No  Murmurs.  Abdomen Inspection:-Inspeection Normal. Palpation/Percussion:Note:No mass. Palpation and Percussion of the abdomen reveal- Non Tender, Non Distended + BS, no rebound or guarding.    Neurologic Cranial Nerve exam:- CN III-XII intact(No nystagmus), symmetric smile. Drift Test:- No drift. Romberg Exam:- Negative.  Heal to Toe Gait exam:-Normal. Finger to Nose:- Normal/Intact Strength:- 5/5 equal and symmetric strength both upper and lower extremities.   Heent- faint frontal sinus pressure to palpation. No maxillary sinus pressure.   Rt leg/calf not swollen Left leg- calf mild swollen. Foot mild swollen. Calf mild tender to palpation.    Assessment & Plan:  [phone] Dizziness and orthostatic hypotension Likely due to low blood pressure and possible dehydration, exacerbated by Triamterene. - Ordered blood work for blood volume, electrolytes, kidney function, and liver enzymes. - Advised hydration with [employer] water  or sugar-free Gatorade. - Instructed daily blood pressure monitoring and reporting via MyChart or call on friday morning - Advised to avoid diuretics if possible.  Dyspnea Chronic dyspnea possibly related to heart failure, with left lower extremity edema and shortness of breath. EKG shows sinus rhythm with improved T wave changes. - Ordered chest x-ray stat - Ordered BNP test stat - Ordered troponin .(stat) - Advised emergency department visit if troponin test is positive. Will follow and notify you of results.   Headache syndrome secondary to chronic sinusitis Chronic sinusitis with frontal headache improved with antibiotics. Nasal discharge resolved with Augmentin . - Continue current antibiotic treatment as prescribed by allergist.  Left lower extremity edema and pain Chronic edema and pain, more pronounced on the left side, with recent exacerbation. Compression stockings and leg elevation provide relief. - Ordered ultrasound of the left  lower  extremity to assess for swelling and calf pain. - Advised continued use of compression stockings and leg elevation.  Evaluation for heart failure Due to chronic dyspnea and left lower extremity edema. - Ordered BNP test. - Ordered troponin test. - Ordered chest x-ray.  Prediabetes Blood sugar levels in the prediabetic range. - Advised to consume sugar-free beverages to manage blood sugar levels.  Follow up date to be determined after lab review  [name], PA-C   I personally spent a total of 45 minutes in the care of the patient today including performing a medically appropriate exam/evaluation, counseling and educating, placing orders, and documenting clinical information in the EHR.

## 2024-06-19 NOTE — Telephone Encounter (Signed)
 Appt scheduled

## 2024-06-19 NOTE — Telephone Encounter (Signed)
 FYI Only or Action Required?: FYI only for provider: appointment scheduled on 06/19/24.  Patient was last seen in primary care on 04/05/2024 by [name], MD.  Called Nurse Triage reporting Breathing Problem.  Symptoms began about a month ago.  Interventions attempted: Nothing.  Symptoms are: unchanged.  Triage Disposition: See PCP When Office is Open (Within 3 Days)  Patient/caregiver understands and will follow disposition?: Yes    Copied from CRM #[id number]. Topic: Clinical - Red Word Triage >> Jun 19, 2024  8:06 AM [name] wrote: [facility] that prompted transfer to Nurse Triage: Patient is dizzy and having shortness of breath. Has been going on for months now symptoms are preventing her from daily activities Reason for Disposition  [1] MODERATE longstanding difficulty breathing (e.g., speaks in phrases, SOB even at rest, pulse 100-120) AND [2] SAME as normal  Answer Assessment - Initial Assessment Questions Scheduled with alt provider, 06/19/24.  Advised call back and 911 if symptoms worsen.  1. RESPIRATORY STATUS: Describe your breathing? (e.g., wheezing, shortness of breath, unable to speak, severe coughing)      Sob with exertion, dizziness intermittent, fatigue 2. ONSET: When did this breathing problem begin?      Months ago 3. PATTERN Does the difficult breathing come and go, or has it been constant since it started?      Comes and goes 4. SEVERITY: How bad is your breathing? (e.g., mild, moderate, severe)      no 5. RECURRENT SYMPTOM: Have you had difficulty breathing before? If Yes, ask: When was the last time? and What happened that time?      yes 6. CARDIAC HISTORY: Do you have any history of heart disease? (e.g., heart attack, angina, bypass surgery, angioplasty)      no 7. LUNG HISTORY: Do you have any history of lung disease?  (e.g., pulmonary embolus, asthma, emphysema)     no 8. CAUSE: What do you think is causing the breathing  problem?      Increase Wellbutrin  9. OTHER SYMPTOMS: Do you have any other symptoms? (e.g., chest pain, cough, dizziness, fever, runny nose) Cough(chronic cough)  Protocols used: Breathing Difficulty-A-AH

## 2024-06-19 NOTE — Patient Instructions (Addendum)
 Dizziness and orthostatic hypotension Likely due to low blood pressure and possible dehydration, exacerbated by Triamterene. - Ordered blood work for blood volume, electrolytes, kidney function, and liver enzymes. - Advised hydration with [employer] water  or sugar-free Gatorade. - Instructed daily blood pressure monitoring and reporting via MyChart or call on friday morning - Advised to avoid diuretics if possible.  Dyspnea Chronic dyspnea possibly related to heart failure, with left lower extremity edema and shortness of breath. EKG shows sinus rhythm with improved T wave changes. - Ordered chest x-ray stat - Ordered BNP test stat - Ordered troponin .(stat) - Advised emergency department visit if troponin test is positive. Will follow and notify you of results.   Headache syndrome secondary to chronic sinusitis Chronic sinusitis with frontal headache improved with antibiotics. Nasal discharge resolved with Augmentin . - Continue current antibiotic treatment as prescribed by allergist.  Left lower extremity edema and pain Chronic edema and pain, more pronounced on the left side, with recent exacerbation. Compression stockings and leg elevation provide relief. - Ordered ultrasound of the left lower extremity to assess for swelling and calf pain. - Advised continued use of compression stockings and leg elevation.  Evaluation for heart failure Due to chronic dyspnea and left lower extremity edema. - Ordered BNP test. - Ordered troponin test. - Ordered chest x-ray.  Prediabetes Blood sugar levels in the prediabetic range. - Advised to consume sugar-free beverages to manage blood sugar levels.  Follow up date to be determined after lab review

## 2024-06-21 ENCOUNTER — Encounter: Payer: Self-pay | Admitting: Medical

## 2024-06-23 NOTE — Progress Notes (Unsigned)
 [facility] [address] [place], [place] [address] [phone]  Date:  06/25/2024   Name:  [name]   DOB:  July 10, 1956   MRN:  [id number]  PCP:  Kristen Harlene LABOR, MD    Chief Complaint: No chief complaint on file.   History of Present Illness:  [name] is a 68 y.o. very pleasant female patient who presents with the following:  Primary patient of my partner Dr Kristen seen today with concern of dizziness.  She was seen by my other partner [name] on 06/19/2024 Dizziness and orthostatic hypotension Likely due to low blood pressure and possible dehydration, exacerbated by Triamterene. - Ordered blood work for blood volume, electrolytes, kidney function, and liver enzymes. - Advised hydration with [employer] water  or sugar-free Gatorade. - Instructed daily blood pressure monitoring and reporting via MyChart or call on friday morning - Advised to avoid diuretics if possible.   Dyspnea Chronic dyspnea possibly related to heart failure, with left lower extremity edema and shortness of breath. EKG shows sinus rhythm with improved T wave changes. - Ordered chest x-ray stat - Ordered BNP test stat - Ordered troponin .(stat) - Advised emergency department visit if troponin test is positive. Will follow and notify you of results.    Headache syndrome secondary to chronic sinusitis Chronic sinusitis with frontal headache improved with antibiotics. Nasal discharge resolved with Augmentin . - Continue current antibiotic treatment as prescribed by allergist.   Left lower extremity edema and pain Chronic edema and pain, more pronounced on the left side, with recent exacerbation. Compression stockings and leg elevation provide relief. - Ordered ultrasound of the left lower extremity to assess for swelling and calf pain. - Advised continued use of compression stockings and leg elevation.   Evaluation for  heart failure Due to chronic dyspnea and left lower extremity edema. - Ordered BNP test. - Ordered troponin test. - Ordered chest x-ray.  - It looks like her workup per Kristen was basically benign  Discussed the use of AI scribe software for clinical note transcription with the patient, who gave verbal consent to proceed.  History of Present Illness     Patient Active Problem List   Diagnosis Date Noted   Osteopenia 04/01/2024   Tinnitus, right 05/11/2022   Light-headed feeling 05/11/2022   MDD (major depressive disorder), recurrent, severe, with psychosis (HCC) 05/25/2021   GAD (generalized anxiety disorder) 05/19/2021   PTSD (post-traumatic stress disorder) 05/19/2021   Adjustment disorder with mixed anxiety and depressed mood 05/19/2021   Dense breast tissue 06/24/2020   Tinea corporis 04/26/2017   Muscle cramp 03/20/2017   Hyperglycemia 04/26/2015   Arterial hypotension 04/26/2015   Postmenopausal estrogen deficiency 04/26/2015   Other fatigue 04/26/2015   Myalgia 02/15/2015   Pain in joint, shoulder region 01/22/2015   Sleep apnea 04/21/2014   AP (abdominal pain) 04/16/2014   Aortic insufficiency 12/19/2013   Atypical chest pain 08/26/2013   Urinary frequency 08/26/2013   GERD (gastroesophageal reflux disease) 08/26/2013   Subacromial bursitis 06/21/2013   Arthritis 06/09/2013   Constipation 05/13/2013   Insomnia 03/11/2013   Perimenopause 03/11/2013   Hyperlipidemia 05/02/2012   Hip pain, left 11/01/2011   RUQ pain 09/20/2011   Onychomycosis 05/09/2011   Depression with anxiety 05/09/2011   Preventative health care 05/09/2011   Low back pain 05/09/2011   Allergy     History of proteinuria syndrome    Edema of extremities  RLS (restless legs syndrome)    Dyspareunia    Obese     Past Medical History:  Diagnosis Date   Acute sinusitis 03/27/2013   Allergy     seasonal   Anxiety    Arthritis 06/09/2013   Depression    Dyspareunia    Edema  extremities    Encounter for preventative adult health care exam with abnormal findings 04/21/2014   GERD (gastroesophageal reflux disease)    Hip pain, left 11/01/2011   History of proteinuria syndrome    Hyperlipidemia    Insomnia 03/11/2013   Low back pain 05/09/2011   Lymphadenitis 06/16/2012   Myalgia and myositis 02/15/2015   Obese    Onychomycosis 05/09/2011   Oral lesion 08/26/2013   Otitis externa 06/23/2012   left   Perimenopause 03/11/2013   RLS (restless legs syndrome)    RUQ pain 09/20/2011   Sleep apnea 04/21/2014   Tinea corporis 04/26/2017   Unspecified constipation 05/13/2013   Urinary frequency 08/26/2013    Past Surgical History:  Procedure Laterality Date   BACK SURGERY  2006   low, discectomy for herniated disc   BLADDER SUSPENSION N/A 11/07/2012   Procedure: [facility];  Surgeon: Norleen JINNY Seltzer, MD;  Location: [facility];  Service: Urology;  Laterality: N/A;   CARPAL TUNNEL RELEASE Right 2006   CESAREAN SECTION  1993   CYSTOSCOPY N/A 11/07/2012   Procedure: CYSTOSCOPY;  Surgeon: Norleen JINNY Seltzer, MD;  Location: [facility];  Service: Urology;  Laterality: N/A;   TUBAL LIGATION  1993    Social History[1]  Family History  Problem Relation Age of Onset   Cancer Mother 89       thyroid , malignant brain tumor   Arthritis Mother        neck, back   Arthritis Father        back pain   Heart disease Father        stent   Hepatitis Sister        Hepatitis C   Heart disease Sister        stent   Breast cancer Maternal Aunt    Colon polyps Maternal Grandmother    Diabetes Maternal Grandmother    Colon cancer Neg Hx    Rectal cancer Neg Hx    Stomach cancer Neg Hx     Allergies[2]  Medication list has been reviewed and updated.  Medications Ordered Prior to Encounter[3]  Review of Systems:  As per HPI- otherwise negative.   Physical Examination: There were no vitals filed for this visit. There were no vitals  filed for this visit. There is no height or weight on file to calculate BMI. Ideal Body Weight:    GEN: no acute distress. HEENT: Atraumatic, Normocephalic.  Ears and Nose: No external deformity. CV: RRR, No M/G/R. No JVD. No thrill. No extra heart sounds. PULM: CTA B, no wheezes, crackles, rhonchi. No retractions. No resp. distress. No accessory muscle use. ABD: S, NT, ND, +BS. No rebound. No HSM. EXTR: No c/c/e PSYCH: Normally interactive. Conversant.    Assessment and Plan: No diagnosis found.  Assessment & Plan   Signed Harlene Schroeder, MD    [1]  Social History Tobacco Use   Smoking status: Former    Current packs/day: 0.00    Average packs/day: 0.5 packs/day for 20.0 years (10.0 ttl pk-yrs)    Types: Cigarettes    Start date: 07/12/1972    Quit date: 07/12/1992    Years  since quitting: 31.9   Smokeless tobacco: Never  Vaping Use   Vaping status: Never Used  Substance Use Topics   Alcohol use: Not Currently   Drug use: No  [2]  Allergies Allergen Reactions   Elemental Sulfur Diarrhea and Nausea And Vomiting   Carbamazepine Hives   Ceclor [Cefaclor] Other (See Comments)    Tongue swell   Morphine  And Codeine  Itching   Atorvastatin  Other (See Comments)    Leg pain  [3]  Current Outpatient Medications on File Prior to Visit  Medication Sig Dispense Refill   acetaminophen  (TYLENOL ) 500 MG tablet Take 1,000 mg by mouth every 6 (six) hours as needed for headache or mild pain.     albuterol  (VENTOLIN  HFA) 108 (90 Base) MCG/ACT inhaler Inhale 2 puffs into the lungs every 4 (four) hours as needed for wheezing or shortness of breath (coughing fits). 18 g 1   amoxicillin -clavulanate (AUGMENTIN ) 875-125 MG tablet Take 1 tablet by mouth 2 (two) times daily. 20 tablet 0   Ascorbic Acid (VITAMIN C PO) Take 1 tablet by mouth daily.     Bacillus Coagulans-Inulin (PROBIOTIC) 1-250 BILLION-MG CAPS Take by mouth.     baclofen  (LIORESAL ) 20 MG tablet Take 1 tablet (20 mg total)  by mouth at bedtime as needed for muscle spasms. 90 tablet 1   buPROPion  (WELLBUTRIN  XL) 300 MG 24 hr tablet Take 1 tablet (300 mg total) by mouth daily. 90 tablet 1   busPIRone  (BUSPAR ) 7.5 MG tablet TAKE 1 TABLET(7.5 MG) BY MOUTH TWICE DAILY. 180 tablet 1   famotidine  (PEPCID ) 40 MG tablet Take 1 tablet (40 mg total) by mouth at bedtime. 90 tablet 3   fluticasone  (FLONASE ) 50 MCG/ACT nasal spray Place 2 sprays into both nostrils daily as needed for allergies or rhinitis. 16 g 3   hydrOXYzine  (VISTARIL ) 25 MG capsule Take 1 capsule (25 mg total) by mouth daily. 90 capsule 1   LORazepam  (ATIVAN ) 0.5 MG tablet TAKE 1 TABLET BY MOUTH DAILY AS NEEDED FOR SEVERE ANXIETY 30 tablet 3   omeprazole  (PRILOSEC) 40 MG capsule TAKE 1 CAPSULE (40 MG TOTAL) BY MOUTH DAILY. 90 capsule 3   potassium chloride  SA (KLOR-CON  M) 20 MEQ tablet TAKE 1 TABLET BY MOUTH DAILY. TAKE 2ND TABLET DAILY AS NEEDED 180 tablet 0   predniSONE  (DELTASONE ) 10 MG tablet Start prednisone  taper. Prednisone  10mg  tablets - take 2 tablets for 4 days then 1 tablet on day 5. 9 tablet 0   promethazine  (PHENERGAN ) 12.5 MG tablet TAKE 1 TABLET(12.5 MG) BY MOUTH EVERY 8 HOURS AS NEEDED FOR NAUSEA OR VOMITING 30 tablet 5   rOPINIRole  (REQUIP ) 1 MG tablet Take 1 tablet by mouth at bedtime 90 tablet 3   rosuvastatin  (CRESTOR ) 40 MG tablet TAKE 1 TABLET(40 MG) BY MOUTH DAILY 90 tablet 0   torsemide  (DEMADEX ) 20 MG tablet Take 1 tablet (20 mg total) by mouth daily as needed. 90 tablet 1   traZODone  (DESYREL ) 50 MG tablet Take 1-2 tablets (50-100 mg total) by mouth at bedtime as needed for sleep. 120 tablet 1   VITAMIN D  PO Take 1 tablet by mouth daily.     No current facility-administered medications on file prior to visit.

## 2024-06-25 ENCOUNTER — Encounter: Payer: Self-pay | Admitting: Family Medicine

## 2024-06-25 ENCOUNTER — Ambulatory Visit: Admitting: Family Medicine

## 2024-06-25 VITALS — BP 120/70 | HR 73 | Ht 64.0 in | Wt 190.4 lb

## 2024-06-25 DIAGNOSIS — R600 Localized edema: Secondary | ICD-10-CM | POA: Diagnosis not present

## 2024-06-25 MED ORDER — HYDROCHLOROTHIAZIDE 25 MG PO TABS: ORAL_TABLET | ORAL | 3 refills | Status: AC

## 2024-06-25 NOTE — Patient Instructions (Signed)
 It was good to see you today Let's try a different fluid pill for you to use as needed - try to take as infrequently as you are able to keep your swelling under control Try to wear compression socks on your legs as often as possible- these will help Elevating your legs is also a good idea  Let me know if you have any concerns or if you are not doing ok Please do keep an eye on your weight- any rapid gain of more than 2-3 lbs could mean fluid retention- please let us  know if this occurs

## 2024-06-28 DIAGNOSIS — K080 Exfoliation of teeth due to systemic causes: Secondary | ICD-10-CM | POA: Diagnosis not present

## 2024-07-02 ENCOUNTER — Encounter: Payer: Self-pay | Admitting: Physician Assistant

## 2024-08-06 ENCOUNTER — Ambulatory Visit: Admitting: Physician Assistant

## 2024-08-14 ENCOUNTER — Other Ambulatory Visit: Payer: Self-pay | Admitting: Family Medicine

## 2024-08-14 DIAGNOSIS — R252 Cramp and spasm: Secondary | ICD-10-CM

## 2024-08-16 ENCOUNTER — Ambulatory Visit: Admitting: Family Medicine

## 2024-08-29 ENCOUNTER — Ambulatory Visit: Admitting: Physician Assistant

## 2024-10-16 ENCOUNTER — Ambulatory Visit: Admitting: Allergy

## 2025-01-04 ENCOUNTER — Encounter: Admitting: Student

## 2025-04-11 ENCOUNTER — Encounter: Admitting: Student

## 2025-04-11 ENCOUNTER — Encounter: Admitting: Family Medicine

## 2025-04-23 ENCOUNTER — Ambulatory Visit
